# Patient Record
Sex: Female | Born: 1950 | Race: Black or African American | Hispanic: No | Smoking: Former smoker
Health system: Southern US, Community
[De-identification: ages and names within clinical notes are randomized; demographics above are authoritative.]

## PROBLEM LIST (undated history)

## (undated) DIAGNOSIS — K449 Diaphragmatic hernia without obstruction or gangrene: Secondary | ICD-10-CM

## (undated) DIAGNOSIS — R600 Localized edema: Secondary | ICD-10-CM

## (undated) DIAGNOSIS — I10 Essential (primary) hypertension: Secondary | ICD-10-CM

## (undated) DIAGNOSIS — K219 Gastro-esophageal reflux disease without esophagitis: Secondary | ICD-10-CM

## (undated) DIAGNOSIS — Z8679 Personal history of other diseases of the circulatory system: Secondary | ICD-10-CM

## (undated) DIAGNOSIS — G459 Transient cerebral ischemic attack, unspecified: Secondary | ICD-10-CM

## (undated) DIAGNOSIS — G4733 Obstructive sleep apnea (adult) (pediatric): Secondary | ICD-10-CM

## (undated) DIAGNOSIS — H269 Unspecified cataract: Secondary | ICD-10-CM

## (undated) DIAGNOSIS — T7840XA Allergy, unspecified, initial encounter: Secondary | ICD-10-CM

## (undated) DIAGNOSIS — N3289 Other specified disorders of bladder: Secondary | ICD-10-CM

## (undated) DIAGNOSIS — M1990 Unspecified osteoarthritis, unspecified site: Secondary | ICD-10-CM

## (undated) DIAGNOSIS — G4730 Sleep apnea, unspecified: Secondary | ICD-10-CM

## (undated) DIAGNOSIS — H409 Unspecified glaucoma: Secondary | ICD-10-CM

## (undated) DIAGNOSIS — H209 Unspecified iridocyclitis: Secondary | ICD-10-CM

## (undated) DIAGNOSIS — D649 Anemia, unspecified: Secondary | ICD-10-CM

## (undated) HISTORY — DX: Gastro-esophageal reflux disease without esophagitis: K21.9

## (undated) HISTORY — DX: Transient cerebral ischemic attack, unspecified: G45.9

## (undated) HISTORY — DX: Unspecified glaucoma: H40.9

## (undated) HISTORY — DX: Personal history of other diseases of the circulatory system: Z86.79

## (undated) HISTORY — DX: Essential (primary) hypertension: I10

## (undated) HISTORY — DX: Obstructive sleep apnea (adult) (pediatric): G47.33

## (undated) HISTORY — DX: Other specified disorders of bladder: N32.89

## (undated) HISTORY — PX: TUBAL LIGATION: SHX77

## (undated) HISTORY — DX: Sleep apnea, unspecified: G47.30

## (undated) HISTORY — DX: Unspecified osteoarthritis, unspecified site: M19.90

## (undated) HISTORY — DX: Allergy, unspecified, initial encounter: T78.40XA

## (undated) HISTORY — DX: Localized edema: R60.0

## (undated) HISTORY — DX: Diaphragmatic hernia without obstruction or gangrene: K44.9

## (undated) HISTORY — DX: Unspecified cataract: H26.9

## (undated) HISTORY — DX: Anemia, unspecified: D64.9

## (undated) HISTORY — PX: OTHER SURGICAL HISTORY: SHX169

---

## 2008-12-10 ENCOUNTER — Ambulatory Visit: Payer: Self-pay | Admitting: Family Medicine

## 2008-12-10 DIAGNOSIS — R0609 Other forms of dyspnea: Secondary | ICD-10-CM | POA: Insufficient documentation

## 2008-12-10 DIAGNOSIS — M1990 Unspecified osteoarthritis, unspecified site: Secondary | ICD-10-CM | POA: Insufficient documentation

## 2008-12-10 DIAGNOSIS — IMO0001 Reserved for inherently not codable concepts without codable children: Secondary | ICD-10-CM | POA: Insufficient documentation

## 2008-12-10 DIAGNOSIS — Z8679 Personal history of other diseases of the circulatory system: Secondary | ICD-10-CM

## 2008-12-10 DIAGNOSIS — R32 Unspecified urinary incontinence: Secondary | ICD-10-CM | POA: Insufficient documentation

## 2008-12-10 DIAGNOSIS — I10 Essential (primary) hypertension: Secondary | ICD-10-CM | POA: Insufficient documentation

## 2008-12-10 DIAGNOSIS — E1165 Type 2 diabetes mellitus with hyperglycemia: Secondary | ICD-10-CM

## 2008-12-10 DIAGNOSIS — R0989 Other specified symptoms and signs involving the circulatory and respiratory systems: Secondary | ICD-10-CM

## 2008-12-10 DIAGNOSIS — I7300 Raynaud's syndrome without gangrene: Secondary | ICD-10-CM | POA: Insufficient documentation

## 2008-12-10 HISTORY — DX: Personal history of other diseases of the circulatory system: Z86.79

## 2008-12-10 LAB — CONVERTED CEMR LAB: Creatinine,U: 300 mg/dL

## 2008-12-13 ENCOUNTER — Encounter: Payer: Self-pay | Admitting: Family Medicine

## 2008-12-14 LAB — CONVERTED CEMR LAB
AST: 15 units/L (ref 0–37)
Albumin: 4 g/dL (ref 3.5–5.2)
BUN: 23 mg/dL (ref 6–23)
CO2: 27 meq/L (ref 19–32)
Calcium: 9.8 mg/dL (ref 8.4–10.5)
Chloride: 99 meq/L (ref 96–112)
Cholesterol: 137 mg/dL (ref 0–200)
HDL: 46 mg/dL (ref >39)
Potassium: 4.6 meq/L (ref 3.5–5.3)
TSH: 2.232 microintl units/mL (ref 0.350–4.500)

## 2009-01-21 ENCOUNTER — Ambulatory Visit: Payer: Self-pay | Admitting: Family Medicine

## 2009-01-21 LAB — CONVERTED CEMR LAB: Hgb A1c MFr Bld: 9 %

## 2009-01-27 ENCOUNTER — Telehealth: Payer: Self-pay | Admitting: Family Medicine

## 2009-02-10 ENCOUNTER — Ambulatory Visit: Payer: Self-pay | Admitting: Family Medicine

## 2009-02-13 ENCOUNTER — Encounter: Payer: Self-pay | Admitting: Family Medicine

## 2009-02-15 ENCOUNTER — Telehealth: Payer: Self-pay | Admitting: Family Medicine

## 2009-02-21 ENCOUNTER — Telehealth: Payer: Self-pay | Admitting: Family Medicine

## 2009-03-14 ENCOUNTER — Ambulatory Visit: Payer: Self-pay | Admitting: Family Medicine

## 2009-03-14 DIAGNOSIS — K219 Gastro-esophageal reflux disease without esophagitis: Secondary | ICD-10-CM | POA: Insufficient documentation

## 2009-03-28 ENCOUNTER — Telehealth: Payer: Self-pay | Admitting: Family Medicine

## 2009-03-28 ENCOUNTER — Encounter: Payer: Self-pay | Admitting: Family Medicine

## 2009-04-13 ENCOUNTER — Ambulatory Visit: Payer: Self-pay | Admitting: Family Medicine

## 2009-04-13 DIAGNOSIS — G4730 Sleep apnea, unspecified: Secondary | ICD-10-CM | POA: Insufficient documentation

## 2009-04-25 ENCOUNTER — Ambulatory Visit: Payer: Self-pay | Admitting: Family Medicine

## 2009-05-22 ENCOUNTER — Encounter: Payer: Self-pay | Admitting: Family Medicine

## 2009-06-08 ENCOUNTER — Encounter: Payer: Self-pay | Admitting: Family Medicine

## 2009-07-24 ENCOUNTER — Encounter: Payer: Self-pay | Admitting: Family Medicine

## 2009-09-12 ENCOUNTER — Ambulatory Visit: Payer: Self-pay | Admitting: Family Medicine

## 2009-09-12 DIAGNOSIS — M79609 Pain in unspecified limb: Secondary | ICD-10-CM | POA: Insufficient documentation

## 2009-10-05 ENCOUNTER — Ambulatory Visit: Payer: Self-pay | Admitting: Family Medicine

## 2009-10-06 LAB — CONVERTED CEMR LAB
AST: 17 units/L (ref 0–37)
Albumin: 4.2 g/dL (ref 3.5–5.2)
Alkaline Phosphatase: 95 units/L (ref 39–117)
Chloride: 97 meq/L (ref 96–112)
Glucose, Bld: 257 mg/dL — ABNORMAL HIGH (ref 70–99)
Potassium: 3.8 meq/L (ref 3.5–5.3)
Sodium: 139 meq/L (ref 135–145)
Total Protein: 6.9 g/dL (ref 6.0–8.3)

## 2009-10-07 ENCOUNTER — Telehealth (INDEPENDENT_AMBULATORY_CARE_PROVIDER_SITE_OTHER): Payer: Self-pay | Admitting: *Deleted

## 2009-10-10 ENCOUNTER — Telehealth: Payer: Self-pay | Admitting: Family Medicine

## 2009-10-13 ENCOUNTER — Ambulatory Visit: Payer: Self-pay | Admitting: Family Medicine

## 2009-10-19 ENCOUNTER — Encounter: Admission: RE | Admit: 2009-10-19 | Discharge: 2009-12-08 | Payer: Self-pay | Admitting: Family Medicine

## 2009-10-27 ENCOUNTER — Encounter: Payer: Self-pay | Admitting: Family Medicine

## 2009-11-11 ENCOUNTER — Ambulatory Visit: Payer: Self-pay | Admitting: Family Medicine

## 2009-11-24 ENCOUNTER — Encounter: Payer: Self-pay | Admitting: Family Medicine

## 2009-11-29 ENCOUNTER — Encounter: Payer: Self-pay | Admitting: Family Medicine

## 2009-11-30 ENCOUNTER — Encounter: Payer: Self-pay | Admitting: Family Medicine

## 2009-12-16 ENCOUNTER — Encounter: Payer: Self-pay | Admitting: Family Medicine

## 2009-12-16 ENCOUNTER — Telehealth (INDEPENDENT_AMBULATORY_CARE_PROVIDER_SITE_OTHER): Payer: Self-pay | Admitting: *Deleted

## 2010-01-20 ENCOUNTER — Telehealth: Payer: Self-pay | Admitting: Family Medicine

## 2010-08-01 NOTE — Miscellaneous (Signed)
Summary: PT Initial Summary/[facility]  PT Initial Summary/[facility]   Imported By: Lanelle Bal 11/07/2009 08:10:16  _____________________________________________________________________  External Attachment:    Type:   Image     Comment:   External Document

## 2010-08-01 NOTE — Progress Notes (Signed)
Summary: Leg pain and sugar med  Phone Note Call from Patient Call back at Home Phone [phone]   Caller: Patient Call For: [name] MD Summary of Call: Pt calls and was told to stop the Victoza. Done internet search and said found that a side effect of the Onglyza is muscle weakness and wondered if could stop this and start back the Victoza because she says she is bed ridden due to the pain in her legs Initial call taken by: Kathlene November,  October 07, 2009 8:57 AM  Follow-up for Phone Call        Yes can stop the onglyza but needs to use an insuln pen.  Increase the lantus to 30 units.  Let me know if not starting to feel better by Monday.  Follow-up by: [name] MD,  October 07, 2009 9:01 AM  Additional Follow-up for Phone Call Additional follow up Details #1::        Pt aware.  Additional Follow-up by: Payton Spark CMA,  October 07, 2009 10:01 AM

## 2010-08-01 NOTE — Assessment & Plan Note (Signed)
Summary: f/u DM/ obesity   Vital Signs:  Patient profile:   60 year old female Height:      61.1 inches Weight:      363 pounds BMI:     68.61 O2 Sat:      99 % on Room air Pulse rate:   90 / minute BP sitting:   107 / 64  (left arm) Cuff size:   large  Vitals Entered By: Payton Spark CMA (Nov 11, 2009 1:08 PM)  O2 Flow:  Room air CC: F/U DM.    Primary Care Provider:  [name] MD  CC:  F/U DM. Marland Kitchen  History of Present Illness: 60 yo AAF presents for f/u DM and Physical therapy.  She is now able to stand at the sink longer and now has a shower chair.  She does have more muscle soreness.  She is still in PT for 3 more wks.  She admits to a poor diet.  She is on glyburide two times a day and metformin 2 x day.  She is using Lantus 33 units in the AM.  Her AM fastings are from 90-200 depending on what she has eaten.  She is not interested in seeing a nutritionist.  Her A1C and U micro are UTD.  she is due to schedule her mammogram and her eye exam.  Declined me setting these up.  She needs another set of FMLAs filled out.       Current Medications (verified): 1)  Glyburide Micronized 6 Mg Tabs (Glyburide Micronized) .... Take 1 Tablet By Mouth Two Times A Day 2)  Plavix 75 Mg Tabs (Clopidogrel Bisulfate) .... Take One Tablet By Mouth Once A Day 3)  Lisinopril 10 Mg Tabs (Lisinopril) .... Take One Tablet By Mouth Daily 4)  Metformin Hcl 1000 Mg Tabs (Metformin Hcl) .... Take One Tablet By Mouth Twice A Day 5)  Lipitor 10 Mg Tabs (Atorvastatin Calcium) .... Take One Tablet By Mouth At Bedtime 6)  Aspirin 325 Mg Tabs (Aspirin) .... Take One Tablet By Mouth Oncea Day 7)  Iron 325 (65 Fe) Mg Tabs (Ferrous Sulfate) .... Take One Tablet By Mouth Once Ad Ay 8)  Fish Oil 1000 Mg Caps (Omega-3 Fatty Acids) .... Take One Tablet By Mouth Once A Day 9)  Zyrtec Allergy 10 Mg Tabs (Cetirizine Hcl) .... Take One Tablet By Mouth Once Ad Ay 10)  Indapamide 2.5 Mg Tabs (Indapamide) ....  Take One Tablet By Mouth Once Ad Ay 11)  Needles For [employer]. .... #10 12)  Dexilant 60 Mg Cpdr (Dexlansoprazole) .... Take 1 Tablet By Mouth Once A Day 13)  Cyclobenzaprine Hcl 10 Mg Tabs (Cyclobenzaprine Hcl) .... 1/2 - 1 Tab By Mouth Up To 3 X A Day For Muscle Spasm. 14)  Lantus Solostar 100 Unit/ml Soln (Insulin Glargine) .... 35 Units Daily [place]  Allergies (verified): 1)  Actos (Pioglitazone Hcl)  Past History:  Past Medical History: Reviewed history from 04/13/2009 and no changes required. 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  - has a haital hernia and delayed gastric empyting.   Reynauds. Pedal edema.   Hx of TIA x 2.  - Sees Dr. 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   hx C5 disc disease - occ numbness tingling in both arms.  OSA - CPAP .   Social History: Reviewed history from 04/13/2009 and no changes required. 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 Married  to Melba with 2 adult kids.  Foster parent.  Former Smoker Alcohol use-yes Drug use-yes Regular exercise-no  Review of Systems      See HPI  Physical Exam  General:  morbidly obese AAF, pleasant.  ambulating with cane Head:  normocephalic and atraumatic.   Eyes:  pupils equal, pupils round, and pupils reactive to light.   Mouth:  no gingival abnormalities and pharynx pink and moist.   Neck:  no masses.   Lungs:  Normal respiratory effort, chest expands symmetrically. Lungs are clear to auscultation, no crackles or wheezes. Heart:  Normal rate and regular rhythm. S1 and S2 normal without gallop, murmur, click, rub or other extra sounds. Extremities:  1+ non pitting ankle edema bilat Skin:  color normal.   Psych:  good eye contact, not anxious appearing, and flat affect.     Impression & Recommendations:  Problem # 1:  OBESITY, MORBID (ICD-278.01) BMI 68 c/w super morbid obesity. She is regaining a little more mobility with PT, so will continue  for another 3 wks.  Will complete her FMLA to keep her out for another 4 wks. She has co-morbidities of HTN, OSA, TIA, T2DM, high cholesterol, and joint pain all weight related. Pt appears to be in the pre-contemplative phase of behavioral change when I offered nutrition referral and dietary changes.  Problem # 2:  DIABETES MELLITUS, UNCONTROLLED (ICD-250.02) She is not due for an A1C or Umicro today.  Labs are UTD.  Changed Lantus in EMR from 35 to 33 units/ day and continue current oral meds.  Fluctuations in AM glucose are related to dietary non adherance.  F/U in 2 mos and will update her fasting labs/ A1C. Her updated medication list for this problem includes:    Glyburide Micronized 6 Mg Tabs (Glyburide micronized) .Marland Kitchen... Take 1 tablet by mouth two times a day    Lisinopril 10 Mg Tabs (Lisinopril) .Marland Kitchen... Take one tablet by mouth daily    Metformin Hcl 1000 Mg Tabs (Metformin hcl) .Marland Kitchen... Take one tablet by mouth twice a day    Aspirin 325 Mg Tabs (Aspirin) .Marland Kitchen... Take one tablet by mouth oncea day    Lantus Solostar 100 Unit/ml Soln (Insulin glargine) .Marland Kitchen... 33  units [place] injection once a day  Labs Reviewed: Creat: 0.73 (10/05/2009)   Microalbumin: 10 (12/10/2008) Reviewed HgBA1c results: 7.5 (09/12/2009)  7.5 (04/25/2009)  Complete Medication List: 1)  Glyburide Micronized 6 Mg Tabs (Glyburide micronized) .... Take 1 tablet by mouth two times a day 2)  Plavix 75 Mg Tabs (Clopidogrel bisulfate) .... Take one tablet by mouth once a day 3)  Lisinopril 10 Mg Tabs (Lisinopril) .... Take one tablet by mouth daily 4)  Metformin Hcl 1000 Mg Tabs (Metformin hcl) .... Take one tablet by mouth twice a day 5)  Lipitor 10 Mg Tabs (Atorvastatin calcium) .... Take one tablet by mouth at bedtime 6)  Aspirin 325 Mg Tabs (Aspirin) .... Take one tablet by mouth oncea day 7)  Iron 325 (65 Fe) Mg Tabs (Ferrous sulfate) .... Take one tablet by mouth once ad ay 8)  Fish Oil 1000 Mg Caps (Omega-3 fatty acids) ....  Take one tablet by mouth once a day 9)  Zyrtec Allergy 10 Mg Tabs (Cetirizine hcl) .... Take one tablet by mouth once ad ay 10)  Indapamide 2.5 Mg Tabs (Indapamide) .... Take one tablet by mouth once ad ay 11)  Needles For [employer].  .... #10 12)  Dexilant 60 Mg Cpdr (Dexlansoprazole) .Marland KitchenMarland KitchenMarland Kitchen  Take 1 tablet by mouth once a day 13)  Cyclobenzaprine Hcl 10 Mg Tabs (Cyclobenzaprine hcl) .... 1/2 - 1 tab by mouth up to 3 x a day for muscle spasm. 14)  Lantus Solostar 100 Unit/ml Soln (Insulin glargine) .... 33  units [place] injection once a day  Patient Instructions: 1)  I will send in your FMLA paper next wk for 4 more wks. 2)  Keep up with physical therapy and work on diabetic diet. 3)  AM fasting sugar goal is 80-120.  2 hr after dinner goal is <150. 4)  Call for your mammogram and diabetic eye exam. 5)  F/U Diabetes/ leg pain and fasting labs in 2 mos.

## 2010-08-01 NOTE — Progress Notes (Signed)
Summary: Update  Phone Note Call from Patient Call back at Home Phone [phone]   Caller: Patient Call For: [name] MD Summary of Call: Pt wants to know how long it will be before she gets her strenghth back in legs- is getting better but only 50% better.Still taking the muscle relaxers. Has appt Thursday morning withyou. The Lantus she is on her FBS 170 this morning Initial call taken by: Kathlene November,  October 10, 2009 2:40 PM  Follow-up for Phone Call        May take another week to get better. Increase Lantus to 35 units  Follow-up by: [name] MD,  October 10, 2009 3:06 PM  Additional Follow-up for Phone Call Additional follow up Details #1::        Pt notiifeed Additional Follow-up by: Kathlene November,  October 10, 2009 3:48 PM

## 2010-08-01 NOTE — Miscellaneous (Signed)
Summary: PT Discharge/[facility]  PT Discharge/[facility]   Imported By: Lanelle Bal 02/24/2010 11:20:36  _____________________________________________________________________  External Attachment:    Type:   Image     Comment:   External Document

## 2010-08-01 NOTE — Progress Notes (Signed)
Summary: lantus pen needles  Phone Note Call from Patient Call back at Home Phone [phone]   Caller: Patient Call For: Natalie Wright Summary of Call: Pt calls and states she needs the needles for the Lantus pen- small quantity because she is waiting on her mail order to come in but used her last one this morning. Needs this sent to [facility] in [place] Initial call taken by: Kathlene November,  January 20, 2010 9:32 AM    New/Updated Medications: BD PEN NEEDLE ULTRAFINE 29G X 12.7MM MISC (INSULIN PEN NEEDLE) use daily as directed Prescriptions: BD PEN NEEDLE ULTRAFINE 29G X 12.7MM MISC (INSULIN PEN NEEDLE) use daily as directed  #14 x 0   Entered and Authorized by:   Seymour Bars DO   Signed by:   Seymour Bars DO on 01/20/2010   Method used:   Electronically to        [facility]* (retail)       [address] [place], [place]  [address]       Ph: [phone]       Fax: [phone]   RxID:   [id number]

## 2010-08-01 NOTE — Letter (Signed)
Summary: [facility]  [facility]   Imported By: Lanelle Bal 12/06/2009 12:03:45  _____________________________________________________________________  External Attachment:    Type:   Image     Comment:   External Document  Appended Document: [facility]   Diabetes Management History:      The patient is a 60 years old female who comes in for evaluation of DM Type 2.  She is (or has been) enrolled in the "Diabetic Education Program".  She is checking home blood sugars.  She says that she is not exercising regularly.    Diabetes Management Exam:    Eye Exam:       Eye Exam done elsewhere          Date: 11/29/2009          Results: normal          Done by: [facility]

## 2010-08-01 NOTE — Miscellaneous (Signed)
Summary: mammogram normal  Clinical Lists Changes  Observations: Added new observation of MAMMO DUE: 12/2010 (11/24/2009 11:57) Added new observation of MAMMRECACT: Screening mammogram in 1 year.    (11/23/2009 11:57) Added new observation of MAMMOGRAM: Location: [facility].   No specific mammographic evidence of malignancy.  No significant changes compared to previous study.  Assessment: BIRADS 1.  (11/23/2009 11:57)      Mammogram  Procedure date:  11/23/2009  Findings:      Location: [facility].   No specific mammographic evidence of malignancy.  No significant changes compared to previous study.  Assessment: BIRADS 1.   Comments:      Screening mammogram in 1 year.     Procedures Next Due Date:    Mammogram: 12/2010   Mammogram  Procedure date:  11/23/2009  Findings:      Location: [facility].   No specific mammographic evidence of malignancy.  No significant changes compared to previous study.  Assessment: BIRADS 1.   Comments:      Screening mammogram in 1 year.     Procedures Next Due Date:    Mammogram: 12/2010

## 2010-08-01 NOTE — Progress Notes (Signed)
Summary: Work Statistician Note Call from Patient   Caller: Patient Summary of Call: Dr. Cathey Endow filled out FMLA papers for Pt to return to work [employer] 12/12/09. Since returning to work, Pt's HR dept is requiring an additional letter stating Pt is released back to work effective 12/12/09. Please advise. Initial call taken by: Payton Spark CMA,  December 16, 2009 12:59 PM  Follow-up for Phone Call        will write note explaining that per FMLA papers pt is cleared to return to work.  since there is no copy of paperwork for me to refer to in order to elaborate that will have to be sufficient (unless they want to wait for Dr Cathey Endow) Follow-up by: Neena Rhymes MD,  December 16, 2009 1:07 PM  Additional Follow-up for Phone Call Additional follow up Details #1::        Letter faxed Additional Follow-up by: Payton Spark CMA,  December 16, 2009 1:15 PM

## 2010-08-01 NOTE — Miscellaneous (Signed)
Summary: PT Progress Note/[facility]  PT Progress Note/[facility]   Imported By: Lanelle Bal 11/21/2009 12:55:22  _____________________________________________________________________  External Attachment:    Type:   Image     Comment:   External Document

## 2010-08-01 NOTE — Assessment & Plan Note (Signed)
Summary: Leg pain, DM   Vital Signs:  Patient profile:   60 year old female Height:      61.1 inches Weight:      360 pounds Pulse rate:   87 / minute BP sitting:   127 / 64  (left arm) Cuff size:   large  Vitals Entered By: Kathlene November (October 13, 2009 1:38 PM) CC: continues to have leg pain is better per pt   Primary Care Provider:  [name] MD  CC:  continues to have leg pain is better per pt.  History of Present Illness: Overall her legs feel 60% better. Says it got so bad she was unable to get out of bed but now can get out of bed but very painful and taxing.  No inital injury or trauma.  Did stop her onglyza and victoza which both have rare but potential SE of muscle aches.   Pain is mostly in the hips.  In the right leg feels like the muscles are contracted.    Current Medications (verified): 1)  Glyburide Micronized 6 Mg Tabs (Glyburide Micronized) .... Take 1 Tablet By Mouth Two Times A Day 2)  Plavix 75 Mg Tabs (Clopidogrel Bisulfate) .... Take One Tablet By Mouth Once A Day 3)  Lisinopril 10 Mg Tabs (Lisinopril) .... Take One Tablet By Mouth Daily 4)  Metformin Hcl 1000 Mg Tabs (Metformin Hcl) .... Take One Tablet By Mouth Twice A Day 5)  Lipitor 10 Mg Tabs (Atorvastatin Calcium) .... Take One Tablet By Mouth At Bedtime 6)  Aspirin 325 Mg Tabs (Aspirin) .... Take One Tablet By Mouth Oncea Day 7)  Iron 325 (65 Fe) Mg Tabs (Ferrous Sulfate) .... Take One Tablet By Mouth Once Ad Ay 8)  Fish Oil 1000 Mg Caps (Omega-3 Fatty Acids) .... Take One Tablet By Mouth Once A Day 9)  Zyrtec Allergy 10 Mg Tabs (Cetirizine Hcl) .... Take One Tablet By Mouth Once Ad Ay 10)  Indapamide 2.5 Mg Tabs (Indapamide) .... Take One Tablet By Mouth Once Ad Ay 11)  Needles For [employer]. .... #10 12)  Dexilant 60 Mg Cpdr (Dexlansoprazole) .... Take 1 Tablet By Mouth Once A Day 13)  Cyclobenzaprine Hcl 10 Mg Tabs (Cyclobenzaprine Hcl) .... 1/2 - 1 Tab By Mouth Up To 3 X A Day For Muscle  Spasm.  Allergies (verified): 1)  Actos (Pioglitazone Hcl)  Comments:  Nurse/Medical Assistant: The patient's medications and allergies were reviewed with the patient and were updated in the Medication and Allergy Lists. Kathlene November (October 13, 2009 1:39 PM)  Physical Exam  General:  Well-developed,well-nourished,in no acute distress; alert,appropriate and cooperative throughout examination Ears:  Left TM and canal are clear.  Skin:  no rashes.   Psych:  Cognition and judgment appear intact. Alert and cooperative with normal attention span and concentration. No apparent delusions, illusions, hallucinations   Impression & Recommendations:  Problem # 1:  LEG PAIN (ICD-729.5) Will refer to PT to help stretch out the legs. Will also have her hld her lipitor even though has been on this for 2 years and has tolerated itwell. If better in 1-2 weeks then will have her restart her lipitor and see if sxs recure. In the meantime she is 60% improved and is making progress.  F/U if not better in 1-2 weeks.  Orders: Physical Therapy Referral (PT)  Problem # 2:  DIABETES MELLITUS, UNCONTROLLED (ICD-250.02) Doing well on teh Lantus daily. Her fasting sugar was 85 this AM. Decrease  dose to 32-33 units daily and montiro carefully. F/U in one month.  90 days supply sent o [facility].  The following medications were removed from the medication list:    Victoza 18 Mg/66ml Soln (Liraglutide) .Marland Kitchen... 1.2mg  subcutaneously once daily    Onglyza 5 Mg Tabs (Saxagliptin hcl) .Marland Kitchen... Take 1 tablet by mouth once a day Her updated medication list for this problem includes:    Glyburide Micronized 6 Mg Tabs (Glyburide micronized) .Marland Kitchen... Take 1 tablet by mouth two times a day    Lisinopril 10 Mg Tabs (Lisinopril) .Marland Kitchen... Take one tablet by mouth daily    Metformin Hcl 1000 Mg Tabs (Metformin hcl) .Marland Kitchen... Take one tablet by mouth twice a day    Aspirin 325 Mg Tabs (Aspirin) .Marland Kitchen... Take one tablet by mouth oncea day    Lantus  Solostar 100 Unit/ml Soln (Insulin glargine) .Marland KitchenMarland KitchenMarland KitchenMarland Kitchen 35 units daily [place]  Complete Medication List: 1)  Glyburide Micronized 6 Mg Tabs (Glyburide micronized) .... Take 1 tablet by mouth two times a day 2)  Plavix 75 Mg Tabs (Clopidogrel bisulfate) .... Take one tablet by mouth once a day 3)  Lisinopril 10 Mg Tabs (Lisinopril) .... Take one tablet by mouth daily 4)  Metformin Hcl 1000 Mg Tabs (Metformin hcl) .... Take one tablet by mouth twice a day 5)  Lipitor 10 Mg Tabs (Atorvastatin calcium) .... Take one tablet by mouth at bedtime 6)  Aspirin 325 Mg Tabs (Aspirin) .... Take one tablet by mouth oncea day 7)  Iron 325 (65 Fe) Mg Tabs (Ferrous sulfate) .... Take one tablet by mouth once ad ay 8)  Fish Oil 1000 Mg Caps (Omega-3 fatty acids) .... Take one tablet by mouth once a day 9)  Zyrtec Allergy 10 Mg Tabs (Cetirizine hcl) .... Take one tablet by mouth once ad ay 10)  Indapamide 2.5 Mg Tabs (Indapamide) .... Take one tablet by mouth once ad ay 11)  Needles For [employer].  .... #10 12)  Dexilant 60 Mg Cpdr (Dexlansoprazole) .... Take 1 tablet by mouth once a day 13)  Cyclobenzaprine Hcl 10 Mg Tabs (Cyclobenzaprine hcl) .... 1/2 - 1 tab by mouth up to 3 x a day for muscle spasm. 14)  Lantus Solostar 100 Unit/ml Soln (Insulin glargine) .... 35 units daily [place]  Patient Instructions: 1)  Fasting sugars should be between 80-130 fasting. If 2 hours after meal then should be under 160 2)  Drop the lantus back down to 32 or 33 units.   3)  Hold the lipitor for now. If better in1 -2 weeks then can restart the Lipitor and see how you do.  4)  Please schedule a follow-up appointment in 1 month for diabetes Prescriptions: LANTUS SOLOSTAR 100 UNIT/ML SOLN (INSULIN GLARGINE) 35 units daily [place]  #90 day suppl x 0   Entered and Authorized by:   [name] MD   Signed by:   [name] MD on 10/13/2009   Method used:   Electronically to        [facility]* (mail-order)             ,           Ph: [phone]       Fax: [phone]   RxID:   [id number]

## 2010-08-01 NOTE — Assessment & Plan Note (Signed)
Summary: DIABETIC FU,  right calf pain.    Vital Signs:  Patient profile:   60 year old female Height:      61.1 inches Weight:      369 pounds BMI:     69.75 O2 Sat:      100 % on Room air Pulse rate:   83 / minute BP sitting:   145 / 73  (left arm) Cuff size:   large  Vitals Entered By: Payton Spark CMA (September 12, 2009 8:45 AM)  O2 Flow:  Room air  Serial Vital Signs/Assessments:  Time      Position  BP       Pulse  Resp  Temp     By 9:44 AM             136/73                         [name] MD  CC: F/U DM   Primary Care Provider:  [name] MD  CC:  F/U DM.  History of Present Illness: Has lost 4 pounds. Doing well. exercising more regularly. Has not had her eye exam this year.   pain in the right lower leg for about 3 weeks. First started one niht while asleep. Was sudden and sharp and actually screamed out. Lasted about 10 seconds.  Comes and goes every 2-3 days. Sometimes when standing but often happens when asleep. NO swelling. Feels deep in the calf and it tender.    Diabetes Management History:      The patient is a 60 years old female who comes in for evaluation of DM Type 2.  She is (or has been) enrolled in the "Diabetic Education Program".  She states understanding of dietary principles and is following her diet appropriately.  She is checking home blood sugars.  She says that she is not exercising regularly.        Hypoglycemic symptoms are not occurring.  Other comments include: Has  losst another 4 lbs. Has been exercising 5 days a week for 30 min each.  .        There are no symptoms to suggest diabetic complications.  No changes have been made to her treatment plan since last visit.    Current Medications (verified): 1)  Glyburide Micronized 6 Mg Tabs (Glyburide Micronized) .... Take 1 Tablet By Mouth Two Times A Day 2)  Plavix 75 Mg Tabs (Clopidogrel Bisulfate) .... Take One Tablet By Mouth Once A Day 3)  Lisinopril 10 Mg Tabs  (Lisinopril) .... Take One Tablet By Mouth Daily 4)  Metformin Hcl 1000 Mg Tabs (Metformin Hcl) .... Take One Tablet By Mouth Twice A Day 5)  Lipitor 10 Mg Tabs (Atorvastatin Calcium) .... Take One Tablet By Mouth At Bedtime 6)  Aspirin 325 Mg Tabs (Aspirin) .... Take One Tablet By Mouth Oncea Day 7)  Iron 325 (65 Fe) Mg Tabs (Ferrous Sulfate) .... Take One Tablet By Mouth Once Ad Ay 8)  Fish Oil 1000 Mg Caps (Omega-3 Fatty Acids) .... Take One Tablet By Mouth Once A Day 9)  Zyrtec Allergy 10 Mg Tabs (Cetirizine Hcl) .... Take One Tablet By Mouth Once Ad Ay 10)  Lotrisone 1-0.05 % Crea (Clotrimazole-Betamethasone) .... Apply Two Times A Day 11)  Indapamide 2.5 Mg Tabs (Indapamide) .... Take One Tablet By Mouth Once Ad Ay 12)  Victoza 18 Mg/19ml Soln (Liraglutide) .... 1.2mg  Subcutaneously Once Daily 13)  Needles  For [employer]. .... #10 14)  Avandia 4 Mg Tabs (Rosiglitazone Maleate) .... 1/2 Tab By Mouth Two Times A Day 15)  Dexilant 60 Mg Cpdr (Dexlansoprazole) .... Take 1 Tablet By Mouth Once A Day  Allergies (verified): 1)  Actos (Pioglitazone Hcl)  Family History: Reviewed history from 04/25/2009 and no changes required. Father with alcoholism Mother with DM, HTN, glaucoma Father with emphysema, CHF Mother with bone ca Sister with ?utereine cancer  Social History: Reviewed history from 04/13/2009 and no changes required. 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  Foster parent.  Former Smoker Alcohol use-yes Drug use-yes Regular exercise-no  Physical Exam  General:  Well-developed,well-nourished,in no acute distress; alert,appropriate and cooperative throughout examination Lungs:  Normal respiratory effort, chest expands symmetrically. Lungs are clear to auscultation, no crackles or wheezes. Heart:  Normal rate and regular rhythm. S1 and S2 normal without gallop, murmur, click, rub or other extra sounds. Msk:  Right calf is mildly  tender near the base. Right ankle with NROM.  No edema or rash.  Pulses:  Right DP and post tib is 2+.  Skin:  no rashes.   Psych:  Cognition and judgment appear intact. Alert and cooperative with normal attention span and concentration. No apparent delusions, illusions, hallucinations   Impression & Recommendations:  Problem # 1:  DIABETES MELLITUS, UNCONTROLLED (ICD-250.02) Assessment Deteriorated Reminded her to get her eye exam. Will stop the avandia (warned of potential SE and change to onlyza). pt still wants to hold off on insujlin.  Monitor for lows since will continue her sulfonylurea. Call if any concerns. Her A1C was unchanged to day.  The following medications were removed from the medication list:    Avandia 4 Mg Tabs (Rosiglitazone maleate) .Marland Kitchen... 1/2 tab by mouth two times a day Her updated medication list for this problem includes:    Glyburide Micronized 6 Mg Tabs (Glyburide micronized) .Marland Kitchen... Take 1 tablet by mouth two times a day    Lisinopril 10 Mg Tabs (Lisinopril) .Marland Kitchen... Take one tablet by mouth daily    Metformin Hcl 1000 Mg Tabs (Metformin hcl) .Marland Kitchen... Take one tablet by mouth twice a day    Aspirin 325 Mg Tabs (Aspirin) .Marland Kitchen... Take one tablet by mouth oncea day    Victoza 18 Mg/69ml Soln (Liraglutide) .Marland Kitchen... 1.2mg  subcutaneously once daily    Onglyza 5 Mg Tabs (Saxagliptin hcl) .Marland Kitchen... Take 1 tablet by mouth once a day  Orders: Fingerstick (36416) Hemoglobin A1C (83036)  Problem # 2:  ESSENTIAL HYPERTENSION, BENIGN (ICD-401.1) Repeat BP looks great.  Her updated medication list for this problem includes:    Lisinopril 10 Mg Tabs (Lisinopril) .Marland Kitchen... Take one tablet by mouth daily    Indapamide 2.5 Mg Tabs (Indapamide) .Marland Kitchen... Take one tablet by mouth once ad ay  Problem # 3:  LEG PAIN, RIGHT (ICD-729.5) Assessment: New Unlikely DVT based on exam today but will get dopplers to rule it out since her exam is a little difficult.  IN meantime can use ACE wrap for comfort and a  baby ASA once a day for now.  [phone] cell.   Orders: T-*Unlisted Diagnostic X-ray test/procedure (45409)  Complete Medication List: 1)  Glyburide Micronized 6 Mg Tabs (Glyburide micronized) .... Take 1 tablet by mouth two times a day 2)  Plavix 75 Mg Tabs (Clopidogrel bisulfate) .... Take one tablet by mouth once a day 3)  Lisinopril 10 Mg Tabs (Lisinopril) .... Take one tablet by mouth  daily 4)  Metformin Hcl 1000 Mg Tabs (Metformin hcl) .... Take one tablet by mouth twice a day 5)  Lipitor 10 Mg Tabs (Atorvastatin calcium) .... Take one tablet by mouth at bedtime 6)  Aspirin 325 Mg Tabs (Aspirin) .... Take one tablet by mouth oncea day 7)  Iron 325 (65 Fe) Mg Tabs (Ferrous sulfate) .... Take one tablet by mouth once ad ay 8)  Fish Oil 1000 Mg Caps (Omega-3 fatty acids) .... Take one tablet by mouth once a day 9)  Zyrtec Allergy 10 Mg Tabs (Cetirizine hcl) .... Take one tablet by mouth once ad ay 10)  Lotrisone 1-0.05 % Crea (Clotrimazole-betamethasone) .... Apply two times a day 11)  Indapamide 2.5 Mg Tabs (Indapamide) .... Take one tablet by mouth once ad ay 12)  Victoza 18 Mg/58ml Soln (Liraglutide) .... 1.2mg  subcutaneously once daily 13)  Needles For [employer].  .... #10 14)  Dexilant 60 Mg Cpdr (Dexlansoprazole) .... Take 1 tablet by mouth once a day 15)  Onglyza 5 Mg Tabs (Saxagliptin hcl) .... Take 1 tablet by mouth once a day  Patient Instructions: 1)  Please schedule a follow-up appointment in 1 month.  2)  Stop the avandia and start the onglyza once a day.  3)  Call if having lows.  Continue the metfromin, glyburide and victoza.  4)  Remeber to get your yearly eye exam.  Prescriptions: ONGLYZA 5 MG TABS (SAXAGLIPTIN HCL) Take 1 tablet by mouth once a day  #30 x 1   Entered and Authorized by:   [name] MD   Signed by:   [name] MD on 09/12/2009   Method used:   Electronically to        [facility]* (retail)       [address] [place], [place]  [address]       Ph: [phone]       Fax: [phone]   RxID:   [id number]     Appended Document: DIABETIC FU,  right calf pain.      Allergies: 1)  Actos (Pioglitazone Hcl)   Complete Medication List: 1)  Glyburide Micronized 6 Mg Tabs (Glyburide micronized) .... Take 1 tablet by mouth two times a day 2)  Plavix 75 Mg Tabs (Clopidogrel bisulfate) .... Take one tablet by mouth once a day 3)  Lisinopril 10 Mg Tabs (Lisinopril) .... Take one tablet by mouth daily 4)  Metformin Hcl 1000 Mg Tabs (Metformin hcl) .... Take one tablet by mouth twice a day 5)  Lipitor 10 Mg Tabs (Atorvastatin calcium) .... Take one tablet by mouth at bedtime 6)  Aspirin 325 Mg Tabs (Aspirin) .... Take one tablet by mouth oncea day 7)  Iron 325 (65 Fe) Mg Tabs (Ferrous sulfate) .... Take one tablet by mouth once ad ay 8)  Fish Oil 1000 Mg Caps (Omega-3 fatty acids) .... Take one tablet by mouth once a day 9)  Zyrtec Allergy 10 Mg Tabs (Cetirizine hcl) .... Take one tablet by mouth once ad ay 10)  Lotrisone 1-0.05 % Crea (Clotrimazole-betamethasone) .... Apply two times a day 11)  Indapamide 2.5 Mg Tabs (Indapamide) .... Take one tablet by mouth once ad ay 12)  Victoza 18 Mg/46ml Soln (Liraglutide) .... 1.2mg  subcutaneously once daily 13)  Needles For [employer].  .... #10 14)  Dexilant 60 Mg Cpdr (Dexlansoprazole) .... Take 1 tablet by mouth once a day 15)  Onglyza 5 Mg Tabs (Saxagliptin  hcl) .... Take 1 tablet by mouth once a day   Laboratory Results   Blood Tests     HGBA1C: 7.5%   (Normal Range: Non-Diabetic - 3-6%   Control Diabetic - 6-8%)     Appended Document: DIABETIC FU,  right calf pain.  Called pt: LM report neg for DVT. REcommend compression and NSAIDs. If not better in next couple of weeks then consider xray of the lower leg.

## 2010-08-01 NOTE — Assessment & Plan Note (Signed)
Summary: Leg pain, DM   Vital Signs:  Patient profile:   60 year old female Height:      61.1 inches Weight:      363 pounds Temp:     97.6 degrees F oral Pulse rate:   76 / minute BP sitting:   135 / 63  (left arm) Cuff size:   large  Vitals Entered By: Kathlene November (October 05, 2009 10:07 AM) CC: bilateral leg pain- starts at hips and goes down to calves. Feels like muscles contracting and pulling for over 1 week now   Primary Care Provider:  [name] MD  CC:  bilateral leg pain- starts at hips and goes down to calves. Feels like muscles contracting and pulling for over 1 week now.  History of Present Illness: bilateral leg pain- starts at hips and goes down to calves. Feels like muscles contracting and pulling for over 1 week now.  Pain is going down the back of teh legs. It is a "soreness" adn like a congtracted muscles. When stretches out it is worse. Worse in the morning when gets out of bed.  Takes about 1 to 1.5 hours until feels better.  Started a week ago.  Trying to take Tyelnol ARthritis.  Never happened before. BOth legs. No nubmness or tingling. No alleviating sxs.    DM Tolerating the onglyza well and has lost a couple more pounds but her sugars are still running in the 200s.   Current Medications (verified): 1)  Glyburide Micronized 6 Mg Tabs (Glyburide Micronized) .... Take 1 Tablet By Mouth Two Times A Day 2)  Plavix 75 Mg Tabs (Clopidogrel Bisulfate) .... Take One Tablet By Mouth Once A Day 3)  Lisinopril 10 Mg Tabs (Lisinopril) .... Take One Tablet By Mouth Daily 4)  Metformin Hcl 1000 Mg Tabs (Metformin Hcl) .... Take One Tablet By Mouth Twice A Day 5)  Lipitor 10 Mg Tabs (Atorvastatin Calcium) .... Take One Tablet By Mouth At Bedtime 6)  Aspirin 325 Mg Tabs (Aspirin) .... Take One Tablet By Mouth Oncea Day 7)  Iron 325 (65 Fe) Mg Tabs (Ferrous Sulfate) .... Take One Tablet By Mouth Once Ad Ay 8)  Fish Oil 1000 Mg Caps (Omega-3 Fatty Acids) .... Take One  Tablet By Mouth Once A Day 9)  Zyrtec Allergy 10 Mg Tabs (Cetirizine Hcl) .... Take One Tablet By Mouth Once Ad Ay 10)  Indapamide 2.5 Mg Tabs (Indapamide) .... Take One Tablet By Mouth Once Ad Ay 11)  Victoza 18 Mg/10ml Soln (Liraglutide) .... 1.2mg  Subcutaneously Once Daily 12)  Needles For [employer]. .... #10 13)  Dexilant 60 Mg Cpdr (Dexlansoprazole) .... Take 1 Tablet By Mouth Once A Day 14)  Onglyza 5 Mg Tabs (Saxagliptin Hcl) .... Take 1 Tablet By Mouth Once A Day  Allergies (verified): 1)  Actos (Pioglitazone Hcl)  Comments:  Nurse/Medical Assistant: The patient's medications and allergies were reviewed with the patient and were updated in the Medication and Allergy Lists. Kathlene November (October 05, 2009 10:09 AM)  Social History: Reviewed history from 04/13/2009 and no changes required. 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  Former Smoker Alcohol use-yes Drug use-yes Regular exercise-no  Physical Exam  General:  Well-developed,well-nourished,in no acute distress; alert,appropriate and cooperative throughout examination Head:  Normocephalic and atraumatic without obvious abnormalities. No apparent alopecia or balding. Msk:  Legs with sterngth 5/5in the hips, knees, and ankles bilat.  NROM limited by body habitus at the hips, knees and ankles. Not tender with muscle squeeze.  Neurologic:  Patellar and achilles reflex 1+ bilat.  Skin:  no rashes.   Psych:  Cognition and judgment appear intact. Alert and cooperative with normal attention span and concentration. No apparent delusions, illusions, hallucinations   Impression & Recommendations:  Problem # 1:  LEG PAIN (ICD-729.5) Discussed potential DX. She is very sore with movement so I want to make sure not an imblance with her potassium and also check sed rate and CK to make sure no muscle breakdown. Also consider it may be the statin causing her pain.      Orders: T-Comprehensive Metabolic Panel [id number]) T-CK Total [id number]) T-Sed Rate (Automated) ([id number])  Problem # 2:  DIABETES MELLITUS, UNCONTROLLED (ICD-250.02) Sugars still running in the 200s. Start Lantus 15 units daily and keep f/u next week for diabetes f/u and bring in sugar log with her.   Her updated medication list for this problem includes:    Glyburide Micronized 6 Mg Tabs (Glyburide micronized) .Marland Kitchen... Take 1 tablet by mouth two times a day    Lisinopril 10 Mg Tabs (Lisinopril) .Marland Kitchen... Take one tablet by mouth daily    Metformin Hcl 1000 Mg Tabs (Metformin hcl) .Marland Kitchen... Take one tablet by mouth twice a day    Aspirin 325 Mg Tabs (Aspirin) .Marland Kitchen... Take one tablet by mouth oncea day    Victoza 18 Mg/25ml Soln (Liraglutide) .Marland Kitchen... 1.2mg  subcutaneously once daily    Onglyza 5 Mg Tabs (Saxagliptin hcl) .Marland Kitchen... Take 1 tablet by mouth once a day  Complete Medication List: 1)  Glyburide Micronized 6 Mg Tabs (Glyburide micronized) .... Take 1 tablet by mouth two times a day 2)  Plavix 75 Mg Tabs (Clopidogrel bisulfate) .... Take one tablet by mouth once a day 3)  Lisinopril 10 Mg Tabs (Lisinopril) .... Take one tablet by mouth daily 4)  Metformin Hcl 1000 Mg Tabs (Metformin hcl) .... Take one tablet by mouth twice a day 5)  Lipitor 10 Mg Tabs (Atorvastatin calcium) .... Take one tablet by mouth at bedtime 6)  Aspirin 325 Mg Tabs (Aspirin) .... Take one tablet by mouth oncea day 7)  Iron 325 (65 Fe) Mg Tabs (Ferrous sulfate) .... Take one tablet by mouth once ad ay 8)  Fish Oil 1000 Mg Caps (Omega-3 fatty acids) .... Take one tablet by mouth once a day 9)  Zyrtec Allergy 10 Mg Tabs (Cetirizine hcl) .... Take one tablet by mouth once ad ay 10)  Indapamide 2.5 Mg Tabs (Indapamide) .... Take one tablet by mouth once ad ay 11)  Victoza 18 Mg/72ml Soln (Liraglutide) .... 1.2mg  subcutaneously once daily 12)  Needles For [employer].  .... #10 13)  Dexilant 60 Mg Cpdr (Dexlansoprazole)  .... Take 1 tablet by mouth once a day 14)  Onglyza 5 Mg Tabs (Saxagliptin hcl) .... Take 1 tablet by mouth once a day 15)  Cyclobenzaprine Hcl 10 Mg Tabs (Cyclobenzaprine hcl) .... 1/2 - 1 tab by mouth up to 3 x a day for muscle spasm.  Patient Instructions: 1)  lantus pen (long acting insulin) 15 units Subcutaneously in the morning.   2)  We will call you with your lab results.   3)  Can try the muscle relaxer in the meantime.  Prescriptions: CYCLOBENZAPRINE HCL 10 MG TABS (CYCLOBENZAPRINE HCL) 1/2 - 1 tab by mouth up to 3 x a day for muscle spasm.  #20 x 0   Entered and  Authorized by:   [name] MD   Signed by:   [name] MD on 10/05/2009   Method used:   Electronically to        [facility]* (retail)       [address] [place], [place]  [address]       Ph: [phone]       Fax: [phone]   RxID:   [id number]

## 2010-08-01 NOTE — Letter (Signed)
Summary: Release back to work  [facility]  [address], [address]   [place], [place] [address]   Phone: [phone]  Fax: [phone]    12/16/2009  Nekesha Aller [address] [place], [place]  [address]  To Whom It May Concern,  Please be advised that according to Ms Reveca Desmarais FMLA paperwork she is to be released back to work on 12/12/09.  If you have any questions or concerns, please contact our office.  Sincerely,    Neena Rhymes MD

## 2010-09-17 ENCOUNTER — Encounter: Payer: Self-pay | Admitting: Family Medicine

## 2010-09-22 ENCOUNTER — Encounter: Payer: Self-pay | Admitting: Family Medicine

## 2010-09-22 ENCOUNTER — Ambulatory Visit (INDEPENDENT_AMBULATORY_CARE_PROVIDER_SITE_OTHER): Payer: 59 | Admitting: Family Medicine

## 2010-09-22 DIAGNOSIS — IMO0001 Reserved for inherently not codable concepts without codable children: Secondary | ICD-10-CM

## 2010-09-22 DIAGNOSIS — I10 Essential (primary) hypertension: Secondary | ICD-10-CM

## 2010-09-22 DIAGNOSIS — R918 Other nonspecific abnormal finding of lung field: Secondary | ICD-10-CM | POA: Insufficient documentation

## 2010-09-22 DIAGNOSIS — E119 Type 2 diabetes mellitus without complications: Secondary | ICD-10-CM

## 2010-09-22 LAB — POCT UA - MICROALBUMIN
Albumin/Creatinine Ratio, Urine, POC: 30
Creatinine, POC: 200 mg/dL
Microalbumin Ur, POC: 10 mg/dL

## 2010-09-22 MED ORDER — CLOPIDOGREL BISULFATE 75 MG PO TABS: 75.0000 mg | ORAL_TABLET | Freq: Every day | ORAL | Status: DC

## 2010-09-22 MED ORDER — GLYBURIDE MICRONIZED 6 MG PO TABS: 6.0000 mg | ORAL_TABLET | Freq: Two times a day (BID) | ORAL | Status: DC

## 2010-09-22 MED ORDER — INDAPAMIDE 2.5 MG PO TABS: 2.5000 mg | ORAL_TABLET | Freq: Every day | ORAL | Status: DC

## 2010-09-22 MED ORDER — CLOTRIMAZOLE-BETAMETHASONE 1-0.05 % EX CREA: TOPICAL_CREAM | Freq: Two times a day (BID) | CUTANEOUS | Status: DC

## 2010-09-22 MED ORDER — METFORMIN HCL 1000 MG PO TABS: 1000.0000 mg | ORAL_TABLET | Freq: Two times a day (BID) | ORAL | Status: DC

## 2010-09-22 MED ORDER — RANITIDINE HCL 150 MG PO TABS: 150.0000 mg | ORAL_TABLET | Freq: Two times a day (BID) | ORAL | Status: DC

## 2010-09-22 MED ORDER — NEEDLES & SYRINGES MISC: Status: DC

## 2010-09-22 MED ORDER — INSULIN GLARGINE 100 UNIT/ML ~~LOC~~ SOLN: 40.0000 [IU] | Freq: Every day | SUBCUTANEOUS | Status: DC

## 2010-09-22 MED ORDER — LISINOPRIL 10 MG PO TABS: 10.0000 mg | ORAL_TABLET | Freq: Every day | ORAL | Status: DC

## 2010-09-22 NOTE — Assessment & Plan Note (Signed)
Overall she is fairly poorly controlled. Unsure why took her so long to come in to visit. She has been running consistently in the 200s the last 2 months. This point we'll increase her Lantus to 40 units. Fasting crease by 2 units daily above that for every day that her fasting blood sugar is over 130. Her microalbumin is negative today. Followup in one month to make sure that her sugars are decreasing. We did send her for refills to [facility] today.

## 2010-09-22 NOTE — Assessment & Plan Note (Addendum)
Her blood pressure is elevated today. Normal she is extremely well controlled on 10 mg of lisinopril. I will have her followup in one week to recheck her blood pressure. It is still a little elevated we can increase her  lisinoprill to 20 mg.

## 2010-09-22 NOTE — Progress Notes (Signed)
  Subjective:    Patient ID: Natalie Wright, female    DOB: [birth year], 60 y.o.   MRN: [id number]  Diabetes She presents for her follow-up diabetic visit. She has type 2 diabetes mellitus. Her disease course has been worsening. There are no hypoglycemic complications. Symptoms are worsening. She is compliant with treatment all of the time. Her breakfast blood glucose range is generally >200 mg/dl.  She recently started weight watcher and is trying to lose weight.   Was seen in the ED in November for atypical chest pain. WAs dx with chostochondritis. Did a CTA and saw several nodules that needs to be repeated in November of this year.  Has been exposed to stripper on a regular basis, called [employer] at work.  Says the area is not well ventilated when they use the stripper.  Wants a work note to try to accommodate her to not be around the fumes.     she does have a mole on the bottom of her right foot on her third toe. She thinks it is new and would like me to look at today. It is otherwise not bothersome or itchy or raised.  Review of Systems     Objective:   Physical Exam  Constitutional: She appears well-developed and well-nourished.       Obese  HENT:  Head: Normocephalic.  Neck: Normal range of motion.  Cardiovascular: Normal rate, regular rhythm and normal heart sounds.   Pulmonary/Chest: Effort normal and breath sounds normal.  Skin:        She has a very dark black approximately 0.2 mm mole on her right foot bottom of the third toe. It is macular.          Assessment & Plan:  1.  Atypical nevus - I do recommend she see dermatology for biopsy the lesion. Patient says she was to go home and discuss with her husband who helps to care for her feet it has been there for a while or if it is needed.

## 2010-09-22 NOTE — Assessment & Plan Note (Signed)
Per guidelines they recommend followup CT at 6-12 months and then repeat again at 18-24 months is unchanged. We will schedule followup CT in May. She is fairly low risk for malignancy.

## 2010-09-22 NOTE — Patient Instructions (Addendum)
Call me and let me know if mole is fairly new and will refer to Dermatology for further evaluation.  Increase lantus to 40 units daily. Can increase by 2 units a daily until fasting AM sugars are consistantly under 130

## 2010-10-06 ENCOUNTER — Other Ambulatory Visit: Payer: Self-pay | Admitting: Family Medicine

## 2010-10-22 ENCOUNTER — Encounter: Payer: Self-pay | Admitting: Family Medicine

## 2010-10-23 ENCOUNTER — Encounter: Payer: Self-pay | Admitting: Family Medicine

## 2010-10-23 ENCOUNTER — Ambulatory Visit (INDEPENDENT_AMBULATORY_CARE_PROVIDER_SITE_OTHER): Payer: 59 | Admitting: Family Medicine

## 2010-10-23 ENCOUNTER — Ambulatory Visit: Payer: 59 | Admitting: Family Medicine

## 2010-10-23 DIAGNOSIS — Z0000 Encounter for general adult medical examination without abnormal findings: Secondary | ICD-10-CM

## 2010-10-23 DIAGNOSIS — D239 Other benign neoplasm of skin, unspecified: Secondary | ICD-10-CM

## 2010-10-23 DIAGNOSIS — D229 Melanocytic nevi, unspecified: Secondary | ICD-10-CM

## 2010-10-23 DIAGNOSIS — R079 Chest pain, unspecified: Secondary | ICD-10-CM

## 2010-10-23 NOTE — Progress Notes (Signed)
  Subjective:     Natalie Wright is a 60 y.o. female and is here for a comprehensive physical exam. The patient reports no problems. She did run out of plavix because her [facility] didn't send it. About 2 weeks ago started with a "crunching" sensation, but not painful on the right side of her heart.  It was happening up to 10 x in a hour at its worst.  She has not restarted her plavix a week ago and says has only had 2 episodes since then.  No SOB or diaphorsis. Would last for seconds.   History   Social History  . Marital Status: Single    Spouse Name: N/A    Number of Children: N/A  . Years of Education: N/A   Occupational History  . Not on file.   Social History Main Topics  . Smoking status: Former Games developer  . Smokeless tobacco: Not on file  . Alcohol Use: Yes  . Drug Use: Yes  . Sexually Active:    Other Topics Concern  . Not on file   Social History Narrative  . No narrative on file   Health Maintenance  Topic Date Due  . Pap Smear  02/23/1969  . Colonoscopy  02/23/2001  . Influenza Vaccine  04/02/2011  . Mammogram  11/24/2011  . Tetanus/tdap  04/26/2019    The following portions of the patient's history were reviewed and updated as appropriate: past family history, past medical history, past social history, past surgical history and problem list.  Review of Systems Pertinent items are noted in HPI.   Objective:    BP 135/60  Pulse 82  Wt 372 lb (168.738 kg) General appearance: alert, cooperative and appears stated age Head: Normocephalic, without obvious abnormality, atraumatic Eyes: EOM intact ,PEERLA Ears: normal TM's and external ear canals both ears Nose: Nares normal. Septum midline. Mucosa normal. No drainage or sinus tenderness. Throat: lips, mucosa, and tongue normal; teeth and gums normal Neck: no adenopathy, no carotid bruit, no JVD, supple, symmetrical, trachea midline and thyroid not enlarged, symmetric, no tenderness/mass/nodules Back: symmetric, no  curvature. ROM normal. No CVA tenderness. Lungs: clear to auscultation bilaterally Breasts: normal appearance, no masses or tenderness Heart: regular rate and rhythm, S1, S2 normal, no murmur, click, rub or gallop Abdomen: soft, non-tender; bowel sounds normal; no masses,  no organomegaly Extremities: extremities normal, atraumatic, no cyanosis or edema Pulses: 2+ and symmetric Skin: Skin color, texture, turgor normal. No rashes or lesions. Right foot base of 3rd toe has a very darkly pigmented macular nevus that is symmetric.  Lymph nodes: Cervical, supraclavicular, and axillary nodes normal. Neurologic: Grossly normal        Healthy female exam. Also atypical Chest Pain.    Plan:     See After Visit Summary for Counseling Recommendations   Obesity - Due for screening labs. Encouraged weigt loss, regular exercise and healthy diet.  Colonoscopy up to date. Vaccines up to date. Due for pap next summer.   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 Refer to Derm.   Atypical CP - Discussed not likely cardiac. EKG shows NSR, no acute changes. She has felt better back on plavix. Will also check a d-dimer as well.

## 2010-10-23 NOTE — Patient Instructions (Signed)
We will call you with your lab results 

## 2010-10-24 ENCOUNTER — Ambulatory Visit
Admission: RE | Admit: 2010-10-24 | Discharge: 2010-10-24 | Disposition: A | Discharge status: Home / Self Care | Payer: 59 | Source: Ambulatory Visit | Attending: Family Medicine | Admitting: Family Medicine

## 2010-10-24 ENCOUNTER — Telehealth: Payer: Self-pay | Admitting: Family Medicine

## 2010-10-24 LAB — COMPLETE METABOLIC PANEL WITH GFR
Albumin: 4.2 g/dL (ref 3.5–5.2)
BUN: 12 mg/dL (ref 6–23)
CO2: 28 mEq/L (ref 19–32)
Calcium: 9.5 mg/dL (ref 8.4–10.5)
Chloride: 99 mEq/L (ref 96–112)
GFR, Est African American: >60 mL/min (ref >60)
GFR, Est Non African American: >60 mL/min (ref >60)
Glucose, Bld: 169 mg/dL — ABNORMAL HIGH (ref 70–99)
Potassium: 4.4 mEq/L (ref 3.5–5.3)
Sodium: 140 mEq/L (ref 135–145)
Total Protein: 7 g/dL (ref 6.0–8.3)

## 2010-10-24 LAB — D-DIMER, QUANTITATIVE: D-Dimer, Quant: 0.74 ug/mL-FEU — ABNORMAL HIGH (ref 0.00–0.48)

## 2010-10-24 LAB — CBC
MCH: 27.7 pg (ref 26.0–34.0)
MCHC: 32 g/dL (ref 30.0–36.0)
MCV: 86.5 fL (ref 78.0–100.0)
Platelets: 365 10*3/uL (ref 150–400)
RBC: 4.81 MIL/uL (ref 3.87–5.11)

## 2010-10-24 MED ORDER — IOHEXOL 300 MG/ML  SOLN
125.0000 mL | Freq: Once | INTRAMUSCULAR | Status: AC | PRN
  Administered 2010-10-24: 125 mL via INTRAVENOUS

## 2010-10-24 NOTE — Telephone Encounter (Signed)
Call pt: No PE in the lungs. Did have some pulmonary nodules but these should not be causing sxs though they did recommend repeat scan in 6 months to make sure no changes. 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  That means we really need to control your cholesterol as much as possible. I know she didn't tolerate Lipitor. Would she be willing to try a different chol pill?

## 2010-10-24 NOTE — Telephone Encounter (Signed)
Call pt: Labs ok except D-dimer is elevated.  This means at risk for a blood clot. Will schedule for a chest CT for further evaluation. Will try to schedule for today.

## 2010-10-25 MED ORDER — PRAVASTATIN SODIUM 40 MG PO TABS: 40.0000 mg | ORAL_TABLET | Freq: Every day | ORAL | Status: DC

## 2010-10-25 NOTE — Telephone Encounter (Signed)
Addended by: [name] on: 10/25/2010 11:12 AM   Modules accepted: Orders

## 2010-10-25 NOTE — Telephone Encounter (Signed)
Can go to lab this week for fasting cholesterol and then start the chol pill at bedtime. It is for the hardening of her aorta.

## 2010-10-25 NOTE — Telephone Encounter (Signed)
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  Pt understands to not eat or drink for 8-12 hours prior to lab draw.  She agreed to come in on 10-26-10 @ 8:30am to do, then understands to start chol medication.  Triage/SHT

## 2010-10-25 NOTE — Telephone Encounter (Signed)
Pt called and left message VM for triage nurse @ 09:10am.  Requesting Ct/chest results from yesterday.  Pt left number to contact her. Pt call returned by triage nurse and lab results along with CT/chest results were discussed with patient.  Pt understands that Dr Linford Arnold recommends starting a cholesterol medication since D-dimer elevated.  Last cholesterol check however was 12-13-2008 which looked normal.  Pt uses [facility] for her pharmacy.  Pts allergies are:  Keflex(rash,hives), Actos(deteriorate lining of mouth).     Please advise of cholesterol medication you wish to start patient on, and further recommendation for patient follow-up.  Triage/SHT

## 2010-10-26 ENCOUNTER — Other Ambulatory Visit: Payer: 59

## 2010-10-26 LAB — LIPID PANEL
Cholesterol: 195 mg/dL (ref 0–200)
HDL: 48 mg/dL (ref >39)
Total CHOL/HDL Ratio: 4.1 Ratio
VLDL: 18 mg/dL (ref 0–40)

## 2010-10-27 ENCOUNTER — Telehealth: Payer: Self-pay | Admitting: Family Medicine

## 2010-10-27 MED ORDER — PRAVASTATIN SODIUM 40 MG PO TABS: 80.0000 mg | ORAL_TABLET | Freq: Every day | ORAL | Status: DC

## 2010-10-27 NOTE — Telephone Encounter (Signed)
Call pt: LDL is 129. Goal is < 100. Work on exercise and diet. Recheck in 4 moths. We need to increase the pravachol to 80mg .

## 2010-10-30 ENCOUNTER — Telehealth: Payer: Self-pay | Admitting: Family Medicine

## 2010-10-30 NOTE — Telephone Encounter (Signed)
Pt called and saw Dr Katrinka Blazing at [facility] today and wants to know if liver function test were done last week when her labs were drawn per our order. PLAN:  Called pt and told her that a Cholesterol panel was drawn but not liver function test.  Pt will proceed to take order that Dr. Katrinka Blazing gave her for liver function and have drawn.  Pt will have him send a test result to our office as well. Jarvis Newcomer, LPN [facility]

## 2010-10-31 NOTE — Telephone Encounter (Signed)
Left message on pt.'s vm.

## 2010-11-08 ENCOUNTER — Telehealth: Payer: Self-pay | Admitting: Family Medicine

## 2010-11-08 DIAGNOSIS — R918 Other nonspecific abnormal finding of lung field: Secondary | ICD-10-CM

## 2010-11-08 NOTE — Telephone Encounter (Signed)
Message copied by [name] on Wed Nov 08, 2010 12:33 PM ------      Message from: [name]      Created: Fri Sep 22, 2010 12:43 PM        Due for repeat CT to followup on bilateral lung nodules seen at [facility] in November on CTA.

## 2010-11-08 NOTE — Telephone Encounter (Signed)
Ct was ordered adn done in April.

## 2010-11-22 ENCOUNTER — Encounter: Payer: Self-pay | Admitting: Family Medicine

## 2010-11-28 ENCOUNTER — Encounter: Payer: Self-pay | Admitting: Family Medicine

## 2010-11-28 ENCOUNTER — Ambulatory Visit (INDEPENDENT_AMBULATORY_CARE_PROVIDER_SITE_OTHER): Payer: 59 | Admitting: Family Medicine

## 2010-11-28 DIAGNOSIS — I10 Essential (primary) hypertension: Secondary | ICD-10-CM

## 2010-11-28 DIAGNOSIS — IMO0001 Reserved for inherently not codable concepts without codable children: Secondary | ICD-10-CM

## 2010-11-28 NOTE — Assessment & Plan Note (Signed)
At goal.  

## 2010-11-28 NOTE — Assessment & Plan Note (Signed)
I do think she's made significant progress. I explained her that once we are able to get her sugars consistently down and she actually wants to go back when she gets to 100. I explained our goal is still to get a fasting sugar of under 1:30. I would like her to restart her regular exercise and to really watch her diet. She can follow up in 6 weeks and she'll be due for an A1c.

## 2010-11-28 NOTE — Progress Notes (Signed)
  Subjective:    Patient ID: Natalie Wright, female    DOB: [birth year], 60 y.o.   MRN: [id number]  HPI Using Lantus 35 in the morning. Then giving 15-20 units  At bedtime.  Getting 140-170 in the AM.  If sugar is 170-200 then will give 20 units. If 130-150 then will give 15 units. She feels sick nauseated or jittery when gets to 100. She has been trying to not skip meals.   She often skips breakfast.    She has been back on her plavix regularly the right sided CP went away.  She had a normal stress test. She is taking her statin. No SOB, no myalgias thus far like she had on the lipitor.   She plans on going beef and pork free. She had stopped exercising when got the CP and wants to know if Ok to start again.   Review of Systems     Objective:   Physical Exam  Constitutional: She is oriented to person, place, and time. She appears well-developed and well-nourished.       Morbidly obese.   HENT:  Head: Normocephalic and atraumatic.  Cardiovascular: Normal rate, regular rhythm and normal heart sounds.   Pulmonary/Chest: Effort normal and breath sounds normal.  Neurological: She is alert and oriented to person, place, and time.  Skin: Skin is warm and dry.  Psychiatric: She has a normal mood and affect.          Assessment & Plan:  Atypical CP has resolved since restarting her Plavix.

## 2010-12-19 ENCOUNTER — Telehealth: Payer: Self-pay | Admitting: Family Medicine

## 2010-12-19 NOTE — Telephone Encounter (Signed)
Pt notified to cut the dose in half of the chol lowering medication, or take a whole pill every other day to see if that will help with the symptoms she is currently having.  Keep appt in July. Jarvis Newcomer, LPN [facility]

## 2010-12-19 NOTE — Telephone Encounter (Signed)
Call patient: Have her cut the dose in half. Sometimes this will alleviate the symptoms. Or she can try even taking it every other day and see if that also alleviates her symptoms until I see her back in July.

## 2010-12-19 NOTE — Telephone Encounter (Signed)
Pt called and seems to think she is having the same muscle issues that she had when she took the Lipitor in the past.  C/O recent soreness, weakness, in extensions and waist down.  Thinks the same developing symptoms as when she took the Lipitor in the past.  Has return appt in July.  Do we need to stop the chol lowering med (pravastatin 40 mg)?? Plan:  Routed to Dr. Marlyne Beards, LPN [facility]

## 2010-12-31 ENCOUNTER — Other Ambulatory Visit: Payer: Self-pay | Admitting: Family Medicine

## 2011-01-17 ENCOUNTER — Other Ambulatory Visit: Payer: Self-pay | Admitting: Family Medicine

## 2011-02-13 ENCOUNTER — Ambulatory Visit (INDEPENDENT_AMBULATORY_CARE_PROVIDER_SITE_OTHER): Payer: 59 | Admitting: Family Medicine

## 2011-02-13 ENCOUNTER — Encounter: Payer: Self-pay | Admitting: Family Medicine

## 2011-02-13 DIAGNOSIS — E785 Hyperlipidemia, unspecified: Secondary | ICD-10-CM

## 2011-02-13 DIAGNOSIS — IMO0001 Reserved for inherently not codable concepts without codable children: Secondary | ICD-10-CM

## 2011-02-13 DIAGNOSIS — E119 Type 2 diabetes mellitus without complications: Secondary | ICD-10-CM

## 2011-02-13 DIAGNOSIS — R0789 Other chest pain: Secondary | ICD-10-CM

## 2011-02-13 DIAGNOSIS — I10 Essential (primary) hypertension: Secondary | ICD-10-CM

## 2011-02-13 LAB — POCT GLYCOSYLATED HEMOGLOBIN (HGB A1C): Hemoglobin A1C: 9.3

## 2011-02-13 MED ORDER — INSULIN GLARGINE 100 UNIT/ML ~~LOC~~ SOLN: 50.0000 [IU] | Freq: Two times a day (BID) | SUBCUTANEOUS | Status: DC

## 2011-02-13 NOTE — Assessment & Plan Note (Signed)
Not well controlled. Will increase lisinopril to 20mg  and f/u in 1 mo.

## 2011-02-13 NOTE — Progress Notes (Signed)
  Subjective:    Patient ID: Natalie Wright, female    DOB: 22-Jul-1950, 60 y.o.   MRN: [id number]  Diabetes She presents for her follow-up diabetic visit. She has type 2 diabetes mellitus. Her disease course has been stable. Pertinent negatives for diabetes include no blurred vision. Symptoms are stable. Current diabetic treatment includes insulin pump and diet. Her breakfast blood glucose range is generally >200 mg/dl.  Hypertension This is a chronic problem. The current episode started more than 1 year ago. The problem is uncontrolled. Pertinent negatives include no blurred vision, peripheral edema or shortness of breath. There are no associated agents to hypertension. Past treatments include ACE inhibitors. The current treatment provides mild improvement. There are no compliance problems.   Lantus 40 units in the AM and she changes he rPm dose based on her sugar. She  Is not taking a consistant PM dose and almost never uses 40 units at night.   Noticed spots above her right ankle.    Still having "sensation on the right side of the chest".  Feels like a crucnhing sensation but not painful.  Says it is still happening about 8-9 times a day. It is bothersome to her. No diaphoresis. Will refe to crads.   High chol- She was getting sthe same weakness on her pravastain tht she had on the lipitor.  She has dec her dose to once a day (40mg ) and still feeling really weak.    Review of Systems  Eyes: Negative for blurred vision.  Respiratory: Negative for shortness of breath.        Objective:   Physical Exam  Constitutional: She is oriented to person, place, and time. She appears well-developed and well-nourished.       She is morbidly obese.   HENT:  Head: Normocephalic and atraumatic.  Cardiovascular: Normal rate, regular rhythm and normal heart sounds.   Pulmonary/Chest: Effort normal and breath sounds normal.  Musculoskeletal: She exhibits no edema.  Neurological: She is alert and  oriented to person, place, and time.  Skin: Skin is warm and dry.  Psychiatric: She has a normal mood and affect. Her behavior is normal.          Assessment & Plan:  Atypical chest Pain - Will refer to cards for further eval.  Thought I am not convinced this is cardiac but I have no other explaination for this.

## 2011-02-13 NOTE — Patient Instructions (Addendum)
Increase Lantus to 40 units twice a day. Then increase but one unit a day until sugars are running under 130 until I see you back Work on healthy diabetic diet.   Increase lisinopril to 20mg  ( 2 tabs daily) Remember to get your eye exam.  Follow up with me in one month.  We will cal lyou with the cardiology referral.

## 2011-02-13 NOTE — Assessment & Plan Note (Addendum)
Discussed trying her chol pill every other day and see if helps with her weakness sxs. If after one month no improvement then consider trial of livalo.

## 2011-02-13 NOTE — Assessment & Plan Note (Signed)
Lab Results  Component Value Date   HGBA1C 9.3 02/13/2011  Not well controlled. INcrease lantus by one unit a day. Urine micro is up to date. F/U in one month. Needs eye exam updated.  Work on diet changes and weight loss.

## 2011-02-15 ENCOUNTER — Other Ambulatory Visit: Payer: Self-pay | Admitting: Family Medicine

## 2011-02-15 MED ORDER — INSULIN GLARGINE 100 UNIT/ML ~~LOC~~ SOLN: 50.0000 [IU] | Freq: Two times a day (BID) | SUBCUTANEOUS | Status: DC

## 2011-02-16 ENCOUNTER — Encounter: Payer: Self-pay | Admitting: Cardiovascular Disease

## 2011-02-16 ENCOUNTER — Telehealth: Payer: Self-pay | Admitting: Family Medicine

## 2011-02-16 NOTE — Telephone Encounter (Signed)
Pt called and said she was seen by our provider earlier in the week, and is having some heart issues, and she had Dr, Jens Som office call the pt and she has been set up with card for next Monday at 2 pm.  She has a dental appt also scheduled for next Tuesday and is uncertain if she should go or not.   Plan:  Told the pt to go to the card appt on Monday and run it by the cardiologist about her dental appt, but to go ahead and give the dental office a heads up in case she has to cancel her appt. Jarvis Newcomer, LPN [facility]

## 2011-02-19 ENCOUNTER — Ambulatory Visit (INDEPENDENT_AMBULATORY_CARE_PROVIDER_SITE_OTHER): Payer: 59 | Admitting: Cardiovascular Disease

## 2011-02-19 ENCOUNTER — Encounter: Payer: Self-pay | Admitting: Cardiovascular Disease

## 2011-02-19 DIAGNOSIS — E785 Hyperlipidemia, unspecified: Secondary | ICD-10-CM

## 2011-02-19 DIAGNOSIS — R079 Chest pain, unspecified: Secondary | ICD-10-CM | POA: Insufficient documentation

## 2011-02-19 DIAGNOSIS — IMO0001 Reserved for inherently not codable concepts without codable children: Secondary | ICD-10-CM

## 2011-02-19 NOTE — Assessment & Plan Note (Addendum)
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  Would be very cautious about using in this patient with q 3 month CPK"s

## 2011-02-19 NOTE — Assessment & Plan Note (Signed)
Atypical.  Imaging difficult due to size.  Dobutamine echo best option.  May use definity if needed for endocardial definition

## 2011-02-19 NOTE — Patient Instructions (Addendum)
Your physician recommends that you schedule a follow-up appointment in: as needed Your physician has requested that you have a dobutamine echocardiogram. For further information please visit [email or web address] Please follow instruction sheet as given.

## 2011-02-19 NOTE — Progress Notes (Signed)
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  CRF;s HTN and DM.  ECG reviewed from 4/12 is normal.  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  Currently nonexertional right sided pain.  Activity limited by obesity with mild chronic exertional dyspnea.  Describes normal chemical myovue 6 years ago at [facility].  She has poorly controlled DM.  Insulin recently adjusted.  Last A1c was 9.3.  She only checks her BS once/day and can get readings as high as 300's.  Long discussion about obesity and DM.  She has started the bariatric evaluation for surgery at [facility] before with Dr Lily Peer.  Encouraged her to go back and finish and to have surgery.  Pain is persistant but not worsening.  Not pleuritic and not positional.  Lasts minutes.  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  All other systems reviewed and negative   General: Affect appropriate Healthy:  appears stated age HEENT: normal Neck supple with no adenopathy JVP normal no bruits no thyromegaly Lungs clear with no wheezing and good diaphragmatic motion Heart:  S1/S2 no murmur,rub, gallop or click PMI normal Abdomen: benighn, BS positve, no tenderness, no AAA no bruit.  No HSM or HJR Distal pulses intact with no bruits No edema Neuro non-focal Skin warm and dry No muscular weakness  Medications Current Outpatient Prescriptions  Medication Sig Dispense Refill  . acetaminophen (TYLENOL) 650 MG CR tablet Take 650 mg by mouth every 8 (eight) hours as needed.        . cetirizine (ZYRTEC) 10 MG tablet Take 10 mg by mouth daily.        . clopidogrel (PLAVIX) 75 MG tablet TAKE 1 TABLET DAILY  90 tablet  1  . ferrous sulfate 325 (65 FE) MG tablet Take 325 mg by mouth daily with breakfast.         . Ferrous Sulfate Dried (FEOSOL) 200 (65 FE) MG TABS Take 1 tablet by mouth daily.        Marland Kitchen glyBURIDE micronized (GLYNASE) 6 MG tablet TAKE 1 TABLET TWICE A DAY  180 tablet  1  . indapamide (LOZOL) 2.5 MG tablet Take 1 tablet (2.5 mg total) by mouth daily.  90 tablet  2  . insulin glargine (LANTUS) 100 UNIT/ML injection Inject into the skin 2 (two) times daily.        . Insulin Pen Needle (BD ULTRA-FINE PEN NEEDLES) 29G X 12.7MM MISC by Does not apply route as directed.       Marland Kitchen lisinopril (PRINIVIL,ZESTRIL) 10 MG tablet TAKE 1 TABLET DAILY  90 tablet  0  . metFORMIN (GLUCOPHAGE) 1000 MG tablet Take 1 tablet (1,000 mg total) by mouth 2 (two) times daily with a meal.  180 tablet  0  . Needles & Syringes MISC Novofine 30 G   100 each  3  . pravastatin (PRAVACHOL) 40 MG tablet Take 2 tablets (80 mg total) by mouth daily.  30 tablet  11  . ranitidine (ZANTAC) 150 MG tablet Take 1 tablet (150 mg total) by mouth 2 (two) times daily.  180 tablet  3    Allergies Keflex; Lipitor; and Pioglitazone  Family History: Family History  Problem Relation Age of Onset  . Diabetes Mother   . Hypertension Mother   . Glaucoma Mother   . Cancer Mother  bone cancer  . Alcohol abuse Father   . Emphysema Father   . Other Father     CHF  . Cancer Sister     ? utereine ca    Social History: History   Social History  . Marital Status: Single    Spouse Name: N/A    Number of Children: 2  . Years of Education: Masters   Occupational History  . Company secretary   Social History Main Topics  . Smoking status: Former Games developer  . Smokeless tobacco: Never Used  . Alcohol Use: Yes  . Drug Use: Yes  . Sexually Active: Not on file   Other Topics Concern  . Not on file   Social History Narrative   Married to [place].  Foster parent    Electrocardiogram: 4/12  NSR normal ECG  Assessment and Plan

## 2011-02-19 NOTE — Assessment & Plan Note (Signed)
Target A1c 6.5  F/U Dr Eppie Gibson  Not likely to have good control at this weight

## 2011-02-19 NOTE — Assessment & Plan Note (Signed)
F/U with Dr Saunders Glance at [facility].  She will not do well in future without bariatric surgery

## 2011-03-01 ENCOUNTER — Ambulatory Visit (HOSPITAL_BASED_OUTPATIENT_CLINIC_OR_DEPARTMENT_OTHER): Payer: 59 | Admitting: Radiology

## 2011-03-01 ENCOUNTER — Ambulatory Visit (HOSPITAL_COMMUNITY): Payer: 59 | Attending: Cardiovascular Disease | Admitting: Radiology

## 2011-03-01 DIAGNOSIS — E119 Type 2 diabetes mellitus without complications: Secondary | ICD-10-CM | POA: Insufficient documentation

## 2011-03-01 DIAGNOSIS — R51 Headache: Secondary | ICD-10-CM | POA: Insufficient documentation

## 2011-03-01 DIAGNOSIS — R072 Precordial pain: Secondary | ICD-10-CM

## 2011-03-01 DIAGNOSIS — R0609 Other forms of dyspnea: Secondary | ICD-10-CM | POA: Insufficient documentation

## 2011-03-01 DIAGNOSIS — I10 Essential (primary) hypertension: Secondary | ICD-10-CM | POA: Insufficient documentation

## 2011-03-01 DIAGNOSIS — R0989 Other specified symptoms and signs involving the circulatory and respiratory systems: Secondary | ICD-10-CM | POA: Insufficient documentation

## 2011-03-08 ENCOUNTER — Telehealth: Payer: Self-pay | Admitting: Cardiovascular Disease

## 2011-03-08 NOTE — Telephone Encounter (Signed)
Pt returning call from Christine from yesterday.  Please call back.  

## 2011-03-08 NOTE — Telephone Encounter (Signed)
Spoke with pt, aware of stress test results Natalie Wright  

## 2011-03-15 ENCOUNTER — Other Ambulatory Visit: Payer: Self-pay | Admitting: Family Medicine

## 2011-03-15 NOTE — Telephone Encounter (Signed)
Pt needs office visit follow up with the provider regarding her diabetes.  Last Hgb A1C was 9.3 and was instructed to follow up.

## 2011-03-23 ENCOUNTER — Encounter: Payer: Self-pay | Admitting: Family Medicine

## 2011-03-23 ENCOUNTER — Ambulatory Visit (INDEPENDENT_AMBULATORY_CARE_PROVIDER_SITE_OTHER): Payer: 59 | Admitting: Family Medicine

## 2011-03-23 VITALS — BP 138/70 | HR 98 | Wt 380.0 lb

## 2011-03-23 DIAGNOSIS — Z111 Encounter for screening for respiratory tuberculosis: Secondary | ICD-10-CM

## 2011-03-23 DIAGNOSIS — E119 Type 2 diabetes mellitus without complications: Secondary | ICD-10-CM

## 2011-03-23 DIAGNOSIS — I10 Essential (primary) hypertension: Secondary | ICD-10-CM

## 2011-03-23 NOTE — Patient Instructions (Signed)
Please get your eyes checked.

## 2011-03-23 NOTE — Progress Notes (Signed)
Subjective:    Patient ID: Natalie Wright, female    DOB: 01-15-1951, 60 y.o.   MRN: [id number]  HPI DM- has been better about  tracking of her sugars.  She has not been checking them twice a day. Says was using 35 units at night and then was dropping low in the middle of hte night so she started adjusting her insulin..  She brought in her log today. She will give herself anywhere between 20-35 units. She varies it in the morning and evening day by day. There is no consistency whatsoever. Overall her morning sugars little bit better and tend to run in the low 100s and her evening sugars tend to run in the upper 200-300 range.She says when her sugar is lower she gives herself less insulin.    Chest pain-Has a normal stess test.  Has had some palpitatoins since the test sbut says she thinks it is from teh dy they gave her as it happened about 20 min after her test.   Hypercholesterolemia-She did not do well on Lipitor and she says she feels depressed and has been giving her some of her muscle chest pain that she's been having since last fall. She stopped it and says that the muscle type chest pain has resolved. She is convinced that it is from the statin.  Asked her she restarted the medication just to see if the pain recurred. She had not. She really wants to work on more natural things to help get her cholesterol down and sit up based on. She was asking about working with a homeopath.   Review of Systems     Objective:   Physical Exam  Constitutional: She is oriented to person, place, and time. She appears well-developed and well-nourished.       She is morbidly obese.  HENT:  Head: Normocephalic and atraumatic.  Cardiovascular: Normal rate, regular rhythm and normal heart sounds.   Pulmonary/Chest: Effort normal and breath sounds normal.  Neurological: She is alert and oriented to person, place, and time.  Skin: Skin is warm and dry.  Psychiatric: She has a normal mood and affect.           Assessment & Plan:  DM- I explained that she needs to be consistant with her insulin. Can't go up adn down so much.  I explained how long acting insulin works. Give 35 unnits in the AM if 80 or higher. If over 200 give then give 40 units in the AM. Seh is just out of control right now. She will fax me her sugars in one week.  At night give 35 units and if over 300 then can give 40.  She will fax me he number in one week so we can make adjustments.  We may need to add insulin at her evening meal.    Hypercholesterolemia-I did add pravastatin to her intolerance list but I am not 100% convinced that this is the cause of her pain. I would like for her to have restarted the medication to see if it caused her pain to occur again to better confirm the cause. I certainly support her working with a homeopath to help get her cholesterol down but I did recommend only doing this for a short period of time such as 4- 6 months, and if she is unable to reach her LDL goal of 100 or less then she needs to try a third statin. We also discussed the importance of weight loss and controlling her  diet and controlling her blood sugars as all 3 of these will help her cholesterol as well.  30 min spent face to face in counseling.   She also needs a form completed to be a foster parent listing her current major diagnoses and if she is able to handle having foster children. She also needs a TB skin test for this form.

## 2011-03-25 ENCOUNTER — Other Ambulatory Visit: Payer: Self-pay | Admitting: Family Medicine

## 2011-04-02 ENCOUNTER — Telehealth: Payer: Self-pay | Admitting: Family Medicine

## 2011-04-02 NOTE — Telephone Encounter (Signed)
Call pt: Blood sugars are looking better in the AM.  I want her to add short acting insulin before her evening meal. Can pick up samples of Apidra adn start with 10 units before evening meal. Then still give herseft 35--40 at bedtime.

## 2011-04-03 NOTE — Telephone Encounter (Signed)
LMOM for pt to return call. KJ LPN

## 2011-04-04 ENCOUNTER — Telehealth: Payer: Self-pay | Admitting: Family Medicine

## 2011-04-04 NOTE — Telephone Encounter (Signed)
Pt called regarding her blood sugars.  Seen Dr. Linford Arnold a week and half ago.  Faxed these requested numbers in to the provider day before yesterday.  Pt stated Kathlene November had called her and told her Dr. Linford Arnold wanted her to start Apidra insulin before evening meal (10 units).  Continue the lantus 3-40 units at bedtime.   PLan:  Pt will pup Apidra insulin tomorrow am. Jarvis Newcomer, LPN [facility]

## 2011-04-05 ENCOUNTER — Telehealth: Payer: Self-pay | Admitting: Family Medicine

## 2011-04-05 NOTE — Telephone Encounter (Signed)
Closed

## 2011-04-06 NOTE — Telephone Encounter (Signed)
LMOM for Pt to CB 

## 2011-04-10 ENCOUNTER — Other Ambulatory Visit: Payer: Self-pay | Admitting: *Deleted

## 2011-04-10 MED ORDER — METFORMIN HCL 1000 MG PO TABS: 1000.0000 mg | ORAL_TABLET | Freq: Two times a day (BID) | ORAL | Status: DC

## 2011-04-10 NOTE — Telephone Encounter (Signed)
LMOM with dr advise

## 2011-04-25 ENCOUNTER — Telehealth: Payer: Self-pay | Admitting: Family Medicine

## 2011-04-25 DIAGNOSIS — R918 Other nonspecific abnormal finding of lung field: Secondary | ICD-10-CM

## 2011-04-25 NOTE — Telephone Encounter (Signed)
Due for repeat CT to eval lung nodules.

## 2011-04-25 NOTE — Telephone Encounter (Signed)
LMOM

## 2011-05-12 ENCOUNTER — Other Ambulatory Visit: Payer: Self-pay | Admitting: Family Medicine

## 2011-05-14 ENCOUNTER — Ambulatory Visit: Payer: 59 | Admitting: Family Medicine

## 2011-05-14 DIAGNOSIS — Z0289 Encounter for other administrative examinations: Secondary | ICD-10-CM

## 2011-06-06 ENCOUNTER — Other Ambulatory Visit: Payer: Self-pay | Admitting: Family Medicine

## 2011-06-07 ENCOUNTER — Other Ambulatory Visit: Payer: Self-pay | Admitting: Family Medicine

## 2011-06-11 ENCOUNTER — Emergency Department
Admission: EM | Admit: 2011-06-11 | Discharge: 2011-06-11 | Disposition: A | Discharge status: Home / Self Care | Payer: 59 | Source: Home / Self Care | Attending: Emergency Medicine | Admitting: Emergency Medicine

## 2011-06-11 ENCOUNTER — Encounter: Payer: Self-pay | Admitting: *Deleted

## 2011-06-11 ENCOUNTER — Other Ambulatory Visit: Payer: Self-pay | Admitting: Emergency Medicine

## 2011-06-11 DIAGNOSIS — R55 Syncope and collapse: Secondary | ICD-10-CM

## 2011-06-11 LAB — POCT CBC W AUTO DIFF (K'VILLE URGENT CARE)

## 2011-06-11 LAB — POCT FASTING CBG KUC MANUAL ENTRY: POCT Glucose (KUC): 218 mg/dL — AB (ref 70–99)

## 2011-06-11 NOTE — ED Provider Notes (Signed)
History     CSN: [id number] Arrival date & time: 06/11/2011 10:14 AM   First MD Initiated Contact with Patient 06/11/11 1006      Chief Complaint  Patient presents with  . Dizziness     HPI Comments: While shopping at [facility] yesterday, began with acute episodes of feeling lightheaded, doubling in hearing in the ears, and general fatigue, lasted 2 seconds and then resolved. Happened several times yesterday and 6 times this morning. She checked her blood sugar was 270 yesterday, 138 this morning. Denies any focal neurologic symptoms. Denies vertigo focal weakness or numbness. No fever or flu symptoms or myalgias or than her usual myalgias. Has minimal sinus congestion. No change in vision. Denies headache. Had mild nausea yesterday that resolved.  No changes in BP or diabetes medications in the past month, but she states that her PCP has been changing her diabetes meds over the past few months an attempt for better control. Her PCP is Dr. Eppie Gibson, but she has an appointment with Dr. Thurmond Butts in the same office on Thursday, 3 days from now.  I reviewed her extensive past medical history. She states she had a negative cardiac workup with negative thallium type stress test at her cardiologist's office here to ago.  Reviewed her medication list which includes generic Plavix, and she takes aspirin 325 mg daily.  Patient is a 60 y.o. female presenting with syncope. The history is provided by the patient.  Loss of Consciousness This is a new (Episodes of presyncope lasting 2 seconds each time, without loss of consciousness for 24 hours.) problem. The current episode started yesterday. The problem occurs hourly. The problem has not changed since onset.Pertinent negatives include no chest pain, no abdominal pain, no headaches and no shortness of breath. Associated symptoms comments: Occasional vague feelings of "bubbling" in my ears".. The symptoms are aggravated by nothing. The symptoms are relieved by  nothing. She has tried nothing for the symptoms.    Past Medical History  Diagnosis Date  . Hypertension   . Allergy     to cats and dogs  . Hiatal hernia     and delayed gastric emptying/ sees [facility] for recurrent diarrhea  . Pedal edema   . TIA (transient ischemic attack)     hx of- sees Dr Rayburn Ma (Neurology)  . Spastic bladder     urology in [place]  . OSA (obstructive sleep apnea)     CPAP  . Diabetes mellitus     History reviewed. No pertinent past surgical history.  Family History  Problem Relation Age of Onset  . Diabetes Mother   . Hypertension Mother   . Glaucoma Mother   . Cancer Mother     bone cancer  . Alcohol abuse Father   . Emphysema Father   . Other Father     CHF  . Cancer Sister     ? utereine ca    History  Substance Use Topics  . Smoking status: Former Games developer  . Smokeless tobacco: Never Used  . Alcohol Use: Yes    OB History    Grav Para Term Preterm Abortions TAB SAB Ect Mult Living                  Review of Systems  Constitutional: Positive for fatigue. Negative for fever, chills and diaphoresis.  HENT: Positive for hearing loss (Chronic), ear pain (A vague fullness in the ears at times, not currently) and congestion. Negative for nosebleeds, facial  swelling, rhinorrhea, sneezing, neck pain, tinnitus and ear discharge.   Eyes: Negative.  Negative for photophobia, discharge and visual disturbance.  Respiratory: Negative.  Negative for shortness of breath and wheezing.   Cardiovascular: Positive for leg swelling (Chronic bilateral pedal edema) and syncope. Negative for chest pain.  Gastrointestinal: Negative.  Negative for abdominal pain.  Genitourinary: Negative.   Musculoskeletal: Negative.   Neurological: Positive for weakness (Generalized weakness, no focal weakness. This is chronic, she states) and light-headedness. Negative for tremors, seizures, syncope, speech difficulty, numbness and headaches.  Hematological: Negative.     Psychiatric/Behavioral: Positive for dysphoric mood (Admits to some stress over the past year, as she was told that she'll be losing her job next year. Denies suicidal or homicidal ideation.). Negative for hallucinations and confusion.    Allergies  Keflex; Lipitor; Pioglitazone; and Pravastatin  Home Medications   Current Outpatient Rx  Name Route Sig Dispense Refill  . ACETAMINOPHEN ER 650 MG PO TBCR Oral Take 650 mg by mouth every 8 (eight) hours as needed.      Marland Kitchen CETIRIZINE HCL 10 MG PO TABS Oral Take 10 mg by mouth daily.      Marland Kitchen CLOPIDOGREL BISULFATE 75 MG PO TABS  TAKE 1 TABLET DAILY 90 tablet 0  . FERROUS SULFATE 325 (65 FE) MG PO TABS Oral Take 325 mg by mouth daily with breakfast.     . FERROUS SULFATE DRIED 200 (65 FE) MG PO TABS Oral Take 1 tablet by mouth daily.      . GLYBURIDE MICRONIZED 6 MG PO TABS  TAKE 1 TABLET TWICE A DAY 180 tablet 1  . INDAPAMIDE 2.5 MG PO TABS  TAKE 1 TABLET DAILY 90 tablet 1  . INSULIN GLARGINE 100 UNIT/ML [place] SOLN Subcutaneous Inject into the skin 2 (two) times daily.      . INSULIN PEN NEEDLE 29G X 12.7MM MISC Does not apply by Does not apply route as directed.     Marland Kitchen LISINOPRIL 10 MG PO TABS  TAKE 1 TABLET BY MOUTH EVERY DAY AS DIRECTED 30 tablet 1  . METFORMIN HCL 1000 MG PO TABS Oral Take 1 tablet (1,000 mg total) by mouth 2 times daily at 12 noon and 4 pm. 180 tablet 0  . NEEDLES & SYRINGES MISC  Novofine 30 G  100 each 3  . PRAVASTATIN SODIUM 40 MG PO TABS Oral Take 2 tablets (80 mg total) by mouth daily. 30 tablet 11  . RANITIDINE HCL 150 MG PO TABS Oral Take 1 tablet (150 mg total) by mouth 2 (two) times daily. 180 tablet 3    BP 170/82  Pulse 80  Temp(Src) 98.3 F (36.8 C) (Oral)  Resp 18  Ht 5\' 3"  (1.6 m)  Wt 380 lb (172.367 kg)  BMI 67.31 kg/m2  SpO2 96%  Physical Exam  Nursing note and vitals reviewed. Constitutional: She is oriented to person, place, and time. She appears well-developed and well-nourished.  Non-toxic  appearance. No distress.       Morbidly obese  HENT:  Head: Normocephalic and atraumatic.  Right Ear: Tympanic membrane and external ear normal.  Left Ear: Tympanic membrane and external ear normal.  Nose: Nose normal.  Mouth/Throat: Oropharynx is clear and moist.       Mild wax right external canal, but canal is patent without impaction. External ear is nontender to palpation  Eyes: Conjunctivae and EOM are normal. Pupils are equal, round, and reactive to light. Right eye exhibits no discharge. Left eye  exhibits no discharge. No scleral icterus.  Neck: Normal range of motion. Neck supple. No JVD present. No thyromegaly present.  Cardiovascular: Normal rate and regular rhythm.  Exam reveals no gallop and no friction rub.   No murmur heard. Pulmonary/Chest: Effort normal and breath sounds normal. No respiratory distress. She has no wheezes. She has no rales. She exhibits no tenderness.  Abdominal: Soft. She exhibits no distension. There is no tenderness.  Musculoskeletal: She exhibits edema (Trace bilateral pedal edema. No cords or tenderness or heat). She exhibits no tenderness.  Lymphadenopathy:    She has no cervical adenopathy.  Neurological: She is alert and oriented to person, place, and time. She has normal strength and normal reflexes. She displays normal reflexes. No cranial nerve deficit or sensory deficit. She exhibits normal muscle tone. She displays a negative Romberg sign. Gait (Her gait is broad-based and slow, but able to walk without assistance) abnormal. Coordination normal.  Skin: Skin is warm and dry. No rash noted. She is not diaphoretic. No pallor.  Psychiatric: She has a normal mood and affect. Thought content normal.    ED Course  Procedures (including critical care time)  Labs Reviewed  POCT FASTING CBG [facility] MANUAL ENTRY - Abnormal; Notable for the following:    POCT Glucose ([facility]) 218 (*)    All other components within normal limits     1. Syncope, near        MDM  Other labs done today: CBC within normal limits, WBC 9.4, hemoglobin 12.6. EKG today within normal limits. Normal sinus rhythm, in my opinion no sign of ischemic change or other abnormality when compared to prior EKG. Compared to prior EKG of April 2012, shows no significant change.  Explained to patient that I do not have a definite explanation for her very brief episodes of lightheadedness. Neurologic exam normal, and her symptoms are not consistent with a TIA or neurologic event. No evidence of cardiorespiratory event. It's unlikely that her symptoms are from hypoglycemia, as her glucose was 218 today.  I explained to her that it is possible that symptoms could be from stress, and her PCP may wish to reevaluate this to see if she is a candidate for an antidepressant. Advised her to monitor blood glucose frequently and to immediately check home blood glucose if she were to have any further symptoms of lightheadedness. Other labs drawn today that are pending are CMP and TSH. When she sees Dr. Thurmond Butts, her PCP in 3 days, he can reevaluate and review these labs. For now, continue with current medications, but if she continues to have a high blood sugars, her PCP may consider referral to an endocrinologist. She understands to go to ER stat if she has any severe worsening symptoms.Options discussed. Risks, benefits, alternatives discussed. Patient voiced understanding and agreement.        Lonell Face, MD 06/11/11 [phone]

## 2011-06-11 NOTE — ED Notes (Signed)
Patient reports feeling dizziness and changes in her sensation several times yesterday and on into today. No changes in BP or diabetes medications in the past month. Her CBG is 218. She also reports changes in her hearing and hearing a bubbling sound.

## 2011-06-12 LAB — COMPREHENSIVE METABOLIC PANEL
ALT: 22 U/L (ref 0–35)
AST: 21 U/L (ref 0–37)
Albumin: 4 g/dL (ref 3.5–5.2)
Alkaline Phosphatase: 87 U/L (ref 39–117)
BUN: 14 mg/dL (ref 6–23)
CO2: 26 mEq/L (ref 19–32)
Calcium: 9.7 mg/dL (ref 8.4–10.5)
Chloride: 101 mEq/L (ref 96–112)
Creat: 0.78 mg/dL (ref 0.50–1.10)
Glucose, Bld: 207 mg/dL — ABNORMAL HIGH (ref 70–99)
Potassium: 5 mEq/L (ref 3.5–5.3)
Sodium: 139 mEq/L (ref 135–145)
Total Bilirubin: 0.4 mg/dL (ref 0.3–1.2)
Total Protein: 6.7 g/dL (ref 6.0–8.3)

## 2011-06-12 LAB — TSH: TSH: 1.905 u[IU]/mL (ref 0.350–4.500)

## 2011-06-14 ENCOUNTER — Encounter: Payer: Self-pay | Admitting: Family Medicine

## 2011-06-14 ENCOUNTER — Ambulatory Visit (INDEPENDENT_AMBULATORY_CARE_PROVIDER_SITE_OTHER): Payer: 59 | Admitting: Family Medicine

## 2011-06-14 VITALS — BP 157/79 | HR 89 | Temp 98.1°F | Ht 63.0 in | Wt 385.0 lb

## 2011-06-14 DIAGNOSIS — E119 Type 2 diabetes mellitus without complications: Secondary | ICD-10-CM

## 2011-06-14 DIAGNOSIS — H9390 Unspecified disorder of ear, unspecified ear: Secondary | ICD-10-CM

## 2011-06-14 DIAGNOSIS — R42 Dizziness and giddiness: Secondary | ICD-10-CM

## 2011-06-14 DIAGNOSIS — Z23 Encounter for immunization: Secondary | ICD-10-CM

## 2011-06-14 DIAGNOSIS — H8390 Unspecified disease of inner ear, unspecified ear: Secondary | ICD-10-CM

## 2011-06-14 MED ORDER — MECLIZINE HCL 25 MG PO TABS: 25.0000 mg | ORAL_TABLET | Freq: Three times a day (TID) | ORAL | Status: DC | PRN

## 2011-06-14 MED ORDER — FLUTICASONE PROPIONATE 50 MCG/ACT NA SUSP: 2.0000 | Freq: Every day | NASAL | Status: DC

## 2011-06-14 MED ORDER — ONDANSETRON 8 MG PO TBDP: 8.0000 mg | ORAL_TABLET | Freq: Three times a day (TID) | ORAL | Status: AC | PRN

## 2011-06-14 MED ORDER — CETIRIZINE-PSEUDOEPHEDRINE ER 5-120 MG PO TB12: 1.0000 | ORAL_TABLET | Freq: Two times a day (BID) | ORAL | Status: DC

## 2011-06-14 NOTE — Progress Notes (Signed)
  Subjective:    Patient ID: Natalie Wright, female    DOB: Nov 13, 1950, 60 y.o.   MRN: [id number]  HPI Patient is here because of dizziness. She reports being dizzy last week and she was evaluated at the urgent care and EKG was done as well as blood sugar to make sure the blood sugar was not completely out-of-control to make sure that the near-syncopal episode was not cardiac in origin. She's she was referred back to this office at the dizziness continued which it has. She reports feeling lightheaded dizzy and time and having some nausea as well she supposed to work at [employer] Saturday and she's very concerned about climbing steps with his dizziness #2 immunization update patient needs  flu shot #3 diabetes poorly controlled when she was at the urgent care blood sugars were greater than 300 today is 218 Review of Systems Unremarkable other than mentioned above    Objective:   Physical Exam BP 157/79  Pulse 89  Temp(Src) 98.1 F (36.7 C) (Oral)  Ht 5\' 3"  (1.6 m)  Wt 385 lb (174.635 kg)  BMI 68.20 kg/m2  SpO2 100% Obese obese black female no acute distress both TMs were clear wax present behind the right ear nasal turbinates unremarkable no tenderness to maxillary sinuses neck supple trachea midline thyroid unremarkable lungs clear cardiovascular S1-S2     Results for orders placed during the hospital encounter of 06/11/11  POCT FASTING CBG [facility] MANUAL ENTRY      Component Value Range   POCT Glucose ([facility]) 218 (*) 70 - 99 (mg/dL)   Assessment & Plan:  Near syncopal episode vertigo Antivert 25 mg one tablet Q6 to 8 hours when necessary Zofran 8 mg one tablet sublingual as needed for nausea Allegra-D or Zyrtec D. one flu vaccination given tablet twice a day Flonase nasal spray 2 puffs each nostril daily followup next week if not better work note also written #2 flu shot given #3 diabetes poorly controlled type of the need for her to follow diet more closely start exercising etc. she feels  that she knows what needs to be done she says not doing it We'll have her followup with Dr. Glade Lloyd for A1c in the near future

## 2011-06-14 NOTE — Patient Instructions (Signed)
Follow up w/Dr Linford Arnold in 3-4 weeks . If not improving by Monday please call and we can schedule a CT scan.Vertigo Vertigo means you feel like you or your surroundings are moving when they are not. Vertigo can be dangerous if it occurs when you are at work, driving, or performing difficult activities.  CAUSES  Vertigo occurs when there is a conflict of signals sent to your brain from the visual and sensory systems in your body. There are many different causes of vertigo, including:  Infections, especially in the inner ear.   A bad reaction to a drug or misuse of alcohol and medicines.   Withdrawal from drugs or alcohol.   Rapidly changing positions, such as lying down or rolling over in bed.   A migraine headache.   Decreased blood flow to the brain.   Increased pressure in the brain from a head injury, infection, tumor, or bleeding.  SYMPTOMS  You may feel as though the world is spinning around or you are falling to the ground. Because your balance is upset, vertigo can cause nausea and vomiting. You may have involuntary eye movements (nystagmus). DIAGNOSIS  Vertigo is usually diagnosed by physical exam. If the cause of your vertigo is unknown, your caregiver may perform imaging tests, such as an MRI scan (magnetic resonance imaging). TREATMENT  Most cases of vertigo resolve on their own, without treatment. Depending on the cause, your caregiver may prescribe certain medicines. If your vertigo is related to body position issues, your caregiver may recommend movements or procedures to correct the problem. In rare cases, if your vertigo is caused by certain inner ear problems, you may need surgery. HOME CARE INSTRUCTIONS   Follow your caregiver's instructions.   Avoid driving.   Avoid operating heavy machinery.   Avoid performing any tasks that would be dangerous to you or others during a vertigo episode.   Tell your caregiver if you notice that certain medicines seem to be causing  your vertigo. Some of the medicines used to treat vertigo episodes can actually make them worse in some people.  SEEK IMMEDIATE MEDICAL CARE IF:   Your medicines do not relieve your vertigo or are making it worse.   You develop problems with talking, walking, weakness, or using your arms, hands, or legs.   You develop severe headaches.   Your nausea or vomiting continues or gets worse.   You develop visual changes.   A family member notices behavioral changes.   Your condition gets worse.  MAKE SURE YOU:  Understand these instructions.   Will watch your condition.   Will get help right away if you are not doing well or get worse.  Document Released: 03/28/2005 Document Revised: 02/28/2011 Document Reviewed: 01/04/2011 [facility] Patient Information 2012 [place], [place].

## 2011-06-26 ENCOUNTER — Other Ambulatory Visit: Payer: Self-pay | Admitting: Family Medicine

## 2011-07-17 ENCOUNTER — Ambulatory Visit (INDEPENDENT_AMBULATORY_CARE_PROVIDER_SITE_OTHER): Payer: 59 | Admitting: Family Medicine

## 2011-07-17 ENCOUNTER — Encounter: Payer: Self-pay | Admitting: Family Medicine

## 2011-07-17 VITALS — BP 114/58 | HR 96 | Ht 61.0 in | Wt 386.0 lb

## 2011-07-17 DIAGNOSIS — E119 Type 2 diabetes mellitus without complications: Secondary | ICD-10-CM

## 2011-07-17 DIAGNOSIS — R42 Dizziness and giddiness: Secondary | ICD-10-CM

## 2011-07-17 DIAGNOSIS — H8390 Unspecified disease of inner ear, unspecified ear: Secondary | ICD-10-CM

## 2011-07-17 DIAGNOSIS — H9390 Unspecified disorder of ear, unspecified ear: Secondary | ICD-10-CM

## 2011-07-17 DIAGNOSIS — IMO0001 Reserved for inherently not codable concepts without codable children: Secondary | ICD-10-CM

## 2011-07-17 LAB — POCT GLYCOSYLATED HEMOGLOBIN (HGB A1C): Hemoglobin A1C: 8.4

## 2011-07-17 MED ORDER — INSULIN GLULISINE 100 UNIT/ML ~~LOC~~ SOLN: 10.0000 [IU] | Freq: Three times a day (TID) | SUBCUTANEOUS | Status: DC

## 2011-07-17 MED ORDER — AMOXICILLIN-POT CLAVULANATE 875-125 MG PO TABS: 1.0000 | ORAL_TABLET | Freq: Two times a day (BID) | ORAL | Status: DC

## 2011-07-17 MED ORDER — CETIRIZINE-PSEUDOEPHEDRINE ER 5-120 MG PO TB12: 1.0000 | ORAL_TABLET | Freq: Two times a day (BID) | ORAL | Status: DC

## 2011-07-17 MED ORDER — FLUTICASONE PROPIONATE 50 MCG/ACT NA SUSP: 2.0000 | Freq: Every day | NASAL | Status: DC

## 2011-07-17 MED ORDER — MECLIZINE HCL 25 MG PO TABS: 25.0000 mg | ORAL_TABLET | Freq: Three times a day (TID) | ORAL | Status: DC | PRN

## 2011-07-17 MED ORDER — AMOXICILLIN-POT CLAVULANATE 875-125 MG PO TABS: 1.0000 | ORAL_TABLET | Freq: Two times a day (BID) | ORAL | Status: AC

## 2011-07-17 NOTE — Progress Notes (Signed)
Subjective:     Patient ID: Natalie Wright, female   DOB: [birth year], 61 y.o.   MRN: [id number]  HPI Patient is still reporting trouble w/her vertigo. She reports having a bubbling sensation in both. She states that when she swallows she has a pressure sensation in her ears.Over the weekend she reports 3 episode. But they were not nearly as bad.  Review of Systemsr  The nausea has resolved BP 114/58  Pulse 96  Ht 5\' 1"  (1.549 m)  Wt 386 lb (175.088 kg)  BMI 72.93 kg/m2  SpO2 98%    Objective:   Physical Exam OBF no acute distress. Neck supple. No significant adenopathy. L TM was fine R obstructed w/wax.     Assessment:     Eustachian tube dysfunction vertigo    Plan:     Renew antivert. Continue decongestant and nasal spray Will consider antibiotics.

## 2011-07-17 NOTE — Patient Instructions (Signed)
Vertigo Vertigo means you feel like you or your surroundings are moving when they are not. Vertigo can be dangerous if it occurs when you are at work, driving, or performing difficult activities.   CAUSES   Vertigo occurs when there is a conflict of signals sent to your brain from the visual and sensory systems in your body. There are many different causes of vertigo, including:  Infections, especially in the inner ear.     A bad reaction to a drug or misuse of alcohol and medicines.     Withdrawal from drugs or alcohol.     Rapidly changing positions, such as lying down or rolling over in bed.     A migraine headache.     Decreased blood flow to the brain.     Increased pressure in the brain from a head injury, infection, tumor, or bleeding.  SYMPTOMS   You may feel as though the world is spinning around or you are falling to the ground. Because your balance is upset, vertigo can cause nausea and vomiting. You may have involuntary eye movements (nystagmus). DIAGNOSIS   Vertigo is usually diagnosed by physical exam. If the cause of your vertigo is unknown, your caregiver may perform imaging tests, such as an MRI scan (magnetic resonance imaging). TREATMENT   Most cases of vertigo resolve on their own, without treatment. Depending on the cause, your caregiver may prescribe certain medicines. If your vertigo is related to body position issues, your caregiver may recommend movements or procedures to correct the problem. In rare cases, if your vertigo is caused by certain inner ear problems, you may need surgery. HOME CARE INSTRUCTIONS    Follow your caregiver's instructions.     Avoid driving.     Avoid operating heavy machinery.     Avoid performing any tasks that would be dangerous to you or others during a vertigo episode.     Tell your caregiver if you notice that certain medicines seem to be causing your vertigo. Some of the medicines used to treat vertigo episodes can actually  make them worse in some people.  SEEK IMMEDIATE MEDICAL CARE IF:    Your medicines do not relieve your vertigo or are making it worse.     You develop problems with talking, walking, weakness, or using your arms, hands, or legs.     You develop severe headaches.     Your nausea or vomiting continues or gets worse.     You develop visual changes.     A family member notices behavioral changes.     Your condition gets worse.  MAKE SURE YOU:  Understand these instructions.     Will watch your condition.     Will get help right away if you are not doing well or get worse.  Document Released: 03/28/2005 Document Revised: 02/28/2011 Document Reviewed: 01/04/2011 [employer] Patient Information 2012 [employer]. 

## 2011-07-17 NOTE — Assessment & Plan Note (Signed)
Assessment: Uncontrolled diabetes Plan: Patient A1c has improved. From about 9.72 to  8.4. Results for orders placed in visit on 07/17/11  POCT GLYCOSYLATED HEMOGLOBIN (HGB A1C)      Component Value Range   Hemoglobin A1C 8.4     she reports that the the insulin that she takes before meals now has helped. She's to take her Lantus 35 units twice a day. She did not increase it to 40 at night and she is taking the shorter acting insulin Adipex. In reviewing the chart it turns out the she's actually on  Apidra. Will renew this medication for her to have followup with Dr. Eppie Gibson in 3-4 months.

## 2011-08-20 ENCOUNTER — Other Ambulatory Visit: Payer: Self-pay | Admitting: Family Medicine

## 2011-08-20 NOTE — Telephone Encounter (Signed)
Please call patient. Normal mammogram.  Repeat in 1 year.  

## 2011-08-31 ENCOUNTER — Telehealth: Payer: Self-pay | Admitting: *Deleted

## 2011-08-31 NOTE — Telephone Encounter (Signed)
LM on VM.

## 2011-08-31 NOTE — Telephone Encounter (Signed)
Pt states that she received a jury summons and would like to know if you would be willing to give her a letter stating that she is unable to do jury duty due to her being unable to walk long distance.  States that her jury duty is 09-12-11. Please advise.

## 2011-08-31 NOTE — Telephone Encounter (Signed)
No she must do jury duty.

## 2011-09-05 ENCOUNTER — Other Ambulatory Visit: Payer: Self-pay | Admitting: Family Medicine

## 2011-10-15 ENCOUNTER — Ambulatory Visit: Payer: 59 | Admitting: Family Medicine

## 2011-10-18 ENCOUNTER — Other Ambulatory Visit: Payer: Self-pay | Admitting: Family Medicine

## 2011-10-29 ENCOUNTER — Ambulatory Visit: Payer: 59 | Admitting: Family Medicine

## 2011-11-10 ENCOUNTER — Other Ambulatory Visit: Payer: Self-pay | Admitting: Family Medicine

## 2011-12-02 ENCOUNTER — Other Ambulatory Visit: Payer: Self-pay | Admitting: Family Medicine

## 2012-01-07 ENCOUNTER — Encounter: Payer: Self-pay | Admitting: Family Medicine

## 2012-01-07 ENCOUNTER — Ambulatory Visit (INDEPENDENT_AMBULATORY_CARE_PROVIDER_SITE_OTHER): Payer: Self-pay | Admitting: Family Medicine

## 2012-01-07 VITALS — BP 135/67 | HR 70 | Wt 394.0 lb

## 2012-01-07 DIAGNOSIS — H8390 Unspecified disease of inner ear, unspecified ear: Secondary | ICD-10-CM

## 2012-01-07 DIAGNOSIS — I10 Essential (primary) hypertension: Secondary | ICD-10-CM

## 2012-01-07 DIAGNOSIS — E119 Type 2 diabetes mellitus without complications: Secondary | ICD-10-CM

## 2012-01-07 DIAGNOSIS — M25473 Effusion, unspecified ankle: Secondary | ICD-10-CM

## 2012-01-07 DIAGNOSIS — R609 Edema, unspecified: Secondary | ICD-10-CM

## 2012-01-07 DIAGNOSIS — IMO0001 Reserved for inherently not codable concepts without codable children: Secondary | ICD-10-CM

## 2012-01-07 LAB — POCT UA - MICROALBUMIN

## 2012-01-07 MED ORDER — METFORMIN HCL 1000 MG PO TABS: 1000.0000 mg | ORAL_TABLET | Freq: Two times a day (BID) | ORAL | Status: DC

## 2012-01-07 MED ORDER — LISINOPRIL-HYDROCHLOROTHIAZIDE 20-12.5 MG PO TABS: 1.0000 | ORAL_TABLET | Freq: Every day | ORAL | Status: DC

## 2012-01-07 MED ORDER — INSULIN GLULISINE 100 UNIT/ML ~~LOC~~ SOLN
10.0000 [IU] | Freq: Three times a day (TID) | SUBCUTANEOUS | Status: AC
Start: 2012-01-07 — End: 2013-01-06

## 2012-01-07 MED ORDER — CLOPIDOGREL BISULFATE 75 MG PO TABS: 75.0000 mg | ORAL_TABLET | Freq: Every day | ORAL | Status: DC

## 2012-01-07 MED ORDER — FLUTICASONE PROPIONATE 50 MCG/ACT NA SUSP: 2.0000 | Freq: Every day | NASAL | Status: DC

## 2012-01-07 MED ORDER — INSULIN GLARGINE 100 UNIT/ML ~~LOC~~ SOLN: 50.0000 [IU] | Freq: Every day | SUBCUTANEOUS | Status: DC

## 2012-01-07 NOTE — Patient Instructions (Addendum)
Increase Lantus to 50 units twice a day. Followup in 3 months. Call me any problems with any fluid pill.

## 2012-01-07 NOTE — Progress Notes (Signed)
  Subjective:    Patient ID: Natalie Wright, female    DOB: Nov 08, 1950, 61 y.o.   MRN: [id number]  HPI DM- Sugars were running very high during her vertigo. She says over the last 2.5 months. Sugars have been running 100-189.  Says she know higher depending on what she eats for her evening meal.  Says her heart problems are much better after she increased her water intake. She had neg cardiac w/u back in the fall ( almost  A year ago).  Having eyes  Recheck this month.   Had vertigo for about 3 months over the winter and really struggled with this.  She is much better her symptoms have resolved.  HTN - Taking 2 lisinporil (total of 20mg ).  She says her ankles have been much more swollen.  No CP or SOB.     Review of Systems     Objective:   Physical Exam  Constitutional: She is oriented to person, place, and time. She appears well-developed and well-nourished.       Morbidly obese   HENT:  Head: Normocephalic and atraumatic.  Cardiovascular: Normal rate, regular rhythm and normal heart sounds.   Pulmonary/Chest: Effort normal and breath sounds normal.  Musculoskeletal: She exhibits edema.       Trace ankle edema bilaterall  Neurological: She is alert and oriented to person, place, and time.  Skin: Skin is warm and dry.  Psychiatric: She has a normal mood and affect. Her behavior is normal.          Assessment & Plan:  DM - Uncontrolled. A1C is 8.7.  Due for fasting lipids.  F/U in 3 months. Work on diet and exercise. Increase lantus to 50 units BID. Continue 10 units of apidra with each meal. She didn't brin gin her glucometer or numbers today. New rx sent to mail order pharmacy. There is protein in the urine. On ACEi.  Lab Results  Component Value Date   HGBA1C 8.7 01/07/2012    HTN - doing well. Will add hctz to her BP ipill for better control of ankle edema.  Unfortunately her insurance runs out at the end of the month so she will not be recommended for couple months until  she finds her placement insurance. She may be of ago onto her husband will likely be a 60-90 delay on this. Normally I would like to see her back in 6 weeks to make sure she is doing well on the HCTZ, but I will follow her up in 3 months.  Check BMP in one week on the diuretic.   Obesity - We discussed the importance of working on exercise and healthy diet.

## 2012-01-09 ENCOUNTER — Other Ambulatory Visit: Payer: Self-pay | Admitting: Family Medicine

## 2012-01-10 ENCOUNTER — Other Ambulatory Visit: Payer: Self-pay | Admitting: *Deleted

## 2012-01-10 MED ORDER — INSULIN PEN NEEDLE 30G X 8 MM MISC: 1.0000 | Status: DC | PRN

## 2012-02-12 ENCOUNTER — Other Ambulatory Visit: Payer: Self-pay | Admitting: *Deleted

## 2012-02-12 MED ORDER — MECLIZINE HCL 25 MG PO TABS: 25.0000 mg | ORAL_TABLET | Freq: Three times a day (TID) | ORAL | Status: DC | PRN

## 2012-02-12 NOTE — Telephone Encounter (Signed)
Pt has called request a refill for Meclizine. It was filled last 07/2011. Please advise if we can refill.

## 2012-02-12 NOTE — Telephone Encounter (Signed)
Ok to fill 

## 2012-03-07 ENCOUNTER — Other Ambulatory Visit: Payer: Self-pay | Admitting: Family Medicine

## 2012-03-12 ENCOUNTER — Other Ambulatory Visit: Payer: Self-pay | Admitting: Family Medicine

## 2012-03-14 ENCOUNTER — Other Ambulatory Visit: Payer: Self-pay | Admitting: *Deleted

## 2012-03-14 MED ORDER — NEEDLES & SYRINGES MISC: Status: DC

## 2012-03-21 ENCOUNTER — Ambulatory Visit (INDEPENDENT_AMBULATORY_CARE_PROVIDER_SITE_OTHER): Payer: 59 | Admitting: Family Medicine

## 2012-03-21 ENCOUNTER — Encounter: Payer: Self-pay | Admitting: Family Medicine

## 2012-03-21 VITALS — BP 170/77 | HR 82 | Wt 383.0 lb

## 2012-03-21 DIAGNOSIS — E669 Obesity, unspecified: Secondary | ICD-10-CM

## 2012-03-21 DIAGNOSIS — H9319 Tinnitus, unspecified ear: Secondary | ICD-10-CM

## 2012-03-21 DIAGNOSIS — R635 Abnormal weight gain: Secondary | ICD-10-CM

## 2012-03-21 DIAGNOSIS — I10 Essential (primary) hypertension: Secondary | ICD-10-CM

## 2012-03-21 DIAGNOSIS — R911 Solitary pulmonary nodule: Secondary | ICD-10-CM

## 2012-03-21 NOTE — Progress Notes (Signed)
Subjective:    Patient ID: Natalie Wright, female    DOB: 1950-07-17, 61 y.o.   MRN: [id number]  HPI Buzzing in both ears. Does have have decreased hearing as well.  Says usually seem to be seasonal.  She has been wearing her CPAP. She has been taking oral antihistamine regularly. She is off of her Flonase. She says her right ear is worse compared her left.  Obesity -she is excited because she is entering a program to potentially get bariatric surgery. She seen Dr. Lily Peer at [facility]. She had tried to get weight loss surgery several years ago her insurance would not cover it. She actually lost her job a couple months ago and found out that she actually did have [employer] and a [employer] does potentially cover weight loss surgery. She is Re: had an appointment with Dr. Lily Peer. They have discussed Roux-en-Y surgery for weight loss. He recommended that she follow with Dr. Jamelle Rushing to see if she could come off of her Plavix as I thought she may have had a small TIA years ago. She also had a CT MG a couple years ago showing some lung nodules. We ordered a CT of the chest to follow this up is recommended as one of the lesions was 7 mm in size but she never went because of insurance reasons. She says she needs to schedule that so that we can make sure that this is normal. She also has an appointment coming up with their nutritionist as well as an exercise therapist. She has already seen the psychiatrist there. She would like to see me monthly for anywhere between 3-6 months to document supervised weight loss for bariatric surgery. having difficuly with walking bc of pain in her knees from her weight.  Says her goal weight is around 200 lbs.  Not counting her calories. Has appt coming up w/ the nutritionist.  Says has a long car ride/comunte to work for 1.5 hr each way.  Hard to prepare foods.  Has been stopping to go through the drive thru.   Review of Systems     Objective:   Physical Exam    Constitutional: She is oriented to person, place, and time. She appears well-developed and well-nourished.  HENT:  Head: Normocephalic and atraumatic.       Right canal is impacted with cerumen. Unable to visualize the tympanic membrane. Left canal and TM are normal.  Cardiovascular: Normal rate, regular rhythm and normal heart sounds.   Pulmonary/Chest: Effort normal and breath sounds normal.  Neurological: She is alert and oriented to person, place, and time.  Skin: Skin is warm and dry.  Psychiatric: She has a normal mood and affect. Her behavior is normal.          Assessment & Plan:  Obesity/Abnormal Weight Gain - Todays weight is 383. I be happy to work with her for the next 3-6 months documenting supervised weight loss. I think she'll be a great candidate for bariatric surgery. She does have diabetes, morbid obesity, hypertension, Reynaud, chronic knee pain. Please see the plan below. Please see below set her goals. We will address again in one month. 1.   Diet - Plans to see the nutritionist in October.  Work on taking healthy meal for lunch daily. Discussed workin on meal prep on the weekend and then divy out into portioned plastic container. She has cut back to Pepsi NEXT (only 10 calories).   2.  Calorie counting. Discussed [employer] PAL.  3.  rx meds -Not a candidate bc of BP right now, but maybe in the future 4.   Exercise - She wants to meet with exercise specialist. Work on chair exercises that she can start exercises.   5.  Behavior modication - Says plans to stop eating in front of the TV. Drinking more water with meals.   F/u in 1 month.   Hypertension-not well controlled. She s just received a new bottle of lisinopril HCT. Increase to 2 tabs daily. This should also help with her ankle swelling.  Nodule seen on chest CT. We will schedule for repeat exam to make sure they have not changed.  Tinnitus/Cerumen impaction-  Right ear was irrigated.. If she continues to have  buzzing and hearing issues and recommend add nasal steroid spray to her allergy medication. If this is still not helpful then consider further referral to ENT.

## 2012-03-25 ENCOUNTER — Other Ambulatory Visit: Payer: Self-pay | Admitting: Family Medicine

## 2012-04-07 ENCOUNTER — Ambulatory Visit (HOSPITAL_BASED_OUTPATIENT_CLINIC_OR_DEPARTMENT_OTHER)
Admission: RE | Admit: 2012-04-07 | Discharge: 2012-04-07 | Disposition: A | Discharge status: Home / Self Care | Source: Ambulatory Visit | Attending: Family Medicine | Admitting: Family Medicine

## 2012-04-07 ENCOUNTER — Ambulatory Visit: Payer: Self-pay | Admitting: Family Medicine

## 2012-04-07 DIAGNOSIS — R0602 Shortness of breath: Secondary | ICD-10-CM | POA: Insufficient documentation

## 2012-04-07 DIAGNOSIS — I10 Essential (primary) hypertension: Secondary | ICD-10-CM | POA: Insufficient documentation

## 2012-04-07 DIAGNOSIS — R911 Solitary pulmonary nodule: Secondary | ICD-10-CM | POA: Insufficient documentation

## 2012-04-07 DIAGNOSIS — E119 Type 2 diabetes mellitus without complications: Secondary | ICD-10-CM | POA: Insufficient documentation

## 2012-04-08 ENCOUNTER — Ambulatory Visit: Payer: Self-pay | Admitting: Family Medicine

## 2012-04-18 ENCOUNTER — Encounter: Payer: Self-pay | Admitting: Family Medicine

## 2012-04-18 ENCOUNTER — Ambulatory Visit (INDEPENDENT_AMBULATORY_CARE_PROVIDER_SITE_OTHER): Admitting: Family Medicine

## 2012-04-18 VITALS — BP 119/64 | HR 64 | Wt 375.0 lb

## 2012-04-18 DIAGNOSIS — IMO0001 Reserved for inherently not codable concepts without codable children: Secondary | ICD-10-CM

## 2012-04-18 DIAGNOSIS — R635 Abnormal weight gain: Secondary | ICD-10-CM

## 2012-04-18 DIAGNOSIS — H269 Unspecified cataract: Secondary | ICD-10-CM | POA: Insufficient documentation

## 2012-04-18 NOTE — Progress Notes (Signed)
Subjective:    Patient ID: Natalie Wright, female    DOB: 14-Dec-1950, 62 y.o.   MRN: [id number]  HPI DM- Says has had 8 nights of hypoglycemic events.  HAs been seeing the dietician. It is usually at 4-5 in the AM.  She dropped her lantus to 25-25 units from 50 units.  AM sugars runnin 115-150s.    Obesity - she continues to work on weight loss. She has quit drinking Pepsi completely. She still try and work on getting some water in her diet. 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 She has tried to work on eating her dinner in the kitchen and stated in front of the TV and has made some strides in this there she's not consistent. Working with a nutritionist she was logging her calories but now she's using my fitness PAL. She is not currently exercising.     Review of Systems     Objective:   Physical Exam  Constitutional: She is oriented to person, place, and time. She appears well-developed and well-nourished.  HENT:  Head: Normocephalic and atraumatic.  Cardiovascular: Normal rate, regular rhythm and normal heart sounds.   Pulmonary/Chest: Effort normal and breath sounds normal.  Neurological: She is alert and oriented to person, place, and time.  Skin: Skin is warm and dry.  Psychiatric: She has a normal mood and affect. Her behavior is normal.          Assessment & Plan:  DM- Uncontrolled. Congratulated her on her weight loss. She is doing great.  I think 25 units of lantus is not enough.  Start with 35 adn can go up or down by a couple of units if needed. Onlu using her apidra once a day and may have to add to a second meal. If her fasting are good but post meals are high then will need to use the apidra consistantly.    Obesity/Abnormal Weight Gain - Todays weight is 375. She has lost 8 pounds in the last 4 weeks. I am  happy to continue to work with her for the next 3-6 months documenting supervised weight loss. I  think she'll be a great candidate for bariatric surgery. She does have diabetes, morbid obesity, hypertension, Reynaud, chronic knee pain. Please see the plan below.  Please see below set her goals.  We will address again in one month.   1. Diet - Has started seeing  nutritionist in October. Work on taking healthy meal for lunch daily. Feels she is doing this 80% of the time.  Discussed working on meal prep on the weekend and then divy out into portioned plastic container. She is off the  Pepsi and all soda products.  2. Calorie counting. Started [employer] PAL this week.  3. rx meds -her blood pressures actually very well controlled today which is wonderful. I think she could be a candidate for phentermine. If her blood pressure still is well controlled at follow up in one month and we can discuss this further. 4. Exercise - She wants to meet with exercise specialist. Says was supposed to have a test.  Work on chair exercises that she can start exercises.  5. Behavior modication - Continue to worik on dinner in her kitchen and said of in front of the TV.  Still strugging with drinking more water with meals.  F/u in 1 month.   25 min spent. >50% in counseling for obesity/Diabetes

## 2012-04-25 ENCOUNTER — Ambulatory Visit: Payer: Self-pay | Admitting: Family Medicine

## 2012-05-05 ENCOUNTER — Other Ambulatory Visit: Payer: Self-pay

## 2012-05-05 MED ORDER — INDAPAMIDE 2.5 MG PO TABS: 2.5000 mg | ORAL_TABLET | Freq: Every day | ORAL | Status: DC

## 2012-05-06 LAB — LIPID PANEL
HDL: 40 mg/dL (ref >39)
LDL Cholesterol: 113 mg/dL — ABNORMAL HIGH (ref 0–99)
Total CHOL/HDL Ratio: 4.4 Ratio
Triglycerides: 106 mg/dL (ref <150)
VLDL: 21 mg/dL (ref 0–40)

## 2012-05-06 LAB — COMPLETE METABOLIC PANEL WITH GFR
ALT: 16 U/L (ref 0–35)
Alkaline Phosphatase: 73 U/L (ref 39–117)
Creat: 0.73 mg/dL (ref 0.50–1.10)
GFR, Est African American: >89 mL/min
Sodium: 139 mEq/L (ref 135–145)
Total Bilirubin: 0.4 mg/dL (ref 0.3–1.2)
Total Protein: 6.8 g/dL (ref 6.0–8.3)

## 2012-05-07 ENCOUNTER — Other Ambulatory Visit: Payer: Self-pay | Admitting: *Deleted

## 2012-05-09 ENCOUNTER — Telehealth: Payer: Self-pay | Admitting: *Deleted

## 2012-05-09 NOTE — Telephone Encounter (Signed)
They do make an extra long needle. Let's call this into the pharmacy for her and have her try this.

## 2012-05-09 NOTE — Telephone Encounter (Signed)
Have her bring it in (make appt) and lets see.  She should be abl to get this in her skin so maybe we just need to work together to figure it out.

## 2012-05-09 NOTE — Telephone Encounter (Signed)
Pt calls and states that can not get needle for Lantus in skin has tried abdomen, arms and legs and needle will not penetrate the skin. Please advise. Has the Novofine 0.3x 8mm

## 2012-05-09 NOTE — Telephone Encounter (Signed)
Called [facility] and spoke with pharmacist and the longest they have is the 8mm and she is using the largest Gauge that they make anymore.

## 2012-05-13 NOTE — Telephone Encounter (Signed)
Pt.notified

## 2012-05-19 ENCOUNTER — Other Ambulatory Visit: Payer: Self-pay | Admitting: *Deleted

## 2012-05-19 MED ORDER — LISINOPRIL-HYDROCHLOROTHIAZIDE 20-12.5 MG PO TABS: 2.0000 | ORAL_TABLET | Freq: Every day | ORAL | Status: DC

## 2012-05-19 MED ORDER — INDAPAMIDE 2.5 MG PO TABS: 2.5000 mg | ORAL_TABLET | Freq: Every day | ORAL | Status: DC

## 2012-05-21 ENCOUNTER — Ambulatory Visit: Admitting: Family Medicine

## 2012-06-02 ENCOUNTER — Ambulatory Visit: Admitting: Family Medicine

## 2012-06-23 ENCOUNTER — Ambulatory Visit (INDEPENDENT_AMBULATORY_CARE_PROVIDER_SITE_OTHER): Admitting: Physician Assistant

## 2012-06-23 ENCOUNTER — Encounter: Payer: Self-pay | Admitting: Physician Assistant

## 2012-06-23 VITALS — BP 119/60 | HR 78 | Temp 97.9°F | Ht 61.0 in | Wt 370.0 lb

## 2012-06-23 DIAGNOSIS — M2550 Pain in unspecified joint: Secondary | ICD-10-CM

## 2012-06-23 DIAGNOSIS — M6530 Trigger finger, unspecified finger: Secondary | ICD-10-CM

## 2012-06-23 DIAGNOSIS — E119 Type 2 diabetes mellitus without complications: Secondary | ICD-10-CM

## 2012-06-23 MED ORDER — INSULIN GLARGINE 100 UNIT/ML ~~LOC~~ SOLN: 35.0000 [IU] | Freq: Every day | SUBCUTANEOUS | Status: DC

## 2012-06-23 MED ORDER — LISINOPRIL-HYDROCHLOROTHIAZIDE 20-12.5 MG PO TABS: 2.0000 | ORAL_TABLET | Freq: Every day | ORAL | Status: DC

## 2012-06-23 MED ORDER — CLOPIDOGREL BISULFATE 75 MG PO TABS: 75.0000 mg | ORAL_TABLET | Freq: Every day | ORAL | Status: DC

## 2012-06-23 MED ORDER — RANITIDINE HCL 150 MG PO TABS: 150.0000 mg | ORAL_TABLET | Freq: Two times a day (BID) | ORAL | Status: DC

## 2012-06-23 MED ORDER — METFORMIN HCL 1000 MG PO TABS: 1000.0000 mg | ORAL_TABLET | Freq: Two times a day (BID) | ORAL | Status: DC

## 2012-06-23 MED ORDER — GLYBURIDE MICRONIZED 6 MG PO TABS: 6.0000 mg | ORAL_TABLET | Freq: Two times a day (BID) | ORAL | Status: DC

## 2012-06-23 MED ORDER — INDAPAMIDE 2.5 MG PO TABS: 2.5000 mg | ORAL_TABLET | Freq: Every day | ORAL | Status: DC

## 2012-06-23 NOTE — Patient Instructions (Addendum)
Trigger Finger Trigger finger (digital tendinitis and stenosing tenosynovitis) is a common disorder that causes an often painful catching of the fingers or thumb. It occurs as a clicking, snapping or locking of a finger in the palm of the hand. The reason for this is that there is a problem with the tendons which flex the fingers sliding smoothly through their sheaths. The cause of this may be inflammation of the tendon and sheath, or from a thickening or nodule in the tendon. The condition may occur in any finger or a couple fingers at the same time. The cause may be overuse while doing the same activity over and over again with your hands.  Tendons are the tough cords that connect the muscles to bones. Muscles and tendons are part of the system which allows your body to move. When muscles contract in the forearm on the palm side, they pull the tendons toward the elbow and cause the fingers and thumb to bend (flex) toward the palm. These are the flexor tendons. The tendons slide through a slippery smooth membrane (synovium) which is called the tendon sheath. The sheaths have areas of tough fibrous tissues surrounding them which hold the tendons close to the bone. These are called pulleys because they work like a pulley. The first pulley is in the palm of the hand near the crease which runs across your palm. If the area of the tendon thickening is near the pulley, the tendon cannot slide smoothly through the pulley and this causes the trigger finger. The finger may lock with the finger curled or suddenly straighten out with a snap. This is more common in patients with rheumatoid arthritis and diabetes. Left untreated, the condition may get worse to the point where the finger becomes locked in flexion, like making a fist, or less commonly locked with the finger straightened out. DIAGNOSIS  Your caregiver will easily make this diagnosis on examination. TREATMENT   Splinting for 6 to 8 weeks of time may be  helpful. Use the splints as your caregiver suggests.  Heat used for twenty minutes at least four times a day followed by ice packs for twenty minutes unless directed otherwise by your caregiver may be helpful. If you find either heat or cold seems to be making the problem worse, quit using them and ask your caregiver for directions.  Cortisone injections along with splinting may speed up recovery. Several injections may be required. Cortisone may give relief after one injection.  Only take over-the-counter or prescription medicines for pain, discomfort, or fever as directed by your caregiver.  Surgery is another treatment that may be used if conservative treatments using injection and splinting does not work. Surgery can be minor without incisions (a cut does not have to be made) and can be done with a needle through the skin. No stitches are needed and most patients may return to work the same day.  Other surgical choices involve an open procedure where the surgeon opens the hand through a small incision (cut) and cuts the pulley so the tendon can again slide smoothly. Your hand will still work fine. This small operation requires stitches and the recovery will be a little longer and the incisions will need to be protected until completely healed. You may have to limit your activities for up to 6 months.  Occupational or hand therapy may be required if there is stiffness remaining in the finger. RISKS AND COMPLICATIONS Complications are uncommon but some problems that may occur are:    Recurrence of the trigger finger. This does not mean that the surgery was not well done. It simply means that you may have formed scar tissue following surgery that causes the problem to reoccur.  Infection which could ruin the results of the surgery and can result in a finger which is frozen and can not move normally.  Nerve injury is possible which could result in permanent numbness of one or more fingers. CARE  AFTER SURGERY  Elevate your hand above your heart and use ice as instructed.  Follow instructions regarding finger motion/exercise.  Keep the surgical wound dry for at least 48 hrs or longer if instructed.  Keep your follow-up appointments.  Return to work and normal activities as instructed. SEEK IMMEDIATE MEDICAL CARE IF:  Your problems are getting worse or you do not obtain relief from the treatment. Document Released: 04/07/2004 Document Revised: 09/10/2011 Document Reviewed: 11/30/2008 Exit[employer] Patient Information 2013 [employer].  

## 2012-06-23 NOTE — Progress Notes (Signed)
  Subjective:    Patient ID: Natalie Wright, female    DOB: 1951/06/14, 61 y.o.   MRN: [id number]  HPI Patient presents to the clinic today to discuss joint catching of left hand and needs some medication refilled and lantus changed to vial.  She is not due for a DM follow up until next month. She needs refill until then. She also cannot use the pens anymore because they do not penetrate her skin. She wants to start using vial and insulin needles. Fasting sugars are 105. She admits to not checking any other time. She is taking medications as directed. She is still on [facility] for baratric surgery sometime next year.  Pt complains of left hand ring and middle fingers 'getting stuck" in the mornings mostly but sometimes throughout the day. She has to manually get the left joints unstuck. She has a lot of stiffness that gets better throughout the day of hand joints. She has not noticed any swelling of joints. NO hx of RA in family. She denies any numbness or tingling of hands. She has not had any wrist pain. She has not really tried anything to make better she just noticing that it is getting worse. There is no pain associated unless fingers our stuck.      Review of Systems     Objective:   Physical Exam  Constitutional: She is oriented to person, place, and time. She appears well-developed and well-nourished.       Morbidly obese.  HENT:  Head: Normocephalic and atraumatic.  Cardiovascular: Normal rate, regular rhythm and normal heart sounds.   Pulmonary/Chest: Effort normal and breath sounds normal.  Musculoskeletal:       Normal ROM of bilateral hands. No joint swelling. No tenderness to palpation of joints of hands. Strength 5/5 bilaterally. Neg phalens and tinels. No clicking or popping of left hand ring and middle finger today.  Neurological: She is alert and oriented to person, place, and time.  Skin: Skin is warm and dry.  Psychiatric: She has a normal mood and affect. Her behavior is  normal.          Assessment & Plan:  Trigger finger- Reassured pt that is suspect that is what problem is. She questioned about RA. I do not see any joint swelling or deformity. I discussed if not improving or new symptom of joint swelling would check RA work up. Did give splint for patient to wear as much as she could for next 4-6 weeks. Encouraged rest of joint. Ice when able. Gave handout and dicussed referral if needed. Pt does not take a lot of NSAIDS because of hx of stomach problems. She can take aleve as needed. Encouraged her to take more regularly and if stomach problems start then stop.   DM- Meds refilled. Rx for lantus changed to vial. Follow up with Dr. Linford Arnold in 1 month. For A1C recheck. Fasting appear great. Need to work on post prandial checking of sugar and control.

## 2012-07-04 ENCOUNTER — Ambulatory Visit (INDEPENDENT_AMBULATORY_CARE_PROVIDER_SITE_OTHER): Admitting: Family Medicine

## 2012-07-04 ENCOUNTER — Encounter: Payer: Self-pay | Admitting: Family Medicine

## 2012-07-04 VITALS — BP 128/62 | HR 91 | Resp 18 | Wt 370.0 lb

## 2012-07-04 DIAGNOSIS — R635 Abnormal weight gain: Secondary | ICD-10-CM

## 2012-07-04 DIAGNOSIS — Z23 Encounter for immunization: Secondary | ICD-10-CM

## 2012-07-04 MED ORDER — OMEPRAZOLE 40 MG PO CPDR: 40.0000 mg | DELAYED_RELEASE_CAPSULE | Freq: Every day | ORAL | Status: DC

## 2012-07-04 NOTE — Patient Instructions (Signed)
1. Diet - Did struggle over the holidays.  Work on taking healthy meal for lunch daily. Discussed working on meal prep on the weekend and then divy out into portioned plastic container. She is off the Pepsi completely. Has been waiting 30 seconda between bites.  Working on adequate protein intake.   2. Calorie counting. Goal is 1500.  She is using [employer] PAL.  3. rx meds -BP well controlled today, discussed options for rx meds , but maybe in the future. Had had some exeperience with phentermine in hte past and had heart racing with it.  4. Exercise - She has meet with exercise specialist. She hs been working on her recumbant bike for 15 min ( 5 days per week).  5. Behavior modication - Sitting at the table to eat meals.  Chewing foods for 30 seconds.. Drinking more water with meals.  F/u in 1 month.

## 2012-07-04 NOTE — Progress Notes (Signed)
Subjective:    Patient ID: Natalie Wright, female    DOB: [birth year], 62 y.o.   MRN: [id number]  HPI Obesity -she is excited because she is entering a program to potentially get bariatric surgery. She seen Dr. Lily Peer at [facility]. She had tried to get weight loss surgery several years ago her insurance would not cover it. She actually lost her job a couple months ago and found out that she actually did have [employer] and a [employer] does potentially cover weight loss surgery. She is Re: had an appointment with Dr. Lily Peer. They have discussed Roux-en-Y surgery for weight loss. He recommended that she follow with Dr. Jamelle Rushing to see if she could come off of her Plavix as I thought she may have had a small TIA years ago. She also had a CT MG a couple years ago showing some lung nodules. We ordered a CT of the chest to follow this up is recommended as one of the lesions was 7 mm in size but she never went because of insurance reasons. She says she needs to schedule that so that we can make sure that this is normal. She also has an appointment coming up with their nutritionist as well as an exercise therapist. She has already seen the psychiatrist there. She would like to see me monthly for anywhere between 3-6 months to document supervised weight loss for bariatric surgery. having difficuly with walking bc of pain in her knees from her weight. Says her goal weight is around 200 lbs. Not counting her calories. Has appt coming up w/ the nutritionist. Says has a long car ride/comunte to work for 1.5 hr each way. Hard to prepare foods.   She has been working on the recumbant bike. Started with 10- min nad up to 15 min now 5 days per week.      Review of Systems     Objective:   Physical Exam  Constitutional: She is oriented to person, place, and time. She appears well-developed and well-nourished.  HENT:  Head: Normocephalic and atraumatic.  Cardiovascular: Normal rate, regular rhythm and normal  heart sounds.   Pulmonary/Chest: Effort normal and breath sounds normal.  Neurological: She is alert and oriented to person, place, and time.  Skin: Skin is warm and dry.  Psychiatric: She has a normal mood and affect. Her behavior is normal.          Assessment & Plan:   Assessment & Plan:   Obesity/Abnormal Weight Gain - Todays weight is 370.  She is down 5 lbs form October considering the [place]. I be happy to work with her for the next 3-6 months documenting supervised weight loss. I think she'll be a great candidate for bariatric surgery. She does have diabetes, morbid obesity, hypertension, Reynaud, chronic knee pain. Please see the plan below. Please see below set her goals. We will address again in one month.  1. Diet - Did struggle over the holidays.  Work on taking healthy meal for lunch daily. Discussed working on meal prep on the weekend and then divy out into portioned plastic container. She is off the Pepsi completely. Has been waiting 30 seconda between bites.  Working on adequate protein intake.   2. Calorie counting. Goal is 1500.  She is using [employer] PAL.  3. rx meds -BP well controlled today, discussed options for rx meds , but maybe in the future. Had had some exeperience with phentermine in hte past and had heart racing with it.  4.  Exercise - She has meet with exercise specialist. She hs been working on her recumbant bike for 15 min ( 5 days per week).  5. Behavior modication - Sitting at the table to eat meals.  Chewing foods for 30 seconds.. Drinking more water with meals.  F/u in 1 month.

## 2012-07-11 ENCOUNTER — Other Ambulatory Visit: Payer: Self-pay

## 2012-07-11 ENCOUNTER — Other Ambulatory Visit: Payer: Self-pay | Admitting: Physician Assistant

## 2012-07-11 MED ORDER — INDAPAMIDE 2.5 MG PO TABS: 2.5000 mg | ORAL_TABLET | Freq: Every day | ORAL | Status: DC

## 2012-07-11 MED ORDER — GLYBURIDE MICRONIZED 6 MG PO TABS: 6.0000 mg | ORAL_TABLET | Freq: Two times a day (BID) | ORAL | Status: DC

## 2012-07-11 MED ORDER — LISINOPRIL-HYDROCHLOROTHIAZIDE 20-12.5 MG PO TABS: 2.0000 | ORAL_TABLET | Freq: Every day | ORAL | Status: DC

## 2012-07-11 MED ORDER — CLOPIDOGREL BISULFATE 75 MG PO TABS: 75.0000 mg | ORAL_TABLET | Freq: Every day | ORAL | Status: DC

## 2012-07-11 MED ORDER — RANITIDINE HCL 150 MG PO TABS: 150.0000 mg | ORAL_TABLET | Freq: Two times a day (BID) | ORAL | Status: DC

## 2012-07-11 MED ORDER — METFORMIN HCL 1000 MG PO TABS: 1000.0000 mg | ORAL_TABLET | Freq: Two times a day (BID) | ORAL | Status: DC

## 2012-07-11 MED ORDER — INSULIN GLARGINE 100 UNIT/ML ~~LOC~~ SOLN: 35.0000 [IU] | Freq: Every day | SUBCUTANEOUS | Status: DC

## 2012-08-01 ENCOUNTER — Ambulatory Visit: Admitting: Family Medicine

## 2012-08-28 ENCOUNTER — Other Ambulatory Visit: Payer: Self-pay | Admitting: Family Medicine

## 2012-10-24 ENCOUNTER — Other Ambulatory Visit: Payer: Self-pay | Admitting: Family Medicine

## 2013-01-08 ENCOUNTER — Other Ambulatory Visit: Payer: Self-pay

## 2013-01-09 ENCOUNTER — Ambulatory Visit (INDEPENDENT_AMBULATORY_CARE_PROVIDER_SITE_OTHER): Admitting: Family Medicine

## 2013-01-09 ENCOUNTER — Encounter: Payer: Self-pay | Admitting: Family Medicine

## 2013-01-09 VITALS — BP 150/68 | HR 95 | Ht 61.0 in | Wt 386.0 lb

## 2013-01-09 DIAGNOSIS — Z111 Encounter for screening for respiratory tuberculosis: Secondary | ICD-10-CM

## 2013-01-09 DIAGNOSIS — M25569 Pain in unspecified knee: Secondary | ICD-10-CM

## 2013-01-09 DIAGNOSIS — IMO0001 Reserved for inherently not codable concepts without codable children: Secondary | ICD-10-CM

## 2013-01-09 DIAGNOSIS — I10 Essential (primary) hypertension: Secondary | ICD-10-CM

## 2013-01-09 DIAGNOSIS — M25562 Pain in left knee: Secondary | ICD-10-CM

## 2013-01-09 DIAGNOSIS — M25561 Pain in right knee: Secondary | ICD-10-CM

## 2013-01-09 DIAGNOSIS — G4733 Obstructive sleep apnea (adult) (pediatric): Secondary | ICD-10-CM

## 2013-01-09 DIAGNOSIS — M6530 Trigger finger, unspecified finger: Secondary | ICD-10-CM

## 2013-01-09 DIAGNOSIS — K219 Gastro-esophageal reflux disease without esophagitis: Secondary | ICD-10-CM

## 2013-01-09 LAB — POCT GLYCOSYLATED HEMOGLOBIN (HGB A1C): Hemoglobin A1C: 8

## 2013-01-09 MED ORDER — INSULIN GLARGINE 100 UNIT/ML ~~LOC~~ SOLN: SUBCUTANEOUS | Status: DC

## 2013-01-09 MED ORDER — MECLIZINE HCL 25 MG PO TABS: 25.0000 mg | ORAL_TABLET | Freq: Three times a day (TID) | ORAL | Status: DC | PRN

## 2013-01-09 MED ORDER — CANAGLIFLOZIN 100 MG PO TABS: 100.0000 mg | ORAL_TABLET | Freq: Every day | ORAL | Status: DC

## 2013-01-09 MED ORDER — INSULIN GLARGINE 100 UNIT/ML ~~LOC~~ SOLN: 35.0000 [IU] | Freq: Every day | SUBCUTANEOUS | Status: DC

## 2013-01-09 MED ORDER — AMBULATORY NON FORMULARY MEDICATION: Status: DC

## 2013-01-09 MED ORDER — LORCASERIN HCL 10 MG PO TABS: 10.0000 mg | ORAL_TABLET | Freq: Every morning | ORAL | Status: DC

## 2013-01-09 NOTE — Progress Notes (Signed)
Subjective:    Patient ID: Natalie Wright, female    DOB: 1950-08-23, 62 y.o.   MRN: [id number]  HPI DM- followup diabetes. She has not been seen since October of last year. She is using lantus regularly.  She has been having occasional hypoglycemic events around 3-4 the morning. She typically gets her evening Lantus from 60 7 PM and then her second dose around 7 AM. She typically gets between 35-40 units. She'll at times with eating more sugary items at night so that she doesn't have the hypoglycemic events.  HTN-  Pt denies chest pain, SOB, dizziness, or heart palpitations.  Taking meds as directed w/o problems.  Denies medication side effects.    Obesity - Says thought about it more. She would like to start Belviq.  She reviewed the information online.   GERD - has finished 6 mo of prilosec for gastric ulcer. Sxs well controlled.  Wants to see if still needs medication. Rarely breakthrough sxs.   Bilateral knee pain-she says she know she needs to have a new for placement but wants to know if there are other options to buy her more time. She knows that her current weight that it would not be advised for her to have knee replacement she would likely have poor recovery. Pain is worse with ambulation.    Sleep apnea-[facility] will no longer cover her supplies a, sleep medicine. They have referred her to a company called [facility]. She will need a new orders sent to their supply company. Her current mask has cracked so needs new supplies ASAP.   Needs to have TB skin test placed.  Review of Systems  BP 150/68  Pulse 95  Ht 5\' 1"  (1.549 m)  Wt 386 lb (175.088 kg)  BMI 72.97 kg/m2    Allergies  Allergen Reactions  . Cephalexin   . Lipitor (Atorvastatin Calcium) Other (See Comments)    myalgias  . Pioglitazone     REACTION: ulcers in the mouth  . Pravastatin Other (See Comments)    Chest pain.     Past Medical History  Diagnosis Date  . Hypertension   . Allergy     to  cats and dogs  . Hiatal hernia     and delayed gastric emptying/ sees [facility] for recurrent diarrhea  . Pedal edema   . TIA (transient ischemic attack)     hx of- sees Dr Rayburn Ma (Neurology)  . Spastic bladder     urology in [place]  . OSA (obstructive sleep apnea)     CPAP  . Diabetes mellitus     No past surgical history on file.  History   Social History  . Marital Status: Married    Spouse Name: N/A    Number of Children: 2  . Years of Education: Masters   Occupational History  . Company secretary   Social History Main Topics  . Smoking status: Former Games developer  . Smokeless tobacco: Never Used  . Alcohol Use: Yes  . Drug Use: Yes  . Sexually Active: Not on file   Other Topics Concern  . Not on file   Social History Narrative   Married to [place].  Foster parent    Family History  Problem Relation Age of Onset  . Diabetes Mother   . Hypertension Mother   . Glaucoma Mother   . Cancer Mother     bone cancer  . Alcohol abuse Father   . Emphysema Father   .  Other Father     CHF  . Cancer Sister     ? utereine ca    Outpatient Encounter Prescriptions as of 01/09/2013  Medication Sig Dispense Refill  . aspirin 325 MG tablet Take 325 mg by mouth daily.      . insulin glulisine (APIDRA) 100 UNIT/ML injection Inject into the skin 3 (three) times daily before meals.      . Omega-3 Fatty Acids (FISH OIL) 1200 MG CAPS Take 1 capsule by mouth daily.      Marland Kitchen acetaminophen (TYLENOL) 650 MG CR tablet Take 650 mg by mouth every 8 (eight) hours as needed.        . AMBULATORY NON FORMULARY MEDICATION Medication Name: CPAP with facemask with supplies and humidifier.  Dx: OSA (Test done at [facility]) [facility] [phone], Fax [phone]  1 Units  PRN  . Canagliflozin (INVOKANA) 100 MG TABS Take 1 tablet (100 mg total) by mouth daily.  15 tablet  0  . cetirizine (ZYRTEC) 10 MG tablet Take 10 mg by mouth daily.        . clopidogrel (PLAVIX) 75 MG  tablet Take 1 tablet (75 mg total) by mouth daily.  90 tablet  1  . Ferrous Sulfate Dried (FEOSOL) 200 (65 FE) MG TABS Take 1 tablet by mouth daily.        Marland Kitchen glyBURIDE micronized (GLYNASE) 6 MG tablet Take 1 tablet (6 mg total) by mouth 2 (two) times daily with a meal.  180 tablet  1  . indapamide (LOZOL) 2.5 MG tablet Take 1 tablet (2.5 mg total) by mouth daily.  90 tablet  1  . insulin glargine (LANTUS) 100 UNIT/ML injection INJECT 35 UNITS INTO THE SKIN TWICE A DAY  10 mL  11  . Insulin Pen Needle (BD ULTRA-FINE PEN NEEDLES) 29G X 12.7MM MISC by Does not apply route as directed.       . Insulin Pen Needle (NOVOFINE) 30G X 8 MM MISC Inject 10 each into the skin as needed.  100 each  2  . lisinopril-hydrochlorothiazide (PRINZIDE,ZESTORETIC) 20-12.5 MG per tablet Take 2 tablets by mouth daily.  180 tablet  1  . Lorcaserin HCl (BELVIQ) 10 MG TABS Take 10 mg by mouth every morning.  15 tablet  0  . meclizine (ANTIVERT) 25 MG tablet Take 1 tablet (25 mg total) by mouth 3 (three) times daily as needed for dizziness or nausea.  90 tablet  1  . metFORMIN (GLUCOPHAGE) 1000 MG tablet Take 1 tablet (1,000 mg total) by mouth 2 (two) times daily with a meal.  180 tablet  1  . omeprazole (PRILOSEC) 40 MG capsule TAKE 1 CAPSULE (40 MG TOTAL) BY MOUTH DAILY. DIAGNOSIS EROSIVE GASTRITIS.  30 capsule  1  . ranitidine (ZANTAC) 150 MG tablet Take 1 tablet (150 mg total) by mouth 2 (two) times daily.  180 tablet  1  . [DISCONTINUED] insulin glargine (LANTUS) 100 UNIT/ML injection Inject 35 Units into the skin at bedtime.  40 mL  1  . [DISCONTINUED] insulin glargine (LANTUS) 100 UNIT/ML injection Inject 0.35 mLs (35 Units total) into the skin at bedtime.  40 mL  1  . [DISCONTINUED] insulin glargine (LANTUS) 100 UNIT/ML injection INJECT 35 UNITS INTO THE SKIN TWICE A DAY  10 mL  0  . [DISCONTINUED] LANTUS 100 UNIT/ML injection INJECT 35 UNITS INTO THE SKIN AT BEDTIME.  10 mL  0  . [DISCONTINUED] Lorcaserin HCl (BELVIQ)  10 MG TABS Take 10 mg  by mouth every morning.  14 tablet  0  . [DISCONTINUED] meclizine (ANTIVERT) 25 MG tablet Take 1 tablet (25 mg total) by mouth 3 (three) times daily as needed for dizziness or nausea.  30 tablet  0   No facility-administered encounter medications on file as of 01/09/2013.           Objective:   Physical Exam  Constitutional: She is oriented to person, place, and time. She appears well-developed and well-nourished.  HENT:  Head: Normocephalic and atraumatic.  Cardiovascular: Normal rate, regular rhythm and normal heart sounds.   Pulmonary/Chest: Effort normal and breath sounds normal.  Neurological: She is alert and oriented to person, place, and time.  Skin: Skin is warm and dry.  Psychiatric: She has a normal mood and affect. Her behavior is normal.          Assessment & Plan:  DM- Uncontrolled. Had eye exam at [facility].  DUe for urine micro and foot exam.  Needs refills on Lantus. It sounds as she's having a lot of problems regulating her blood glucose. She does a great job eating well she actually tends to go low. But her A1c today is 8.0 which is still not at goal. We actually discussed decreasing her Lantus to 34 units at bedtime instead of at 6 or 7 PM. She's actually take it at 9 or 10 PM. And then to take 30 units at 7 AM before she goes to work. We will see if this helps avoid the 3 to 4 AM hypoglycemic event she's been experiencing. Also will add Invokana. Discussed the potential risks and benefits of the medication. We'll start with 100 mg daily in the morning. If she tolerates the 15 days of samples well she can call the office and we will send her for prescription. Also gave her a co-pay coupon card today.  HTN- Uncontrolled. Will recheck at followup in one month.  GERD - Wean PPI to every other day for 2 months then every 3 days for a month and then stop. Warned aout s.E of inc fractures with long term use.   Trigger finger - Will refer  to Dr. Karie Schwalbe for injection  Bilat knee pain- Will refer to Dr. Karie Schwalbe. she may be a candidate for Synvisc injections. I agree that significant weight loss as needed before she considers knee replacement surgery.  Severe obesity-we discussed different options in the past. She did do some further research on the Belviq and would like to start it. I think staying away from stimulant with hx of palpitatios is a good choice.  Given coupon for free 15 day supply. If she tolerates it well without any side effects or problems with consider a prescription to her pharmacy. She did check pricing out and she will have to pay cash Price but didn't find it somewhat reasonable for about $100 at [employer].  OSA - will need to get new orders sent to [facility] for her CPAP supplies. She given a phone number to fax number. Her last sleep test was done, sleep medicine. She's not sure what pressure she uses so we will need to get a copy of this to make sure that was the most with the order.  TB skin test placed for work today. She will followup on Monday to have it read.  Time spent 40 min,> 50% spent counseling about DM, HTN, GERD Trigger finger, bilat knee pain, obesity, weight loss and sleep apnea.

## 2013-01-12 ENCOUNTER — Encounter: Payer: Self-pay | Admitting: *Deleted

## 2013-01-12 LAB — BASIC METABOLIC PANEL WITH GFR
BUN: 16 mg/dL (ref 6–23)
Calcium: 9.6 mg/dL (ref 8.4–10.5)
GFR, Est African American: >89 mL/min
GFR, Est Non African American: 82 mL/min
Potassium: 4.7 mEq/L (ref 3.5–5.3)

## 2013-01-12 LAB — TB SKIN TEST: TB Skin Test: NEGATIVE

## 2013-01-13 NOTE — Progress Notes (Signed)
Quick Note:  All labs are normal. ______ 

## 2013-01-26 ENCOUNTER — Other Ambulatory Visit: Payer: Self-pay

## 2013-01-26 MED ORDER — METFORMIN HCL 1000 MG PO TABS: 1000.0000 mg | ORAL_TABLET | Freq: Two times a day (BID) | ORAL | Status: DC

## 2013-01-26 MED ORDER — LISINOPRIL-HYDROCHLOROTHIAZIDE 20-12.5 MG PO TABS: 2.0000 | ORAL_TABLET | Freq: Every day | ORAL | Status: DC

## 2013-01-26 MED ORDER — CLOPIDOGREL BISULFATE 75 MG PO TABS: 75.0000 mg | ORAL_TABLET | Freq: Every day | ORAL | Status: DC

## 2013-01-26 MED ORDER — OMEPRAZOLE 40 MG PO CPDR: 40.0000 mg | DELAYED_RELEASE_CAPSULE | Freq: Every day | ORAL | Status: DC

## 2013-01-26 MED ORDER — RANITIDINE HCL 150 MG PO TABS: 150.0000 mg | ORAL_TABLET | Freq: Two times a day (BID) | ORAL | Status: DC

## 2013-01-26 MED ORDER — GLYBURIDE MICRONIZED 6 MG PO TABS: 6.0000 mg | ORAL_TABLET | Freq: Two times a day (BID) | ORAL | Status: DC

## 2013-01-26 MED ORDER — INDAPAMIDE 2.5 MG PO TABS: 2.5000 mg | ORAL_TABLET | Freq: Every day | ORAL | Status: DC

## 2013-01-28 ENCOUNTER — Other Ambulatory Visit: Payer: Self-pay | Admitting: Family Medicine

## 2013-01-29 ENCOUNTER — Other Ambulatory Visit: Payer: Self-pay | Admitting: *Deleted

## 2013-01-29 MED ORDER — LISINOPRIL-HYDROCHLOROTHIAZIDE 20-12.5 MG PO TABS: 2.0000 | ORAL_TABLET | Freq: Every day | ORAL | Status: DC

## 2013-01-29 NOTE — Progress Notes (Signed)
[facility] stating [facility] on lisinopril hctz and would like something called to a local pharm. [facility].Natalie Wright, Natalie Wright

## 2013-02-06 ENCOUNTER — Encounter: Payer: Self-pay | Admitting: Family Medicine

## 2013-02-06 ENCOUNTER — Ambulatory Visit (INDEPENDENT_AMBULATORY_CARE_PROVIDER_SITE_OTHER): Admitting: Family Medicine

## 2013-02-06 VITALS — BP 150/90 | HR 64 | Wt 389.0 lb

## 2013-02-06 DIAGNOSIS — I10 Essential (primary) hypertension: Secondary | ICD-10-CM

## 2013-02-06 DIAGNOSIS — Z6841 Body Mass Index (BMI) 40.0 and over, adult: Secondary | ICD-10-CM

## 2013-02-06 DIAGNOSIS — IMO0001 Reserved for inherently not codable concepts without codable children: Secondary | ICD-10-CM

## 2013-02-06 LAB — POCT GLYCOSYLATED HEMOGLOBIN (HGB A1C): Hemoglobin A1C: 8

## 2013-02-06 MED ORDER — LORCASERIN HCL 10 MG PO TABS: 10.0000 mg | ORAL_TABLET | Freq: Every morning | ORAL | Status: DC

## 2013-02-06 NOTE — Progress Notes (Signed)
  Subjective:    Patient ID: Natalie Wright, female    DOB: 06/26/1951, 62 y.o.   MRN: [id number]  HPI DM- moved lantus to bedtime and that has elimated the lows in the middle of the night.  No wounds tahta aren't healing Noticed some pigmentation between teh 4th adn 5th toes on thr right foot.  Her eye exam is UTD. She has had floaters. She has not started the Invokana yet. She was fearful to take it while starting the Belviq samples. Based on her last hemoglobin A1c I really want her to start the Invokana soon.  HTN-  Pt denies chest pain, SOB, dizziness, or heart palpitations.  Taking meds as directed w/o problems.  Denies medication side effects.      Review of Systems     Objective:   Physical Exam  Constitutional: She is oriented to person, place, and time. She appears well-developed and well-nourished.  HENT:  Head: Normocephalic and atraumatic.  Cardiovascular: Normal rate, regular rhythm and normal heart sounds.   Pulmonary/Chest: Effort normal and breath sounds normal.  Neurological: She is alert and oriented to person, place, and time.  Skin: Skin is warm and dry.  Psychiatric: She has a normal mood and affect. Her behavior is normal.          Assessment & Plan:  DM-uncontrolled. It sounds like she's no longer having any hypoglycemic events. She has not tried the Imdur, yet. She said she did one mix with the Belviq in case she had side effects and she knew which one was causing the symptoms. She says she will start it  about a week or 2 after starting the Belviq.  HTN - uncontrolled.  Obesity - unfortunately her insurance will not cover the Belviq. But she can pay $111 at [employer]. She would like to get an it.   She has not started it yet. She did take the 2 weeks worth of samples just to make sure that she could tolerate it and she did okay. She got a little rough the first few days but improved the longer she took it. Followup in one month to make sure that she's  tolerating it well and losing weight.

## 2013-02-07 ENCOUNTER — Other Ambulatory Visit: Payer: Self-pay | Admitting: Family Medicine

## 2013-03-04 ENCOUNTER — Other Ambulatory Visit: Payer: Self-pay | Admitting: Family Medicine

## 2013-03-06 ENCOUNTER — Ambulatory Visit: Admitting: Family Medicine

## 2013-03-13 ENCOUNTER — Ambulatory Visit: Admitting: Sports Medicine

## 2013-08-12 ENCOUNTER — Ambulatory Visit (INDEPENDENT_AMBULATORY_CARE_PROVIDER_SITE_OTHER): Admitting: Physician Assistant

## 2013-08-12 ENCOUNTER — Encounter: Payer: Self-pay | Admitting: Physician Assistant

## 2013-08-12 VITALS — BP 143/59 | HR 76 | Wt 376.0 lb

## 2013-08-12 DIAGNOSIS — L259 Unspecified contact dermatitis, unspecified cause: Secondary | ICD-10-CM

## 2013-08-12 DIAGNOSIS — L280 Lichen simplex chronicus: Secondary | ICD-10-CM | POA: Insufficient documentation

## 2013-08-12 DIAGNOSIS — IMO0001 Reserved for inherently not codable concepts without codable children: Secondary | ICD-10-CM

## 2013-08-12 DIAGNOSIS — Z6841 Body Mass Index (BMI) 40.0 and over, adult: Secondary | ICD-10-CM

## 2013-08-12 DIAGNOSIS — E119 Type 2 diabetes mellitus without complications: Secondary | ICD-10-CM

## 2013-08-12 DIAGNOSIS — IMO0002 Reserved for concepts with insufficient information to code with codable children: Secondary | ICD-10-CM

## 2013-08-12 DIAGNOSIS — E1165 Type 2 diabetes mellitus with hyperglycemia: Secondary | ICD-10-CM

## 2013-08-12 MED ORDER — INDAPAMIDE 2.5 MG PO TABS: 2.5000 mg | ORAL_TABLET | Freq: Every day | ORAL | Status: DC

## 2013-08-12 MED ORDER — CLOPIDOGREL BISULFATE 75 MG PO TABS: 75.0000 mg | ORAL_TABLET | Freq: Every day | ORAL | Status: DC

## 2013-08-12 MED ORDER — GLYBURIDE MICRONIZED 6 MG PO TABS: 6.0000 mg | ORAL_TABLET | Freq: Two times a day (BID) | ORAL | Status: DC

## 2013-08-12 MED ORDER — METFORMIN HCL 1000 MG PO TABS: 1000.0000 mg | ORAL_TABLET | Freq: Two times a day (BID) | ORAL | Status: DC

## 2013-08-12 NOTE — Patient Instructions (Signed)
Start invokana 100mg  daily.

## 2013-08-13 LAB — COMPLETE METABOLIC PANEL WITH GFR
ALT: 14 U/L (ref 0–35)
AST: 17 U/L (ref 0–37)
Albumin: 4.2 g/dL (ref 3.5–5.2)
Alkaline Phosphatase: 89 U/L (ref 39–117)
BILIRUBIN TOTAL: 0.3 mg/dL (ref 0.2–1.2)
BUN: 12 mg/dL (ref 6–23)
CO2: 31 meq/L (ref 19–32)
Calcium: 9.2 mg/dL (ref 8.4–10.5)
Chloride: 98 mEq/L (ref 96–112)
Creat: 0.69 mg/dL (ref 0.50–1.10)
GFR, Est Non African American: >89 mL/min
Glucose, Bld: 292 mg/dL — ABNORMAL HIGH (ref 70–99)
POTASSIUM: 4.2 meq/L (ref 3.5–5.3)
SODIUM: 139 meq/L (ref 135–145)
TOTAL PROTEIN: 6.6 g/dL (ref 6.0–8.3)

## 2013-08-14 DIAGNOSIS — E1165 Type 2 diabetes mellitus with hyperglycemia: Secondary | ICD-10-CM | POA: Insufficient documentation

## 2013-08-14 DIAGNOSIS — IMO0002 Reserved for concepts with insufficient information to code with codable children: Secondary | ICD-10-CM | POA: Insufficient documentation

## 2013-08-14 DIAGNOSIS — Z6841 Body Mass Index (BMI) 40.0 and over, adult: Secondary | ICD-10-CM

## 2013-08-14 LAB — POCT GLYCOSYLATED HEMOGLOBIN (HGB A1C): HEMOGLOBIN A1C: 9.9

## 2013-08-14 NOTE — Progress Notes (Signed)
   Subjective:    Patient ID: Natalie Wright, female    DOB: 1950/09/12, 63 y.o.   MRN: [id number]  HPI Pt is a 63 yo female who presents to the clinic to follow up on Diabetes and obesity. Checking fasting sugars and averaging 300. Denies any hypoglyemic events. No open or non healing sores. Eye exam UTD. Denies any neuropathy. Taking lantus at bedtime without any more lows. Taking glyburide and metformin. Did not start invokanna. No dieting or exercising. She states she has no self control. She is eating lots of sweets and chocolate. Did not start belviq because not even trying to diet.   HTN- no cP, palpitations, vision changes, Headaches. Taking medication regularly.   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 Per derm no treatment for and no risk. Pt deneis any itching or pain.   Review of Systems     Objective:   Physical Exam  Constitutional: She is oriented to person, place, and time. She appears well-developed and well-nourished.  Morbidly obese.   HENT:  Head: Normocephalic and atraumatic.  Cardiovascular: Normal rate, regular rhythm and normal heart sounds.   Pulmonary/Chest: Effort normal and breath sounds normal.  Neurological: She is alert and oriented to person, place, and time.  Psychiatric: Her behavior is normal.          Assessment & Plan:  DM,uncontrolled- A1C is 9.9 today. Discussed with patient that this is very concerning. Pt is in the range now of having diabetic symptoms. Discussed with pt starting rapid insulin at meals times. She declined. Discussed vicotza or Trulicity once weekly. She declined. I told her she must try invokanna. She must also watch her sugar and carbohydrate load. Pt is aware and voices understanding. I would like her to follow up in one month and assess change. Continue checking fasting glucose, keep log and will go over with metheney.   Obesity- pt states she has no self control. Not taking belviq. Not exercising.  Not watching diet. We can consider contrave but not sure pt will be complaint. Talk about in one month.   HTN- continue on medications. BP better than last visit. Follow up in one month for recheck.   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

## 2013-08-17 ENCOUNTER — Other Ambulatory Visit: Payer: Self-pay | Admitting: Family Medicine

## 2013-09-11 ENCOUNTER — Encounter: Payer: Self-pay | Admitting: Family Medicine

## 2013-09-11 ENCOUNTER — Ambulatory Visit (INDEPENDENT_AMBULATORY_CARE_PROVIDER_SITE_OTHER): Admitting: Family Medicine

## 2013-09-11 VITALS — BP 108/50 | HR 87 | Ht 61.0 in | Wt 373.0 lb

## 2013-09-11 DIAGNOSIS — B3731 Acute candidiasis of vulva and vagina: Secondary | ICD-10-CM

## 2013-09-11 DIAGNOSIS — B373 Candidiasis of vulva and vagina: Secondary | ICD-10-CM

## 2013-09-11 DIAGNOSIS — I10 Essential (primary) hypertension: Secondary | ICD-10-CM

## 2013-09-11 DIAGNOSIS — E1165 Type 2 diabetes mellitus with hyperglycemia: Principal | ICD-10-CM

## 2013-09-11 DIAGNOSIS — IMO0001 Reserved for inherently not codable concepts without codable children: Secondary | ICD-10-CM

## 2013-09-11 MED ORDER — FLUCONAZOLE 150 MG PO TABS: 150.0000 mg | ORAL_TABLET | Freq: Once | ORAL | Status: DC

## 2013-09-11 MED ORDER — CANAGLIFLOZIN 100 MG PO TABS: 100.0000 mg | ORAL_TABLET | Freq: Every day | ORAL | Status: DC

## 2013-09-11 NOTE — Progress Notes (Signed)
Subjective:    Patient ID: Natalie Wright, female    DOB: [birth year], 63 y.o.   MRN: [id number]  HPI Here for Diabetic f/u.   Seen in Feb A1c was 9.9. Started on Invokana. Likes it but feels she is getting yeast infection. Says she plans to schedule her eye exam. Doesn't want any vaccines today.  Has lost 3 more lbs. She has been having some urinary frequency.   Hypertension- Pt denies chest pain, SOB, dizziness, or heart palpitations.  Taking meds as directed w/o problems.  Denies medication side effects.    She feels like she has another yeast infection. She had one earlier this month. She had an old prescription with refills from her OB/GYN and to. It did clear up the infection but she has started noticing some irritation and discharge in the last couple of days again.   Review of Systems     BP 108/50  Pulse 87  Ht 5\' 1"  (1.549 m)  Wt 373 lb (169.192 kg)  BMI 70.51 kg/m2  SpO2 95%    Allergies  Allergen Reactions  . Cephalexin   . Lipitor [Atorvastatin Calcium] Other (See Comments)    myalgias  . Pioglitazone     REACTION: ulcers in the mouth  . Pravastatin Other (See Comments)    Chest pain.     Past Medical History  Diagnosis Date  . Hypertension   . Allergy     to cats and dogs  . Hiatal hernia     and delayed gastric emptying/ sees [facility] for recurrent diarrhea  . Pedal edema   . TIA (transient ischemic attack)     hx of- sees Dr Elwin Mocha (Neurology)  . Spastic bladder     urology in [place]  . OSA (obstructive sleep apnea)     CPAP 11mmhg  . Diabetes mellitus     No past surgical history on file.  History   Social History  . Marital Status: Married    Spouse Name: N/A    Number of Children: 2  . Years of Education: Masters   Occupational History  . Advertising account planner   Social History Main Topics  . Smoking status: Former Research scientist (life sciences)  . Smokeless tobacco: Never Used  . Alcohol Use: Yes  . Drug Use: Yes  . Sexual Activity: Not on file    Other Topics Concern  . Not on file   Social History Narrative   Married to [place].  Foster parent    Family History  Problem Relation Age of Onset  . Diabetes Mother   . Hypertension Mother   . Glaucoma Mother   . Cancer Mother     bone cancer  . Alcohol abuse Father   . Emphysema Father   . Other Father     CHF  . Cancer Sister     ? utereine ca    Outpatient Encounter Prescriptions as of 09/11/2013  Medication Sig  . acetaminophen (TYLENOL) 650 MG CR tablet Take 650 mg by mouth every 8 (eight) hours as needed.    . AMBULATORY NON FORMULARY MEDICATION Medication Name: CPAP with facemask with supplies and humidifier.  Dx: OSA (Test done at [facility]) [facility] [phone], Fax [phone]  . aspirin 325 MG tablet Take 325 mg by mouth daily.  . Canagliflozin 100 MG TABS Take 1 tablet (100 mg total) by mouth daily.  . cetirizine (ZYRTEC) 10 MG tablet Take 10 mg by mouth daily.    . clopidogrel (PLAVIX)  75 MG tablet TAKE 1 TABLET DAILY  . Ferrous Sulfate Dried (FEOSOL) 200 (65 FE) MG TABS Take 1 tablet by mouth daily.    Marland Kitchen glyBURIDE micronized (GLYNASE) 6 MG tablet TAKE 1 TABLET TWICE A DAY WITH MEALS  . indapamide (LOZOL) 2.5 MG tablet TAKE 1 TABLET DAILY  . insulin glargine (LANTUS) 100 UNIT/ML injection INJECT 35 UNITS INTO THE SKIN TWICE A DAY  . Insulin Pen Needle (BD ULTRA-FINE PEN NEEDLES) 29G X 12.7MM MISC by Does not apply route as directed.   . Insulin Pen Needle (NOVOFINE) 30G X 8 MM MISC Inject 10 each into the skin as needed.  Marland Kitchen lisinopril-hydrochlorothiazide (PRINZIDE,ZESTORETIC) 20-12.5 MG per tablet TAKE 2 TABLETS BY MOUTH DAILY.  . metFORMIN (GLUCOPHAGE) 1000 MG tablet TAKE 1 TABLET TWICE A DAY WITH MEALS  . Omega-3 Fatty Acids (FISH OIL) 1200 MG CAPS Take 1 capsule by mouth daily.  . ranitidine (ZANTAC) 150 MG tablet TAKE 1 TABLET TWICE A DAY  . [DISCONTINUED] Canagliflozin 100 MG TABS Take 100 mg by mouth daily.  . fluconazole (DIFLUCAN)  150 MG tablet Take 1 tablet (150 mg total) by mouth once.  . [DISCONTINUED] Lorcaserin HCl (BELVIQ) 10 MG TABS Take 10 mg by mouth every morning.  . [DISCONTINUED] meclizine (ANTIVERT) 25 MG tablet Take 25 mg by mouth 3 (three) times daily as needed.  . [DISCONTINUED] meclizine (ANTIVERT) 25 MG tablet TAKE 1 TABLET THREE TIMES A DAY AS NEEDED FOR DIZZINESS OR NAUSEA  . [DISCONTINUED] omeprazole (PRILOSEC) 40 MG capsule Take 1 capsule (40 mg total) by mouth daily.  . [DISCONTINUED] omeprazole (PRILOSEC) 40 MG capsule TAKE 1 CAPSULE (40 MG TOTAL) BY MOUTH DAILY. DIAGNOSIS EROSIVE GASTRITIS.       Objective:   Physical Exam  Constitutional: She is oriented to person, place, and time. She appears well-developed and well-nourished.  HENT:  Head: Normocephalic and atraumatic.  Cardiovascular: Normal rate, regular rhythm and normal heart sounds.   Pulmonary/Chest: Effort normal and breath sounds normal.  Neurological: She is alert and oriented to person, place, and time.  Skin: Skin is warm and dry.  Psychiatric: She has a normal mood and affect. Her behavior is normal.          Assessment & Plan:  DM- uncontrolled. Her to followup on the medication change. F/U in 6 weeks. Tolerating invokana well. Discussed consider increasing 300mg .  she would like to continue with the 100 mg until she sees the final affect on her next A1c. This definitely reasonable. Exam performed today. Due for fasting lipid panel. Lab slip provided today. Continue Lantus.  HTN - WEll controlled. Continue current regimen. Also congratulated her on 3 pound weight loss which is fantastic.  Yeast vaginitis-she gets recurrent infections even before she started the Invokana. We need to keep an eye on this. Did go ahead and send over some Diflucan to her local pharmacy.   Declines shingles vaccine.

## 2013-09-17 ENCOUNTER — Telehealth: Payer: Self-pay | Admitting: *Deleted

## 2013-09-17 MED ORDER — SITAGLIPTIN PHOSPHATE 100 MG PO TABS: 100.0000 mg | ORAL_TABLET | Freq: Every day | ORAL | Status: DC

## 2013-09-17 NOTE — Telephone Encounter (Signed)
Per patient insurance before they will cover the Invokana patient has to have tried Januvia, Janumet or Janumet XR first.  Per Dr. Madilyn Fireman can try the Januvia 100mg  take once a day to try and stop the [place].  Pt verbalized uinderstanding and put #28 samples up front for pt to pick up and try. Clemetine Marker, LPN

## 2013-09-24 LAB — BASIC METABOLIC PANEL WITH GFR
BUN: 18 mg/dL (ref 6–23)
CHLORIDE: 99 meq/L (ref 96–112)
CO2: 31 meq/L (ref 19–32)
Calcium: 9.4 mg/dL (ref 8.4–10.5)
Creat: 0.74 mg/dL (ref 0.50–1.10)
GFR, Est African American: >89 mL/min
GFR, Est Non African American: 87 mL/min
Glucose, Bld: 190 mg/dL — ABNORMAL HIGH (ref 70–99)
Potassium: 4.5 mEq/L (ref 3.5–5.3)
Sodium: 140 mEq/L (ref 135–145)

## 2013-09-28 ENCOUNTER — Telehealth: Payer: Self-pay | Admitting: *Deleted

## 2013-09-28 ENCOUNTER — Other Ambulatory Visit: Payer: Self-pay | Admitting: *Deleted

## 2013-09-28 MED ORDER — CANAGLIFLOZIN 100 MG PO TABS: 100.0000 mg | ORAL_TABLET | Freq: Every day | ORAL | Status: DC

## 2013-09-28 NOTE — Telephone Encounter (Signed)
Invokana approved from 08/29/13 with no end date. Case # [id number].  Oscar La, LPN

## 2013-10-03 ENCOUNTER — Other Ambulatory Visit: Payer: Self-pay | Admitting: Physician Assistant

## 2013-10-06 ENCOUNTER — Telehealth: Payer: Self-pay | Admitting: *Deleted

## 2013-10-06 NOTE — Telephone Encounter (Signed)
Called pt and lvm informing her that Anastasio Auerbach has been approved .Natalie Wright

## 2013-10-10 ENCOUNTER — Other Ambulatory Visit: Payer: Self-pay | Admitting: Physician Assistant

## 2013-11-09 ENCOUNTER — Ambulatory Visit: Admitting: Family Medicine

## 2013-11-20 ENCOUNTER — Ambulatory Visit: Admitting: Family Medicine

## 2013-11-27 ENCOUNTER — Encounter: Payer: Self-pay | Admitting: Family Medicine

## 2013-11-30 ENCOUNTER — Encounter: Payer: Self-pay | Admitting: Family Medicine

## 2013-11-30 ENCOUNTER — Ambulatory Visit (INDEPENDENT_AMBULATORY_CARE_PROVIDER_SITE_OTHER): Admitting: Family Medicine

## 2013-11-30 VITALS — BP 127/60 | HR 75 | Wt 379.0 lb

## 2013-11-30 DIAGNOSIS — E1165 Type 2 diabetes mellitus with hyperglycemia: Principal | ICD-10-CM

## 2013-11-30 DIAGNOSIS — IMO0001 Reserved for inherently not codable concepts without codable children: Secondary | ICD-10-CM

## 2013-11-30 DIAGNOSIS — I10 Essential (primary) hypertension: Secondary | ICD-10-CM

## 2013-11-30 LAB — POCT GLYCOSYLATED HEMOGLOBIN (HGB A1C): Hemoglobin A1C: 8.4

## 2013-11-30 MED ORDER — INSULIN GLARGINE 100 UNIT/ML ~~LOC~~ SOLN: SUBCUTANEOUS | Status: DC

## 2013-11-30 MED ORDER — CLOPIDOGREL BISULFATE 75 MG PO TABS: ORAL_TABLET | ORAL | Status: DC

## 2013-11-30 MED ORDER — INDAPAMIDE 2.5 MG PO TABS: ORAL_TABLET | ORAL | Status: DC

## 2013-11-30 MED ORDER — LISINOPRIL-HYDROCHLOROTHIAZIDE 20-12.5 MG PO TABS: ORAL_TABLET | ORAL | Status: DC

## 2013-11-30 MED ORDER — METFORMIN HCL 1000 MG PO TABS: ORAL_TABLET | ORAL | Status: DC

## 2013-11-30 MED ORDER — CANAGLIFLOZIN 300 MG PO TABS: 300.0000 mg | ORAL_TABLET | Freq: Every day | ORAL | Status: DC

## 2013-11-30 MED ORDER — GLYBURIDE MICRONIZED 6 MG PO TABS: ORAL_TABLET | ORAL | Status: DC

## 2013-11-30 NOTE — Progress Notes (Signed)
   Subjective:    Patient ID: Natalie Wright, female    DOB: 1951/06/17, 63 y.o.   MRN: [id number]  HPI Diabetes - no hypoglycemic events. No wounds or sores that are not healing well. No increased thirst or urination. Checking glucose at home. Taking medications as prescribed without any side effects.  Hypertension- Pt denies chest pain, SOB, dizziness, or heart palpitations.  Taking meds as directed w/o problems.  Denies medication side effects.   Review of Systems     Objective:   Physical Exam  Constitutional: She is oriented to person, place, and time. She appears well-developed and well-nourished.  HENT:  Head: Normocephalic and atraumatic.  Cardiovascular: Normal rate, regular rhythm and normal heart sounds.   Pulmonary/Chest: Effort normal and breath sounds normal.  Neurological: She is alert and oriented to person, place, and time.  Skin: Skin is warm and dry.  Psychiatric: She has a normal mood and affect. Her behavior is normal.          Assessment & Plan:  DM- Uncontrolled but I am impressed w/ her improvement. Down from 9.9. Work on diet and exercise over the summer. Recommend trial of increasing the Invokana to 300 mg. Samples given.  If tolerates well then call.  Adjusted Lantus to 40 units. Has eye exam schedule on Firday. Was unable to given  A urine sample for urine microalbumin.  Lab Results  Component Value Date   HGBA1C 8.4 11/30/2013   HTN - Well controlled.

## 2013-12-02 ENCOUNTER — Other Ambulatory Visit: Payer: Self-pay | Admitting: *Deleted

## 2013-12-02 MED ORDER — INSULIN GLARGINE 100 UNIT/ML ~~LOC~~ SOLN: SUBCUTANEOUS | Status: DC

## 2013-12-14 ENCOUNTER — Other Ambulatory Visit: Payer: Self-pay | Admitting: Family Medicine

## 2013-12-14 NOTE — Telephone Encounter (Signed)
F/u specifically for GERD needed before future refills

## 2013-12-15 ENCOUNTER — Other Ambulatory Visit: Payer: Self-pay | Admitting: *Deleted

## 2013-12-15 MED ORDER — MECLIZINE HCL 25 MG PO TABS: ORAL_TABLET | ORAL | Status: DC

## 2014-02-01 ENCOUNTER — Other Ambulatory Visit: Payer: Self-pay | Admitting: Family Medicine

## 2014-03-02 ENCOUNTER — Other Ambulatory Visit: Payer: Self-pay | Admitting: Family Medicine

## 2014-03-05 ENCOUNTER — Ambulatory Visit: Admitting: Family Medicine

## 2014-04-02 ENCOUNTER — Ambulatory Visit: Admitting: Family Medicine

## 2014-04-30 ENCOUNTER — Ambulatory Visit: Admitting: Family Medicine

## 2014-05-03 ENCOUNTER — Encounter: Payer: Self-pay | Admitting: Family Medicine

## 2014-05-03 ENCOUNTER — Ambulatory Visit (INDEPENDENT_AMBULATORY_CARE_PROVIDER_SITE_OTHER): Admitting: Family Medicine

## 2014-05-03 ENCOUNTER — Ambulatory Visit (INDEPENDENT_AMBULATORY_CARE_PROVIDER_SITE_OTHER)

## 2014-05-03 VITALS — BP 98/56 | HR 80 | Wt 375.0 lb

## 2014-05-03 DIAGNOSIS — Z111 Encounter for screening for respiratory tuberculosis: Secondary | ICD-10-CM

## 2014-05-03 DIAGNOSIS — R197 Diarrhea, unspecified: Secondary | ICD-10-CM

## 2014-05-03 DIAGNOSIS — Z23 Encounter for immunization: Secondary | ICD-10-CM

## 2014-05-03 DIAGNOSIS — E1165 Type 2 diabetes mellitus with hyperglycemia: Secondary | ICD-10-CM

## 2014-05-03 DIAGNOSIS — I10 Essential (primary) hypertension: Secondary | ICD-10-CM

## 2014-05-03 DIAGNOSIS — IMO0001 Reserved for inherently not codable concepts without codable children: Secondary | ICD-10-CM

## 2014-05-03 MED ORDER — CANAGLIFLOZIN 300 MG PO TABS: 300.0000 mg | ORAL_TABLET | Freq: Every day | ORAL | Status: DC

## 2014-05-03 MED ORDER — FLUCONAZOLE 150 MG PO TABS: 150.0000 mg | ORAL_TABLET | Freq: Once | ORAL | Status: DC

## 2014-05-03 NOTE — Assessment & Plan Note (Signed)
Unfortunately her A1c machine was not working today. She is going to follow-up in 2 days to have her PPD checked. We will check her A1c at that time. It sounds like at least over the last 2 weeks her blood sugars have looked fantastic because of the increased dose of the Invokana. I wish she had taken it over the last 3 months. Attempts not clear to me why she waited to started about 2 weeks ago. He did go ahead and send of her prescription to her pharmacy. If her A1c is trending down then we will continue with her current regimen and see her back in 3 months. She has had a couple of low sugars in the morning. She will need to keep an eye on this and call me for need to adjust her medications. She is overdue from urine microalbumin. We were unable to collect at the last office visit and today we were unable to collect it as well. Encouraged her to please try to save her urine for her visit on Wednesday.

## 2014-05-03 NOTE — Assessment & Plan Note (Signed)
Continue to work on diet and exercise as much as possible. I'm hoping that we can get her A1c down and maybe even get her off the glyburide ride which may help with her attempts at weight loss.

## 2014-05-03 NOTE — Assessment & Plan Note (Signed)
Well controlled 

## 2014-05-03 NOTE — Progress Notes (Signed)
   Subjective:    Patient ID: Natalie Wright, female    DOB: 04/23/1951, 63 y.o.   MRN: [id number]  HPI Diabetes - no hypoglycemic events. No wounds or sores that are not healing well. No increased thirst or urination. Checking glucose at home. Taking medications as prescribed without any side effects. Just started her invokana 300mg  about 2 weeks ago and says she sugars have really looked great. Says sugars have been running around 100. Wants to try to get off her ranitidine.    She thinks she may have IBS.  At first thought she may be lactose intolerant.  Says dairy tends to cause her diarrhea.  Bowels have made a lot of noise. She has also had pain that tells her she needs to go to the bathroom. Says once she has BM she feels better. Bowels are very lose.  No recent exposure to antibiotics. Hypertension- Pt denies chest pain, SOB, dizziness, or heart palpitations.  Taking meds as directed w/o problems.  Denies medication side effects.      Review of Systems     Objective:   Physical Exam  Constitutional: She is oriented to person, place, and time. She appears well-developed and well-nourished.  HENT:  Head: Normocephalic and atraumatic.  Cardiovascular: Normal rate, regular rhythm and normal heart sounds.   Pulmonary/Chest: Effort normal and breath sounds normal.  Neurological: She is alert and oriented to person, place, and time.  Skin: Skin is warm and dry.  Psychiatric: She has a normal mood and affect. Her behavior is normal.          Assessment & Plan:  Flu shot given.   [place] consistent with irritable bowel syndrome based on her description today. I discussed with her that I would like to check some blood work including her thyroid and a CBC as well as stool culture and C. Difficile just to rule out other potential causes. She says backing off on dairy products has helped some. Also consider gluten intolerance if symptoms persist. Continue to avoid dairy products.  May need to start reading labels on products, as they can often contain things like milk powder.

## 2014-05-05 ENCOUNTER — Ambulatory Visit

## 2014-05-06 ENCOUNTER — Ambulatory Visit (INDEPENDENT_AMBULATORY_CARE_PROVIDER_SITE_OTHER): Admitting: Family Medicine

## 2014-05-06 VITALS — BP 108/64 | Wt 377.0 lb

## 2014-05-06 DIAGNOSIS — E1165 Type 2 diabetes mellitus with hyperglycemia: Secondary | ICD-10-CM

## 2014-05-06 DIAGNOSIS — Z111 Encounter for screening for respiratory tuberculosis: Secondary | ICD-10-CM

## 2014-05-06 DIAGNOSIS — IMO0002 Reserved for concepts with insufficient information to code with codable children: Secondary | ICD-10-CM

## 2014-05-06 LAB — POCT GLYCOSYLATED HEMOGLOBIN (HGB A1C): Hemoglobin A1C: 8.3

## 2014-05-06 LAB — TB SKIN TEST
Induration: 0 mm
TB Skin Test: NEGATIVE

## 2014-05-06 LAB — POCT UA - MICROALBUMIN
Creatinine, POC: 50 mg/dL
Microalbumin Ur, POC: 10 mg/L

## 2014-05-06 NOTE — Progress Notes (Signed)
   Subjective:    Patient ID: Natalie Wright, female    DOB: 1950-12-11, 63 y.o.   MRN: [id number]  HPI  Natalie Wright is here to follow up on diabetic lab test such as HgBA1C and Microalbumin. Also for a PPD read.   Review of Systems     Objective:   Physical Exam        Assessment & Plan:  DM -  Test ordered - HgBA1C 8.3 % Mirco-albumin was normal - Patient advised to continue taking the increased dose of Invokana.   PPD - negative

## 2014-05-07 LAB — CBC WITH DIFFERENTIAL/PLATELET
BASOS PCT: 0 % (ref 0–1)
Basophils Absolute: 0 10*3/uL (ref 0.0–0.1)
EOS ABS: 0.2 10*3/uL (ref 0.0–0.7)
Eosinophils Relative: 2 % (ref 0–5)
HCT: 42.9 % (ref 36.0–46.0)
Hemoglobin: 13.8 g/dL (ref 12.0–15.0)
LYMPHS ABS: 1.9 10*3/uL (ref 0.7–4.0)
Lymphocytes Relative: 23 % (ref 12–46)
MCH: 27.9 pg (ref 26.0–34.0)
MCHC: 32.2 g/dL (ref 30.0–36.0)
MCV: 86.7 fL (ref 78.0–100.0)
Monocytes Absolute: 0.5 10*3/uL (ref 0.1–1.0)
Monocytes Relative: 6 % (ref 3–12)
NEUTROS PCT: 69 % (ref 43–77)
Neutro Abs: 5.6 10*3/uL (ref 1.7–7.7)
Platelets: 370 10*3/uL (ref 150–400)
RBC: 4.95 MIL/uL (ref 3.87–5.11)
RDW: 13.7 % (ref 11.5–15.5)
WBC: 8.1 10*3/uL (ref 4.0–10.5)

## 2014-05-07 LAB — TSH: TSH: 1.729 u[IU]/mL (ref 0.350–4.500)

## 2014-05-07 LAB — COMPLETE METABOLIC PANEL WITH GFR
ALK PHOS: 91 U/L (ref 39–117)
ALT: 13 U/L (ref 0–35)
AST: 16 U/L (ref 0–37)
Albumin: 4.1 g/dL (ref 3.5–5.2)
BILIRUBIN TOTAL: 0.4 mg/dL (ref 0.2–1.2)
BUN: 17 mg/dL (ref 6–23)
CO2: 29 meq/L (ref 19–32)
Calcium: 9.7 mg/dL (ref 8.4–10.5)
Chloride: 103 mEq/L (ref 96–112)
Creat: 0.9 mg/dL (ref 0.50–1.10)
GFR, Est African American: 79 mL/min
GFR, Est Non African American: 68 mL/min
Glucose, Bld: 238 mg/dL — ABNORMAL HIGH (ref 70–99)
Potassium: 4.8 mEq/L (ref 3.5–5.3)
Sodium: 138 mEq/L (ref 135–145)
TOTAL PROTEIN: 7 g/dL (ref 6.0–8.3)

## 2014-05-07 NOTE — Progress Notes (Signed)
Quick Note:  All labs are normal. ______ 

## 2014-05-11 ENCOUNTER — Other Ambulatory Visit: Payer: Self-pay | Admitting: Family Medicine

## 2014-05-12 ENCOUNTER — Other Ambulatory Visit: Payer: Self-pay | Admitting: Family Medicine

## 2014-05-20 ENCOUNTER — Other Ambulatory Visit: Payer: Self-pay | Admitting: *Deleted

## 2014-05-20 MED ORDER — CANAGLIFLOZIN 300 MG PO TABS: 300.0000 mg | ORAL_TABLET | Freq: Every day | ORAL | Status: DC

## 2014-06-02 ENCOUNTER — Other Ambulatory Visit: Payer: Self-pay | Admitting: Family Medicine

## 2014-06-03 LAB — C. DIFFICILE GDH AND TOXIN A/B
C. difficile GDH: NOT DETECTED
C. difficile Toxin A/B: NOT DETECTED

## 2014-06-06 LAB — STOOL CULTURE

## 2014-06-07 NOTE — Progress Notes (Signed)
Quick Note:  Stool culture is negative . See how she is doing  ______

## 2014-06-14 ENCOUNTER — Encounter: Payer: Self-pay | Admitting: Cardiovascular Disease

## 2014-07-23 ENCOUNTER — Other Ambulatory Visit: Payer: Self-pay | Admitting: Family Medicine

## 2014-08-09 ENCOUNTER — Ambulatory Visit: Admitting: Family Medicine

## 2014-08-10 ENCOUNTER — Other Ambulatory Visit: Payer: Self-pay | Admitting: Family Medicine

## 2014-08-11 NOTE — Telephone Encounter (Signed)
Due for f/u

## 2014-08-20 ENCOUNTER — Ambulatory Visit (INDEPENDENT_AMBULATORY_CARE_PROVIDER_SITE_OTHER): Admitting: Family Medicine

## 2014-08-20 ENCOUNTER — Encounter: Payer: Self-pay | Admitting: Family Medicine

## 2014-08-20 VITALS — BP 131/73 | HR 64 | Wt 380.0 lb

## 2014-08-20 DIAGNOSIS — E1165 Type 2 diabetes mellitus with hyperglycemia: Secondary | ICD-10-CM

## 2014-08-20 DIAGNOSIS — Z6841 Body Mass Index (BMI) 40.0 and over, adult: Secondary | ICD-10-CM

## 2014-08-20 DIAGNOSIS — I10 Essential (primary) hypertension: Secondary | ICD-10-CM

## 2014-08-20 DIAGNOSIS — IMO0001 Reserved for inherently not codable concepts without codable children: Secondary | ICD-10-CM

## 2014-08-20 LAB — POCT GLYCOSYLATED HEMOGLOBIN (HGB A1C): HEMOGLOBIN A1C: 9.3

## 2014-08-20 NOTE — Progress Notes (Signed)
   Subjective:    Patient ID: Natalie Wright, female    DOB: [birth year], 65 y.o.   MRN: [id number]  HPI Starts Optifast program next month. Has been to initial meeting.  She plans on losing weight.  She has never done meal replacement.  Using Glucerna for breakfast instead of gong through drivethru. Started this about 3 weeks.  Started some exercise.  Diabetes - no hypoglycemic events. No wounds or sores that are not healing well. No increased thirst or urination. Checking glucose at home. Taking medications as prescribed without any side effects.she goes up and down on her lantus.  She has appt next month with Dr. Lester Kinsman for her vision.   Hypertension- Pt denies chest pain, SOB, dizziness, or heart palpitations.  Taking meds as directed w/o problems.  Denies medication side effects.      Review of Systems     Objective:   Physical Exam  Constitutional: She is oriented to person, place, and time. She appears well-developed and well-nourished.  HENT:  Head: Normocephalic and atraumatic.  Cardiovascular: Normal rate, regular rhythm and normal heart sounds.   Pulmonary/Chest: Effort normal and breath sounds normal.  Neurological: She is alert and oriented to person, place, and time.  Skin: Skin is warm and dry.  Psychiatric: She has a normal mood and affect. Her behavior is normal.          Assessment & Plan:  DM- had discussion about making sure using 40 units of Lantus consistantly. I think her changing the dosing back and forth may be making it more difficult to get a true read as first consistency with her blood glucose levels. Her A1c is elevated from 8.310 9.3 today. Uncontrolled.  She is going to work on weight loss aggressive.  I'm not going to increase the medication above 40 units and she is going to aggressively work on weight loss. Optifast is typically a pretty aggressive weight loss regimen.  BMI 71 /morbid obesity-I'm very excited that she's starting the Optifast  program. I have she will be extremely successful. This will make a such a difference in her diabetes control as well as her blood pressure and her joint pain.   HTN - well controlled. Continue current regimen per follow up in 6 months.

## 2014-08-30 LAB — HM DIABETES EYE EXAM

## 2014-09-07 LAB — INSULIN, RANDOM: INSULIN: 14.7

## 2014-09-07 LAB — C REACTIVE PROTEIN, FLUID: C Reactive Protein, Fluid: 18.4

## 2014-09-07 LAB — ALBUMIN: Albumin: 4.1

## 2014-09-17 LAB — BASIC METABOLIC PANEL
Creatinine: 0.7 mg/dL (ref 0.5–1.1)
GLUCOSE: 195 mg/dL
Potassium: 4.2 mmol/L (ref 3.4–5.3)
SODIUM: 139 mmol/L (ref 137–147)

## 2014-09-17 LAB — HEPATIC FUNCTION PANEL
ALT: 14 U/L (ref 7–35)
AST: 14 U/L (ref 13–35)
Alkaline Phosphatase: 87 U/L (ref 25–125)
BILIRUBIN, TOTAL: 0.4 mg/dL

## 2014-09-17 LAB — LIPID PANEL
CHOLESTEROL: 178 mg/dL (ref 0–200)
HDL: 46 mg/dL (ref 35–70)
LDL CALC: 118 mg/dL
TRIGLYCERIDES: 105 mg/dL (ref 40–160)

## 2014-09-17 LAB — PROTEIN, TOTAL: TOTAL PROTEIN: 6.9 g/dL

## 2014-09-17 LAB — CBC AND DIFFERENTIAL
HEMOGLOBIN: 13.7 g/dL (ref 12.0–16.0)
Platelets: 299 10*3/uL (ref 150–399)
WBC: 7.8 10^3/mL

## 2014-09-17 LAB — TSH: TSH: 2.18 u[IU]/mL (ref 0.41–5.90)

## 2014-09-17 LAB — HEMOGLOBIN A1C: HEMOGLOBIN A1C: 9.4 % — AB (ref 4.0–6.0)

## 2014-09-26 ENCOUNTER — Other Ambulatory Visit: Payer: Self-pay | Admitting: Family Medicine

## 2014-09-28 NOTE — Telephone Encounter (Signed)
F/u in august

## 2014-10-21 ENCOUNTER — Other Ambulatory Visit: Payer: Self-pay | Admitting: Family Medicine

## 2014-11-08 ENCOUNTER — Other Ambulatory Visit: Payer: Self-pay | Admitting: Family Medicine

## 2014-11-19 ENCOUNTER — Encounter: Payer: Self-pay | Admitting: Family Medicine

## 2014-11-19 ENCOUNTER — Ambulatory Visit (INDEPENDENT_AMBULATORY_CARE_PROVIDER_SITE_OTHER): Admitting: Family Medicine

## 2014-11-19 VITALS — BP 118/61 | HR 55 | Ht 61.0 in | Wt 342.0 lb

## 2014-11-19 DIAGNOSIS — E1165 Type 2 diabetes mellitus with hyperglycemia: Secondary | ICD-10-CM

## 2014-11-19 DIAGNOSIS — I10 Essential (primary) hypertension: Secondary | ICD-10-CM | POA: Diagnosis not present

## 2014-11-19 DIAGNOSIS — Z6841 Body Mass Index (BMI) 40.0 and over, adult: Secondary | ICD-10-CM | POA: Diagnosis not present

## 2014-11-19 DIAGNOSIS — Z23 Encounter for immunization: Secondary | ICD-10-CM | POA: Diagnosis not present

## 2014-11-19 DIAGNOSIS — IMO0001 Reserved for inherently not codable concepts without codable children: Secondary | ICD-10-CM

## 2014-11-19 DIAGNOSIS — G4730 Sleep apnea, unspecified: Secondary | ICD-10-CM

## 2014-11-19 LAB — POCT GLYCOSYLATED HEMOGLOBIN (HGB A1C): HEMOGLOBIN A1C: 7.3

## 2014-11-19 MED ORDER — LISINOPRIL-HYDROCHLOROTHIAZIDE 20-25 MG PO TABS: 1.0000 | ORAL_TABLET | Freq: Every day | ORAL | Status: DC

## 2014-11-19 NOTE — Progress Notes (Signed)
   Subjective:    Patient ID: Natalie Wright, female    DOB: February 09, 1951, 64 y.o.   MRN: [id number]  HPI  Diabetes - no hypoglycemic events. No wounds or sores that are not healing well. No increased thirst or urination. Checking glucose at home. Taking medications as prescribed without any side effects. Because of her recent weight loss they have adjusted her medications at the Optifast program. They have decreased her Lantus to 30 units and decreased her metformin to once a day. They have stopped her glipizide. Still on invokana. She did have some blood work including cholesterol done through the program. We will get a copy of that through care [facility].  BMI of 64-she's actually lost 38 pounds on Optifast.  She is feeling better and sleeping better.   Hypertension- Pt denies chest pain, SOB, dizziness, or heart palpitations.  Taking meds as directed w/o problems.  Denies medication side effects.    Sleep apnea-she really has not been using her CPAP. She has tried 4 different masks and is really struggled with this. She knows she needs to be using it.   Review of Systems     Objective:   Physical Exam  Constitutional: She is oriented to person, place, and time. She appears well-developed and well-nourished.  HENT:  Head: Normocephalic and atraumatic.  Cardiovascular: Normal rate, regular rhythm and normal heart sounds.   Pulmonary/Chest: Effort normal and breath sounds normal.  Neurological: She is alert and oriented to person, place, and time.  Skin: Skin is warm and dry.  Psychiatric: She has a normal mood and affect. Her behavior is normal.          Assessment & Plan:  Diabetes-her A1c today is 7.3 which is down from 9.3. This is fantastic progress over the last 3 months.  HTN - well controlled.  F/U in 3 months. Decrease lisinopril to 20 mg daily. She would like to keep the diuretic component so we will change her to lisinopril HCT 20/25.   BMI 61 today- has  lost 68 lbs.  continue Optifast program. She's doing fantastic. Cholesterol looks great overall.  OSA - she can call us back with the name of the company that she is getting her supplies 3. We can certainly call them and see if they would be willing to try another mask or maybe even nasal pillows. I'm not sure if she would be a candidate or not. In addition we can set her to auto titrate for week and then do a download to see if we might need to adjust her pressure since she has lost a significant amount weight.

## 2014-11-23 ENCOUNTER — Encounter: Payer: Self-pay | Admitting: Family Medicine

## 2014-12-27 ENCOUNTER — Other Ambulatory Visit: Payer: Self-pay | Admitting: Family Medicine

## 2015-02-15 LAB — BASIC METABOLIC PANEL
BUN: 31 mg/dL — AB (ref 4–21)
Creatinine: 0.8 mg/dL (ref 0.5–1.1)
Glucose: 125 mg/dL
Potassium: 4.1 mmol/L (ref 3.4–5.3)
SODIUM: 138 mmol/L (ref 137–147)

## 2015-02-18 ENCOUNTER — Ambulatory Visit (INDEPENDENT_AMBULATORY_CARE_PROVIDER_SITE_OTHER): Admitting: Family Medicine

## 2015-02-18 ENCOUNTER — Encounter: Payer: Self-pay | Admitting: Family Medicine

## 2015-02-18 VITALS — BP 126/64 | HR 65 | Ht 61.0 in | Wt 338.0 lb

## 2015-02-18 DIAGNOSIS — I10 Essential (primary) hypertension: Secondary | ICD-10-CM

## 2015-02-18 DIAGNOSIS — E1165 Type 2 diabetes mellitus with hyperglycemia: Secondary | ICD-10-CM

## 2015-02-18 DIAGNOSIS — G4730 Sleep apnea, unspecified: Secondary | ICD-10-CM

## 2015-02-18 DIAGNOSIS — IMO0001 Reserved for inherently not codable concepts without codable children: Secondary | ICD-10-CM

## 2015-02-18 LAB — POCT GLYCOSYLATED HEMOGLOBIN (HGB A1C): Hemoglobin A1C: 7.5

## 2015-02-18 MED ORDER — LIRAGLUTIDE 18 MG/3ML ~~LOC~~ SOPN: 0.6000 mg | PEN_INJECTOR | Freq: Every day | SUBCUTANEOUS | Status: DC

## 2015-02-18 MED ORDER — METFORMIN HCL 500 MG PO TABS: 500.0000 mg | ORAL_TABLET | Freq: Two times a day (BID) | ORAL | Status: DC

## 2015-02-18 NOTE — Progress Notes (Signed)
   Subjective:    Patient ID: Natalie Wright, female    DOB: [birth year], 64 y.o.   MRN: [id number]  HPI Diabetes - no hypoglycemic events. No wounds or sores that are not healing well. No increased thirst or urination. Checking glucose at home. Taking medications as prescribed without any side effects.  Morbid obesity-Patient is currently being followed at [facility] and is enrolled in the Optifast program for weight loss. She says they recently discontinued her lisinopril HCTZ and just put her on lisinopril by itself. She also looked little dehydrated on her recent BMP. They also decreased her metformin 1 from 1000 mg to 5000 mg twice a day. They also discontinued her glyburide and titrated her Lantus down to 20 units in the morning and 15 at night which was down from 35 units twice a day.  OSA - says she had stopped using the CPAP and the machine needs to be serviced through [employer].  She say sit is making a weird noise and doen't seem to be working well.   Hypertension- Pt denies chest pain, SOB, dizziness, or heart palpitations.  Taking meds as directed w/o problems.  Denies medication side effects.      Review of Systems     Objective:   Physical Exam  Constitutional: She is oriented to person, place, and time. She appears well-developed and well-nourished.  HENT:  Head: Normocephalic and atraumatic.  Cardiovascular: Normal rate, regular rhythm and normal heart sounds.   Pulmonary/Chest: Effort normal and breath sounds normal.  Neurological: She is alert and oriented to person, place, and time.  Skin: Skin is warm and dry.  Psychiatric: She has a normal mood and affect. Her behavior is normal.          Assessment & Plan:  DM - uncontrolled. Will start Victoza. Lab work is up-to-date.  Obesity/BMI 63 - discussed options. I think getting back on Victoza would really help curb her appetite. We'll see her back in 3 months. She removed taking them before. Caught having any side  effects.  HTN- currently well controlled on the lisinopril 20 mg. Now off of the Hutch chlorothiazide. If blood pressure continues to go low can decrease to half a tab if needed.  Obstructive sleep apnea-will call [employer] to see if they can service her machine. Since his more than 64 years old and may even be able to send her a new one. After that I want to get her set on auto Pap for one week and then do a download to see if her pressure needs to be readjusted since she has lost a lot of weight.

## 2015-02-22 ENCOUNTER — Telehealth: Payer: Self-pay | Admitting: *Deleted

## 2015-02-22 MED ORDER — AMBULATORY NON FORMULARY MEDICATION: Status: DC

## 2015-02-22 NOTE — Telephone Encounter (Signed)
Spoke w/Alice about getting pt's cpap machine serviced. She stated that since it had been so long she would need an order to state that machine will need to be serviced, pt to placed on auto pap and downloaded. This can be faxed to [phone] order placed.Audelia Hives [place]

## 2015-02-28 ENCOUNTER — Other Ambulatory Visit: Payer: Self-pay

## 2015-02-28 MED ORDER — LIRAGLUTIDE 18 MG/3ML ~~LOC~~ SOPN: 0.6000 mg | PEN_INJECTOR | Freq: Every day | SUBCUTANEOUS | Status: DC

## 2015-02-28 MED ORDER — LISINOPRIL 20 MG PO TABS: 20.0000 mg | ORAL_TABLET | Freq: Every day | ORAL | Status: DC

## 2015-03-09 ENCOUNTER — Telehealth: Payer: Self-pay

## 2015-03-09 NOTE — Telephone Encounter (Signed)
Left message for a return call

## 2015-03-09 NOTE — Telephone Encounter (Signed)
PA for Victoza was denied. Patient has to have tried and failed either Bydureon or Tanzeum. Please advise.   Appeal's fax # [phone] Case number [id number]

## 2015-03-09 NOTE — Telephone Encounter (Signed)
Call patient and let her know. I think either of the other 2 choices would be fine if she's willing to try it. Let me know what she would like to do.

## 2015-03-14 NOTE — Telephone Encounter (Signed)
Left a message for a return call.

## 2015-03-15 NOTE — Telephone Encounter (Signed)
Patient states Dr Berenice Primas has sent in Saxenda. She will call us back if this medication doesn't get approved.

## 2015-04-12 ENCOUNTER — Other Ambulatory Visit: Payer: Self-pay | Admitting: Family Medicine

## 2015-04-12 NOTE — Telephone Encounter (Signed)
Is it possible that this patient could be on saxenda, invokana,lantus and metformin?

## 2015-04-14 ENCOUNTER — Other Ambulatory Visit: Payer: Self-pay | Admitting: Family Medicine

## 2015-04-17 ENCOUNTER — Other Ambulatory Visit: Payer: Self-pay | Admitting: Family Medicine

## 2015-04-20 ENCOUNTER — Telehealth: Payer: Self-pay | Admitting: *Deleted

## 2015-04-20 MED ORDER — CANAGLIFLOZIN 300 MG PO TABS: 300.0000 mg | ORAL_TABLET | Freq: Every day | ORAL | Status: DC

## 2015-04-20 NOTE — Telephone Encounter (Signed)
Called pt and informed her that we have not gotten anything in regards to a PA for Invokana. I told her that this Rx was sent on 04/13/15. I told her that I would give her a card to use for the med and she can use this at the local pharmacy.Natalie Wright [place]

## 2015-04-21 ENCOUNTER — Telehealth: Payer: Self-pay | Admitting: Family Medicine

## 2015-04-21 MED ORDER — EMPAGLIFLOZIN 25 MG PO TABS: 25.0000 mg | ORAL_TABLET | Freq: Every day | ORAL | Status: DC

## 2015-04-21 NOTE — Telephone Encounter (Signed)
Called pt and lvm informing her of change.Natalie Wright [place]

## 2015-04-21 NOTE — Telephone Encounter (Signed)
Call pt: the invokana is not covered.  Will change to Jardiance. New rx sent to [facility].

## 2015-05-09 LAB — BASIC METABOLIC PANEL
BUN: 21 mg/dL (ref 4–21)
CREATININE: 0.8 mg/dL (ref 0.5–1.1)
Glucose: 73 mg/dL
POTASSIUM: 4 mmol/L (ref 3.4–5.3)
SODIUM: 139 mmol/L (ref 137–147)

## 2015-05-20 ENCOUNTER — Encounter: Payer: Self-pay | Admitting: Family Medicine

## 2015-05-20 ENCOUNTER — Ambulatory Visit (INDEPENDENT_AMBULATORY_CARE_PROVIDER_SITE_OTHER): Admitting: Family Medicine

## 2015-05-20 VITALS — BP 138/62 | HR 65 | Temp 98.0°F | Resp 18 | Wt 348.8 lb

## 2015-05-20 DIAGNOSIS — R0981 Nasal congestion: Secondary | ICD-10-CM

## 2015-05-20 DIAGNOSIS — E1165 Type 2 diabetes mellitus with hyperglycemia: Secondary | ICD-10-CM | POA: Diagnosis not present

## 2015-05-20 DIAGNOSIS — Z23 Encounter for immunization: Secondary | ICD-10-CM

## 2015-05-20 DIAGNOSIS — Z6841 Body Mass Index (BMI) 40.0 and over, adult: Secondary | ICD-10-CM

## 2015-05-20 DIAGNOSIS — G4730 Sleep apnea, unspecified: Secondary | ICD-10-CM

## 2015-05-20 DIAGNOSIS — K219 Gastro-esophageal reflux disease without esophagitis: Secondary | ICD-10-CM | POA: Diagnosis not present

## 2015-05-20 DIAGNOSIS — IMO0001 Reserved for inherently not codable concepts without codable children: Secondary | ICD-10-CM

## 2015-05-20 MED ORDER — DILTIAZEM HCL ER COATED BEADS 120 MG PO CP24: 240.0000 mg | ORAL_CAPSULE | Freq: Every day | ORAL | Status: DC

## 2015-05-20 NOTE — Progress Notes (Signed)
Subjective:    Patient ID: Natalie Wright, female    DOB: 11/03/1950, 64 y.o.   MRN: [id number]  HPI Diabetes - no hypoglycemic events. No wounds or sores that are not healing well. No increased thirst or urination. Checking glucose at home. Taking medications as prescribed without any side effects. She does report that they changed some of her medications at [facility]. They discontinued her Invokana. They also stopped her indapamide and put her on her Cartia XL 120 mg, 2 in the morning.  Last A!c was 7.4. She is doing well on the jardiance in place of the invoka.  She is seeing Dr. Berenice Primas for [facility].  She is hoping to get her weight loss surgery scheduled in December. She just has to meet with a psychologist.  Hypertension-they discontinued her indapamide and put her on Cartia. She's doing well medication without any side effects. They went up on her dose to 2 tabs of the 120 mg dose 2 days ago. She had she says she feels better on this. No chest pain or shortness of breath.  She had endoscopy on 05/16/15 and was dx with hiatal hernia.  Says woke up the next day with congestion with some blody nasal discharge. No ever, chills or sweats. She's also still having some more heartburn symptoms. She's noticing a little bit more belching at night after she eats. She's had a couple of episodes of brash. She is not currently taking any medication for reflux. She does still intake caffeine and the occasional soda.   Her can slid and she fell in the bathroom about a month ago and had to get on her knees to get up and she has had more knee pain since then.     Review of Systems     Objective:   Physical Exam  Constitutional: She is oriented to person, place, and time. She appears well-developed and well-nourished.  HENT:  Head: Normocephalic and atraumatic.  Right Ear: External ear normal.  Left Ear: External ear normal.  Nose: Nose normal.  Mouth/Throat: Oropharynx is clear  and moist.  TMs and canals are clear.   Eyes: Conjunctivae and EOM are normal. Pupils are equal, round, and reactive to light.  Neck: Neck supple. No thyromegaly present.  Cardiovascular: Normal rate, regular rhythm and normal heart sounds.   Pulmonary/Chest: Effort normal and breath sounds normal. She has no wheezes.  Lymphadenopathy:    She has no cervical adenopathy.  Neurological: She is alert and oriented to person, place, and time.  Skin: Skin is warm and dry.  Psychiatric: She has a normal mood and affect. Her behavior is normal.           Assessment & Plan:  DM - A1C down to 7.0. Well controlled.  Continue current regimen. Back she decreased her metformin at the weight management clinic. I still would like to get her A1c down under 7. Continue with current regimen.  Sinus congestion - likely AR vs viral.  She is feeling some better.  Call back sooner she feels like she suddenly getting worse.  Obstructive sleep apnea- she finally is getting a new CPAP machine.  Even though she is having weight loss surgery in Jan she wants to know if she should even work to get a new maching or not. I strongly encouraged her to go ahead and get machine. I can help her be elevated more healthy before surgery and encouraged her to [facility]  Center can be used postop. And then we can slowly wean the pressure as she is losing weight.  GERD - Recently dx with hiatal hernia.  Reviewed dietary measures.  Could consider taking a PPI for short period of time. She says she will start by working on diet.  Morbid obesity/BMI of 65-she's actually done fantastic and her BMI is down from 70. She is actually hoping to get her weight loss surgery scheduled sometime in December at the latest January. Hopefully should be able to come off of a lot of these medications at that time.  I did fill out handicap placard for her today.  Flu vaccine given today.

## 2015-05-23 ENCOUNTER — Encounter: Payer: Self-pay | Admitting: Emergency Medicine

## 2015-05-23 ENCOUNTER — Ambulatory Visit: Admitting: Family Medicine

## 2015-05-23 LAB — CHLORIDE
ANION GAP: 9
Anion gap: 7
CALCIUM: 10.1 mg/dL
CHLORIDE: 98 mmol/L
CO2: 31 mmol/L
Calcium: 9.7 mg/dL
Carbon Dioxide, Total: 30
Chloride: 102 mmol/L

## 2015-05-23 LAB — TRANSFERRIN
Ferritin: 53
IRON: 52
TIBC: 334
TRANSFERRIN: 262
UIBC: 282

## 2015-05-31 ENCOUNTER — Other Ambulatory Visit: Payer: Self-pay

## 2015-05-31 MED ORDER — LISINOPRIL 20 MG PO TABS: 20.0000 mg | ORAL_TABLET | Freq: Every day | ORAL | Status: DC

## 2015-06-09 ENCOUNTER — Telehealth: Payer: Self-pay | Admitting: *Deleted

## 2015-06-09 NOTE — Telephone Encounter (Signed)
Pt left vm stating that she had a deep cleaning at her dentist office Monday and since then her gums won't stop bleeding.  Dr. Vertell Limber has suggested a few things for her to try but none have worked.  This morning he suggested that she have some clotting labs since she is on plavix.

## 2015-06-10 NOTE — Telephone Encounter (Signed)
LMOM -  Okay to hold Plavix for 2-3 days until the bleeding stops. I don't know that she really needs any extra blood work it's probably the Plavix is causing the ongoing bleeding. She's bee onit lng ough that we can hold it for couple days without any risk for complication.

## 2015-06-26 ENCOUNTER — Other Ambulatory Visit: Payer: Self-pay | Admitting: Family Medicine

## 2015-07-11 MED ORDER — DILTIAZEM HCL ER COATED BEADS 120 MG PO CP24: ORAL_CAPSULE | ORAL | Status: DC

## 2015-07-11 NOTE — Addendum Note (Signed)
Addended by: Huel Cote on: 07/11/2015 10:11 AM   Modules accepted: Orders

## 2015-07-14 ENCOUNTER — Other Ambulatory Visit: Payer: Self-pay

## 2015-07-14 MED ORDER — DILTIAZEM HCL ER COATED BEADS 120 MG PO CP24: ORAL_CAPSULE | ORAL | Status: DC

## 2015-07-16 ENCOUNTER — Other Ambulatory Visit: Payer: Self-pay | Admitting: Family Medicine

## 2015-08-22 ENCOUNTER — Ambulatory Visit (INDEPENDENT_AMBULATORY_CARE_PROVIDER_SITE_OTHER): Admitting: Family Medicine

## 2015-08-22 ENCOUNTER — Encounter: Payer: Self-pay | Admitting: Family Medicine

## 2015-08-22 VITALS — BP 139/74 | HR 76 | Wt 354.0 lb

## 2015-08-22 DIAGNOSIS — Z114 Encounter for screening for human immunodeficiency virus [HIV]: Secondary | ICD-10-CM

## 2015-08-22 DIAGNOSIS — E1165 Type 2 diabetes mellitus with hyperglycemia: Secondary | ICD-10-CM

## 2015-08-22 DIAGNOSIS — I10 Essential (primary) hypertension: Secondary | ICD-10-CM | POA: Diagnosis not present

## 2015-08-22 DIAGNOSIS — IMO0001 Reserved for inherently not codable concepts without codable children: Secondary | ICD-10-CM

## 2015-08-22 LAB — POCT GLYCOSYLATED HEMOGLOBIN (HGB A1C): Hemoglobin A1C: 6.7

## 2015-08-22 NOTE — Progress Notes (Signed)
   Subjective:    Patient ID: Natalie Wright, female    DOB: [birth year], 65 y.o.   MRN: [id number]  HPI Hypertension- Pt denies chest pain, SOB, dizziness, or heart palpitations.  Taking meds as directed w/o problems.  Denies medication side effects.    Diabetes - Here for 3 month f/u.  No hypoglycemic events. No wounds or sores that are not healing well. No increased thirst or urination. Checking glucose at home. Taking medications as prescribed without any side effects.  Her eye exam is scheduled for today.  She will occ cut her long acting insulin to 20 units if her sugar is low.   Hyperlipidemia - Not currently on statin.  She had failed lipitor in the past adn has decided not to take nother statin at this time.    Review of Systems     Objective:   Physical Exam  Constitutional: She is oriented to person, place, and time. She appears well-developed and well-nourished.  HENT:  Head: Normocephalic and atraumatic.  Cardiovascular: Normal rate, regular rhythm and normal heart sounds.   Pulmonary/Chest: Effort normal and breath sounds normal.  Neurological: She is alert and oriented to person, place, and time.  Skin: Skin is warm and dry.  Psychiatric: She has a normal mood and affect. Her behavior is normal.          Assessment & Plan:  HTN - well controlled.  Continue current regimen. Follow up wiithin a 6 months.   DM- well controlled.  A1C is down to 6.7 today.  Great job!  Will need to f/u a few weeks after her bariatric surgery.    Moribd obesity - her scheduled surgery for march 21st.   Hyperlipidemia -

## 2015-10-07 ENCOUNTER — Other Ambulatory Visit: Payer: Self-pay | Admitting: Family Medicine

## 2015-10-10 ENCOUNTER — Ambulatory Visit: Admitting: Family Medicine

## 2015-10-14 ENCOUNTER — Other Ambulatory Visit: Payer: Self-pay | Admitting: Family Medicine

## 2015-10-17 ENCOUNTER — Other Ambulatory Visit: Payer: Self-pay

## 2015-10-17 MED ORDER — DILTIAZEM HCL ER COATED BEADS 120 MG PO CP24: ORAL_CAPSULE | ORAL | Status: DC

## 2015-10-17 MED ORDER — LISINOPRIL 20 MG PO TABS: 20.0000 mg | ORAL_TABLET | Freq: Every day | ORAL | Status: DC

## 2015-10-17 MED ORDER — CLOPIDOGREL BISULFATE 75 MG PO TABS: 75.0000 mg | ORAL_TABLET | Freq: Every day | ORAL | Status: DC

## 2015-11-01 ENCOUNTER — Other Ambulatory Visit: Payer: Self-pay | Admitting: Family Medicine

## 2016-02-10 ENCOUNTER — Encounter: Payer: Self-pay | Admitting: Family Medicine

## 2016-02-10 ENCOUNTER — Ambulatory Visit (INDEPENDENT_AMBULATORY_CARE_PROVIDER_SITE_OTHER): Payer: Medicare Other | Admitting: Family Medicine

## 2016-02-10 VITALS — BP 132/53 | HR 62 | Resp 16 | Ht 61.0 in | Wt 363.0 lb

## 2016-02-10 DIAGNOSIS — E1165 Type 2 diabetes mellitus with hyperglycemia: Secondary | ICD-10-CM

## 2016-02-10 DIAGNOSIS — Z111 Encounter for screening for respiratory tuberculosis: Secondary | ICD-10-CM | POA: Diagnosis not present

## 2016-02-10 DIAGNOSIS — IMO0001 Reserved for inherently not codable concepts without codable children: Secondary | ICD-10-CM

## 2016-02-10 DIAGNOSIS — I10 Essential (primary) hypertension: Secondary | ICD-10-CM | POA: Diagnosis not present

## 2016-02-10 LAB — POCT GLYCOSYLATED HEMOGLOBIN (HGB A1C): Hemoglobin A1C: 7.2

## 2016-02-10 MED ORDER — FLUCONAZOLE 150 MG PO TABS: 150.0000 mg | ORAL_TABLET | Freq: Once | ORAL | 4 refills | Status: DC

## 2016-02-10 MED ORDER — MECLIZINE HCL 25 MG PO TABS: ORAL_TABLET | ORAL | 1 refills | Status: DC

## 2016-02-10 MED ORDER — INSULIN GLARGINE 100 UNIT/ML ~~LOC~~ SOLN: 30.0000 [IU] | Freq: Every day | SUBCUTANEOUS | 11 refills | Status: DC

## 2016-02-10 MED ORDER — EMPAGLIFLOZIN 25 MG PO TABS: 25.0000 mg | ORAL_TABLET | Freq: Every day | ORAL | 1 refills | Status: DC

## 2016-02-10 MED ORDER — INSULIN PEN NEEDLE 30G X 8 MM MISC: 4 refills | Status: AC

## 2016-02-10 MED ORDER — METFORMIN HCL 1000 MG PO TABS: 1000.0000 mg | ORAL_TABLET | Freq: Two times a day (BID) | ORAL | 4 refills | Status: DC

## 2016-02-10 NOTE — Progress Notes (Signed)
Subjective:    CC: DM  HPI: Diabetes - She has had a few hypoglycemic events. He says that typically when she eats a much lighter evening meal. No wounds or sores that are not healing well. No increased thirst or urination. Checking glucose at home. Taking medications as prescribed without any side effects. Eye appointment is scheduled in about 2 weeks.  Morbid obesity - she is currently being seen by the bariatric department at [facility].  She was supposed to have her surgery in March. They're scheduling her to get in with a behavior list next week and she is still continue to work with nutritionist. The surgery got delayed and then she actually started taking a class while working full time and says it was really quite overwhelming and really lost sight of working on her weight loss.   She did need a PPD placement.  Hypertension- Pt denies chest pain, SOB, dizziness, or heart palpitations.  Taking meds as directed w/o problems.  Denies medication side effects.     Past medical history, Surgical history, Family history not pertinant except as noted below, Social history, Allergies, and medications have been entered into the medical record, reviewed, and corrections made.   Review of Systems: No fevers, chills, night sweats, weight loss, chest pain, or shortness of breath.   Objective:    General: Well Developed, well nourished, and in no acute distress.  Neuro: Alert and oriented x3, extra-ocular muscles intact, sensation grossly intact.  HEENT: Normocephalic, atraumatic  Skin: Warm and dry, no rashes. Cardiac: Regular rate and rhythm, no murmurs rubs or gallops, no lower extremity edema.  Respiratory: Clear to auscultation bilaterally. Not using accessory muscles, speaking in full sentences.   Impression and Recommendations:   Diabetes-uncontrolled. Unfortunately, her hemoglobin A1c is back up to 7.2. It may be that she was skipping her insulin doses when her sugar was too low. We  discussed that still important to give at least some insulin. She consider like upper down by about 3 units if her sugars low or if she eats more than usual. Work on getting back into some type of regular exercise as well. And follow-up in 3 months.  HTN - Well controlled. Continue current regimen. Follow up in  6 mo.   Morbid obesity-continue to work with nutritionist. She has an appointment coming up with behavior list as well. I'm hoping this will help. Encouraged her to really focus on herself and get back on track.  PPD placed.

## 2016-02-10 NOTE — Addendum Note (Signed)
Addended by: Teddy Spike on: 02/10/2016 04:40 PM   Modules accepted: Orders

## 2016-02-10 NOTE — Patient Instructions (Signed)
Can bump up lantus by 3 units if eats more heavy evening meal.  Can go down 3 units if blood sugar is low but don't skip dose.

## 2016-02-13 ENCOUNTER — Ambulatory Visit (INDEPENDENT_AMBULATORY_CARE_PROVIDER_SITE_OTHER): Payer: Medicare Other | Admitting: Family Medicine

## 2016-02-13 VITALS — BP 135/65 | HR 80 | Wt 369.0 lb

## 2016-02-13 DIAGNOSIS — Z111 Encounter for screening for respiratory tuberculosis: Secondary | ICD-10-CM

## 2016-02-13 DIAGNOSIS — Z7689 Persons encountering health services in other specified circumstances: Secondary | ICD-10-CM | POA: Diagnosis not present

## 2016-02-13 DIAGNOSIS — F341 Dysthymic disorder: Secondary | ICD-10-CM | POA: Diagnosis not present

## 2016-02-13 DIAGNOSIS — F4329 Adjustment disorder with other symptoms: Secondary | ICD-10-CM | POA: Diagnosis not present

## 2016-02-13 LAB — TB SKIN TEST
INDURATION: 0 mm
TB Skin Test: NEGATIVE

## 2016-02-13 NOTE — Progress Notes (Signed)
Pt came in to PPD read today. Pt reports she tested positive "about 15 years ago" but her chest xray was negative. Pt PPD read today was negative. Printed copy given to Pt for her employer. No further questions/concerns at this time.

## 2016-02-13 NOTE — Progress Notes (Signed)
Agree with above.  Catherine Metheney, MD  

## 2016-02-20 ENCOUNTER — Other Ambulatory Visit: Payer: Self-pay | Admitting: Family Medicine

## 2016-02-20 DIAGNOSIS — H25813 Combined forms of age-related cataract, bilateral: Secondary | ICD-10-CM | POA: Diagnosis not present

## 2016-02-20 DIAGNOSIS — H40003 Preglaucoma, unspecified, bilateral: Secondary | ICD-10-CM | POA: Diagnosis not present

## 2016-02-20 DIAGNOSIS — H43813 Vitreous degeneration, bilateral: Secondary | ICD-10-CM | POA: Diagnosis not present

## 2016-02-20 DIAGNOSIS — E119 Type 2 diabetes mellitus without complications: Secondary | ICD-10-CM | POA: Diagnosis not present

## 2016-02-20 LAB — HM DIABETES EYE EXAM

## 2016-02-21 ENCOUNTER — Other Ambulatory Visit: Payer: Self-pay | Admitting: *Deleted

## 2016-04-03 ENCOUNTER — Encounter: Payer: Self-pay | Admitting: Family Medicine

## 2016-04-16 ENCOUNTER — Other Ambulatory Visit: Payer: Self-pay | Admitting: Family Medicine

## 2016-04-17 ENCOUNTER — Encounter: Payer: Self-pay | Admitting: Family Medicine

## 2016-04-17 MED ORDER — FLUCONAZOLE 150 MG PO TABS: 150.0000 mg | ORAL_TABLET | Freq: Once | ORAL | 3 refills | Status: DC

## 2016-04-17 MED ORDER — FLUCONAZOLE 150 MG PO TABS: 150.0000 mg | ORAL_TABLET | Freq: Once | ORAL | 4 refills | Status: DC

## 2016-04-20 ENCOUNTER — Other Ambulatory Visit: Payer: Self-pay | Admitting: Family Medicine

## 2016-04-20 DIAGNOSIS — Z1231 Encounter for screening mammogram for malignant neoplasm of breast: Secondary | ICD-10-CM

## 2016-05-10 ENCOUNTER — Encounter: Payer: Self-pay | Admitting: Family Medicine

## 2016-05-10 ENCOUNTER — Ambulatory Visit (INDEPENDENT_AMBULATORY_CARE_PROVIDER_SITE_OTHER): Payer: Medicare Other | Admitting: Family Medicine

## 2016-05-10 VITALS — BP 134/58 | HR 71 | Ht 61.0 in | Wt 367.0 lb

## 2016-05-10 DIAGNOSIS — I10 Essential (primary) hypertension: Secondary | ICD-10-CM

## 2016-05-10 DIAGNOSIS — Z1159 Encounter for screening for other viral diseases: Secondary | ICD-10-CM

## 2016-05-10 DIAGNOSIS — H6121 Impacted cerumen, right ear: Secondary | ICD-10-CM

## 2016-05-10 DIAGNOSIS — IMO0001 Reserved for inherently not codable concepts without codable children: Secondary | ICD-10-CM

## 2016-05-10 DIAGNOSIS — Z23 Encounter for immunization: Secondary | ICD-10-CM

## 2016-05-10 DIAGNOSIS — Z114 Encounter for screening for human immunodeficiency virus [HIV]: Secondary | ICD-10-CM

## 2016-05-10 DIAGNOSIS — E1165 Type 2 diabetes mellitus with hyperglycemia: Secondary | ICD-10-CM | POA: Diagnosis not present

## 2016-05-10 DIAGNOSIS — Z780 Asymptomatic menopausal state: Secondary | ICD-10-CM

## 2016-05-10 LAB — POCT GLYCOSYLATED HEMOGLOBIN (HGB A1C): Hemoglobin A1C: 7.9

## 2016-05-10 MED ORDER — CLOTRIMAZOLE-BETAMETHASONE 1-0.05 % EX CREA: TOPICAL_CREAM | Freq: Two times a day (BID) | CUTANEOUS | 1 refills | Status: DC

## 2016-05-10 NOTE — Progress Notes (Signed)
Subjective:    CC:   HPI: Diabetes - no hypoglycemic events. No wounds or sores that are not healing well. No increased thirst or urination. Checking glucose at home. Taking medications as prescribed without any side effects.She's currently on Jardiance and metformin and Lantus. When I last saw her she had been skipping her insulin when her glucose was low.  Weight gain/morbid obesity-she has lost 2 pounds since she was last here. BMI 69. She is working with a Social worker over at [facility] in the bariatric department.   She also feels like her ears are blocked up. She mostly notices it in the mornings. She says she'll wake up and feel like they're completely clogged. Within a couple minutes after getting up she feels like they open up and she can hear normally. She's not really had any significant pain. No recent upper respiratory infection. No fevers or sweats. She does sometimes get some allergies around this time year but she reports she's been taking her Zyrtec daily.  Hypertension- Pt denies chest pain, SOB, dizziness, or heart palpitations.  Taking meds as directed w/o problems.  Denies medication side effects.      Past medical history, Surgical history, Family history not pertinant except as noted below, Social history, Allergies, and medications have been entered into the medical record, reviewed, and corrections made.   Review of Systems: No fevers, chills, night sweats, weight loss, chest pain, or shortness of breath.   Objective:    General: Well Developed, well nourished, and in no acute distress.  Neuro: Alert and oriented x3, extra-ocular muscles intact, sensation grossly intact.  HEENT: Normocephalic, atraumatic, Oropharynx is clear, left TM and canal is clear. Right canal is completely blocked by cerumen. I'm unable to visualize the tympanic membrane. No cervical lymphadenopathy.  Skin: Warm and dry, no rashes. Cardiac: Regular rate and rhythm, no murmurs rubs or gallops, no  lower extremity edema.  Respiratory: Clear to auscultation bilaterally. Not using accessory muscles, speaking in full sentences.   Impression and Recommendations:   Diabetes-uncontrolled. Hemoglobin A1c at the 7.9. Last one was 7.2. She says she has been under a lot of stress recently. I hesitate to increase her insulin because she's actually had a couple of hypoglycemic events. She says that when she eats a light meal she will tend to have a hypoglycemic episode overnight. She is taking her Jardiance and her metformin. Encouraged her to get back on track with diet and exercise. She has lost 2 pounds which is a great start.  Hypertension-Well controlled. Continue current regimen. Follow up in  3-4 months.    Right ear cerumen impaction-irrigation performed today. Patient tolerated well.  Shingles vaccine and flu vaccine given today.  She reports her mammogram is scheduled for later this month.

## 2016-05-11 ENCOUNTER — Ambulatory Visit: Admitting: Family Medicine

## 2016-05-18 ENCOUNTER — Ambulatory Visit (INDEPENDENT_AMBULATORY_CARE_PROVIDER_SITE_OTHER): Payer: Medicare Other

## 2016-05-18 DIAGNOSIS — Z1231 Encounter for screening mammogram for malignant neoplasm of breast: Secondary | ICD-10-CM

## 2016-05-29 ENCOUNTER — Other Ambulatory Visit: Payer: Self-pay | Admitting: Family Medicine

## 2016-06-04 DIAGNOSIS — I10 Essential (primary) hypertension: Secondary | ICD-10-CM | POA: Diagnosis not present

## 2016-06-04 DIAGNOSIS — Z8673 Personal history of transient ischemic attack (TIA), and cerebral infarction without residual deficits: Secondary | ICD-10-CM | POA: Diagnosis not present

## 2016-06-04 DIAGNOSIS — Z7902 Long term (current) use of antithrombotics/antiplatelets: Secondary | ICD-10-CM | POA: Diagnosis not present

## 2016-06-04 DIAGNOSIS — R002 Palpitations: Secondary | ICD-10-CM | POA: Diagnosis not present

## 2016-06-04 DIAGNOSIS — Z79899 Other long term (current) drug therapy: Secondary | ICD-10-CM | POA: Diagnosis not present

## 2016-06-04 DIAGNOSIS — E739 Lactose intolerance, unspecified: Secondary | ICD-10-CM | POA: Diagnosis not present

## 2016-06-04 DIAGNOSIS — Z794 Long term (current) use of insulin: Secondary | ICD-10-CM | POA: Diagnosis not present

## 2016-06-04 DIAGNOSIS — E119 Type 2 diabetes mellitus without complications: Secondary | ICD-10-CM | POA: Diagnosis not present

## 2016-06-04 DIAGNOSIS — R9431 Abnormal electrocardiogram [ECG] [EKG]: Secondary | ICD-10-CM | POA: Diagnosis not present

## 2016-06-04 DIAGNOSIS — Z888 Allergy status to other drugs, medicaments and biological substances status: Secondary | ICD-10-CM | POA: Diagnosis not present

## 2016-06-04 DIAGNOSIS — Z882 Allergy status to sulfonamides status: Secondary | ICD-10-CM | POA: Diagnosis not present

## 2016-06-04 DIAGNOSIS — Z7901 Long term (current) use of anticoagulants: Secondary | ICD-10-CM | POA: Diagnosis not present

## 2016-06-04 DIAGNOSIS — Z87891 Personal history of nicotine dependence: Secondary | ICD-10-CM | POA: Diagnosis not present

## 2016-06-04 LAB — TSH: TSH: 1.58 u[IU]/mL (ref 0.41–5.90)

## 2016-06-04 LAB — HEPATIC FUNCTION PANEL
ALK PHOS: 80 U/L (ref 25–125)
ALT: 25 U/L (ref 7–35)
AST: 30 U/L (ref 13–35)
BILIRUBIN, TOTAL: 0.4 mg/dL

## 2016-06-04 LAB — BASIC METABOLIC PANEL
BUN: 18 mg/dL (ref 4–21)
Creatinine: 0.6 mg/dL (ref 0.5–1.1)
Glucose: 162 mg/dL
Potassium: 4.7 mmol/L (ref 3.4–5.3)
SODIUM: 146 mmol/L (ref 137–147)

## 2016-06-04 LAB — CBC AND DIFFERENTIAL
HEMATOCRIT: 45 % (ref 36–46)
HEMOGLOBIN: 13.6 g/dL (ref 12.0–16.0)
Platelets: 273 10*3/uL (ref 150–399)
WBC: 7.6 10^3/mL

## 2016-06-08 ENCOUNTER — Encounter: Payer: Self-pay | Admitting: Family Medicine

## 2016-06-08 ENCOUNTER — Ambulatory Visit (INDEPENDENT_AMBULATORY_CARE_PROVIDER_SITE_OTHER): Payer: Medicare Other | Admitting: Family Medicine

## 2016-06-08 VITALS — BP 148/50 | HR 61 | Ht 61.0 in | Wt 364.0 lb

## 2016-06-08 DIAGNOSIS — R002 Palpitations: Secondary | ICD-10-CM

## 2016-06-08 DIAGNOSIS — G4733 Obstructive sleep apnea (adult) (pediatric): Secondary | ICD-10-CM | POA: Diagnosis not present

## 2016-06-08 DIAGNOSIS — Z1159 Encounter for screening for other viral diseases: Secondary | ICD-10-CM | POA: Diagnosis not present

## 2016-06-08 DIAGNOSIS — R0602 Shortness of breath: Secondary | ICD-10-CM | POA: Diagnosis not present

## 2016-06-08 DIAGNOSIS — Z114 Encounter for screening for human immunodeficiency virus [HIV]: Secondary | ICD-10-CM | POA: Diagnosis not present

## 2016-06-08 DIAGNOSIS — I10 Essential (primary) hypertension: Secondary | ICD-10-CM | POA: Diagnosis not present

## 2016-06-08 DIAGNOSIS — E1165 Type 2 diabetes mellitus with hyperglycemia: Secondary | ICD-10-CM | POA: Diagnosis not present

## 2016-06-08 MED ORDER — AMBULATORY NON FORMULARY MEDICATION: 0 refills | Status: DC

## 2016-06-08 NOTE — Progress Notes (Addendum)
Subjective:    CC:   HPI: Woke up Sunday night with feeling like her heart was racing.  Says it kept waking her up to finally went to the ED.  Had a negative CXR and EKG at that time.  Seen at [facility] on 12/4 for the palpitations. she was seen this morning at the cardiologist and is on a 48 hour heart monitor, she will have a 2D echo done 1/12 @ [facility].  She still had some palpitations since then but not as intense. She says they really only last for about 3-5 seconds when they occur. She did go see cardiology this morning and they have placed her on a 48-hour Holter monitor. She denies any recent changes that she admits that she has been drinking more caffeine than usual. She drinks half caffeinated coffee but she actually drinks almost 40 ounces a day. Typically 20 ounces in the morning and 20 ounces later in the day. She does have a diagnosis of sleep apnea but says she has been using her CPAP regularly and has not had any problems such as frequent leaks of the mask etc. Just etc. She describes the heart sensation as a fluttering. It's not painful.  Echocardiogram is scheduled for January 12 at [facility].  Past medical history, Surgical history, Family history not pertinant except as noted below, Social history, Allergies, and medications have been entered into the medical record, reviewed, and corrections made.   Review of Systems: No fevers, chills, night sweats, weight loss, chest pain, or shortness of breath.   Objective:    General: Well Developed, well nourished, and in no acute distress.  Neuro: Alert and oriented x3, extra-ocular muscles intact, sensation grossly intact.  HEENT: Normocephalic, atraumatic  Skin: Warm and dry, no rashes. Cardiac: Regular rate and rhythm, no murmurs rubs or gallops, no lower extremity edema. No carotid bruits.  Respiratory: Clear to auscultation bilaterally. Not using accessory muscles, speaking in full sentences.  Assessment and plan:     Palpitations-has monitor on today. Is following with cardiology and they will work it up further. We did discuss that it may be a benign condition such as PVCs or PACs which can be easily treated with medication to help control them. her to back off of her caffeine intake.  Sleep apnea-will call and get a download for the last 30 days for her CPAP just to make sure that she's not having any issues. She might even want to consider downloading Her Smart Phone.

## 2016-06-09 LAB — COMPLETE METABOLIC PANEL WITH GFR
ALT: 17 U/L (ref 6–29)
AST: 18 U/L (ref 10–35)
Albumin: 4.3 g/dL (ref 3.6–5.1)
Alkaline Phosphatase: 71 U/L (ref 33–130)
BILIRUBIN TOTAL: 0.6 mg/dL (ref 0.2–1.2)
BUN: 17 mg/dL (ref 7–25)
CALCIUM: 9.4 mg/dL (ref 8.6–10.4)
CHLORIDE: 104 mmol/L (ref 98–110)
CO2: 26 mmol/L (ref 20–31)
CREATININE: 0.7 mg/dL (ref 0.50–0.99)
GFR, Est Non African American: >89 mL/min (ref >=60)
Glucose, Bld: 141 mg/dL — ABNORMAL HIGH (ref 65–99)
Potassium: 4.7 mmol/L (ref 3.5–5.3)
Sodium: 142 mmol/L (ref 135–146)
TOTAL PROTEIN: 7.2 g/dL (ref 6.1–8.1)

## 2016-06-09 LAB — LIPID PANEL
CHOLESTEROL: 168 mg/dL (ref <200)
HDL: 54 mg/dL (ref >50)
LDL CALC: 94 mg/dL (ref <100)
TRIGLYCERIDES: 101 mg/dL (ref <150)
Total CHOL/HDL Ratio: 3.1 Ratio (ref <5.0)
VLDL: 20 mg/dL (ref <30)

## 2016-06-09 LAB — HIV ANTIBODY (ROUTINE TESTING W REFLEX): HIV: NONREACTIVE

## 2016-06-09 LAB — HEPATITIS C ANTIBODY: HCV Ab: NEGATIVE

## 2016-06-11 DIAGNOSIS — R002 Palpitations: Secondary | ICD-10-CM | POA: Diagnosis not present

## 2016-06-11 NOTE — Progress Notes (Signed)
All labs are normal. 

## 2016-06-15 DIAGNOSIS — R0602 Shortness of breath: Secondary | ICD-10-CM | POA: Diagnosis not present

## 2016-06-15 DIAGNOSIS — R002 Palpitations: Secondary | ICD-10-CM | POA: Diagnosis not present

## 2016-06-26 ENCOUNTER — Ambulatory Visit (INDEPENDENT_AMBULATORY_CARE_PROVIDER_SITE_OTHER): Payer: Medicare Other

## 2016-06-26 ENCOUNTER — Encounter: Payer: Self-pay | Admitting: Family Medicine

## 2016-06-26 DIAGNOSIS — Z780 Asymptomatic menopausal state: Secondary | ICD-10-CM | POA: Diagnosis not present

## 2016-07-05 ENCOUNTER — Encounter: Payer: Self-pay | Admitting: Family Medicine

## 2016-07-13 ENCOUNTER — Encounter: Payer: Self-pay | Admitting: Family Medicine

## 2016-07-13 DIAGNOSIS — I253 Aneurysm of heart: Secondary | ICD-10-CM | POA: Diagnosis not present

## 2016-07-13 DIAGNOSIS — I517 Cardiomegaly: Secondary | ICD-10-CM | POA: Diagnosis not present

## 2016-07-16 ENCOUNTER — Encounter: Payer: Self-pay | Admitting: Family Medicine

## 2016-07-16 ENCOUNTER — Telehealth: Payer: Self-pay | Admitting: Family Medicine

## 2016-07-16 DIAGNOSIS — I517 Cardiomegaly: Secondary | ICD-10-CM | POA: Insufficient documentation

## 2016-07-16 DIAGNOSIS — R002 Palpitations: Secondary | ICD-10-CM | POA: Insufficient documentation

## 2016-07-16 DIAGNOSIS — I253 Aneurysm of heart: Secondary | ICD-10-CM | POA: Insufficient documentation

## 2016-07-16 DIAGNOSIS — G4733 Obstructive sleep apnea (adult) (pediatric): Secondary | ICD-10-CM

## 2016-07-16 NOTE — Telephone Encounter (Signed)
Call her home health to get download on hr CPAP. WE fax a script but we haven't received the report yet.   Natalie Lecher, MD

## 2016-07-19 ENCOUNTER — Encounter: Payer: Self-pay | Admitting: Family Medicine

## 2016-07-20 NOTE — Telephone Encounter (Signed)
Pt uses [employer]. Downloaded requested.

## 2016-07-26 NOTE — Telephone Encounter (Signed)
Pt advised of results and recommendations, verbalized understanding. No further questions at this time.

## 2016-07-26 NOTE — Telephone Encounter (Signed)
Please call patient: I did receive her complaints report for her CPAP. It looks like it's working really well. Continue with regular use. Try to encourage her to use it closer to 8 hours in night if possible. Beatrice Lecher, MD

## 2016-08-10 ENCOUNTER — Encounter: Payer: Self-pay | Admitting: Family Medicine

## 2016-08-10 ENCOUNTER — Other Ambulatory Visit: Payer: Self-pay | Admitting: Family Medicine

## 2016-08-10 ENCOUNTER — Ambulatory Visit (INDEPENDENT_AMBULATORY_CARE_PROVIDER_SITE_OTHER): Payer: Medicare Other | Admitting: Family Medicine

## 2016-08-10 VITALS — BP 137/53 | HR 57 | Ht 61.0 in | Wt 374.0 lb

## 2016-08-10 DIAGNOSIS — R600 Localized edema: Secondary | ICD-10-CM | POA: Diagnosis not present

## 2016-08-10 DIAGNOSIS — G4733 Obstructive sleep apnea (adult) (pediatric): Secondary | ICD-10-CM | POA: Diagnosis not present

## 2016-08-10 DIAGNOSIS — E1165 Type 2 diabetes mellitus with hyperglycemia: Secondary | ICD-10-CM

## 2016-08-10 DIAGNOSIS — R918 Other nonspecific abnormal finding of lung field: Secondary | ICD-10-CM | POA: Diagnosis not present

## 2016-08-10 DIAGNOSIS — I878 Other specified disorders of veins: Secondary | ICD-10-CM

## 2016-08-10 DIAGNOSIS — R002 Palpitations: Secondary | ICD-10-CM

## 2016-08-10 DIAGNOSIS — IMO0001 Reserved for inherently not codable concepts without codable children: Secondary | ICD-10-CM

## 2016-08-10 DIAGNOSIS — I10 Essential (primary) hypertension: Secondary | ICD-10-CM

## 2016-08-10 LAB — POCT GLYCOSYLATED HEMOGLOBIN (HGB A1C): Hemoglobin A1C: 7.4

## 2016-08-10 MED ORDER — FUROSEMIDE 20 MG PO TABS: 20.0000 mg | ORAL_TABLET | Freq: Every day | ORAL | 0 refills | Status: DC | PRN

## 2016-08-10 MED ORDER — AMBULATORY NON FORMULARY MEDICATION: 0 refills | Status: AC

## 2016-08-10 MED ORDER — DILTIAZEM HCL ER COATED BEADS 120 MG PO CP24: ORAL_CAPSULE | ORAL | 0 refills | Status: DC

## 2016-08-10 MED ORDER — AMBULATORY NON FORMULARY MEDICATION: 0 refills | Status: DC

## 2016-08-10 NOTE — Patient Instructions (Signed)
Lantus 35-38 units nightly.

## 2016-08-10 NOTE — Progress Notes (Addendum)
Subjective:    CC: DM  HPI:  Diabetes - no hypoglycemic events. No wounds or sores that are not healing well. No increased thirst or urination. Checking glucose at home. Taking medications as prescribed without any side effects.She is currently using 30-35 units of Lantus. She says that she is actually doing a lot better with her diet and is now mostly eating salads for dinner and feels like that has made a difference.  Hypertension- Pt denies chest pain, SOB, dizziness, or heart palpitations.  Taking meds as directed w/o problems.  Denies medication side effects.  She is now on diltiazem 3 times a day instead of twice a day. She does report that she's had some ankle and foot swelling more recently. Try to get her to quantify a timeframe for when she started to notice increase in symptoms as she always has some chronic swelling. She was unable to define it but did note that it is getting uncomfortable and wanted to see the had any recommendations to get this under better control.  Heart flutter/palpitations. She started getting these more frequently recently. She says that she saw the cardiologist and they actually increase her Cardizem to 3 times a day. She says it has Artie made a difference and feels like it's now under better control though she still occasionally gets a sensation. Next  She said she was also told that she had pulmonary nodules at one point from some type of imaging scan. She wants to know when that needs to be repeated.  Obstructive sleep apnea-she is having some problem with her CPAP machine. She feels that the humidifier is not actually working and coming through the tube to actually humidify her mouth. She says she tends to sleep with her mouth open and just wakes up with it feeling very dry. She also feels like there is more suction on her jaws and cheeks when she tries to take a breath in there should be.  Past medical history, Surgical history, Family history not pertinant  except as noted below, Social history, Allergies, and medications have been entered into the medical record, reviewed, and corrections made.   Review of Systems: No fevers, chills, night sweats, weight loss, chest pain, or shortness of breath.   Objective:    General: Well Developed, well nourished, and in no acute distress.  Neuro: Alert and oriented x3, extra-ocular muscles intact, sensation grossly intact.  HEENT: Normocephalic, atraumatic  Skin: Warm and dry, no rashes. Cardiac: Regular rate and rhythm, no murmurs rubs or gallops, no lower extremity edema.  Respiratory: Clear to auscultation bilaterally. Not using accessory muscles, speaking in full sentences. Ext: Bilateral lower leg and foot edema. Pitting over the foot and ankle.   Impression and Recommendations:   DM- uncontrolled. A1C of 7.4, Though improved from previous of 7.9. I do think we are making some strides. I want her to increase her Lantus up to 35-38 units. Continue to work on healthy diet and trying to increase activity levels. Follow-up in 3 months.  HTN - per continue current regimen.   Palpitations-much better controlled on her increased dose of Cardizem. Her symptoms have improved.  Lower extremity edema of feet and ankles-most consistent with venous stasis. we'll give her prescription for furosemide to use once a day as needed. Encouraged her not to use it every day but to keep track of how often she is using it. Also recommended compression stockings. I think these will be very helpful for her but because of  the shape of her leg she will need some custom fit stockings.  Pulmonary nodules-I did find a CT of the chest performed in October 2013. At that point in time they have been stable for almost 18 months and they had recommended one more follow-up to confirm a full 2 years of stability. We had evidently ordered another CT later that year but it looks like it was never performed.  Obstructive sleep apnea-we'll  send over in order to her home health to see if they could service her machine as it sounds like it is not working properly. Her humidifier may be broken as well.  We did get a download of her complaints report. Current pressure is set to 19 cm water pressure. An AHI was 1.5. She used the machine for 29 out of 30 days and she used it for greater than 4 hours on 27 of those days which is approximately 90%. Her average usage is around 6 hours and 22 minutes.  Time spent 40 minutes, greater than 50% of the time spent counseling about diabetes, hypertension, palpitations, lower extremity swelling, pulmonary nodules

## 2016-08-13 ENCOUNTER — Telehealth: Payer: Self-pay | Admitting: Family Medicine

## 2016-08-13 NOTE — Telephone Encounter (Signed)
Faxed order for custom compression hose to [facility] medical supply. Phone: [phone]. Fax: [phone]. They have an office in [place] and [place]. Information left on Pt's VM.

## 2016-08-14 ENCOUNTER — Encounter: Payer: Self-pay | Admitting: Family Medicine

## 2016-08-15 NOTE — Telephone Encounter (Signed)
Called [employer], spoke with [place]. Cpap pressure order changed from 35mm down to 69mm. Will fax over form for PCP to sign.

## 2016-08-17 ENCOUNTER — Encounter: Payer: Self-pay | Admitting: Family Medicine

## 2016-08-27 ENCOUNTER — Encounter: Payer: Self-pay | Admitting: Family Medicine

## 2016-08-30 ENCOUNTER — Ambulatory Visit (INDEPENDENT_AMBULATORY_CARE_PROVIDER_SITE_OTHER): Payer: Medicare Other

## 2016-08-30 DIAGNOSIS — R918 Other nonspecific abnormal finding of lung field: Secondary | ICD-10-CM | POA: Diagnosis not present

## 2016-08-30 DIAGNOSIS — K449 Diaphragmatic hernia without obstruction or gangrene: Secondary | ICD-10-CM | POA: Diagnosis not present

## 2016-08-30 DIAGNOSIS — I700 Atherosclerosis of aorta: Secondary | ICD-10-CM

## 2016-08-31 ENCOUNTER — Encounter: Payer: Self-pay | Admitting: Family Medicine

## 2016-08-31 DIAGNOSIS — I700 Atherosclerosis of aorta: Secondary | ICD-10-CM | POA: Insufficient documentation

## 2016-09-07 DIAGNOSIS — I471 Supraventricular tachycardia: Secondary | ICD-10-CM | POA: Diagnosis not present

## 2016-09-07 DIAGNOSIS — R002 Palpitations: Secondary | ICD-10-CM | POA: Diagnosis not present

## 2016-09-07 DIAGNOSIS — I10 Essential (primary) hypertension: Secondary | ICD-10-CM | POA: Diagnosis not present

## 2016-09-14 ENCOUNTER — Ambulatory Visit (INDEPENDENT_AMBULATORY_CARE_PROVIDER_SITE_OTHER): Payer: Medicare Other | Admitting: Family Medicine

## 2016-09-14 ENCOUNTER — Encounter: Payer: Self-pay | Admitting: Family Medicine

## 2016-09-14 ENCOUNTER — Telehealth: Payer: Self-pay | Admitting: Family Medicine

## 2016-09-14 VITALS — BP 137/62 | HR 70 | Ht 61.0 in | Wt 369.0 lb

## 2016-09-14 DIAGNOSIS — R600 Localized edema: Secondary | ICD-10-CM

## 2016-09-14 DIAGNOSIS — G4733 Obstructive sleep apnea (adult) (pediatric): Secondary | ICD-10-CM | POA: Diagnosis not present

## 2016-09-14 DIAGNOSIS — I10 Essential (primary) hypertension: Secondary | ICD-10-CM | POA: Diagnosis not present

## 2016-09-14 NOTE — Patient Instructions (Signed)
Continue to take the Lasix once a day. Don't drink more than 64 ounces of fluid a day Then recheck blood in the middle of next week

## 2016-09-14 NOTE — Telephone Encounter (Signed)
Please call the representative for [employer]. I have a patient he is having difficulty with her CPAP. She truly feels like the humidity is not working on it even when she turns it completely up. She has had CPAP before this machine so she feels like there is a true difference even though when she has called the company that they say that the readings are normal. I want to see if someone could actually physically look at the machine and just make sure that everything is attached and working appropriately. May be a sensor etc. needs to be changed. She also read in the book that using a heated tube could help and maybe even a chinstrap so certainly these are options that we could try but I would like to have the representative check into this and see what we can come up with.

## 2016-09-14 NOTE — Progress Notes (Signed)
Subjective:    Patient ID: Natalie Wright, female    DOB: December 19, 1950, 66 y.o.   MRN: [id number]  HPI Hypertension-she did follow-up with her cardiologist on March 9, proximally 7 days ago. Her blood pressure was still elevated so they increased her diltiazem to 360 mg and also increase her lisinopril to 40 mg. They went over her Holter monitor and echocardiogram results and recommended that she continue using her CPAP regularly for her sleep apnea. They recommended a repeat BMP in about 2 weeks after starting her new medication..  She is here today to follow-up on her lower extremity swelling. She feels like the diuretic is not really working. She said she took it for short period time. She became ill with upper a story infection so stopped it because she felt like she was getting a little too dry since she was eating and drinking very much. She restarted this week and has been on it for 3 days consecutively. She says she noticed that the tightness and discomfort in her feet is better but says they still look very swollen to her. Her swelling is significantly better in the mornings and worse in the evenings. She did check into getting compression stockings as they will need to be custom fit. They are only available to do measurements on Mondays and Fridays so she plans on going on Easter Monday to be measured and fit.   OSA - She truly feels like the humidity is not working on it even when she turns it completely up. She has had CPAP before this machine so she feels like there is a true difference even though when she has called the company that they say that the readings are normal. I want to see if someone could actually physically look at the machine and just make sure that everything is attached and working appropriately. May be a sensor problem,  etc. needs to be changed. She also read in the book that using a heated tube could help and maybe even a chinstrap so certainly these are options that we  could try but I would like to have the representative check into this and see what we can come up with.  Review of Systems     Objective:   Physical Exam  Constitutional: She is oriented to person, place, and time. She appears well-developed and well-nourished.  HENT:  Head: Normocephalic and atraumatic.  Eyes: Conjunctivae and EOM are normal.  Cardiovascular: Normal rate.   Pulmonary/Chest: Effort normal.  Musculoskeletal:  2+ pitting edema on the left foot and 1+ pitting edema on the right foot.  Neurological: She is alert and oriented to person, place, and time.  Skin: Skin is dry. No pallor.  Psychiatric: She has a normal mood and affect. Her behavior is normal.  Vitals reviewed.      Assessment & Plan:  HTN - Blood pressure at goal on increased dose of lisinopril.  LE swelling - 10 you with furosemide daily for 1 week and then go for BMP. If everything looks okay we may be able to bump up her dose. Explained that the compression stockings are very important to helping to control the swelling so encouraged her to try to make that appointment next month. She may also have some component of lymphedema secondary to her obesity.  Obstructive sleep apnea-we'll try to contact representative from [employer] to see if they may have some suggestions on improving the humidity on her device. We could look into a heated  tube or possible chin strap

## 2016-09-17 NOTE — Telephone Encounter (Signed)
Called [employer] and left VM for therapist Vicente Males. Advised of Pt's complaint. Callback information provided.

## 2016-09-19 NOTE — Telephone Encounter (Signed)
Spoke with [facility], they do not have a CPAP order for Pt.  Called [employer], spoke with Thayer Headings. They do provide and monitor Pt's CPAP machine. They will contact Pt and set up time when CPAP machine can be evaluated.

## 2016-09-19 NOTE — Telephone Encounter (Signed)
Left second VM today for [employer]. Requested callback, information provided.

## 2016-10-01 DIAGNOSIS — I10 Essential (primary) hypertension: Secondary | ICD-10-CM | POA: Diagnosis not present

## 2016-10-01 DIAGNOSIS — R600 Localized edema: Secondary | ICD-10-CM | POA: Diagnosis not present

## 2016-10-02 LAB — BASIC METABOLIC PANEL WITH GFR
BUN: 23 mg/dL (ref 7–25)
CHLORIDE: 102 mmol/L (ref 98–110)
CO2: 30 mmol/L (ref 20–31)
CREATININE: 0.81 mg/dL (ref 0.50–0.99)
Calcium: 9.3 mg/dL (ref 8.6–10.4)
GFR, Est African American: 88 mL/min (ref >=60)
GFR, Est Non African American: 76 mL/min (ref >=60)
Glucose, Bld: 149 mg/dL — ABNORMAL HIGH (ref 65–99)
Potassium: 4.6 mmol/L (ref 3.5–5.3)
SODIUM: 141 mmol/L (ref 135–146)

## 2016-10-03 NOTE — Progress Notes (Signed)
All labs are normal. 

## 2016-10-12 ENCOUNTER — Ambulatory Visit: Payer: Medicare Other | Admitting: Family Medicine

## 2016-10-15 ENCOUNTER — Encounter: Payer: Self-pay | Admitting: Family Medicine

## 2016-10-16 ENCOUNTER — Telehealth: Payer: Self-pay

## 2016-10-16 NOTE — Telephone Encounter (Signed)
See My Chart e-mail below.  I'm not sure if you have received a fax from [facility].        An order to [facility] was made several months ago for compression hose. I did go to be measured . They are needing to change what was ordered. They stated they have left messages for someone to call them about changing up one level. The company does not offer the compressions hose for my size calf in Dr. Madilyn Fireman' s current order. They do offer my size in the next compression level.Marland KitchenMarland KitchenPlease give them a call.

## 2016-10-16 NOTE — Telephone Encounter (Signed)
I haven't seen this.  They may need to refax this.   OK to change compression stocking.

## 2016-10-16 NOTE — Telephone Encounter (Signed)
Left a message for a return call.

## 2016-10-17 ENCOUNTER — Ambulatory Visit (INDEPENDENT_AMBULATORY_CARE_PROVIDER_SITE_OTHER): Payer: Medicare Other | Admitting: Family Medicine

## 2016-10-17 ENCOUNTER — Encounter: Payer: Self-pay | Admitting: Family Medicine

## 2016-10-17 VITALS — BP 125/35 | HR 61 | Ht 61.0 in | Wt 377.0 lb

## 2016-10-17 DIAGNOSIS — I10 Essential (primary) hypertension: Secondary | ICD-10-CM | POA: Diagnosis not present

## 2016-10-17 DIAGNOSIS — R600 Localized edema: Secondary | ICD-10-CM | POA: Diagnosis not present

## 2016-10-17 DIAGNOSIS — G4733 Obstructive sleep apnea (adult) (pediatric): Secondary | ICD-10-CM | POA: Diagnosis not present

## 2016-10-17 MED ORDER — EMPAGLIFLOZIN 25 MG PO TABS: 25.0000 mg | ORAL_TABLET | Freq: Every day | ORAL | 1 refills | Status: DC

## 2016-10-17 NOTE — Patient Instructions (Signed)
Hold lasix for next 2 days.  Nurse visit Friday to recheck.

## 2016-10-17 NOTE — Progress Notes (Signed)
Subjective:    CC: HTN   HPI:  Hypertension- Pt denies chest pain, SOB, dizziness, or heart palpitations.  Taking meds as directed w/o problems.  Denies medication side effects.  Her lisinopril was increased to 40mg  at Cardiology.  She has already had Holter Monitor and echocardiogram with them. She is also on diltiazem 60 mg.  LE swelling - She is also been taking Lasix 20 mg daily for lower extremity swelling. She says she notices they don't hurt as much but hasn't noticed much change in his visual impairments of her ankles.   Felt like she was having problems with her CPAP. Emailed [employer] about her download. She has an appt with Dr. Michela Pitcher ( sleep and pulm) medicine.  Still waking up tired and feels like her heart fluttering.  She is concerned because for the last 3 days she's actually been falling asleep at her desk. She says that was happening when she was having problems with her CPAP and wonders if that could be going on. That she did cough with a download in her download looks fantastic. Her AHI is 0.3 which looks fantastic.  She also had 3 days last week in a few days in December where she said her dog actually noticed her woke her up in the middle the night. Purposely. She says normally her dog never does this and actually sleeps through the night. And she noticed at that time that she will cut feeling poorly. She was having palpitations and having difficulty breathing. This happened again last week for about 3 nights in a row. She has worn a Holter monitor previously but unfortunately it was not during one of these episodes. She's not sure what would have happened or change differently that she would have had these episodes. She is very concerned about them.  Past medical history, Surgical history, Family history not pertinant except as noted below, Social history, Allergies, and medications have been entered into the medical record, reviewed, and corrections made.   Review of Systems:  No fevers, chills, night sweats, weight loss, chest pain, or shortness of breath.   Objective:     Vitals:   10/17/16 0958  Weight: (!) 377 lb (171 kg)  Height: 5\' 1"  (1.549 m)    Blood pressure (!) 125/35, pulse 61, height 5\' 1"  (1.549 m), weight (!) 377 lb (171 kg), SpO2 95 %.  General: Well Developed, well nourished, and in no acute distress.  Neuro: Alert and oriented x3, extra-ocular muscles intact, sensation grossly intact.  HEENT: Normocephalic, atraumatic  Skin: Warm and dry, no rashes. Cardiac: Regular rate and rhythm, no murmurs rubs or gallops, no lower extremity edema.  Respiratory: Clear to auscultation bilaterally. Not using accessory muscles, speaking in full sentences.   Impression and Recommendations:    HTN - Blood pressure actually looks fantastic today. Is initially high when she first got here but after we rechecked it we actually realized that her diastolic pressure was extremely low. Upon several rechecks it was still low. I'm to have her actually hold her Lasix for the next 2 days and come back in for nurse blood pressure check. I'm wondering if this was diastolic pressure is part of what has made her feel extremely fatigued over the last 3 days. I'm not convinced that it's actually her CPAP. So encouraged her to check her blood glucose when she starts to not feel well  LE swelling - some real mild improvement with the Lasix. Originally I was going to up  on her Lasix to 40 mg for a week and then recheck her BMP. After realizing her diastolic was really low today we decided to hold off and affect hold the Lasix completely for 2 days and then recheck her pressure at that time as well as see if she is feeling better. She had a bump in her lisinopril and Lasix was added all within the last 4-6 weeks.  Fatigue-I suspect this is probably not related to her CPAP. Interestingly her diastolic blood pressure upon recheck today was extremely low and she says when she would  close her eyes and try to relax she would almost feel like she was going to pass out. We did do some orthostatics on her today which were essentially normal but still with a persistently low diastolic pressure. Actually have her hold her Lasix for 2 days and come back for nurse blood pressure check on Friday and see if she is feeling better and if her diastolic pressure has improved as well.  Obstructive sleep apnea-she feels like she still continued to have some palms with her CPAP. She did go ahead and order the heated tube to see if this helps with the humidity. Her AHI looks fantastic. She has a consultation with Dr. Michela Pitcher local pulmonary and sleep Dr. I believe next week.

## 2016-10-19 ENCOUNTER — Ambulatory Visit (INDEPENDENT_AMBULATORY_CARE_PROVIDER_SITE_OTHER): Payer: Medicare Other | Admitting: Family Medicine

## 2016-10-19 VITALS — BP 167/47 | HR 72 | Wt 376.0 lb

## 2016-10-19 DIAGNOSIS — I10 Essential (primary) hypertension: Secondary | ICD-10-CM | POA: Diagnosis not present

## 2016-10-19 NOTE — Progress Notes (Signed)
Please see if she's also feeling any better. She was feeling extreme and sedated and tired. If she is feeling better than we can restart the Lasix but taking 1 every other day and continue to monitor blood pressures at home.

## 2016-10-19 NOTE — Progress Notes (Signed)
Patient here for blood pressure check. Patient has been off of her Lasix for two days due to diastolic BP being too low. Patient brought in her BP log today. Bp log in providers box to review. Her home this am  was pretty consistant with what we got in the office today.

## 2016-10-22 NOTE — Progress Notes (Signed)
Left message on patinent's vm with Dr. Gardiner Ramus recommendations.

## 2016-10-22 NOTE — Telephone Encounter (Signed)
Pt was advised during ov.Natalie Wright

## 2016-10-23 ENCOUNTER — Other Ambulatory Visit: Payer: Self-pay | Admitting: Family Medicine

## 2016-10-23 ENCOUNTER — Encounter: Payer: Self-pay | Admitting: Family Medicine

## 2016-10-23 MED ORDER — FUROSEMIDE 20 MG PO TABS: ORAL_TABLET | ORAL | 0 refills | Status: DC

## 2016-10-25 DIAGNOSIS — I10 Essential (primary) hypertension: Secondary | ICD-10-CM | POA: Diagnosis not present

## 2016-10-25 DIAGNOSIS — Z9989 Dependence on other enabling machines and devices: Secondary | ICD-10-CM | POA: Diagnosis not present

## 2016-10-25 DIAGNOSIS — R911 Solitary pulmonary nodule: Secondary | ICD-10-CM | POA: Diagnosis not present

## 2016-10-25 DIAGNOSIS — G4719 Other hypersomnia: Secondary | ICD-10-CM | POA: Diagnosis not present

## 2016-10-25 DIAGNOSIS — G4733 Obstructive sleep apnea (adult) (pediatric): Secondary | ICD-10-CM | POA: Diagnosis not present

## 2016-10-25 DIAGNOSIS — Z6841 Body Mass Index (BMI) 40.0 and over, adult: Secondary | ICD-10-CM | POA: Diagnosis not present

## 2016-11-05 DIAGNOSIS — H01003 Unspecified blepharitis right eye, unspecified eyelid: Secondary | ICD-10-CM | POA: Diagnosis not present

## 2016-11-05 DIAGNOSIS — H01006 Unspecified blepharitis left eye, unspecified eyelid: Secondary | ICD-10-CM | POA: Diagnosis not present

## 2016-11-05 DIAGNOSIS — E119 Type 2 diabetes mellitus without complications: Secondary | ICD-10-CM | POA: Diagnosis not present

## 2016-11-05 DIAGNOSIS — H40003 Preglaucoma, unspecified, bilateral: Secondary | ICD-10-CM | POA: Diagnosis not present

## 2016-11-05 LAB — HM DIABETES EYE EXAM

## 2016-11-06 DIAGNOSIS — G4733 Obstructive sleep apnea (adult) (pediatric): Secondary | ICD-10-CM | POA: Diagnosis not present

## 2016-11-09 DIAGNOSIS — Z888 Allergy status to other drugs, medicaments and biological substances status: Secondary | ICD-10-CM | POA: Diagnosis not present

## 2016-11-09 DIAGNOSIS — R911 Solitary pulmonary nodule: Secondary | ICD-10-CM | POA: Diagnosis not present

## 2016-11-13 ENCOUNTER — Encounter: Payer: Self-pay | Admitting: Family Medicine

## 2016-11-21 ENCOUNTER — Ambulatory Visit (INDEPENDENT_AMBULATORY_CARE_PROVIDER_SITE_OTHER): Payer: Medicare Other | Admitting: Family Medicine

## 2016-11-21 ENCOUNTER — Telehealth: Payer: Self-pay

## 2016-11-21 VITALS — BP 117/37 | HR 53 | Ht 61.0 in | Wt 381.0 lb

## 2016-11-21 DIAGNOSIS — R0989 Other specified symptoms and signs involving the circulatory and respiratory systems: Secondary | ICD-10-CM | POA: Diagnosis not present

## 2016-11-21 NOTE — Progress Notes (Signed)
Subjective:    CC: low diastolic BP  HPI:  66 year female comes in today for low diastolic blood pressure. She was seen by her dentist. Blood pressure there was 171/55. He did not want to perform a filling or sitting because of it. He was concerned about using epinephrine on her.We have been monitoring her low diastolic pressure. When you go back and look at her pressures her diastolics typically run in the 60s until about 6 months ago in November when it started to drop into the 50s. In fact recently about a month ago actually had her hold her Lasix for couple days and then come in and recheck her pressure to see if it made a difference. That day her diastolic was in the 16X really didn't make a big impact. I okayed her restart her Lasix every other day. Check she just seen cardiology in December and in March for some palpitations that she was having. She is also wearing her CPAP for her objective sleep apnea but having some difficulty with that. She is working with her pulmonologist on this. She actually feels fine today. She denies feeling lightheaded dizzy or weak. She did not take her Lasix today.  Past medical history, Surgical history, Family history not pertinant except as noted below, Social history, Allergies, and medications have been entered into the medical record, reviewed, and corrections made.   Review of Systems: No fevers, chills, night sweats, weight loss, chest pain, or shortness of breath.   Objective:    General: Well Developed, well nourished, and in no acute distress.  Neuro: Alert and oriented x3, extra-ocular muscles intact, sensation grossly intact.  HEENT: Normocephalic, atraumatic  Skin: Warm and dry, no rashes. Cardiac: Regular rate and rhythm, no murmurs rubs or gallops, no lower extremity edema.  Respiratory: Clear to auscultation bilaterally. Not using accessory muscles, speaking in full sentences.   Impression and Recommendations:    Wide pulse pressure-we  had a discussion today about the long-term consequences of this and increased risk of cardiac and renal disease. I'm also concerned that she has some significant stiffening of her blood vessels. I'm also concerned that she's not able to get her dental work because of this. I'm going to get her back in with her cardiologist to take a look at things and make some recommendations. May be some medication regimen changes could be helpful. Could consider switching lisinopril to losartan. She is asymptomatic today.

## 2016-11-21 NOTE — Telephone Encounter (Signed)
Natalie Wright is coming in today due to low diastolic pressure. She was seen by the dentist, Riley Lam, DDS, her blood pressure was 171/55. He did not want to preform filing and a fitting. She states he was concerned about the epinephrine.

## 2016-11-23 ENCOUNTER — Encounter: Payer: Self-pay | Admitting: Family Medicine

## 2016-11-28 DIAGNOSIS — R911 Solitary pulmonary nodule: Secondary | ICD-10-CM | POA: Diagnosis not present

## 2016-11-28 DIAGNOSIS — I10 Essential (primary) hypertension: Secondary | ICD-10-CM | POA: Diagnosis not present

## 2016-11-28 DIAGNOSIS — Z6841 Body Mass Index (BMI) 40.0 and over, adult: Secondary | ICD-10-CM | POA: Diagnosis not present

## 2016-11-28 DIAGNOSIS — G4733 Obstructive sleep apnea (adult) (pediatric): Secondary | ICD-10-CM | POA: Diagnosis not present

## 2016-11-28 DIAGNOSIS — Z9989 Dependence on other enabling machines and devices: Secondary | ICD-10-CM | POA: Diagnosis not present

## 2016-11-28 DIAGNOSIS — G4719 Other hypersomnia: Secondary | ICD-10-CM | POA: Diagnosis not present

## 2016-12-05 DIAGNOSIS — I491 Atrial premature depolarization: Secondary | ICD-10-CM | POA: Diagnosis not present

## 2016-12-05 DIAGNOSIS — I493 Ventricular premature depolarization: Secondary | ICD-10-CM | POA: Diagnosis not present

## 2016-12-05 DIAGNOSIS — I471 Supraventricular tachycardia: Secondary | ICD-10-CM | POA: Diagnosis not present

## 2016-12-05 DIAGNOSIS — I10 Essential (primary) hypertension: Secondary | ICD-10-CM | POA: Diagnosis not present

## 2016-12-24 ENCOUNTER — Ambulatory Visit: Payer: Medicare Other | Admitting: Family Medicine

## 2017-01-04 ENCOUNTER — Encounter: Payer: Self-pay | Admitting: Family Medicine

## 2017-01-04 ENCOUNTER — Ambulatory Visit (INDEPENDENT_AMBULATORY_CARE_PROVIDER_SITE_OTHER): Payer: Medicare Other | Admitting: Family Medicine

## 2017-01-04 VITALS — BP 150/65 | HR 98 | Resp 20 | Wt 372.6 lb

## 2017-01-04 DIAGNOSIS — Z794 Long term (current) use of insulin: Secondary | ICD-10-CM

## 2017-01-04 DIAGNOSIS — Z124 Encounter for screening for malignant neoplasm of cervix: Secondary | ICD-10-CM | POA: Diagnosis not present

## 2017-01-04 DIAGNOSIS — I700 Atherosclerosis of aorta: Secondary | ICD-10-CM | POA: Diagnosis not present

## 2017-01-04 DIAGNOSIS — G4733 Obstructive sleep apnea (adult) (pediatric): Secondary | ICD-10-CM | POA: Diagnosis not present

## 2017-01-04 DIAGNOSIS — E1165 Type 2 diabetes mellitus with hyperglycemia: Secondary | ICD-10-CM

## 2017-01-04 DIAGNOSIS — Z23 Encounter for immunization: Secondary | ICD-10-CM

## 2017-01-04 DIAGNOSIS — IMO0001 Reserved for inherently not codable concepts without codable children: Secondary | ICD-10-CM

## 2017-01-04 DIAGNOSIS — I10 Essential (primary) hypertension: Secondary | ICD-10-CM | POA: Diagnosis not present

## 2017-01-04 DIAGNOSIS — Z6841 Body Mass Index (BMI) 40.0 and over, adult: Secondary | ICD-10-CM | POA: Diagnosis not present

## 2017-01-04 DIAGNOSIS — G4719 Other hypersomnia: Secondary | ICD-10-CM | POA: Diagnosis not present

## 2017-01-04 DIAGNOSIS — Z9989 Dependence on other enabling machines and devices: Secondary | ICD-10-CM | POA: Diagnosis not present

## 2017-01-04 LAB — POCT GLYCOSYLATED HEMOGLOBIN (HGB A1C): Hemoglobin A1C: 7.2

## 2017-01-04 NOTE — Progress Notes (Signed)
Subjective:    CC: DM, HTN  HPI: Diabetes - no hypoglycemic events. No wounds or sores that are not healing well. No increased thirst or urination. Checking glucose at home. Taking medications as prescribed without any side effects.  Hypertension- Pt denies chest pain, SOB, dizziness, or heart palpitations.  Taking meds as directed w/o problems.  Denies medication side effects.  She did bring a log of her home blood pressures. Some of the systolics were fairly consistently in the 474Q with diastolics look great. Previously we were having problems with getting very low diastolic readings here in our office and who referred her back to cardiology. Initially they try decreasing her lisinopril but her pressures went up and so they increased it back. She still taking her 20 mg Lasix every other day. She's been on the increased Cardizem dose since December.  Aortic atherosclerosis- no CP. On ASA. INtolerant to statins.    Past medical history, Surgical history, Family history not pertinant except as noted below, Social history, Allergies, and medications have been entered into the medical record, reviewed, and corrections made.   Review of Systems: No fevers, chills, night sweats, weight loss, chest pain, or shortness of breath.   Objective:    General: Well Developed, well nourished, and in no acute distress.  Neuro: Alert and oriented x3, extra-ocular muscles intact, sensation grossly intact.  HEENT: Normocephalic, atraumatic  Skin: Warm and dry, no rashes. Cardiac: Regular rate and rhythm, no murmurs rubs or gallops, no lower extremity edema.  Respiratory: Clear to auscultation bilaterally. Not using accessory muscles, speaking in full sentences.   Impression and Recommendations:    DM- improved. A1C down to 7.2.  Continue work on [employer] weight loss and exercise. Follow-up in 3 months. Lab Results  Component Value Date   HGBA1C 7.2 01/04/2017   On ACE and ASA.     Due for  booster on Pneumovax 23   HTN - Repeat blood pressure still elevated today. I think she she go back on the Lasix 20 mg daily instead of every other day and just monitor for low diastolic pressure.  Aortic atherosclerosis- intolerant to statins. On ASA.   Needs Pap smear-we'll go ahead and place referral to OB/GYN. Her last was about 15 years ago. She describes having had some STD previously that they were never able to get rid of. Not clear what exactly that was.

## 2017-01-09 ENCOUNTER — Telehealth: Payer: Self-pay | Admitting: Family Medicine

## 2017-01-09 ENCOUNTER — Encounter: Payer: Self-pay | Admitting: Family Medicine

## 2017-01-09 NOTE — Telephone Encounter (Signed)
lvm informing pt that clearance was faxed.Natalie Wright

## 2017-01-09 NOTE — Telephone Encounter (Signed)
Call patient and let her know that we faxed over a letter for clearance for her blood pressure to her dentist.

## 2017-02-11 ENCOUNTER — Other Ambulatory Visit: Payer: Self-pay | Admitting: Family Medicine

## 2017-02-17 DIAGNOSIS — R29818 Other symptoms and signs involving the nervous system: Secondary | ICD-10-CM | POA: Diagnosis not present

## 2017-02-17 DIAGNOSIS — I10 Essential (primary) hypertension: Secondary | ICD-10-CM | POA: Diagnosis not present

## 2017-02-17 DIAGNOSIS — R531 Weakness: Secondary | ICD-10-CM | POA: Diagnosis not present

## 2017-02-17 DIAGNOSIS — Z7901 Long term (current) use of anticoagulants: Secondary | ICD-10-CM | POA: Diagnosis not present

## 2017-02-17 DIAGNOSIS — Z882 Allergy status to sulfonamides status: Secondary | ICD-10-CM | POA: Diagnosis not present

## 2017-02-17 DIAGNOSIS — G510 Bell's palsy: Secondary | ICD-10-CM | POA: Diagnosis not present

## 2017-02-17 DIAGNOSIS — R29810 Facial weakness: Secondary | ICD-10-CM | POA: Diagnosis not present

## 2017-02-17 DIAGNOSIS — Z888 Allergy status to other drugs, medicaments and biological substances status: Secondary | ICD-10-CM | POA: Diagnosis not present

## 2017-02-17 DIAGNOSIS — E119 Type 2 diabetes mellitus without complications: Secondary | ICD-10-CM | POA: Diagnosis not present

## 2017-02-17 DIAGNOSIS — Z79899 Other long term (current) drug therapy: Secondary | ICD-10-CM | POA: Diagnosis not present

## 2017-02-17 DIAGNOSIS — Z794 Long term (current) use of insulin: Secondary | ICD-10-CM | POA: Diagnosis not present

## 2017-02-17 DIAGNOSIS — Z87891 Personal history of nicotine dependence: Secondary | ICD-10-CM | POA: Diagnosis not present

## 2017-02-17 DIAGNOSIS — Z8673 Personal history of transient ischemic attack (TIA), and cerebral infarction without residual deficits: Secondary | ICD-10-CM | POA: Diagnosis not present

## 2017-02-17 DIAGNOSIS — R110 Nausea: Secondary | ICD-10-CM | POA: Diagnosis not present

## 2017-02-17 DIAGNOSIS — Z881 Allergy status to other antibiotic agents status: Secondary | ICD-10-CM | POA: Diagnosis not present

## 2017-02-17 DIAGNOSIS — Z7982 Long term (current) use of aspirin: Secondary | ICD-10-CM | POA: Diagnosis not present

## 2017-03-06 ENCOUNTER — Other Ambulatory Visit: Payer: Self-pay | Admitting: Family Medicine

## 2017-03-14 ENCOUNTER — Encounter: Payer: Medicare Other | Admitting: Obstetrics & Gynecology

## 2017-03-19 ENCOUNTER — Other Ambulatory Visit: Payer: Self-pay | Admitting: Family Medicine

## 2017-03-19 ENCOUNTER — Telehealth: Payer: Self-pay | Admitting: *Deleted

## 2017-03-19 ENCOUNTER — Ambulatory Visit (INDEPENDENT_AMBULATORY_CARE_PROVIDER_SITE_OTHER): Payer: Medicare Other | Admitting: Family Medicine

## 2017-03-19 ENCOUNTER — Encounter: Payer: Self-pay | Admitting: Family Medicine

## 2017-03-19 VITALS — BP 118/38 | HR 64 | Ht 61.0 in | Wt 373.0 lb

## 2017-03-19 DIAGNOSIS — I10 Essential (primary) hypertension: Secondary | ICD-10-CM | POA: Diagnosis not present

## 2017-03-19 DIAGNOSIS — R0989 Other specified symptoms and signs involving the circulatory and respiratory systems: Secondary | ICD-10-CM | POA: Diagnosis not present

## 2017-03-19 DIAGNOSIS — R600 Localized edema: Secondary | ICD-10-CM

## 2017-03-19 MED ORDER — CHLORTHALIDONE 25 MG PO TABS: 25.0000 mg | ORAL_TABLET | Freq: Every day | ORAL | 1 refills | Status: DC

## 2017-03-19 NOTE — Patient Instructions (Signed)
Hold the lasix and try chlorthalidone for 2 weeks. Call with BP at that time or sooner if having problem retaining fluid.

## 2017-03-19 NOTE — Progress Notes (Signed)
Subjective:    Patient ID: Natalie Wright, female    DOB: 06/23/1951, 66 y.o.   MRN: [id number]  HPI 66 year old female comes in today because she is having low diastolic blood pressures again.It started again this morning. She does not feel lightheaded or dizzy or weak.  Previously we were having problems with getting very low diastolic readings here in our office and who referred her back to cardiology. Initially they try decreasing her lisinopril but her pressures went up and so they increased it back. She still taking her 20 mg Lasix every other day. She's been on the increased Cardizem dose since December. Her blood pressure seemed to regulate after the adjustments but more recently the diastolic pressures have been dropping again. He does admit she hasn't been drinking this much water of the last week to week and a half. She usually gets about 45-50 ounces in a day but lately it has been less than that. Most week she takes her Lasix anywhere between 3-4 times per week so on average every other day. She has finally ordered a custom pair of compression stockings. She says when she does wear stockings it does make a big difference in her swelling.  Brought in home log:  In July her blood pressures ranged from 409-81 systolic with the diastolic ranging in the 19-14 range. In early August BP 120-160/60-70s In late August if he started to elevate slightly.SBP 170-180s/50-60s/    Review of Systems  BP (!) 118/38   Pulse 64   Ht 5\' 1"  (1.549 m)   Wt (!) 373 lb (169.2 kg)   SpO2 98%   BMI 70.48 kg/m     Allergies  Allergen Reactions  . Sulfa Antibiotics     Other reaction(s): Agitation  . Cephalexin   . Lipitor [Atorvastatin Calcium] Other (See Comments)    myalgias  . Pioglitazone     REACTION: ulcers in the mouth  . Pravastatin Other (See Comments)    Chest pain.     Past Medical History:  Diagnosis Date  . Allergy    to cats and dogs  . Diabetes mellitus   . Hiatal hernia     and delayed gastric emptying/ sees [facility] for recurrent diarrhea  . Hypertension   . OSA (obstructive sleep apnea)    CPAP 75mmhg  . Pedal edema   . Spastic bladder    urology in [place]  . TIA (transient ischemic attack)    hx of- sees Dr Elwin Mocha (Neurology)    No past surgical history on file.  Social History   Social History  . Marital status: Married    Spouse name: N/A  . Number of children: 2  . Years of education: Masters   Occupational History  . Advertising account planner   Social History Main Topics  . Smoking status: Former Research scientist (life sciences)  . Smokeless tobacco: Never Used  . Alcohol use Yes  . Drug use: Yes  . Sexual activity: Not on file   Other Topics Concern  . Not on file   Social History Narrative   Married to [place].  Foster parent    Family History  Problem Relation Age of Onset  . Diabetes Mother   . Hypertension Mother   . Glaucoma Mother   . Cancer Mother        bone cancer  . Alcohol abuse Father   . Emphysema Father   . Other Father        CHF  .  Cancer Sister        ? utereine ca    Outpatient Encounter Prescriptions as of 03/19/2017  Medication Sig  . acetaminophen (TYLENOL) 650 MG CR tablet Take 650 mg by mouth every 8 (eight) hours as needed.    . AMBULATORY NON FORMULARY MEDICATION Medication Name: please service CPAP machine or provide new CPAP machine if needed. Set on auto Pap for 1 month and then do a download to see if her pressure needs to be readjusted. Dx code: G47.30 (sleep apnea) FAX: [phone]; PHONE: [phone] [place]  . AMBULATORY NON FORMULARY MEDICATION Medication Name: custom fit knee high compression stocking with 15-20 mmHg pressure. Open toe preferred.  . AMBULATORY NON FORMULARY MEDICATION Medication Name: Please service her CPAP machine.  Humidifier not working and too much suction on the mask.  FAX: [phone]; PHONE: [phone] [place]  . Armodafinil 250 MG tablet Take by mouth.  Marland Kitchen aspirin 325 MG  tablet Take 325 mg by mouth daily.  . cetirizine (ZYRTEC) 10 MG tablet Take 10 mg by mouth daily.    . chlorthalidone (HYGROTON) 25 MG tablet Take 1 tablet (25 mg total) by mouth daily.  . clopidogrel (PLAVIX) 75 MG tablet TAKE 1 TABLET DAILY  . clotrimazole-betamethasone (LOTRISONE) cream Apply topically 2 (two) times daily. (Patient taking differently: Apply topically as needed. )  . diltiazem (CARDIZEM CD) 360 MG 24 hr capsule Take 360 mg by mouth.  . empagliflozin (JARDIANCE) 25 MG TABS tablet Take 25 mg by mouth daily.  . Ferrous Sulfate Dried (FEOSOL) 200 (65 FE) MG TABS Take 1 tablet by mouth daily.    . furosemide (LASIX) 20 MG tablet TAKE 1 TABLET DAILY AS NEEDED  . insulin glargine (LANTUS) 100 UNIT/ML injection Inject 0.3 mLs (30 Units total) into the skin daily.  . Insulin Pen Needle (NOVOFINE) 30G X 8 MM MISC For use each time injection for insulin  . lactase (LACTAID) 3000 UNITS tablet Take 3,000 Units by mouth as needed.   . latanoprost (XALATAN) 0.005 % ophthalmic solution 1 drop.  Marland Kitchen lisinopril (PRINIVIL,ZESTRIL) 40 MG tablet Take 40 mg by mouth.  . meclizine (ANTIVERT) 25 MG tablet TAKE 1 TABLET THREE TIMES A DAY AS NEEDED FOR DIZZINESS OR NAUSEA (Patient taking differently: as needed. TAKE 1 TABLET THREE TIMES A DAY AS NEEDED FOR DIZZINESS OR NAUSEA)  . metFORMIN (GLUCOPHAGE) 1000 MG tablet TAKE 1 TABLET TWICE A DAY WITH MEALS  . Omega-3 Fatty Acids (FISH OIL) 1200 MG CAPS Take 1 capsule by mouth daily.   No facility-administered encounter medications on file as of 03/19/2017.          Objective:   Physical Exam  Constitutional: She is oriented to person, place, and time. She appears well-developed and well-nourished.  HENT:  Head: Normocephalic and atraumatic.  Cardiovascular: Normal rate, regular rhythm and normal heart sounds.   Pulmonary/Chest: Effort normal and breath sounds normal.  Musculoskeletal:  1+ pitting edema of the feet bilaterally  Neurological: She  is alert and oriented to person, place, and time.  Skin: Skin is warm and dry.  Psychiatric: She has a normal mood and affect. Her behavior is normal.        Assessment & Plan:  Low diastolic BP - Renal hold her Lasix and put her on daily chlorthalidone. I am concerned about her low diastolic but also the swinging in her blood pressures at home. If we can get her on a more consistent diuretic that maybe is just a little less  power full it may actually be more effective. She will try this for 2 weeks and then give [place] a call back. I want her to monitor her weight and swelling daily. If she feels like it's getting worse and please call me back.  Chronic works from [employer] her custom fit pair of compression stockings which do seem to help control her swelling.

## 2017-03-19 NOTE — Telephone Encounter (Signed)
Pt called stating that her dystolic bp is in the low 40-50's asked if she feels lightheaded,dizzy,or weak she stated that she does not. She stated that she has not made any changes to her medications. She takes Cardizem 360 mg, and Lisinopril 40 mg daily and the lasix 3x a wk. I advised her that she will need to be seen and made an appt for her to come in today.Audelia Hives [place]

## 2017-03-28 ENCOUNTER — Encounter: Payer: Self-pay | Admitting: Obstetrics & Gynecology

## 2017-03-28 ENCOUNTER — Ambulatory Visit (INDEPENDENT_AMBULATORY_CARE_PROVIDER_SITE_OTHER): Payer: Medicare Other | Admitting: Obstetrics & Gynecology

## 2017-03-28 ENCOUNTER — Other Ambulatory Visit (HOSPITAL_COMMUNITY)
Admission: RE | Admit: 2017-03-28 | Discharge: 2017-03-28 | Disposition: A | Discharge status: Home / Self Care | Payer: Medicare Other | Source: Ambulatory Visit | Attending: Obstetrics & Gynecology | Admitting: Obstetrics & Gynecology

## 2017-03-28 VITALS — BP 122/82 | Ht 60.0 in | Wt 360.0 lb

## 2017-03-28 DIAGNOSIS — Z01419 Encounter for gynecological examination (general) (routine) without abnormal findings: Secondary | ICD-10-CM | POA: Insufficient documentation

## 2017-03-28 DIAGNOSIS — Z809 Family history of malignant neoplasm, unspecified: Secondary | ICD-10-CM

## 2017-03-28 DIAGNOSIS — N952 Postmenopausal atrophic vaginitis: Secondary | ICD-10-CM | POA: Diagnosis not present

## 2017-03-28 DIAGNOSIS — Z124 Encounter for screening for malignant neoplasm of cervix: Secondary | ICD-10-CM | POA: Diagnosis not present

## 2017-03-28 DIAGNOSIS — Z8041 Family history of malignant neoplasm of ovary: Secondary | ICD-10-CM | POA: Insufficient documentation

## 2017-03-28 NOTE — Progress Notes (Signed)
Last pap done in March of 2016 with [facility] and results were normal. Last Mammogram done in Novemebr of 2017.

## 2017-04-02 DIAGNOSIS — Z8041 Family history of malignant neoplasm of ovary: Secondary | ICD-10-CM | POA: Insufficient documentation

## 2017-04-02 LAB — CYTOLOGY - PAP
Bacterial vaginitis: NEGATIVE
Candida vaginitis: POSITIVE — AB
Diagnosis: NEGATIVE
HPV (WINDOPATH): NOT DETECTED
Trichomonas: NEGATIVE

## 2017-04-02 NOTE — Progress Notes (Signed)
   Subjective:    Patient ID: Natalie Wright, female    DOB: March 12, 1951, 66 y.o.   MRN: [id number]  HPI  Pt presents for evaluation for pap smear as well as gyn evaluation.  Pt's sister died of ovarian cancer.  Genetic screening not done as far as patient knows.  Pt was told she neede one last pap smear.  She stopped getting pap smears due to a miscommunication with her providers.  She believes she has not had one for many years and herefor has not had proper screening in orer to stop at 24.  No PMB.  Pt has some burning in vagina.    Review of Systems  Constitutional: Negative.   Cardiovascular: Negative.   Gastrointestinal: Negative.   Genitourinary: Positive for vaginal pain.  Psychiatric/Behavioral: Negative.        Objective:   Physical Exam  Constitutional: She is oriented to person, place, and time. She appears well-developed and well-nourished. No distress.  HENT:  Head: Normocephalic and atraumatic.  Eyes: Conjunctivae are normal.  Pulmonary/Chest: Effort normal.  Abdominal: Soft. Bowel sounds are normal. She exhibits no distension and no mass. There is no tenderness. There is no rebound and no guarding.  Genitourinary:  Genitourinary Comments: atrophic vagina Exam limited by habitus No other abnormality other than above.   Musculoskeletal: She exhibits no edema.  Neurological: She is alert and oriented to person, place, and time.  Skin: Skin is warm and dry.  Psychiatric: She has a normal mood and affect.  Vitals reviewed.  Vitals:   03/28/17 1012  BP: 122/82  Weight: (!) 360 lb (163.3 kg)  Height: 5' (1.524 m)    Assessment & Plan:  66 yo female with lack of proper cervial cancer screening, ovaran cancer in first degre relative, and atrophic vaginitis.  1.  Pap with co testing--If negative then can forgo future screenings 2.  My risk panel drawn for heriditary cancers 3.  Discussed vaginal estrogen.  Pt not ready for Rx at this time. 4.  Colon screening,  mammogram, and bone surveillance by PCP.

## 2017-04-03 ENCOUNTER — Encounter: Payer: Self-pay | Admitting: Family Medicine

## 2017-04-04 ENCOUNTER — Telehealth: Payer: Self-pay | Admitting: *Deleted

## 2017-04-04 NOTE — Telephone Encounter (Signed)
Pt notified of positive yeast.  She states that she does have a supply of Diflucan on hand as she gets yeast all the time.  She also mentioned that her insurance would not cover [facility] but she is going to call them back about the discounted price and whether she wants them to go ahead  and run the test.

## 2017-04-04 NOTE — Telephone Encounter (Signed)
-----   Message from Guss Bunde, MD sent at 04/02/2017  8:23 PM EDT ----- RX with diflucan per protocol

## 2017-04-08 ENCOUNTER — Ambulatory Visit: Payer: Medicare Other | Admitting: Family Medicine

## 2017-04-12 ENCOUNTER — Ambulatory Visit (INDEPENDENT_AMBULATORY_CARE_PROVIDER_SITE_OTHER): Payer: Medicare Other | Admitting: Family Medicine

## 2017-04-12 ENCOUNTER — Encounter: Payer: Self-pay | Admitting: Family Medicine

## 2017-04-12 ENCOUNTER — Encounter: Payer: Self-pay | Admitting: Obstetrics & Gynecology

## 2017-04-12 VITALS — BP 117/43 | HR 63 | Ht 60.0 in | Wt 350.0 lb

## 2017-04-12 DIAGNOSIS — M170 Bilateral primary osteoarthritis of knee: Secondary | ICD-10-CM | POA: Diagnosis not present

## 2017-04-12 DIAGNOSIS — Z6841 Body Mass Index (BMI) 40.0 and over, adult: Secondary | ICD-10-CM

## 2017-04-12 DIAGNOSIS — E1165 Type 2 diabetes mellitus with hyperglycemia: Secondary | ICD-10-CM

## 2017-04-12 DIAGNOSIS — I10 Essential (primary) hypertension: Secondary | ICD-10-CM

## 2017-04-12 DIAGNOSIS — Z23 Encounter for immunization: Secondary | ICD-10-CM | POA: Diagnosis not present

## 2017-04-12 DIAGNOSIS — R600 Localized edema: Secondary | ICD-10-CM | POA: Diagnosis not present

## 2017-04-12 DIAGNOSIS — R0989 Other specified symptoms and signs involving the circulatory and respiratory systems: Secondary | ICD-10-CM | POA: Diagnosis not present

## 2017-04-12 DIAGNOSIS — IMO0001 Reserved for inherently not codable concepts without codable children: Secondary | ICD-10-CM

## 2017-04-12 LAB — POCT GLYCOSYLATED HEMOGLOBIN (HGB A1C): Hemoglobin A1C: 8.1

## 2017-04-12 LAB — POCT UA - MICROALBUMIN
CREATININE, POC: 100 mg/dL
MICROALBUMIN (UR) POC: 10 mg/L

## 2017-04-12 NOTE — Patient Instructions (Addendum)
Diabetes Mellitus and Food It is important for you to manage your blood sugar (glucose) level. Your blood glucose level can be greatly affected by what you eat. Eating healthier foods in the appropriate amounts throughout the day at about the same time each day will help you control your blood glucose level. It can also help slow or prevent worsening of your diabetes mellitus. Healthy eating may even help you improve the level of your blood pressure and reach or maintain a healthy weight. General recommendations for healthful eating and cooking habits include:  Eating meals and snacks regularly. Avoid going long periods of time without eating to lose weight.  Eating a diet that consists mainly of plant-based foods, such as fruits, vegetables, nuts, legumes, and whole grains.  Using low-heat cooking methods, such as baking, instead of high-heat cooking methods, such as deep frying.  Work with your dietitian to make sure you understand how to use the Nutrition Facts information on food labels. How can food affect me? Carbohydrates Carbohydrates affect your blood glucose level more than any other type of food. Your dietitian will help you determine how many carbohydrates to eat at each meal and teach you how to count carbohydrates. Counting carbohydrates is important to keep your blood glucose at a healthy level, especially if you are using insulin or taking certain medicines for diabetes mellitus. Alcohol Alcohol can cause sudden decreases in blood glucose (hypoglycemia), especially if you use insulin or take certain medicines for diabetes mellitus. Hypoglycemia can be a life-threatening condition. Symptoms of hypoglycemia (sleepiness, dizziness, and disorientation) are similar to symptoms of having too much alcohol. If your health care provider has given you approval to drink alcohol, do so in moderation and use the following guidelines:  Women should not have more than one drink per day, and men  should not have more than two drinks per day. One drink is equal to: ? 12 oz of beer. ? 5 oz of wine. ? 1 oz of hard liquor.  Do not drink on an empty stomach.  Keep yourself hydrated. Have water, diet soda, or unsweetened iced tea.  Regular soda, juice, and other mixers might contain a lot of carbohydrates and should be counted.  What foods are not recommended? As you make food choices, it is important to remember that all foods are not the same. Some foods have fewer nutrients per serving than other foods, even though they might have the same number of calories or carbohydrates. It is difficult to get your body what it needs when you eat foods with fewer nutrients. Examples of foods that you should avoid that are high in calories and carbohydrates but low in nutrients include:  Trans fats (most processed foods list trans fats on the Nutrition Facts label).  Regular soda.  Juice.  Candy.  Sweets, such as cake, pie, doughnuts, and cookies.  Fried foods.  What foods can I eat? Eat nutrient-rich foods, which will nourish your body and keep you healthy. The food you should eat also will depend on several factors, including:  The calories you need.  The medicines you take.  Your weight.  Your blood glucose level.  Your blood pressure level.  Your cholesterol level.  You should eat a variety of foods, including:  Protein. ? Lean cuts of meat. ? Proteins low in saturated fats, such as fish, egg whites, and beans. Avoid processed meats.  Fruits and vegetables. ? Fruits and vegetables that may help control blood glucose levels, such as apples,   mangoes, and yams.  Dairy products. ? Choose fat-free or low-fat dairy products, such as milk, yogurt, and cheese.  Grains, bread, pasta, and rice. ? Choose whole grain products, such as multigrain bread, whole oats, and brown rice. These foods may help control blood pressure.  Fats. ? Foods containing healthful fats, such as  nuts, avocado, olive oil, canola oil, and fish.  Does everyone with diabetes mellitus have the same meal plan? Because every person with diabetes mellitus is different, there is not one meal plan that works for everyone. It is very important that you meet with a dietitian who will help you create a meal plan that is just right for you. This information is not intended to replace advice given to you by your health care provider. Make sure you discuss any questions you have with your health care provider. Document Released: 03/15/2005 Document Revised: 11/24/2015 Document Reviewed: 05/15/2013 Elsevier Interactive Patient Education  2017 [employer]vier Inc.  

## 2017-04-12 NOTE — Progress Notes (Signed)
Subjective:    CC: CM, HTN  HPI:  Diabetes - no hypoglycemic events. No wounds or sores that are not healing well. No increased thirst or urination. Checking glucose at home. Taking medications as prescribed without any side effects.  Hypertension- Pt denies chest pain, SOB, dizziness, or heart palpitations.  Taking meds as directed w/o problems.  Denies medication side effects.  WE d/c'd her lasix and changed Her to chlorthalidone. Overall she likes it better. She doesn't have nearly the frequency but says that when she does go she does seem to go a larger amount which she is happy with. But she does feel like it makes her a little lightheaded at times. But she's also been trying to consistently keep her fluid intake at 40-60 ounces daily.  She has retired since I last saw her. She is just having hard time getting motivated at home and trying to figure out a new routine for herself. She's not currently exercising but wants to know if it will be okay to start doing stationary bike again and how much would be safe with her significant OA of both knees. In fact she is a candidate for knee replacement. She does report that her extremity swelling seems to be better controlled when she is wearing her compression stockings. He also seems to help the discomfort and pain.  BMI 68-she has actually dropped about 31 pounds since she was here in May.   Past medical history, Surgical history, Family history not pertinant except as noted below, Social history, Allergies, and medications have been entered into the medical record, reviewed, and corrections made.   Review of Systems: No fevers, chills, night sweats, weight loss, chest pain, or shortness of breath.   Objective:    General: Well Developed, well nourished, and in no acute distress.  Neuro: Alert and oriented x3, extra-ocular muscles intact, sensation grossly intact.  HEENT: Normocephalic, atraumatic  Skin: Warm and dry, no rashes. Cardiac:  Regular rate and rhythm, no murmurs rubs or gallops, no lower extremity edema.  Respiratory: Clear to auscultation bilaterally. Not using accessory muscles, speaking in full sentences. MSK: has compression stocking on her legs.  Foot exam performed today.    Impression and Recommendations:    DM- Uncontrolled. We discussed options including adding another medication versus working on diet and exercise since she had a pretty decent job in her A1c. It's now 8.1, up from previous of 7.2. Continue current regimen. Follow up in  3-4 months.  On ACE.    HTN - blood pressure looks great today and home blood pressure log looks fantastic as well. Diastolics seem to be more consistent.  BMI 68 - she is doing fantastic and actually has lost 30 pounds in the last 5 months. One of her medications which she suspects is the new chlorthalidone has been making her just slightly nauseated. It's not every day and it comes and goes in waves that last maybe 15 minutes. This may be contributing.  Bilateral knee arthritis-discussed starting with 5 minutes on her stationary bike daily. This will start to get some exercise and strengthening going of her knees but should not necessarily exacerbate her symptoms. She can do this daily for a week or 2 weeks and then gradually build up by 1 minute per week depending on how she is tolerating it.  Venous stasis/chronic lower some edema-improving wearing her compression stockings.

## 2017-04-18 ENCOUNTER — Telehealth: Payer: Self-pay | Admitting: Family Medicine

## 2017-04-18 NOTE — Telephone Encounter (Signed)
lvm w/recommendations. Advised pt to rtn call if any questions.Natalie Wright [place]

## 2017-04-18 NOTE — Telephone Encounter (Signed)
Please call patient: She did not read her my chart response. Please see note below. RE: Visit Follow-Up Question   From Hali Marry, MD To [place] 04/04/2017 7:32 AM  Hi Addie, overall the BPs look good. I definitely feel that keeping up the water intake makes a difference in keeping the bottom number on your blood pressure stable. Congratulations on the weight loss as well. I noticed your doing fantastic on that as well. I will definitely refax the letter to your dentist. I printed and faxed it back in July so I'm not sure what happened. But I can just easily reprint and will send that over ASAP. Have a great week!   Beatrice Lecher, MD

## 2017-04-19 ENCOUNTER — Encounter: Payer: Self-pay | Admitting: Obstetrics & Gynecology

## 2017-04-19 ENCOUNTER — Telehealth: Payer: Self-pay | Admitting: *Deleted

## 2017-04-19 DIAGNOSIS — N952 Postmenopausal atrophic vaginitis: Secondary | ICD-10-CM

## 2017-04-19 MED ORDER — ESTRADIOL 0.1 MG/GM VA CREA: TOPICAL_CREAM | VAGINAL | 12 refills | Status: DC

## 2017-04-19 NOTE — Telephone Encounter (Signed)
Pt called requesting vaginal estrogen that she and Dr Gala Romney discussed at her last visit.  Spoke with Dr Gala Romney who recommended Estrace vaginal cream BIW.  This was [facility] to [facility].

## 2017-05-02 ENCOUNTER — Other Ambulatory Visit: Payer: Self-pay | Admitting: Family Medicine

## 2017-05-02 DIAGNOSIS — G4733 Obstructive sleep apnea (adult) (pediatric): Secondary | ICD-10-CM

## 2017-05-06 MED ORDER — CHLORTHALIDONE 25 MG PO TABS: 25.0000 mg | ORAL_TABLET | Freq: Every day | ORAL | 1 refills | Status: DC

## 2017-05-13 DIAGNOSIS — G4733 Obstructive sleep apnea (adult) (pediatric): Secondary | ICD-10-CM | POA: Diagnosis not present

## 2017-05-14 DIAGNOSIS — G4719 Other hypersomnia: Secondary | ICD-10-CM | POA: Diagnosis not present

## 2017-05-14 DIAGNOSIS — Z9989 Dependence on other enabling machines and devices: Secondary | ICD-10-CM | POA: Diagnosis not present

## 2017-05-14 DIAGNOSIS — G4733 Obstructive sleep apnea (adult) (pediatric): Secondary | ICD-10-CM | POA: Diagnosis not present

## 2017-05-14 DIAGNOSIS — I10 Essential (primary) hypertension: Secondary | ICD-10-CM | POA: Diagnosis not present

## 2017-05-20 DIAGNOSIS — H40003 Preglaucoma, unspecified, bilateral: Secondary | ICD-10-CM | POA: Diagnosis not present

## 2017-05-20 DIAGNOSIS — H01006 Unspecified blepharitis left eye, unspecified eyelid: Secondary | ICD-10-CM | POA: Diagnosis not present

## 2017-05-20 DIAGNOSIS — E119 Type 2 diabetes mellitus without complications: Secondary | ICD-10-CM | POA: Diagnosis not present

## 2017-05-20 DIAGNOSIS — H527 Unspecified disorder of refraction: Secondary | ICD-10-CM | POA: Diagnosis not present

## 2017-05-20 DIAGNOSIS — H25813 Combined forms of age-related cataract, bilateral: Secondary | ICD-10-CM | POA: Diagnosis not present

## 2017-05-20 DIAGNOSIS — H01003 Unspecified blepharitis right eye, unspecified eyelid: Secondary | ICD-10-CM | POA: Diagnosis not present

## 2017-05-20 DIAGNOSIS — H43813 Vitreous degeneration, bilateral: Secondary | ICD-10-CM | POA: Diagnosis not present

## 2017-05-20 LAB — HM DIABETES EYE EXAM

## 2017-06-03 ENCOUNTER — Other Ambulatory Visit: Payer: Self-pay | Admitting: Family Medicine

## 2017-06-14 ENCOUNTER — Encounter: Payer: Self-pay | Admitting: Family Medicine

## 2017-07-01 ENCOUNTER — Other Ambulatory Visit: Payer: Self-pay

## 2017-07-01 ENCOUNTER — Emergency Department (INDEPENDENT_AMBULATORY_CARE_PROVIDER_SITE_OTHER)
Admission: EM | Admit: 2017-07-01 | Discharge: 2017-07-01 | Disposition: A | Discharge status: Home / Self Care | Payer: Medicare Other | Source: Home / Self Care | Attending: Family Medicine | Admitting: Family Medicine

## 2017-07-01 DIAGNOSIS — B9789 Other viral agents as the cause of diseases classified elsewhere: Secondary | ICD-10-CM | POA: Diagnosis not present

## 2017-07-01 DIAGNOSIS — J069 Acute upper respiratory infection, unspecified: Secondary | ICD-10-CM | POA: Diagnosis not present

## 2017-07-01 MED ORDER — AMOXICILLIN 875 MG PO TABS: 875.0000 mg | ORAL_TABLET | Freq: Two times a day (BID) | ORAL | 0 refills | Status: DC

## 2017-07-01 NOTE — Discharge Instructions (Signed)
Take plain guaifenesin (1200mg  extended release tabs such as Mucinex) twice daily, with plenty of water, for cough and congestion.  May add Pseudoephedrine (30mg , one or two every 4 to 6 hours) for sinus congestion (if blood pressure is not affected). Get adequate rest.   May use Afrin nasal spray (or generic oxymetazoline) each morning for about 5 days and then discontinue.  Also recommend using saline nasal spray several times daily and saline nasal irrigation (AYR is a common brand).  Try warm salt water gargles for sore throat.  Stop all antihistamines for now, and other non-prescription cough/cold preparations. May take Delsym Cough Suppressant at bedtime for nighttime cough.

## 2017-07-01 NOTE — ED Triage Notes (Signed)
Started Friday with nasal congestion, runny nose, greenish/ brown mucous.  Headache over the eyes.

## 2017-07-01 NOTE — ED Provider Notes (Signed)
[name]    CSN: [id number] Arrival date & time: 07/01/17  0815     History   Chief Complaint Chief Complaint  Patient presents with  . Facial Pain    HPI Natalie Wright is a 66 y.o. female.   Patient complains of four day history of cold-like symptoms developing over several days, including sinus congestion, myalgias, headache, fatigue, facial pressure, and cough.  She has a history of seasonal rhinitis.   The history is provided by the patient.    Past Medical History:  Diagnosis Date  . Allergy    to cats and dogs  . Diabetes mellitus   . Hiatal hernia    and delayed gastric emptying/ sees [facility] for recurrent diarrhea  . Hypertension   . OSA (obstructive sleep apnea)    CPAP 64mmhg  . Pedal edema   . Spastic bladder    urology in [place]  . TIA (transient ischemic attack)    hx of- sees Dr Elwin Mocha (Neurology)    Patient Active Problem List   Diagnosis Date Noted  . Family history of ovarian cancer 04/02/2017  . Aortic atherosclerosis ([place]) 08/31/2016  . Venous stasis 08/10/2016  . Palpitations 07/16/2016  . 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 Diabetes type 2, uncontrolled ([place]) 08/14/2013  . 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 Cataract 04/18/2012  . Other and unspecified hyperlipidemia 02/13/2011  . Pulmonary nodules 09/22/2010  . LEG PAIN 09/12/2009  . Sleep apnea 04/13/2009  . GERD 03/14/2009  . Diabetes mellitus type 2, uncontrolled, without complications ([place]) 08/65/7846  . OBESITY, MORBID 12/10/2008  . Essential hypertension, benign 12/10/2008  . RAYNAUD'S DISEASE 12/10/2008  . OSTEOARTHRITIS 12/10/2008  . URINARY INCONTINENCE 12/10/2008  . TRANSIENT ISCHEMIC ATTACKS, HX OF 12/10/2008    Past Surgical History:  Procedure Laterality Date  . vaginal skin tag removal      OB History    Gravida Para Term Preterm AB Living   4 2 2   1      SAB TAB Ectopic Multiple Live Births   1                Home Medications    Prior to Admission medications   Medication Sig Start Date End Date Taking? Authorizing Provider  acetaminophen (TYLENOL) 650 MG CR tablet Take 650 mg by mouth every 8 (eight) hours as needed.      [provider]  AMBULATORY NON FORMULARY MEDICATION Medication Name: please service CPAP machine or provide new CPAP machine if needed. Set on auto Pap for 1 month and then do a download to see if her pressure needs to be readjusted. Dx code: G47.30 (sleep apnea) FAX: [phone]; PHONE: [phone] [facility] 06/08/16   Hali Marry, MD  AMBULATORY NON FORMULARY MEDICATION Medication Name: custom fit knee high compression stocking with 15-20 mmHg pressure. Open toe preferred. 08/10/16   Hali Marry, MD  AMBULATORY NON FORMULARY MEDICATION Medication Name: Please service her CPAP machine.  Humidifier not working and too much suction on the mask.  FAX: [phone]; PHONE: [phone] [facility] 08/10/16   Hali Marry, MD  amoxicillin (AMOXIL) 875 MG tablet Take 1 tablet (875 mg total) by mouth 2 (two) times daily. 07/01/17   Kandra Nicolas, MD  Armodafinil 250 MG tablet Take by mouth. 01/04/17   [provider]  aspirin 325 MG tablet Take 325 mg by mouth daily.    [provider]  cetirizine (ZYRTEC) 10  MG tablet Take 10 mg by mouth daily.      [provider]  chlorthalidone (HYGROTON) 25 MG tablet Take 1 tablet (25 mg total) daily by mouth. 05/06/17   Hali Marry, MD  clopidogrel (PLAVIX) 75 MG tablet TAKE 1 TABLET DAILY 02/12/17   Hali Marry, MD  diltiazem (CARDIZEM CD) 360 MG 24 hr capsule Take 360 mg by mouth. 09/07/16   [provider]  empagliflozin (JARDIANCE) 25 MG TABS tablet Take 25 mg by mouth daily. 10/17/16   Hali Marry, MD  estradiol (ESTRACE VAGINAL) 0.1 MG/GM vaginal cream Apply applicator vaginally Bi weekly 04/19/17   Guss Bunde, MD  Ferrous  Sulfate Dried (FEOSOL) 200 (65 FE) MG TABS Take 1 tablet by mouth daily.      [provider]  fluconazole (DIFLUCAN) 150 MG tablet TAKE 1 TABLET BY MOUTH ONCE 06/03/17   Hali Marry, MD  Insulin Pen Needle (NOVOFINE) 30G X 8 MM MISC For use each time injection for insulin 02/10/16   Hali Marry, MD  lactase (LACTAID) 3000 UNITS tablet Take 3,000 Units by mouth as needed.     [provider]  LANTUS 100 UNIT/ML injection INJECT 30 UNITS UNDER THE SKIN DAILY 06/03/17   Hali Marry, MD  latanoprost (XALATAN) 0.005 % ophthalmic solution 1 drop.    [provider]  lisinopril (PRINIVIL,ZESTRIL) 40 MG tablet Take 40 mg by mouth. 09/07/16   [provider]  meclizine (ANTIVERT) 25 MG tablet TAKE 1 TABLET THREE TIMES A DAY AS NEEDED FOR DIZZINESS OR NAUSEA Patient taking differently: as needed. TAKE 1 TABLET THREE TIMES A DAY AS NEEDED FOR DIZZINESS OR NAUSEA 02/10/16   Hali Marry, MD  metFORMIN (GLUCOPHAGE) 1000 MG tablet TAKE 1 TABLET TWICE A DAY WITH MEALS 03/07/17   Hali Marry, MD  Omega-3 Fatty Acids (FISH OIL) 1200 MG CAPS Take 1 capsule by mouth daily.    [provider]    Family History Family History  Problem Relation Age of Onset  . Diabetes Mother   . Hypertension Mother   . Glaucoma Mother   . Cancer Mother        bone cancer  . Alcohol abuse Father   . Emphysema Father   . Other Father        CHF  . Ovarian cancer Sister     Social History Social History   Tobacco Use  . Smoking status: Former Research scientist (life sciences)  . Smokeless tobacco: Never Used  Substance Use Topics  . Alcohol use: Yes  . Drug use: No     Allergies   Atorvastatin; Beclomethasone; Cefaclor; Pioglitazone; Pravastatin; Sulfa antibiotics; Cephalexin; Cephalexin; Lipitor [atorvastatin calcium]; and Statins   Review of Systems Review of Systems No sore throat + cough No pleuritic pain No wheezing + nasal congestion +  post-nasal drainage + sinus pain/pressure No itchy/red eyes No earache No hemoptysis No SOB No fever, + chills No nausea No vomiting No abdominal pain No diarrhea No urinary symptoms No skin rash No fatigue No myalgias + headache Used OTC meds without relief   Physical Exam Triage Vital Signs ED Triage Vitals  Enc Vitals Group     BP 07/01/17 0842 113/69     Pulse Rate 07/01/17 0842 60     Resp --      Temp 07/01/17 0842 98.4 F (36.9 C)     Temp Source 07/01/17 0842 Oral     SpO2 07/01/17 0842  97 %     Weight 07/01/17 0843 (!) 364 lb (165.1 kg)     Height 07/01/17 0843 5' (1.524 m)     Head Circumference --      Peak Flow --      Pain Score 07/01/17 0843 0     Pain Loc --      Pain Edu? --      Excl. in [place]? --    No data found.  Updated Vital Signs BP 113/69 (BP Location: Left Arm)   Pulse 60   Temp 98.4 F (36.9 C) (Oral)   Ht 5' (1.524 m)   Wt (!) 364 lb (165.1 kg)   SpO2 97%   BMI 71.09 kg/m   Visual Acuity Right Eye Distance:   Left Eye Distance:   Bilateral Distance:    Right Eye Near:   Left Eye Near:    Bilateral Near:     Physical Exam Nursing notes and Vital Signs reviewed. Appearance:  Patient appears stated age, and in no acute distress Eyes:  Pupils are equal, round, and reactive to light and accomodation.  Extraocular movement is intact.  Conjunctivae are not inflamed  Ears:  Canals normal.  Tympanic membranes normal.  Nose:  Mildly congested turbinates.  No sinus tenderness.   Pharynx:  Normal Neck:  Supple.  Enlarged posterior/lateral nodes are palpated bilaterally, tender to palpation on the left.   Lungs:  Clear to auscultation.  Breath sounds are equal.  Moving air well. Heart:  Regular rate and rhythm without murmurs, rubs, or gallops.  Abdomen:  Nontender without masses or hepatosplenomegaly.  Bowel sounds are present.  No CVA or flank tenderness.  Extremities:  No edema.  Skin:  No rash present.    UC Treatments / Results   Labs (all labs ordered are listed, but only abnormal results are displayed) Labs Reviewed - No data to display  EKG  EKG Interpretation None       Radiology No results found.  Procedures Procedures (including critical care time)  Medications Ordered in UC Medications - No data to display   Initial Impression / Assessment and Plan / UC Course  I have reviewed the triage vital signs and the nursing notes.  Pertinent labs & imaging results that were available during my care of the patient were reviewed by me and considered in my medical decision making (see chart for details).    ?Sinusitis Begin amoxicillin 875mg  BID (patient reports that she has had no adverse effects in the past). Take plain guaifenesin (1200mg  extended release tabs such as Mucinex) twice daily, with plenty of water, for cough and congestion.  May add Pseudoephedrine (30mg , one or two every 4 to 6 hours) for sinus congestion (if blood pressure is not affected). Get adequate rest.   May use Afrin nasal spray (or generic oxymetazoline) each morning for about 5 days and then discontinue.  Also recommend using saline nasal spray several times daily and saline nasal irrigation (AYR is a common brand).  Try warm salt water gargles for sore throat.  Stop all antihistamines for now, and other non-prescription cough/cold preparations. May take Delsym Cough Suppressant at bedtime for nighttime cough.  Followup with Family Doctor if not improved in about 10 days.    Final Clinical Impressions(s) / UC Diagnoses   Final diagnoses:  Viral URI with cough    ED Discharge Orders        Ordered    amoxicillin (AMOXIL) 875 MG tablet  2  times daily     07/01/17 0920          Kandra Nicolas, MD 07/02/17 [phone]

## 2017-07-09 ENCOUNTER — Other Ambulatory Visit: Payer: Self-pay | Admitting: Family Medicine

## 2017-07-16 ENCOUNTER — Ambulatory Visit: Payer: Medicare Other | Admitting: Family Medicine

## 2017-08-14 DIAGNOSIS — Z9989 Dependence on other enabling machines and devices: Secondary | ICD-10-CM | POA: Diagnosis not present

## 2017-08-14 DIAGNOSIS — Z6841 Body Mass Index (BMI) 40.0 and over, adult: Secondary | ICD-10-CM | POA: Diagnosis not present

## 2017-08-14 DIAGNOSIS — I10 Essential (primary) hypertension: Secondary | ICD-10-CM | POA: Diagnosis not present

## 2017-08-14 DIAGNOSIS — G4733 Obstructive sleep apnea (adult) (pediatric): Secondary | ICD-10-CM | POA: Diagnosis not present

## 2017-08-14 DIAGNOSIS — G4719 Other hypersomnia: Secondary | ICD-10-CM | POA: Diagnosis not present

## 2017-09-24 ENCOUNTER — Ambulatory Visit: Payer: Medicare Other | Admitting: Family Medicine

## 2017-09-24 ENCOUNTER — Other Ambulatory Visit: Payer: Self-pay | Admitting: *Deleted

## 2017-09-24 ENCOUNTER — Telehealth: Payer: Self-pay | Admitting: Family Medicine

## 2017-09-24 DIAGNOSIS — Z0189 Encounter for other specified special examinations: Secondary | ICD-10-CM

## 2017-09-24 MED ORDER — CLOPIDOGREL BISULFATE 75 MG PO TABS: 75.0000 mg | ORAL_TABLET | Freq: Every day | ORAL | 3 refills | Status: DC

## 2017-09-24 NOTE — Progress Notes (Deleted)
Subjective:    CC: DM, HTN  HPI:  Diabetes - no hypoglycemic events. No wounds or sores that are not healing well. No increased thirst or urination. Checking glucose at home. Taking medications as prescribed without any side effects.  Currently on Jardiance and Lantus.   Hypertension- Pt denies chest pain, SOB, dizziness, or heart palpitations.  Taking meds as directed w/o problems.  Denies medication side effects.      Past medical history, Surgical history, Family history not pertinant except as noted below, Social history, Allergies, and medications have been entered into the medical record, reviewed, and corrections made.   Review of Systems: No fevers, chills, night sweats, weight loss, chest pain, or shortness of breath.   Objective:    General: Well Developed, well nourished, and in no acute distress.  Neuro: Alert and oriented x3, extra-ocular muscles intact, sensation grossly intact.  HEENT: Normocephalic, atraumatic  Skin: Warm and dry, no rashes. Cardiac: Regular rate and rhythm, no murmurs rubs or gallops, no lower extremity edema.  Respiratory: Clear to auscultation bilaterally. Not using accessory muscles, speaking in full sentences.   Impression and Recommendations:    DM -   HTN -

## 2017-09-24 NOTE — Telephone Encounter (Signed)
Patient had appt. Today and called to cancel and reschedule. She stated that she is almost out of her Plavix. She asked if you could call in refill to pharmacy on file.

## 2017-09-24 NOTE — Progress Notes (Signed)
Rx sent.Marland KitchenMarland KitchenElouise Munroe, [place]

## 2017-09-27 ENCOUNTER — Ambulatory Visit (INDEPENDENT_AMBULATORY_CARE_PROVIDER_SITE_OTHER): Payer: Medicare Other | Admitting: Family Medicine

## 2017-09-27 ENCOUNTER — Other Ambulatory Visit: Payer: Self-pay | Admitting: Family Medicine

## 2017-09-27 ENCOUNTER — Encounter: Payer: Self-pay | Admitting: Family Medicine

## 2017-09-27 VITALS — BP 130/42 | HR 72 | Temp 98.2°F | Wt 370.5 lb

## 2017-09-27 DIAGNOSIS — R221 Localized swelling, mass and lump, neck: Secondary | ICD-10-CM

## 2017-09-27 DIAGNOSIS — H9313 Tinnitus, bilateral: Secondary | ICD-10-CM

## 2017-09-27 DIAGNOSIS — R002 Palpitations: Secondary | ICD-10-CM

## 2017-09-27 DIAGNOSIS — I10 Essential (primary) hypertension: Secondary | ICD-10-CM

## 2017-09-27 DIAGNOSIS — IMO0001 Reserved for inherently not codable concepts without codable children: Secondary | ICD-10-CM

## 2017-09-27 DIAGNOSIS — E1165 Type 2 diabetes mellitus with hyperglycemia: Secondary | ICD-10-CM | POA: Diagnosis not present

## 2017-09-27 DIAGNOSIS — Z6841 Body Mass Index (BMI) 40.0 and over, adult: Secondary | ICD-10-CM

## 2017-09-27 LAB — POCT GLYCOSYLATED HEMOGLOBIN (HGB A1C): Hemoglobin A1C: 7.8

## 2017-09-27 MED ORDER — DILTIAZEM HCL ER COATED BEADS 360 MG PO CP24: 360.0000 mg | ORAL_CAPSULE | Freq: Every day | ORAL | 0 refills | Status: DC

## 2017-09-27 MED ORDER — LISINOPRIL 40 MG PO TABS: 40.0000 mg | ORAL_TABLET | Freq: Every day | ORAL | 0 refills | Status: DC

## 2017-09-27 MED ORDER — CHLORTHALIDONE 25 MG PO TABS: 25.0000 mg | ORAL_TABLET | Freq: Every day | ORAL | 1 refills | Status: DC

## 2017-09-27 MED ORDER — DULAGLUTIDE 0.75 MG/0.5ML ~~LOC~~ SOAJ: 0.7500 mg | SUBCUTANEOUS | 2 refills | Status: DC

## 2017-09-27 MED ORDER — CLOPIDOGREL BISULFATE 75 MG PO TABS: 75.0000 mg | ORAL_TABLET | Freq: Every day | ORAL | 0 refills | Status: DC

## 2017-09-27 MED ORDER — FLUTICASONE PROPIONATE 50 MCG/ACT NA SUSP: 2.0000 | Freq: Every day | NASAL | 6 refills | Status: DC

## 2017-09-27 NOTE — Progress Notes (Signed)
Subjective:    CC: BP, DM  HPI:  Hypertension- Pt denies chest pain, SOB, dizziness, or heart palpitations.  Taking meds as directed w/o problems.  Denies medication side effects.    Diabetes - no hypoglycemic events. No wounds or sores that are not healing well. No increased thirst or urination. Checking glucose at home. Taking medications as prescribed without any side effects.  A1c was elevated to 8.1 back in October. She says when she does really well with her diet her sugars actually will drop low.   BMI 72 -  She has gained back about 6 lbs since last here.  She continues to have bilateral knee pain.    In Jan she had a URI that last about 3-1/2 weeks.  After the respiratory infection she says her ears have been having a buzzing noise in.  She is also been having some intermittent dizziness.  No ear pain or drainage.  It started right after the cold symptoms. She has been getting a pressure sensation at the back of the head as well as some tingling on her scalp.    She also complains of palpitations.  She has not had any issues with her palpitations for almost a year until Friday night.  She said she fell asleep and then suddenly woke up feeling startled and alert and felt her heart racing.  She was able to fall back asleep but then woke up several times experiencing the same symptoms.  That following Monday she actually called her pulmonologist, Dr. Damaris Schooner had them check her CPAP to make sure there were no problems.  Evidently the download was normal.  She denies any increase in caffeine intake, or OTC meds likd decongestants. Has one cup of coffee in the AM.  No energy drinks or bars.    She also noted a "swollen knot" on the left side of her neck towards her shoulder that was the size of a quarter. Felt it 2 weeks ago.  It seem to have resolved.   BP (!) 130/42 (BP Location: Left Arm, Patient Position: Sitting, Cuff Size: Large)   Pulse 72   Temp 98.2 F (36.8 C) (Oral)   Wt (!) 370  lb 8 oz (168.1 kg)   BMI 72.36 kg/m     Allergies  Allergen Reactions  . Atorvastatin Other (See Comments)    Patient stated muscle weakness Patient stated muscle weakness  . Beclomethasone Other (See Comments)    unknown unknown  . Cefaclor Hives  . Pioglitazone Other (See Comments)    Mouth sores Mouth sores Mouth sores unknown REACTION: ulcers in the mouth  . Pravastatin Other (See Comments)    Chest pain.  Chest pain.   . Sulfa Antibiotics Hives    Other reaction(s): Agitation  . Cephalexin   . Cephalexin Other (See Comments)    unknown  . Lipitor [Atorvastatin Calcium] Other (See Comments)    myalgias  . Statins Other (See Comments)    Muscle pain    Past Medical History:  Diagnosis Date  . Allergy    to cats and dogs  . Diabetes mellitus   . Hiatal hernia    and delayed gastric emptying/ sees [facility] for recurrent diarrhea  . Hypertension   . OSA (obstructive sleep apnea)    CPAP 49mmhg  . Pedal edema   . Spastic bladder    urology in [place]  . TIA (transient ischemic attack)    hx of- sees Dr Elwin Mocha (Neurology)  Past Surgical History:  Procedure Laterality Date  . vaginal skin tag removal      Social History   Socioeconomic History  . Marital status: Married    Spouse name: Not on file  . Number of children: 2  . Years of education: Masters  . Highest education level: Not on file  Occupational History  . Occupation: Diplomatic Services operational officer: [employer]  Social Needs  . Financial resource strain: Not on file  . Food insecurity:    Worry: Not on file    Inability: Not on file  . Transportation needs:    Medical: Not on file    Non-medical: Not on file  Tobacco Use  . Smoking status: Former Research scientist (life sciences)  . Smokeless tobacco: Never Used  Substance and Sexual Activity  . Alcohol use: Yes  . Drug use: No  . Sexual activity: Never  Lifestyle  . Physical activity:    Days per week: Not on file    Minutes per session: Not on file  .  Stress: Not on file  Relationships  . Social connections:    Talks on phone: Not on file    Gets together: Not on file    Attends religious service: Not on file    Active member of club or organization: Not on file    Attends meetings of clubs or organizations: Not on file    Relationship status: Not on file  . Intimate partner violence:    Fear of current or ex partner: Not on file    Emotionally abused: Not on file    Physically abused: Not on file    Forced sexual activity: Not on file  Other Topics Concern  . Not on file  Social History Narrative   Married to [place].  Foster parent    Family History  Problem Relation Age of Onset  . Diabetes Mother   . Hypertension Mother   . Glaucoma Mother   . Cancer Mother        bone cancer  . Alcohol abuse Father   . Emphysema Father   . Other Father        CHF  . Ovarian cancer Sister     Outpatient Encounter Medications as of 09/27/2017  Medication Sig  . acetaminophen (TYLENOL) 650 MG CR tablet Take 650 mg by mouth every 8 (eight) hours as needed.    . AMBULATORY NON FORMULARY MEDICATION Medication Name: please service CPAP machine or provide new CPAP machine if needed. Set on auto Pap for 1 month and then do a download to see if her pressure needs to be readjusted. Dx code: G47.30 (sleep apnea) FAX: [phone]; PHONE: [phone] [facility]  . AMBULATORY NON FORMULARY MEDICATION Medication Name: custom fit knee high compression stocking with 15-20 mmHg pressure. Open toe preferred.  . AMBULATORY NON FORMULARY MEDICATION Medication Name: Please service her CPAP machine.  Humidifier not working and too much suction on the mask.  FAX: [phone]; PHONE: [phone] [facility]  . Armodafinil 250 MG tablet Take by mouth.  Marland Kitchen aspirin 325 MG tablet Take 325 mg by mouth daily.  . cetirizine (ZYRTEC) 10 MG tablet Take 10 mg by mouth daily.    . chlorthalidone (HYGROTON) 25 MG tablet Take 1 tablet (25 mg total) by mouth daily.   . clopidogrel (PLAVIX) 75 MG tablet Take 1 tablet (75 mg total) by mouth daily.  Marland Kitchen diltiazem (CARDIZEM CD) 360 MG 24 hr capsule Take 1 capsule (360 mg total) by mouth daily.  Marland Kitchen  estradiol (ESTRACE VAGINAL) 0.1 MG/GM vaginal cream Apply applicator vaginally Bi weekly  . Ferrous Sulfate Dried (FEOSOL) 200 (65 FE) MG TABS Take 1 tablet by mouth daily.    . fluconazole (DIFLUCAN) 150 MG tablet TAKE 1 TABLET BY MOUTH ONCE  . Insulin Pen Needle (NOVOFINE) 30G X 8 MM MISC For use each time injection for insulin  . JARDIANCE 25 MG TABS tablet TAKE 1 TABLET DAILY  . lactase (LACTAID) 3000 UNITS tablet Take 3,000 Units by mouth as needed.   Marland Kitchen LANTUS 100 UNIT/ML injection INJECT 30 UNITS UNDER THE SKIN DAILY  . latanoprost (XALATAN) 0.005 % ophthalmic solution 1 drop.  Marland Kitchen lisinopril (PRINIVIL,ZESTRIL) 40 MG tablet Take 1 tablet (40 mg total) by mouth daily.  . metFORMIN (GLUCOPHAGE) 1000 MG tablet TAKE 1 TABLET TWICE A DAY WITH MEALS  . Omega-3 Fatty Acids (FISH OIL) 1200 MG CAPS Take 1 capsule by mouth daily.  . [DISCONTINUED] chlorthalidone (HYGROTON) 25 MG tablet Take 1 tablet (25 mg total) daily by mouth.  . [DISCONTINUED] clopidogrel (PLAVIX) 75 MG tablet Take 1 tablet (75 mg total) by mouth daily.  . [DISCONTINUED] diltiazem (CARDIZEM CD) 360 MG 24 hr capsule Take 360 mg by mouth.  . [DISCONTINUED] lisinopril (PRINIVIL,ZESTRIL) 40 MG tablet Take 40 mg by mouth.  . Dulaglutide (TRULICITY) 4.25 ZD/6.3OV SOPN Inject 0.75 mg into the skin every 7 (seven) days.  . fluticasone (FLONASE) 50 MCG/ACT nasal spray Place 2 sprays into both nostrils daily.   No facility-administered encounter medications on file as of 09/27/2017.         Objective:    General: Well Developed, well nourished, and in no acute distress.  Neuro: Alert and oriented x3, extra-ocular muscles intact, sensation grossly intact.  HEENT: Normocephalic, atraumatic, no palpable abnormalities on the neck.  TMs and canals are clear  bilaterally.  Skin: Warm and dry, no rashes. Cardiac: Regular rate and rhythm, no murmurs rubs or gallops, no lower extremity edema.  Respiratory: Clear to auscultation bilaterally. Not using accessory muscles, speaking in full sentences.   Impression and Recommendations:    HTN - Well controlled. Continue current regimen. Follow up in  6 months. Though diastolic is STILL low.  Make ure hydrating well. I did refill her cardiac meds temporarily until can get in with cards but reminded her to call them. She may be due for an appointment.    DM - A1C improved but not at goal.  Uncontrolled.  Will send in new rX for trulicity to add to Jardiance and Lantus.    BMI 72 - continue to work on healthy diet.    Palpitations - CPAP was working well. Conisder may be stress related. Unclear etiology at this time but is seems to be getting better.    If continues then recommend cardiac monitor.   Knot on neck - likely inflammed sebum gland that has resolved or a swollen LN that has resolved as well. Normal exam on neck.   Ear buzzing - ear exam is normal this and head pressure started after UTI. Recommend a trial of flonase.    45 min spent, > 50% spent addressing and counseling about 3 new problems and 2 old problems.

## 2017-09-30 ENCOUNTER — Encounter: Payer: Self-pay | Admitting: Family Medicine

## 2017-10-21 DIAGNOSIS — E1165 Type 2 diabetes mellitus with hyperglycemia: Secondary | ICD-10-CM | POA: Diagnosis not present

## 2017-10-21 DIAGNOSIS — R002 Palpitations: Secondary | ICD-10-CM | POA: Diagnosis not present

## 2017-10-21 DIAGNOSIS — I10 Essential (primary) hypertension: Secondary | ICD-10-CM | POA: Diagnosis not present

## 2017-10-22 LAB — LIPID PANEL
Cholesterol: 191 mg/dL (ref <200)
HDL: 50 mg/dL — ABNORMAL LOW (ref >50)
LDL CHOLESTEROL (CALC): 116 mg/dL — AB
Non-HDL Cholesterol (Calc): 141 mg/dL (calc) — ABNORMAL HIGH (ref <130)
TRIGLYCERIDES: 139 mg/dL (ref <150)
Total CHOL/HDL Ratio: 3.8 (calc) (ref <5.0)

## 2017-10-22 LAB — CBC WITH DIFFERENTIAL/PLATELET
BASOS ABS: 34 {cells}/uL (ref 0–200)
BASOS PCT: 0.4 %
EOS ABS: 193 {cells}/uL (ref 15–500)
EOS PCT: 2.3 %
HEMATOCRIT: 41.7 % (ref 35.0–45.0)
HEMOGLOBIN: 14 g/dL (ref 11.7–15.5)
LYMPHS ABS: 1957 {cells}/uL (ref 850–3900)
MCH: 28.8 pg (ref 27.0–33.0)
MCHC: 33.6 g/dL (ref 32.0–36.0)
MCV: 85.8 fL (ref 80.0–100.0)
MPV: 9.8 fL (ref 7.5–12.5)
Monocytes Relative: 4.9 %
NEUTROS ABS: 5804 {cells}/uL (ref 1500–7800)
Neutrophils Relative %: 69.1 %
Platelets: 339 10*3/uL (ref 140–400)
RBC: 4.86 10*6/uL (ref 3.80–5.10)
RDW: 12.4 % (ref 11.0–15.0)
Total Lymphocyte: 23.3 %
WBC mixed population: 412 cells/uL (ref 200–950)
WBC: 8.4 10*3/uL (ref 3.8–10.8)

## 2017-10-22 LAB — COMPLETE METABOLIC PANEL WITH GFR
AG Ratio: 1.5 (calc) (ref 1.0–2.5)
ALBUMIN MSPROF: 4.3 g/dL (ref 3.6–5.1)
ALKALINE PHOSPHATASE (APISO): 91 U/L (ref 33–130)
ALT: 14 U/L (ref 6–29)
AST: 17 U/L (ref 10–35)
BUN: 18 mg/dL (ref 7–25)
CALCIUM: 9.7 mg/dL (ref 8.6–10.4)
CO2: 31 mmol/L (ref 20–32)
CREATININE: 0.8 mg/dL (ref 0.50–0.99)
Chloride: 101 mmol/L (ref 98–110)
GFR, Est African American: 89 mL/min/{1.73_m2} (ref >=60)
GFR, Est Non African American: 77 mL/min/{1.73_m2} (ref >=60)
GLUCOSE: 159 mg/dL — AB (ref 65–99)
Globulin: 2.8 g/dL (calc) (ref 1.9–3.7)
Potassium: 4.3 mmol/L (ref 3.5–5.3)
Sodium: 140 mmol/L (ref 135–146)
TOTAL PROTEIN: 7.1 g/dL (ref 6.1–8.1)
Total Bilirubin: 0.3 mg/dL (ref 0.2–1.2)

## 2017-10-22 LAB — TSH: TSH: 1.46 m[IU]/L (ref 0.40–4.50)

## 2017-10-25 ENCOUNTER — Encounter: Payer: Self-pay | Admitting: Family Medicine

## 2017-10-25 ENCOUNTER — Ambulatory Visit (INDEPENDENT_AMBULATORY_CARE_PROVIDER_SITE_OTHER): Payer: Medicare Other | Admitting: Family Medicine

## 2017-10-25 VITALS — BP 99/44 | HR 65 | Ht 60.0 in | Wt 371.0 lb

## 2017-10-25 DIAGNOSIS — I9589 Other hypotension: Secondary | ICD-10-CM

## 2017-10-25 DIAGNOSIS — E1165 Type 2 diabetes mellitus with hyperglycemia: Secondary | ICD-10-CM | POA: Diagnosis not present

## 2017-10-25 DIAGNOSIS — E861 Hypovolemia: Secondary | ICD-10-CM | POA: Diagnosis not present

## 2017-10-25 DIAGNOSIS — IMO0001 Reserved for inherently not codable concepts without codable children: Secondary | ICD-10-CM

## 2017-10-25 MED ORDER — DULAGLUTIDE 0.75 MG/0.5ML ~~LOC~~ SOAJ: 0.7500 mg | SUBCUTANEOUS | 4 refills | Status: DC

## 2017-10-25 NOTE — Progress Notes (Signed)
   Subjective:    Patient ID: Natalie Wright, female    DOB: 1950-12-13, 67 y.o.   MRN: [id number]  HPI Diabetes - no hypoglycemic events. No wounds or sores that are not healing well. No increased thirst or urination. Checking glucose at home. Taking medications as prescribed without any side effects.  Taking  35 units of Lantu  She has started the Trulicity 3.23.  She said she felt particularly tired and lethargic when she first started it but it actually seems to be better.  Now when she does the shot she only feels a little extra tired for about a day.  She says over the last week her blood sugars have been ranging from 90 up to 159.  She had a 90 this morning and said she woke up feeling sweaty and nauseated and had to get to the kitchen to get something.  She says typically when her blood sugar drops below 100 she will actually feel it.  She still having some difficulty with ear ringing.  She also wanted to discuss her low blood pressures.  Her blood pressure was low the other day and again today.  She says that she really is still struggling getting water in.  She tries just shoot for about 62 ounces a day but sometimes only gets in about 32 ounces.  She takes her chlorthalidone daily.    Review of Systems     Objective:   Physical Exam  Constitutional: She is oriented to person, place, and time. She appears well-developed and well-nourished.  HENT:  Head: Normocephalic and atraumatic.  Cardiovascular: Normal rate, regular rhythm and normal heart sounds.  Pulmonary/Chest: Effort normal and breath sounds normal.  Neurological: She is alert and oriented to person, place, and time.  Skin: Skin is warm and dry.  Psychiatric: She has a normal mood and affect. Her behavior is normal.        Assessment & Plan:  DM - Uncontrolled.  A1C is uncontrolled with A1C of 7.8. Doing well on tRulicity.  New rx sent to mail order.    F/U in 3 months.    Hypotension.  _Okay to skip the  chlorthalidone on days where her blood pressure is running low.  She does have a home blood pressure cuff.  Or if she knows she is can be out running a lot of errands and probably will get a lot of fluid in that day and she can also skip the chlorthalidone on those days.  Otherwise continue to try to push for at least 64 ounces of fluid daily.  He really does not feel symptomatic when her blood pressures is low but I do wonder if it could be contributing to some of her fatigue and low energy.

## 2017-11-01 DIAGNOSIS — I471 Supraventricular tachycardia: Secondary | ICD-10-CM | POA: Diagnosis not present

## 2017-11-12 DIAGNOSIS — G4733 Obstructive sleep apnea (adult) (pediatric): Secondary | ICD-10-CM | POA: Diagnosis not present

## 2017-11-13 DIAGNOSIS — G4733 Obstructive sleep apnea (adult) (pediatric): Secondary | ICD-10-CM | POA: Diagnosis not present

## 2017-11-13 DIAGNOSIS — Z6841 Body Mass Index (BMI) 40.0 and over, adult: Secondary | ICD-10-CM | POA: Diagnosis not present

## 2017-11-13 DIAGNOSIS — Z9989 Dependence on other enabling machines and devices: Secondary | ICD-10-CM | POA: Diagnosis not present

## 2017-11-13 DIAGNOSIS — G4719 Other hypersomnia: Secondary | ICD-10-CM | POA: Diagnosis not present

## 2017-11-20 DIAGNOSIS — H01003 Unspecified blepharitis right eye, unspecified eyelid: Secondary | ICD-10-CM | POA: Diagnosis not present

## 2017-11-20 DIAGNOSIS — E119 Type 2 diabetes mellitus without complications: Secondary | ICD-10-CM | POA: Diagnosis not present

## 2017-11-20 DIAGNOSIS — H25813 Combined forms of age-related cataract, bilateral: Secondary | ICD-10-CM | POA: Diagnosis not present

## 2017-11-20 DIAGNOSIS — H527 Unspecified disorder of refraction: Secondary | ICD-10-CM | POA: Diagnosis not present

## 2017-11-20 DIAGNOSIS — H01006 Unspecified blepharitis left eye, unspecified eyelid: Secondary | ICD-10-CM | POA: Diagnosis not present

## 2017-11-20 DIAGNOSIS — H43813 Vitreous degeneration, bilateral: Secondary | ICD-10-CM | POA: Diagnosis not present

## 2017-11-20 DIAGNOSIS — H40003 Preglaucoma, unspecified, bilateral: Secondary | ICD-10-CM | POA: Diagnosis not present

## 2017-11-20 LAB — HM DIABETES EYE EXAM

## 2017-12-03 ENCOUNTER — Encounter: Payer: Self-pay | Admitting: Family Medicine

## 2017-12-03 DIAGNOSIS — L811 Chloasma: Secondary | ICD-10-CM

## 2017-12-11 ENCOUNTER — Encounter: Payer: Self-pay | Admitting: Family Medicine

## 2017-12-14 ENCOUNTER — Encounter: Payer: Self-pay | Admitting: Family Medicine

## 2017-12-16 MED ORDER — MECLIZINE HCL 25 MG PO TABS: 25.0000 mg | ORAL_TABLET | Freq: Three times a day (TID) | ORAL | 0 refills | Status: DC | PRN

## 2017-12-24 DIAGNOSIS — L811 Chloasma: Secondary | ICD-10-CM | POA: Diagnosis not present

## 2017-12-27 ENCOUNTER — Ambulatory Visit: Payer: Medicare Other | Admitting: Family Medicine

## 2018-01-21 ENCOUNTER — Ambulatory Visit (INDEPENDENT_AMBULATORY_CARE_PROVIDER_SITE_OTHER): Payer: Medicare Other | Admitting: Family Medicine

## 2018-01-21 ENCOUNTER — Encounter: Payer: Self-pay | Admitting: Family Medicine

## 2018-01-21 VITALS — BP 119/47 | HR 55 | Ht 60.0 in | Wt 372.0 lb

## 2018-01-21 DIAGNOSIS — L811 Chloasma: Secondary | ICD-10-CM | POA: Diagnosis not present

## 2018-01-21 DIAGNOSIS — IMO0001 Reserved for inherently not codable concepts without codable children: Secondary | ICD-10-CM

## 2018-01-21 DIAGNOSIS — K2100 Gastro-esophageal reflux disease with esophagitis, without bleeding: Secondary | ICD-10-CM

## 2018-01-21 DIAGNOSIS — E1165 Type 2 diabetes mellitus with hyperglycemia: Secondary | ICD-10-CM

## 2018-01-21 DIAGNOSIS — G4733 Obstructive sleep apnea (adult) (pediatric): Secondary | ICD-10-CM | POA: Diagnosis not present

## 2018-01-21 DIAGNOSIS — K449 Diaphragmatic hernia without obstruction or gangrene: Secondary | ICD-10-CM | POA: Diagnosis not present

## 2018-01-21 LAB — POCT GLYCOSYLATED HEMOGLOBIN (HGB A1C): HEMOGLOBIN A1C: 7 % — AB (ref 4.0–5.6)

## 2018-01-21 MED ORDER — PANTOPRAZOLE SODIUM 40 MG PO TBEC: 40.0000 mg | DELAYED_RELEASE_TABLET | Freq: Every day | ORAL | 5 refills | Status: DC

## 2018-01-21 NOTE — Progress Notes (Signed)
Subjective:    CC: CM, BP  HPI:  Diabetes - no hypoglycemic events. No wounds or sores that are not healing well. No increased thirst or urination. Checking glucose at home. Taking medications as prescribed without any side effects.  Did have some nausea when she first started the Trulicity for about the first 3 weeks.  She noticed a big difference in her appetite while being on the medication.  But she admits she is having some difficulty with consistency.  She sometimes forgets to take it because it has to be refrigerated.  Hypotension - she has been skipping her diuretic when her BP is low.     Obstructive sleep apnea-she [facility]  They reduced her CPAP pressure to 14 cm of water pressure in May.  She would like a letter for jury duty.  She is unable to walk the distance from the parking lot into the courthouse building.  Also wonders if the more frequent palpitations and heart racing she is been experiencing could be aggravated by her reflux.  She notices that she has more belching and burping and reflux when she notices more palpitations.  She was on omeprazole about a year ago for gastric erosions but had come off since then.  She also has a diagnosis of hiatal hernia.  Past medical history, Surgical history, Family history not pertinant except as noted below, Social history, Allergies, and medications have been entered into the medical record, reviewed, and corrections made.   Review of Systems: No fevers, chills, night sweats, weight loss, chest pain, or shortness of breath.   Objective:    General: Well Developed, well nourished, and in no acute distress.  Neuro: Alert and oriented x3, extra-ocular muscles intact, sensation grossly intact.  HEENT: Normocephalic, atraumatic  Skin: Warm and dry, no rashes. Cardiac: Regular rate and rhythm, no murmurs rubs or gallops, no lower extremity edema.  Respiratory: Clear to auscultation bilaterally.  Not using accessory muscles, speaking in full sentences.   Impression and Recommendations:    Hypotension -have her cut her lisinopril in half and take a half tab daily.  Keep an eye on the blood pressure the next 3 months.  If it starts to spike up then she can always go back to a whole tab if needed.  DM -improved down to 7.0 today which is great.  Now just need to get the A1c back down under 7.  Continue to work on [employer] he is doing great with her Trulicity which she just needs to work on consistency and we discussed several strategies including noting something on her calendar, sitting a smart phone medication reminder app, or putting a posted note on her refrigerator.  Also check to see if how long the medication can be left out on room air. Lab Results  Component Value Date   HGBA1C 7.0 (A) 01/21/2018     OSA -now down to 14 cm of water pressure.  Has follow-up later this year.  Palpitations-I do think her reflux could be aggravating her symptoms so we opted to restart her PPI for now and see if this helps control her symptoms she will let me know.

## 2018-01-21 NOTE — Patient Instructions (Signed)
Ok to cut your lisinopril in half and just take 20mg  daily.

## 2018-01-26 ENCOUNTER — Other Ambulatory Visit: Payer: Self-pay | Admitting: Family Medicine

## 2018-04-04 ENCOUNTER — Telehealth: Payer: Self-pay | Admitting: Family Medicine

## 2018-04-04 NOTE — Telephone Encounter (Signed)
Left a voicemail for pt to call back and schedule her medicare wellness appt with [facility] Wellness nurse

## 2018-04-12 ENCOUNTER — Emergency Department (INDEPENDENT_AMBULATORY_CARE_PROVIDER_SITE_OTHER)
Admission: EM | Admit: 2018-04-12 | Discharge: 2018-04-12 | Disposition: A | Discharge status: Home / Self Care | Payer: Medicare Other | Source: Home / Self Care | Attending: Emergency Medicine | Admitting: Emergency Medicine

## 2018-04-12 ENCOUNTER — Other Ambulatory Visit: Payer: Self-pay

## 2018-04-12 ENCOUNTER — Encounter: Payer: Self-pay | Admitting: Emergency Medicine

## 2018-04-12 ENCOUNTER — Emergency Department (INDEPENDENT_AMBULATORY_CARE_PROVIDER_SITE_OTHER): Payer: Medicare Other

## 2018-04-12 DIAGNOSIS — Z882 Allergy status to sulfonamides status: Secondary | ICD-10-CM | POA: Diagnosis not present

## 2018-04-12 DIAGNOSIS — M25552 Pain in left hip: Secondary | ICD-10-CM

## 2018-04-12 DIAGNOSIS — S72002A Fracture of unspecified part of neck of left femur, initial encounter for closed fracture: Secondary | ICD-10-CM | POA: Diagnosis not present

## 2018-04-12 DIAGNOSIS — Z7902 Long term (current) use of antithrombotics/antiplatelets: Secondary | ICD-10-CM | POA: Diagnosis not present

## 2018-04-12 DIAGNOSIS — Z794 Long term (current) use of insulin: Secondary | ICD-10-CM | POA: Diagnosis not present

## 2018-04-12 DIAGNOSIS — Z7982 Long term (current) use of aspirin: Secondary | ICD-10-CM | POA: Diagnosis not present

## 2018-04-12 DIAGNOSIS — Z888 Allergy status to other drugs, medicaments and biological substances status: Secondary | ICD-10-CM | POA: Diagnosis not present

## 2018-04-12 DIAGNOSIS — Z87891 Personal history of nicotine dependence: Secondary | ICD-10-CM | POA: Diagnosis not present

## 2018-04-12 DIAGNOSIS — Z881 Allergy status to other antibiotic agents status: Secondary | ICD-10-CM | POA: Diagnosis not present

## 2018-04-12 DIAGNOSIS — Z7951 Long term (current) use of inhaled steroids: Secondary | ICD-10-CM | POA: Diagnosis not present

## 2018-04-12 DIAGNOSIS — I10 Essential (primary) hypertension: Secondary | ICD-10-CM | POA: Diagnosis not present

## 2018-04-12 DIAGNOSIS — E119 Type 2 diabetes mellitus without complications: Secondary | ICD-10-CM | POA: Diagnosis not present

## 2018-04-12 DIAGNOSIS — Z79899 Other long term (current) drug therapy: Secondary | ICD-10-CM | POA: Diagnosis not present

## 2018-04-12 NOTE — ED Provider Notes (Addendum)
[name]    CSN: [id number] Arrival date & time: 04/12/18  1210     History   Chief Complaint No chief complaint on file.   HPI Natalie Wright is a 67 y.o. female.   HPI Patient was in her usual state of health until Thursday when while getting into her camry  and pulling her left leg into the vehicle she sustained  an injury to her lower back and hip area. Since that time she intermittently has had difficulty with ambulation it is difficult for her to get in a proper position. She does have pain with movement of the left hip as well as difficulty with walking at times. When she sits up perfectly straight she does feel better. Past Medical History:  Diagnosis Date  . Allergy    to cats and dogs  . Diabetes mellitus   . Hiatal hernia    and delayed gastric emptying/ sees [facility] for recurrent diarrhea  . Hypertension   . OSA (obstructive sleep apnea)    CPAP 34mmhg  . Pedal edema   . Spastic bladder    urology in [place]  . TIA (transient ischemic attack)    hx of- sees Dr Elwin Mocha (Neurology)    Patient Active Problem List   Diagnosis Date Noted  . Family history of ovarian cancer 04/02/2017  . Aortic atherosclerosis ([place]) 08/31/2016  . Venous stasis 08/10/2016  . Palpitations 07/16/2016  . 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 Diabetes type 2, uncontrolled ([place]) 08/14/2013  . 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 Cataract 04/18/2012  . Other and unspecified hyperlipidemia 02/13/2011  . Pulmonary nodules 09/22/2010  . LEG PAIN 09/12/2009  . Sleep apnea 04/13/2009  . GERD 03/14/2009  . Diabetes mellitus type 2, uncontrolled, without complications ([place]) 12/87/8676  . OBESITY, MORBID 12/10/2008  . RAYNAUD'S DISEASE 12/10/2008  . OSTEOARTHRITIS 12/10/2008  . URINARY INCONTINENCE 12/10/2008  . TRANSIENT ISCHEMIC ATTACKS, HX OF 12/10/2008    Past Surgical History:  Procedure Laterality Date  . vaginal skin tag  removal      OB History    Gravida  4   Para  2   Term  2   Preterm      AB  1   Living        SAB  1   TAB      Ectopic      Multiple      Live Births               Home Medications    Prior to Admission medications   Medication Sig Start Date End Date Taking? Authorizing Provider  acetaminophen (TYLENOL) 650 MG CR tablet Take 650 mg by mouth every 8 (eight) hours as needed.      [provider]  AMBULATORY NON FORMULARY MEDICATION Medication Name: please service CPAP machine or provide new CPAP machine if needed. Set on auto Pap for 1 month and then do a download to see if her pressure needs to be readjusted. Dx code: G47.30 (sleep apnea) FAX: [phone]; PHONE: [phone] [place] 06/08/16   Hali Marry, MD  AMBULATORY NON FORMULARY MEDICATION Medication Name: custom fit knee high compression stocking with 15-20 mmHg pressure. Open toe preferred. 08/10/16   Hali Marry, MD  AMBULATORY NON FORMULARY MEDICATION Medication Name: Please service her CPAP machine.  Humidifier not working and too much suction on the mask.  FAX: [phone]; PHONE: [phone] [place] 08/10/16  Hali Marry, MD  Armodafinil 250 MG tablet Take by mouth. 01/04/17   [provider]  aspirin 325 MG tablet Take 325 mg by mouth daily.    [provider]  cetirizine (ZYRTEC) 10 MG tablet Take 10 mg by mouth daily.      [provider]  chlorthalidone (HYGROTON) 25 MG tablet Take 1 tablet (25 mg total) by mouth daily. 09/27/17   Hali Marry, MD  clopidogrel (PLAVIX) 75 MG tablet TAKE 1 TABLET(75 MG) BY MOUTH DAILY 09/30/17   Hali Marry, MD  diltiazem (CARDIZEM CD) 360 MG 24 hr capsule TAKE 1 CAPSULE(360 MG) BY MOUTH DAILY 09/30/17   Hali Marry, MD  Dulaglutide (TRULICITY) 2.70 JJ/0.0XF SOPN Inject 0.75 mg into the skin every 7 (seven) days. 10/25/17   Hali Marry, MD  estradiol (ESTRACE  VAGINAL) 0.1 MG/GM vaginal cream Apply applicator vaginally Bi weekly 04/19/17   Guss Bunde, MD  Ferrous Sulfate Dried (FEOSOL) 200 (65 FE) MG TABS Take 1 tablet by mouth daily.      [provider]  fluconazole (DIFLUCAN) 150 MG tablet TAKE 1 TABLET BY MOUTH ONCE 06/03/17   Hali Marry, MD  fluticasone (FLONASE) 50 MCG/ACT nasal spray Place 2 sprays into both nostrils daily. 09/27/17   Hali Marry, MD  Insulin Pen Needle (NOVOFINE) 30G X 8 MM MISC For use each time injection for insulin 02/10/16   Hali Marry, MD  JARDIANCE 25 MG TABS tablet TAKE 1 TABLET DAILY 01/27/18   Emeterio Reeve, DO  lactase (LACTAID) 3000 UNITS tablet Take 3,000 Units by mouth as needed.     [provider]  LANTUS 100 UNIT/ML injection INJECT 30 UNITS UNDER THE SKIN DAILY 06/03/17   Hali Marry, MD  latanoprost (XALATAN) 0.005 % ophthalmic solution 1 drop.    [provider]  lisinopril (PRINIVIL,ZESTRIL) 40 MG tablet TAKE 1 TABLET(40 MG) BY MOUTH DAILY 09/30/17   Hali Marry, MD  meclizine (ANTIVERT) 25 MG tablet Take 1 tablet (25 mg total) by mouth 3 (three) times daily as needed for dizziness. 12/16/17   Hali Marry, MD  metFORMIN (GLUCOPHAGE) 1000 MG tablet TAKE 1 TABLET TWICE A DAY WITH MEALS 03/07/17   Hali Marry, MD  Omega-3 Fatty Acids (FISH OIL) 1200 MG CAPS Take 1 capsule by mouth daily.    [provider]  pantoprazole (PROTONIX) 40 MG tablet Take 1 tablet (40 mg total) by mouth daily. 01/21/18   Hali Marry, MD    Family History Family History  Problem Relation Age of Onset  . Diabetes Mother   . Hypertension Mother   . Glaucoma Mother   . Cancer Mother        bone cancer  . Alcohol abuse Father   . Emphysema Father   . Other Father        CHF  . Ovarian cancer Sister     Social History Social History   Tobacco Use  . Smoking status: Former Research scientist (life sciences)  . Smokeless tobacco: Never  Used  Substance Use Topics  . Alcohol use: Yes  . Drug use: No     Allergies   Atorvastatin; Beclomethasone; Cefaclor; Pioglitazone; Pravastatin; Sulfa antibiotics; Cephalexin; Cephalexin; Lipitor [atorvastatin calcium]; and Statins   Review of Systems Review of Systems  Genitourinary: Negative.   Musculoskeletal: Positive for back pain and gait problem.     Physical Exam Triage Vital Signs ED Triage Vitals  Enc Vitals  Group     BP 04/12/18 1249 (!) 166/67     Pulse Rate 04/12/18 1249 (!) 58     Resp 04/12/18 1249 18     Temp 04/12/18 1249 97.8 F (36.6 C)     Temp Source 04/12/18 1249 Oral     SpO2 04/12/18 1249 97 %     Weight 04/12/18 1250 (!) 371 lb (168.3 kg)     Height 04/12/18 1250 5' (1.524 m)     Head Circumference --      Peak Flow --      Pain Score 04/12/18 1250 9     Pain Loc --      Pain Edu? --      Excl. in [place]? --    No data found.  Updated Vital Signs BP (!) 166/67 (BP Location: Right Arm)   Pulse (!) 58   Temp 97.8 F (36.6 C) (Oral)   Resp 18   Ht 5' (1.524 m)   Wt (!) 168.3 kg   SpO2 97%   BMI 72.46 kg/m   Visual Acuity Right Eye Distance:   Left Eye Distance:   Bilateral Distance:    Right Eye Near:   Left Eye Near:    Bilateral Near:     Physical Exam  Constitutional: She is oriented to person, place, and time.  This patient is morbidly obese and currently sitting in a wheelchair.  Musculoskeletal:  There is tenderness over the superior gluteal area. She is able to internal/external rotate the left hip. Straight leg raising was negative. Capillary filling of the toes was normal.  Neurological: She is alert and oriented to person, place, and time.     UC Treatments / Results  Labs (all labs ordered are listed, but only abnormal results are displayed) Labs Reviewed - No data to display  EKG None  Radiology Dg Hip Unilat W Or Wo Pelvis 2-3 Views Left  Result Date: 04/12/2018 CLINICAL DATA:  Left hip pain EXAM: DG HIP  (WITH OR WITHOUT PELVIS) 2-3V LEFT COMPARISON:  None. FINDINGS: Internal rotation. Possible nondisplaced femoral neck fracture. Mild joint space narrowing in the left hip. Negative for AVN. Degenerative changes in the SI joints bilaterally. IMPRESSION: Exam is limited by internal rotation. Possible nondisplaced fracture of the left femoral neck. If the patient is not able to bear weight, follow-up MRI of the left hip without contrast is recommended. If the patient cannot have MRI, CT could be performed. Electronically Signed   By: Franchot Gallo M.D.   On: 04/12/2018 13:34    Procedures Procedures (including critical care time)  Medications Ordered in UC Medications - No data to display  Initial Impression / Assessment and Plan / UC Course  I have reviewed the triage vital signs and the nursing notes.  Pertinent labs & imaging results that were available during my care of the patient were reviewed by me and considered in my medical decision making (see chart for details). There is concern on x-ray regarding a possible left femoral neck fracture Because of this finding patient will be sent to the emergency room in [place] for their reevaluation and further imaging studies.     Final Clinical Impressions(s) / UC Diagnoses   Final diagnoses:  Left hip pain     Discharge Instructions     Please go to the emergency room for furthers.    ED Prescriptions    None     Controlled Substance Prescriptions [place] Controlled Substance Registry consulted? Not  Applicable   Darlyne Russian, MD 04/12/18 1349    Darlyne Russian, MD 04/12/18 1351

## 2018-04-12 NOTE — Discharge Instructions (Addendum)
Please go to the emergency room for further evaluation.

## 2018-04-12 NOTE — ED Triage Notes (Signed)
Patient lifted and twisted her left leg to get into her car 2 days ago and now has excruciating pain in certain positions. Walked in using cane. No recent OTC.

## 2018-04-18 DIAGNOSIS — R52 Pain, unspecified: Secondary | ICD-10-CM | POA: Diagnosis not present

## 2018-04-18 DIAGNOSIS — M5416 Radiculopathy, lumbar region: Secondary | ICD-10-CM | POA: Diagnosis not present

## 2018-04-28 ENCOUNTER — Other Ambulatory Visit: Payer: Self-pay | Admitting: *Deleted

## 2018-04-28 MED ORDER — PANTOPRAZOLE SODIUM 40 MG PO TBEC: 40.0000 mg | DELAYED_RELEASE_TABLET | Freq: Every day | ORAL | 4 refills | Status: DC

## 2018-04-29 ENCOUNTER — Ambulatory Visit: Payer: Medicare Other | Admitting: Family Medicine

## 2018-04-29 DIAGNOSIS — L811 Chloasma: Secondary | ICD-10-CM | POA: Diagnosis not present

## 2018-04-29 DIAGNOSIS — L821 Other seborrheic keratosis: Secondary | ICD-10-CM | POA: Diagnosis not present

## 2018-05-01 DIAGNOSIS — M170 Bilateral primary osteoarthritis of knee: Secondary | ICD-10-CM | POA: Diagnosis not present

## 2018-05-01 DIAGNOSIS — R29898 Other symptoms and signs involving the musculoskeletal system: Secondary | ICD-10-CM | POA: Diagnosis not present

## 2018-05-01 DIAGNOSIS — R2689 Other abnormalities of gait and mobility: Secondary | ICD-10-CM | POA: Diagnosis not present

## 2018-05-01 DIAGNOSIS — Z6841 Body Mass Index (BMI) 40.0 and over, adult: Secondary | ICD-10-CM | POA: Diagnosis not present

## 2018-05-01 DIAGNOSIS — M545 Low back pain: Secondary | ICD-10-CM | POA: Diagnosis not present

## 2018-05-01 DIAGNOSIS — M5416 Radiculopathy, lumbar region: Secondary | ICD-10-CM | POA: Diagnosis not present

## 2018-05-05 ENCOUNTER — Other Ambulatory Visit: Payer: Self-pay | Admitting: Family Medicine

## 2018-05-15 ENCOUNTER — Ambulatory Visit (INDEPENDENT_AMBULATORY_CARE_PROVIDER_SITE_OTHER): Payer: Medicare Other | Admitting: Family Medicine

## 2018-05-15 ENCOUNTER — Encounter: Payer: Self-pay | Admitting: Family Medicine

## 2018-05-15 VITALS — BP 116/42 | HR 44 | Ht 60.0 in | Wt 364.0 lb

## 2018-05-15 DIAGNOSIS — Z8679 Personal history of other diseases of the circulatory system: Secondary | ICD-10-CM | POA: Diagnosis not present

## 2018-05-15 DIAGNOSIS — K219 Gastro-esophageal reflux disease without esophagitis: Secondary | ICD-10-CM

## 2018-05-15 DIAGNOSIS — Z8673 Personal history of transient ischemic attack (TIA), and cerebral infarction without residual deficits: Secondary | ICD-10-CM

## 2018-05-15 DIAGNOSIS — G4733 Obstructive sleep apnea (adult) (pediatric): Secondary | ICD-10-CM

## 2018-05-15 DIAGNOSIS — E1165 Type 2 diabetes mellitus with hyperglycemia: Secondary | ICD-10-CM | POA: Diagnosis not present

## 2018-05-15 DIAGNOSIS — Z23 Encounter for immunization: Secondary | ICD-10-CM | POA: Diagnosis not present

## 2018-05-15 DIAGNOSIS — IMO0001 Reserved for inherently not codable concepts without codable children: Secondary | ICD-10-CM

## 2018-05-15 LAB — POCT GLYCOSYLATED HEMOGLOBIN (HGB A1C): HEMOGLOBIN A1C: 7 % — AB (ref 4.0–5.6)

## 2018-05-15 MED ORDER — LISINOPRIL 20 MG PO TABS: 20.0000 mg | ORAL_TABLET | Freq: Every day | ORAL | 1 refills | Status: DC

## 2018-05-15 MED ORDER — MECLIZINE HCL 25 MG PO TABS: 25.0000 mg | ORAL_TABLET | Freq: Three times a day (TID) | ORAL | 1 refills | Status: DC | PRN

## 2018-05-15 MED ORDER — CLOPIDOGREL BISULFATE 75 MG PO TABS: ORAL_TABLET | ORAL | 3 refills | Status: DC

## 2018-05-15 MED ORDER — DULAGLUTIDE 1.5 MG/0.5ML ~~LOC~~ SOAJ: 1.5000 mg | SUBCUTANEOUS | 3 refills | Status: DC

## 2018-05-15 MED ORDER — CHLORTHALIDONE 25 MG PO TABS: 25.0000 mg | ORAL_TABLET | Freq: Every day | ORAL | 1 refills | Status: DC

## 2018-05-15 MED ORDER — INSULIN GLARGINE 100 UNIT/ML ~~LOC~~ SOLN: SUBCUTANEOUS | 4 refills | Status: DC

## 2018-05-15 NOTE — Progress Notes (Signed)
Subjective:    CC:   HPI:  Diabetes - no hypoglycemic events. No wounds or sores that are not healing well. No increased thirst or urination. Checking glucose at home. Taking medications as prescribed without any side effects. We had discussed trulicity. She has lost about 7 lbs.   she is using 35 units of her Lantus.  Actually been taking a half a tab of her lisinopril daily and then taking her diuretic every other day.  GERD -she says the Protonix has really helped with the stomach gurgling and upset.  It made a big difference.  He also wanted to let me know she actually went to urgent care for left hip pain.  Initially they did an x-ray and thought maybe she had fractured it but they did not doing a CT for confirmation and I feel like it was probably more neuromusculoskeletal related.  She said her hip started feeling better but then her low back started to hurt so she followed up with the orthopedist and they have recommended physical therapy which she is going to start.  He does have a follow-up scheduled tomorrow for her sleep apnea.  He said that she is doing so well that he may even discontinue it but she will find out for sure when she goes for her appointment.   Past medical history, Surgical history, Family history not pertinant except as noted below, Social history, Allergies, and medications have been entered into the medical record, reviewed, and corrections made.   Review of Systems: No fevers, chills, night sweats, weight loss, chest pain, or shortness of breath.   Objective:    General: Well Developed, well nourished, and in no acute distress.  Neuro: Alert and oriented x3, extra-ocular muscles intact, sensation grossly intact.  HEENT: Normocephalic, atraumatic  Skin: Warm and dry, no rashes. Cardiac: Regular rate and rhythm, no murmurs rubs or gallops, no lower extremity edema.  Respiratory: Clear to auscultation bilaterally. Not using accessory muscles, speaking in full  sentences.   Impression and Recommendations:    DM - Borderline. A1C of 7.0 today.  Stable from previous.  Discussed options.  Increase tulicity to 1.5mg . F/U in 3 mo.   GERD -dyspepsia and stomach symptoms much improved on Protonix we will continue with current regimen.  He still doing well when I see her back in 3 months and we will consider weaning the medication  Low back/Left hip pain - starting PT via ortho  Obstructive sleep apnea-has follow-up appointment tomorrow.

## 2018-05-16 DIAGNOSIS — Z9989 Dependence on other enabling machines and devices: Secondary | ICD-10-CM | POA: Diagnosis not present

## 2018-05-16 DIAGNOSIS — G4719 Other hypersomnia: Secondary | ICD-10-CM | POA: Diagnosis not present

## 2018-05-16 DIAGNOSIS — G4733 Obstructive sleep apnea (adult) (pediatric): Secondary | ICD-10-CM | POA: Diagnosis not present

## 2018-05-16 DIAGNOSIS — J3089 Other allergic rhinitis: Secondary | ICD-10-CM | POA: Diagnosis not present

## 2018-05-21 DIAGNOSIS — H43813 Vitreous degeneration, bilateral: Secondary | ICD-10-CM | POA: Diagnosis not present

## 2018-05-21 DIAGNOSIS — M5416 Radiculopathy, lumbar region: Secondary | ICD-10-CM | POA: Diagnosis not present

## 2018-05-21 DIAGNOSIS — R269 Unspecified abnormalities of gait and mobility: Secondary | ICD-10-CM | POA: Diagnosis not present

## 2018-05-21 DIAGNOSIS — H0100A Unspecified blepharitis right eye, upper and lower eyelids: Secondary | ICD-10-CM | POA: Diagnosis not present

## 2018-05-21 DIAGNOSIS — H527 Unspecified disorder of refraction: Secondary | ICD-10-CM | POA: Diagnosis not present

## 2018-05-21 DIAGNOSIS — E119 Type 2 diabetes mellitus without complications: Secondary | ICD-10-CM | POA: Diagnosis not present

## 2018-05-21 DIAGNOSIS — H25813 Combined forms of age-related cataract, bilateral: Secondary | ICD-10-CM | POA: Diagnosis not present

## 2018-05-21 DIAGNOSIS — H40003 Preglaucoma, unspecified, bilateral: Secondary | ICD-10-CM | POA: Diagnosis not present

## 2018-05-21 DIAGNOSIS — H0100B Unspecified blepharitis left eye, upper and lower eyelids: Secondary | ICD-10-CM | POA: Diagnosis not present

## 2018-05-21 LAB — HM DIABETES EYE EXAM

## 2018-05-26 DIAGNOSIS — R269 Unspecified abnormalities of gait and mobility: Secondary | ICD-10-CM | POA: Diagnosis not present

## 2018-05-26 DIAGNOSIS — M5416 Radiculopathy, lumbar region: Secondary | ICD-10-CM | POA: Diagnosis not present

## 2018-05-30 ENCOUNTER — Other Ambulatory Visit: Payer: Self-pay

## 2018-05-30 ENCOUNTER — Encounter: Payer: Self-pay | Admitting: *Deleted

## 2018-05-30 ENCOUNTER — Emergency Department (INDEPENDENT_AMBULATORY_CARE_PROVIDER_SITE_OTHER)
Admission: EM | Admit: 2018-05-30 | Discharge: 2018-05-30 | Disposition: A | Discharge status: Home / Self Care | Payer: Medicare Other | Source: Home / Self Care | Attending: Family Medicine | Admitting: Family Medicine

## 2018-05-30 DIAGNOSIS — B370 Candidal stomatitis: Secondary | ICD-10-CM | POA: Diagnosis not present

## 2018-05-30 MED ORDER — FLUCONAZOLE 200 MG PO TABS: 200.0000 mg | ORAL_TABLET | Freq: Every day | ORAL | 0 refills | Status: DC

## 2018-05-30 NOTE — Discharge Instructions (Signed)
°  Please take your medication as prescribed and follow up with Dr. Madilyn Fireman in 1 week if not improving.

## 2018-05-30 NOTE — ED Provider Notes (Signed)
[name]    CSN: [id number] Arrival date & time: 05/30/18  1106     History   Chief Complaint Chief Complaint  Patient presents with  . Sore Throat    HPI Natalie Wright is a 67 y.o. female.   HPI Natalie Wright is a 67 y.o. female presenting to UC with c/o soreness in her mouth for 2 days with associated white film. She uses a CPAP machine at night, which has caused a chronic dry mouth. Pt also has hx of DM. No prior hx of thrush. She has been able to eat and drink w/o difficulty. No trouble breathing.   Past Medical History:  Diagnosis Date  . Allergy    to cats and dogs  . Diabetes mellitus   . Hiatal hernia    and delayed gastric emptying/ sees [facility] for recurrent diarrhea  . History of cardiovascular disorder 12/10/2008   Qualifier: Diagnosis of  By: Madilyn Fireman MD, Barnetta Chapel    . Hypertension   . OSA (obstructive sleep apnea)    CPAP 82mmhg  . Pedal edema   . Spastic bladder    urology in [place]  . TIA (transient ischemic attack)    hx of- sees Dr Elwin Mocha (Neurology)    Patient Active Problem List   Diagnosis Date Noted  . History of transient ischemic attack (TIA) 05/15/2018  . Family history of ovarian cancer 04/02/2017  . Aortic atherosclerosis ([place]) 08/31/2016  . Venous stasis 08/10/2016  . Palpitations 07/16/2016  . 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 Diabetes type 2, uncontrolled ([place]) 08/14/2013  . 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 Cataract 04/18/2012  . Other and unspecified hyperlipidemia 02/13/2011  . Pulmonary nodules 09/22/2010  . LEG PAIN 09/12/2009  . Sleep apnea 04/13/2009  . GERD 03/14/2009  . Diabetes mellitus type 2, uncontrolled, without complications ([place]) 40/98/1191  . OBESITY, MORBID 12/10/2008  . RAYNAUD'S DISEASE 12/10/2008  . OSTEOARTHRITIS 12/10/2008  . URINARY INCONTINENCE 12/10/2008    Past Surgical History:  Procedure Laterality Date  . vaginal skin tag removal       OB History    Gravida  4   Para  2   Term  2   Preterm      AB  1   Living        SAB  1   TAB      Ectopic      Multiple      Live Births               Home Medications    Prior to Admission medications   Medication Sig Start Date End Date Taking? Authorizing Provider  acetaminophen (TYLENOL) 650 MG CR tablet Take 650 mg by mouth every 8 (eight) hours as needed.      [provider]  AMBULATORY NON FORMULARY MEDICATION Medication Name: please service CPAP machine or provide new CPAP machine if needed. Set on auto Pap for 1 month and then do a download to see if her pressure needs to be readjusted. Dx code: G47.30 (sleep apnea) FAX: [phone]; PHONE: [phone] Areoflow 06/08/16   Hali Marry, MD  AMBULATORY NON FORMULARY MEDICATION Medication Name: custom fit knee high compression stocking with 15-20 mmHg pressure. Open toe preferred. 08/10/16   Hali Marry, MD  AMBULATORY NON FORMULARY MEDICATION Medication Name: Please service her CPAP machine.  Humidifier not working and too much suction on the mask.  FAX: [phone]; PHONE: [phone] Areoflow  08/10/16   Hali Marry, MD  Armodafinil 250 MG tablet Take by mouth. 01/04/17   [provider]  aspirin 325 MG tablet Take 325 mg by mouth daily.    [provider]  cetirizine (ZYRTEC) 10 MG tablet Take 10 mg by mouth daily.      [provider]  chlorthalidone (HYGROTON) 25 MG tablet Take 1 tablet (25 mg total) by mouth daily. 05/15/18   Hali Marry, MD  clopidogrel (PLAVIX) 75 MG tablet TAKE 1 TABLET(75 MG) BY MOUTH DAILY 05/15/18   Hali Marry, MD  diltiazem (CARDIZEM CD) 360 MG 24 hr capsule TAKE 1 CAPSULE(360 MG) BY MOUTH DAILY 09/30/17   Hali Marry, MD  Dulaglutide (TRULICITY) 1.5 OB/0.9GG SOPN Inject 1.5 mg into the skin every 7 (seven) days. 05/15/18   Hali Marry, MD  Ferrous Sulfate Dried (FEOSOL)  200 (65 FE) MG TABS Take 1 tablet by mouth daily.      [provider]  fluconazole (DIFLUCAN) 200 MG tablet Take 1 tablet (200 mg total) by mouth daily. 05/30/18   Noe Gens, PA-C  insulin glargine (LANTUS) 100 UNIT/ML injection INJECT 30 UNITS UNDER THE SKIN DAILY 05/15/18   Hali Marry, MD  Insulin Pen Needle (NOVOFINE) 30G X 8 MM MISC For use each time injection for insulin 02/10/16   Hali Marry, MD  JARDIANCE 25 MG TABS tablet TAKE 1 TABLET DAILY 01/27/18   Emeterio Reeve, DO  lactase (LACTAID) 3000 UNITS tablet Take 3,000 Units by mouth as needed.     [provider]  latanoprost (XALATAN) 0.005 % ophthalmic solution 1 drop.    [provider]  lisinopril (PRINIVIL,ZESTRIL) 20 MG tablet Take 1 tablet (20 mg total) by mouth daily. 05/15/18   Hali Marry, MD  meclizine (ANTIVERT) 25 MG tablet Take 1 tablet (25 mg total) by mouth 3 (three) times daily as needed for dizziness. 05/15/18   Hali Marry, MD  metFORMIN (GLUCOPHAGE) 1000 MG tablet TAKE 1 TABLET TWICE A DAY WITH MEALS 05/05/18   Hali Marry, MD  Omega-3 Fatty Acids (FISH OIL) 1200 MG CAPS Take 1 capsule by mouth daily.    [provider]  pantoprazole (PROTONIX) 40 MG tablet Take 1 tablet (40 mg total) by mouth daily. 04/28/18   Hali Marry, MD    Family History Family History  Problem Relation Age of Onset  . Diabetes Mother   . Hypertension Mother   . Glaucoma Mother   . Cancer Mother        bone cancer  . Alcohol abuse Father   . Emphysema Father   . Other Father        CHF  . Ovarian cancer Sister     Social History Social History   Tobacco Use  . Smoking status: Former Research scientist (life sciences)  . Smokeless tobacco: Never Used  Substance Use Topics  . Alcohol use: Yes  . Drug use: No     Allergies   Atorvastatin; Beclomethasone; Cefaclor; Pioglitazone; Pravastatin; Sulfa antibiotics; Cephalexin; Cephalexin; Lipitor  [atorvastatin calcium]; and Statins   Review of Systems Review of Systems  Constitutional: Negative for chills and fever.  HENT: Positive for mouth sores and sore throat. Negative for congestion.   Skin: Negative for rash.     Physical Exam Triage Vital Signs ED Triage Vitals  Enc Vitals Group     BP 05/30/18 1139 (!) 149/82     Pulse Rate 05/30/18 1139 62  Resp 05/30/18 1139 18     Temp 05/30/18 1139 97.9 F (36.6 C)     Temp Source 05/30/18 1139 Oral     SpO2 05/30/18 1139 95 %     Weight 05/30/18 1140 (!) 360 lb (163.3 kg)     Height 05/30/18 1140 5' (1.524 m)     Head Circumference --      Peak Flow --      Pain Score 05/30/18 1140 0     Pain Loc --      Pain Edu? --      Excl. in [place]? --    No data found.  Updated Vital Signs BP (!) 149/82 (BP Location: Right Arm)   Pulse 62   Temp 97.9 F (36.6 C) (Oral)   Resp 18   Ht 5' (1.524 m)   Wt (!) 360 lb (163.3 kg)   SpO2 95%   BMI 70.31 kg/m   Visual Acuity Right Eye Distance:   Left Eye Distance:   Bilateral Distance:    Right Eye Near:   Left Eye Near:    Bilateral Near:     Physical Exam  Constitutional: She is oriented to person, place, and time. She appears well-developed and well-nourished.  HENT:  Head: Normocephalic and atraumatic.  Mouth/Throat: Uvula is midline, oropharynx is clear and moist and mucous membranes are normal. Oral lesions present.  Diffuse white patches on tongue and buccal mucosa. No bleeding.   Eyes: EOM are normal.  Neck: Normal range of motion.  Cardiovascular: Normal rate.  Pulmonary/Chest: Effort normal.  Musculoskeletal: Normal range of motion.  Neurological: She is alert and oriented to person, place, and time.  Skin: Skin is warm and dry.  Psychiatric: She has a normal mood and affect. Her behavior is normal.  Nursing note and vitals reviewed.    UC Treatments / Results  Labs (all labs ordered are listed, but only abnormal results are displayed) Labs  Reviewed - No data to display  EKG None  Radiology No results found.  Procedures Procedures (including critical care time)  Medications Ordered in UC Medications - No data to display  Initial Impression / Assessment and Plan / UC Course  I have reviewed the triage vital signs and the nursing notes.  Pertinent labs & imaging results that were available during my care of the patient were reviewed by me and considered in my medical decision making (see chart for details).     Hx and exam c/w oral thrush  Home care info provided.  Final Clinical Impressions(s) / UC Diagnoses   Final diagnoses:  Oral thrush     Discharge Instructions      Please take your medication as prescribed and follow up with Dr. Madilyn Fireman in 1 week if not improving.     ED Prescriptions    Medication Sig Dispense Auth. Provider   fluconazole (DIFLUCAN) 200 MG tablet Take 1 tablet (200 mg total) by mouth daily. 7 tablet Noe Gens, PA-C     Controlled Substance Prescriptions [place] Controlled Substance Registry consulted? Not Applicable   Tyrell Antonio 05/30/18 1338

## 2018-05-30 NOTE — ED Triage Notes (Signed)
Pt c/o soreness in her mouth x 2 days. She wears a CPAP.

## 2018-06-02 ENCOUNTER — Other Ambulatory Visit: Payer: Self-pay | Admitting: *Deleted

## 2018-06-02 DIAGNOSIS — I10 Essential (primary) hypertension: Secondary | ICD-10-CM | POA: Diagnosis not present

## 2018-06-02 DIAGNOSIS — M5416 Radiculopathy, lumbar region: Secondary | ICD-10-CM | POA: Diagnosis not present

## 2018-06-02 DIAGNOSIS — I493 Ventricular premature depolarization: Secondary | ICD-10-CM | POA: Diagnosis not present

## 2018-06-02 DIAGNOSIS — I471 Supraventricular tachycardia: Secondary | ICD-10-CM | POA: Diagnosis not present

## 2018-06-02 DIAGNOSIS — E119 Type 2 diabetes mellitus without complications: Secondary | ICD-10-CM | POA: Diagnosis not present

## 2018-06-02 MED ORDER — DULAGLUTIDE 1.5 MG/0.5ML ~~LOC~~ SOAJ: 1.5000 mg | SUBCUTANEOUS | 4 refills | Status: DC

## 2018-06-09 DIAGNOSIS — M5416 Radiculopathy, lumbar region: Secondary | ICD-10-CM | POA: Diagnosis not present

## 2018-06-09 DIAGNOSIS — I471 Supraventricular tachycardia: Secondary | ICD-10-CM | POA: Diagnosis not present

## 2018-06-09 DIAGNOSIS — I493 Ventricular premature depolarization: Secondary | ICD-10-CM | POA: Diagnosis not present

## 2018-06-09 DIAGNOSIS — I10 Essential (primary) hypertension: Secondary | ICD-10-CM | POA: Diagnosis not present

## 2018-06-09 DIAGNOSIS — E119 Type 2 diabetes mellitus without complications: Secondary | ICD-10-CM | POA: Diagnosis not present

## 2018-07-03 ENCOUNTER — Encounter: Payer: Self-pay | Admitting: Family Medicine

## 2018-08-02 ENCOUNTER — Other Ambulatory Visit: Payer: Self-pay | Admitting: Osteopathic Medicine

## 2018-08-17 ENCOUNTER — Other Ambulatory Visit: Payer: Self-pay

## 2018-08-17 ENCOUNTER — Emergency Department (INDEPENDENT_AMBULATORY_CARE_PROVIDER_SITE_OTHER)
Admission: EM | Admit: 2018-08-17 | Discharge: 2018-08-17 | Disposition: A | Discharge status: Home / Self Care | Payer: Medicare Other | Source: Home / Self Care

## 2018-08-17 ENCOUNTER — Encounter: Payer: Self-pay | Admitting: Family Medicine

## 2018-08-17 DIAGNOSIS — K589 Irritable bowel syndrome without diarrhea: Secondary | ICD-10-CM | POA: Diagnosis not present

## 2018-08-17 DIAGNOSIS — E739 Lactose intolerance, unspecified: Secondary | ICD-10-CM | POA: Diagnosis not present

## 2018-08-17 MED ORDER — HYOSCYAMINE SULFATE SL 0.125 MG SL SUBL: 1.0000 | SUBLINGUAL_TABLET | Freq: Three times a day (TID) | SUBLINGUAL | 1 refills | Status: DC | PRN

## 2018-08-17 NOTE — ED Triage Notes (Signed)
Pt c/o stomach pain since last night. Felt sore throughout the night. Has hx of digestive issues but this pain feels different. Mentioned feeling like the RT side felt a little more swollen then usual. Took Protonix last night which seemed to help some. Pain 3/10 when sitting, up to 6/10 when moves.

## 2018-08-17 NOTE — Discharge Instructions (Addendum)
Try to avoid milk-containing products.  Follow-up with Dr. Madilyn Fireman on Tuesday for your 11:00 appointment.  Avoid eating breakfast if you can because they will be able to do an ultrasound if it is indicated at that time.

## 2018-08-17 NOTE — ED Provider Notes (Signed)
[name]    CSN: [id number] Arrival date & time: 08/17/18  1245     History   Chief Complaint Chief Complaint  Patient presents with  . Abdominal Pain    HPI Natalie Wright is a 68 y.o. female.   This is a 68 year old woman who presents with abdominal discomfort.  She has a history of hiatal hernia,  as well as diabetes, hypertension, and cardiovascular disease.  Pt c/o stomach pain since last night. Felt sore throughout the night. Has hx of digestive issues but this pain feels different. Mentioned feeling like the RT side felt a little more swollen then usual. Took Protonix last night which seemed to help some. Pain 3/10 when sitting, up to 6/10 when moves.  Patient has a history of lactose intolerance, "erosions" in either the esophagus or stomach, hiatal hernia, and recurrent diarrhea.  She has had no recent fever, blood in her stool or diarrhea or constipation.  Her discomfort seems to be in the right upper quadrant.  It is worse with deep breath or movement.  Patient states that her blood glucose has been normal as of late.      Past Medical History:  Diagnosis Date  . Allergy    to cats and dogs  . Diabetes mellitus   . Hiatal hernia    and delayed gastric emptying/ sees [facility] for recurrent diarrhea  . History of cardiovascular disorder 12/10/2008   Qualifier: Diagnosis of  By: Madilyn Fireman MD, Barnetta Chapel    . Hypertension   . OSA (obstructive sleep apnea)    CPAP 78mmhg  . Pedal edema   . Spastic bladder    urology in [place]  . TIA (transient ischemic attack)    hx of- sees Dr Elwin Mocha (Neurology)    Patient Active Problem List   Diagnosis Date Noted  . History of transient ischemic attack (TIA) 05/15/2018  . Family history of ovarian cancer 04/02/2017  . Aortic atherosclerosis ([place]) 08/31/2016  . Venous stasis 08/10/2016  . Palpitations 07/16/2016  . 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 Diabetes type 2,  uncontrolled ([place]) 08/14/2013  . 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 Cataract 04/18/2012  . Other and unspecified hyperlipidemia 02/13/2011  . Pulmonary nodules 09/22/2010  . LEG PAIN 09/12/2009  . Sleep apnea 04/13/2009  . GERD 03/14/2009  . Diabetes mellitus type 2, uncontrolled, without complications ([place]) 33/29/5188  . OBESITY, MORBID 12/10/2008  . RAYNAUD'S DISEASE 12/10/2008  . OSTEOARTHRITIS 12/10/2008  . URINARY INCONTINENCE 12/10/2008    Past Surgical History:  Procedure Laterality Date  . vaginal skin tag removal      OB History    Gravida  4   Para  2   Term  2   Preterm      AB  1   Living        SAB  1   TAB      Ectopic      Multiple      Live Births               Home Medications    Prior to Admission medications   Medication Sig Start Date End Date Taking? Authorizing Provider  acetaminophen (TYLENOL) 650 MG CR tablet Take 650 mg by mouth every 8 (eight) hours as needed.      [provider]  AMBULATORY NON FORMULARY MEDICATION Medication Name: please service CPAP machine or provide new CPAP machine if needed. Set on auto Pap  for 1 month and then do a download to see if her pressure needs to be readjusted. Dx code: G47.30 (sleep apnea) FAX: [phone]; PHONE: [phone] Areoflow 06/08/16   Hali Marry, MD  AMBULATORY NON FORMULARY MEDICATION Medication Name: custom fit knee high compression stocking with 15-20 mmHg pressure. Open toe preferred. 08/10/16   Hali Marry, MD  AMBULATORY NON FORMULARY MEDICATION Medication Name: Please service her CPAP machine.  Humidifier not working and too much suction on the mask.  FAX: [phone]; PHONE: [phone] Areoflow 08/10/16   Hali Marry, MD  Armodafinil 250 MG tablet Take by mouth. 01/04/17   [provider]  aspirin 325 MG tablet Take 325 mg by mouth daily.    [provider]  cetirizine (ZYRTEC) 10 MG tablet Take 10 mg by mouth  daily.      [provider]  chlorthalidone (HYGROTON) 25 MG tablet Take 1 tablet (25 mg total) by mouth daily. 05/15/18   Hali Marry, MD  clopidogrel (PLAVIX) 75 MG tablet TAKE 1 TABLET(75 MG) BY MOUTH DAILY 05/15/18   Hali Marry, MD  diltiazem (CARDIZEM CD) 360 MG 24 hr capsule TAKE 1 CAPSULE(360 MG) BY MOUTH DAILY 09/30/17   Hali Marry, MD  Dulaglutide (TRULICITY) 1.5 UD/1.4HF SOPN Inject 1.5 mg into the skin every 7 (seven) days. 06/02/18   Hali Marry, MD  Ferrous Sulfate Dried (FEOSOL) 200 (65 FE) MG TABS Take 1 tablet by mouth daily.      [provider]  fluconazole (DIFLUCAN) 200 MG tablet Take 1 tablet (200 mg total) by mouth daily. 05/30/18   Noe Gens, PA-C  Hyoscyamine Sulfate SL (LEVSIN/SL) 0.125 MG SUBL Place 1 tablet under the tongue every 8 (eight) hours as needed. 08/17/18   Robyn Haber, MD  insulin glargine (LANTUS) 100 UNIT/ML injection INJECT 30 UNITS UNDER THE SKIN DAILY 05/15/18   Hali Marry, MD  Insulin Pen Needle (NOVOFINE) 30G X 8 MM MISC For use each time injection for insulin 02/10/16   Hali Marry, MD  JARDIANCE 25 MG TABS tablet TAKE 1 TABLET DAILY 01/27/18   Emeterio Reeve, DO  lactase (LACTAID) 3000 UNITS tablet Take 3,000 Units by mouth as needed.     [provider]  latanoprost (XALATAN) 0.005 % ophthalmic solution 1 drop.    [provider]  lisinopril (PRINIVIL,ZESTRIL) 20 MG tablet Take 1 tablet (20 mg total) by mouth daily. 05/15/18   Hali Marry, MD  meclizine (ANTIVERT) 25 MG tablet Take 1 tablet (25 mg total) by mouth 3 (three) times daily as needed for dizziness. 05/15/18   Hali Marry, MD  metFORMIN (GLUCOPHAGE) 1000 MG tablet TAKE 1 TABLET TWICE A DAY WITH MEALS 05/05/18   Hali Marry, MD  Omega-3 Fatty Acids (FISH OIL) 1200 MG CAPS Take 1 capsule by mouth daily.    [provider]  pantoprazole (PROTONIX) 40 MG  tablet Take 1 tablet (40 mg total) by mouth daily. 04/28/18   Hali Marry, MD    Family History Family History  Problem Relation Age of Onset  . Diabetes Mother   . Hypertension Mother   . Glaucoma Mother   . Cancer Mother        bone cancer  . Alcohol abuse Father   . Emphysema Father   . Other Father        CHF  . Ovarian cancer Sister     Social History Social History  Tobacco Use  . Smoking status: Former Research scientist (life sciences)  . Smokeless tobacco: Never Used  Substance Use Topics  . Alcohol use: Yes  . Drug use: No     Allergies   Atorvastatin; Beclomethasone; Cefaclor; Pioglitazone; Pravastatin; Sulfa antibiotics; Cephalexin; Cephalexin; Lipitor [atorvastatin calcium]; and Statins   Review of Systems Review of Systems   Physical Exam Triage Vital Signs ED Triage Vitals  Enc Vitals Group     BP      Pulse      Resp      Temp      Temp src      SpO2      Weight      Height      Head Circumference      Peak Flow      Pain Score      Pain Loc      Pain Edu?      Excl. in [place]?    No data found.  Updated Vital Signs BP 123/82 (BP Location: Right Arm)   Pulse 61   Temp 97.8 F (36.6 C) (Oral)   SpO2 97%    Physical Exam Vitals signs and nursing note reviewed.  Constitutional:      Appearance: She is well-developed.  HENT:     Head: Normocephalic.  Eyes:     General: No scleral icterus.    Extraocular Movements: Extraocular movements intact.     Pupils: Pupils are equal, round, and reactive to light.  Cardiovascular:     Rate and Rhythm: Normal rate and regular rhythm.     Heart sounds: Normal heart sounds.  Pulmonary:     Effort: Pulmonary effort is normal.     Breath sounds: Normal breath sounds.  Abdominal:     General: Abdomen is protuberant. Bowel sounds are normal.     Palpations: Abdomen is soft. There is no fluid wave, hepatomegaly or splenomegaly.     Tenderness: There is abdominal tenderness in the epigastric area.      Comments: Patient has a very large panniculus making abdominal exam very difficult  Skin:    General: Skin is warm.  Neurological:     General: No focal deficit present.     Mental Status: She is alert.  Psychiatric:        Mood and Affect: Mood normal.      UC Treatments / Results  Labs (all labs ordered are listed, but only abnormal results are displayed) Labs Reviewed - No data to display  EKG None  Radiology No results found.  Procedures Procedures (including critical care time)  Medications Ordered in UC Medications - No data to display  Initial Impression / Assessment and Plan / UC Course  I have reviewed the triage vital signs and the nursing notes.  Pertinent labs & imaging results that were available during my care of the patient were reviewed by me and considered in my medical decision making (see chart for details).    Final Clinical Impressions(s) / UC Diagnoses   Final diagnoses:  Lactose intolerance  Irritable bowel syndrome without diarrhea     Discharge Instructions     Try to avoid milk-containing products.  Follow-up with Dr. Madilyn Fireman on Tuesday for your 11:00 appointment.  Avoid eating breakfast if you can because they will be able to do an ultrasound if it is indicated at that time.    ED Prescriptions    Medication Sig Dispense Auth. Provider   Hyoscyamine Sulfate SL (LEVSIN/SL) 0.125 MG  SUBL Place 1 tablet under the tongue every 8 (eight) hours as needed. 12 each Robyn Haber, MD     Controlled Substance Prescriptions [place] Controlled Substance Registry consulted? Not Applicable   Robyn Haber, MD 08/17/18 1335

## 2018-08-18 ENCOUNTER — Ambulatory Visit: Payer: Medicare Other | Admitting: Family Medicine

## 2018-08-18 DIAGNOSIS — L821 Other seborrheic keratosis: Secondary | ICD-10-CM | POA: Diagnosis not present

## 2018-08-18 DIAGNOSIS — L811 Chloasma: Secondary | ICD-10-CM | POA: Diagnosis not present

## 2018-08-19 ENCOUNTER — Other Ambulatory Visit: Payer: Self-pay | Admitting: Family Medicine

## 2018-08-19 ENCOUNTER — Ambulatory Visit (INDEPENDENT_AMBULATORY_CARE_PROVIDER_SITE_OTHER): Payer: Medicare Other | Admitting: Family Medicine

## 2018-08-19 ENCOUNTER — Encounter: Payer: Self-pay | Admitting: Family Medicine

## 2018-08-19 VITALS — BP 129/47 | HR 57 | Ht 60.0 in | Wt 360.0 lb

## 2018-08-19 DIAGNOSIS — R109 Unspecified abdominal pain: Secondary | ICD-10-CM | POA: Diagnosis not present

## 2018-08-19 DIAGNOSIS — E1165 Type 2 diabetes mellitus with hyperglycemia: Secondary | ICD-10-CM | POA: Diagnosis not present

## 2018-08-19 DIAGNOSIS — L280 Lichen simplex chronicus: Secondary | ICD-10-CM | POA: Diagnosis not present

## 2018-08-19 DIAGNOSIS — E118 Type 2 diabetes mellitus with unspecified complications: Secondary | ICD-10-CM | POA: Diagnosis not present

## 2018-08-19 DIAGNOSIS — IMO0002 Reserved for concepts with insufficient information to code with codable children: Secondary | ICD-10-CM

## 2018-08-19 DIAGNOSIS — I10 Essential (primary) hypertension: Secondary | ICD-10-CM | POA: Diagnosis not present

## 2018-08-19 LAB — POCT GLYCOSYLATED HEMOGLOBIN (HGB A1C): Hemoglobin A1C: 7.8 % — AB (ref 4.0–5.6)

## 2018-08-19 MED ORDER — DULAGLUTIDE 1.5 MG/0.5ML ~~LOC~~ SOAJ: 1.5000 mg | SUBCUTANEOUS | 4 refills | Status: DC

## 2018-08-19 MED ORDER — SUCRALFATE 1 GM/10ML PO SUSP: 1.0000 g | Freq: Three times a day (TID) | ORAL | 0 refills | Status: DC

## 2018-08-19 NOTE — Progress Notes (Signed)
Subjective:    CC: DM f/u   HPI:  Diabetes - no hypoglycemic events. No wounds or sores that are not healing well. No increased thirst or urination. Checking glucose at home. Taking medications as prescribed without any side effects. We inc her trulicity at last OV.   Last A1c was 7.0.  She says she is admits she has slipped with her diet and is started drinking sodas again.  She did do well with the increase in the Trulicity though it is requesting a refill be sent to her mail order.  Aortic atherosclerosis -chest pain or shortness of breath.  Complains of pain on the right side of her abdomen.  She says it feels sore when she tries to get up from a sitting position.  It does not bother her when she lays down. Reports hx of problems with diarrhea dn loose stool but no changes with this pain.  In fact, she went to urgent care for this 2 days ago.  Commended a trial of hyoscyamine which she feels is not really helping. No labs done.  She says she has some intermittent nausea related to some other conditions but does not feel like it has been worsened since she has had the abdominal pain.  She also c/o of a rash that comes and goes over her left side.  She says it comes about twice a year.  She has had it biopsied by dermatology and was diagnosed with lichenoid infiltrates with pigmented incontinence.  She wants to know what she can put on it when it comes up.  Past medical history, Surgical history, Family history not pertinant except as noted below, Social history, Allergies, and medications have been entered into the medical record, reviewed, and corrections made.   Review of Systems: No fevers, chills, night sweats, weight loss, chest pain, or shortness of breath.   Objective:    General: Well Developed, well nourished, and in no acute distress.  Neuro: Alert and oriented x3, extra-ocular muscles intact, sensation grossly intact.  HEENT: Normocephalic, atraumatic  Skin: Warm and dry, no  rashes. Cardiac: Regular rate and rhythm, no murmurs rubs or gallops, no lower extremity edema.  Respiratory: Clear to auscultation bilaterally. Not using accessory muscles, speaking in full sentences.   Impression and Recommendations:    DM - Uncontrolled.  A1C up to 7.8.  She says she plans on getting back on track with her diet and plans on cutting sodas out again.  Right lateral abdominal pain-discussed differential including gallbladder, appendicitis, abdominal wall hernia or just abdominal wall pain.  Amenta increasing her Tylenol to 2 pills twice a day.  Will check CMP to evaluate liver enzymes as well as a CBC for infection.  With results once available.  We will try to schedule for abdominal CT.  HTN - Well controlled. Continue current regimen. Follow up in  3-4 months.  \ Lichenoid infiltrate -he Artie has some betamethasone at home.  I want her to try it on this area and see if it helps.  If not then please give [place] a call back.

## 2018-08-20 ENCOUNTER — Encounter: Payer: Self-pay | Admitting: Family Medicine

## 2018-08-20 LAB — CBC WITH DIFFERENTIAL/PLATELET
Absolute Monocytes: 432 cells/uL (ref 200–950)
Basophils Absolute: 32 cells/uL (ref 0–200)
Basophils Relative: 0.4 %
Eosinophils Absolute: 152 cells/uL (ref 15–500)
Eosinophils Relative: 1.9 %
HCT: 44.2 % (ref 35.0–45.0)
HEMOGLOBIN: 14.4 g/dL (ref 11.7–15.5)
Lymphs Abs: 1968 cells/uL (ref 850–3900)
MCH: 28.1 pg (ref 27.0–33.0)
MCHC: 32.6 g/dL (ref 32.0–36.0)
MCV: 86.2 fL (ref 80.0–100.0)
MPV: 10.1 fL (ref 7.5–12.5)
Monocytes Relative: 5.4 %
Neutro Abs: 5416 cells/uL (ref 1500–7800)
Neutrophils Relative %: 67.7 %
Platelets: 346 10*3/uL (ref 140–400)
RBC: 5.13 10*6/uL — ABNORMAL HIGH (ref 3.80–5.10)
RDW: 13 % (ref 11.0–15.0)
Total Lymphocyte: 24.6 %
WBC: 8 10*3/uL (ref 3.8–10.8)

## 2018-08-20 LAB — COMPLETE METABOLIC PANEL WITH GFR
AG Ratio: 1.6 (calc) (ref 1.0–2.5)
ALKALINE PHOSPHATASE (APISO): 84 U/L (ref 37–153)
ALT: 12 U/L (ref 6–29)
AST: 14 U/L (ref 10–35)
Albumin: 4.2 g/dL (ref 3.6–5.1)
BUN: 16 mg/dL (ref 7–25)
CO2: 31 mmol/L (ref 20–32)
Calcium: 9.5 mg/dL (ref 8.6–10.4)
Chloride: 99 mmol/L (ref 98–110)
Creat: 0.99 mg/dL (ref 0.50–0.99)
GFR, Est African American: 68 mL/min/{1.73_m2} (ref >=60)
GFR, Est Non African American: 59 mL/min/{1.73_m2} — ABNORMAL LOW (ref >=60)
Globulin: 2.6 g/dL (calc) (ref 1.9–3.7)
Glucose, Bld: 115 mg/dL (ref 65–139)
POTASSIUM: 3.9 mmol/L (ref 3.5–5.3)
Sodium: 139 mmol/L (ref 135–146)
Total Bilirubin: 0.5 mg/dL (ref 0.2–1.2)
Total Protein: 6.8 g/dL (ref 6.1–8.1)

## 2018-08-20 LAB — LIPID PANEL
Cholesterol: 187 mg/dL (ref <200)
HDL: 52 mg/dL (ref >=50)
LDL Cholesterol (Calc): 115 mg/dL (calc) — ABNORMAL HIGH
Non-HDL Cholesterol (Calc): 135 mg/dL (calc) — ABNORMAL HIGH (ref <130)
Total CHOL/HDL Ratio: 3.6 (calc) (ref <5.0)
Triglycerides: 95 mg/dL (ref <150)

## 2018-08-20 LAB — TSH: TSH: 1.72 mIU/L (ref 0.40–4.50)

## 2018-08-20 NOTE — Telephone Encounter (Signed)
Ok to change and send for 90 day supply?Marland KitchenMarland KitchenMarland KitchenElouise Munroe, [place]

## 2018-08-21 MED ORDER — EMPAGLIFLOZIN 25 MG PO TABS: 25.0000 mg | ORAL_TABLET | Freq: Every day | ORAL | 1 refills | Status: DC

## 2018-08-24 ENCOUNTER — Encounter: Payer: Self-pay | Admitting: Family Medicine

## 2018-08-30 ENCOUNTER — Other Ambulatory Visit (HOSPITAL_BASED_OUTPATIENT_CLINIC_OR_DEPARTMENT_OTHER): Payer: Medicare Other

## 2018-09-04 ENCOUNTER — Other Ambulatory Visit: Payer: Self-pay | Admitting: Family Medicine

## 2018-09-04 DIAGNOSIS — R109 Unspecified abdominal pain: Secondary | ICD-10-CM

## 2018-09-04 DIAGNOSIS — Z1231 Encounter for screening mammogram for malignant neoplasm of breast: Secondary | ICD-10-CM

## 2018-09-05 ENCOUNTER — Other Ambulatory Visit: Payer: Medicare Other

## 2018-09-05 ENCOUNTER — Ambulatory Visit (INDEPENDENT_AMBULATORY_CARE_PROVIDER_SITE_OTHER): Payer: Medicare Other

## 2018-09-05 DIAGNOSIS — R109 Unspecified abdominal pain: Secondary | ICD-10-CM

## 2018-09-05 DIAGNOSIS — Z1231 Encounter for screening mammogram for malignant neoplasm of breast: Secondary | ICD-10-CM

## 2018-09-05 DIAGNOSIS — K449 Diaphragmatic hernia without obstruction or gangrene: Secondary | ICD-10-CM | POA: Diagnosis not present

## 2018-09-05 MED ORDER — IOPAMIDOL (ISOVUE-300) INJECTION 61%
100.0000 mL | Freq: Once | INTRAVENOUS | Status: AC | PRN
  Administered 2018-09-05: 100 mL via INTRAVENOUS

## 2018-09-06 ENCOUNTER — Ambulatory Visit (HOSPITAL_BASED_OUTPATIENT_CLINIC_OR_DEPARTMENT_OTHER): Payer: Medicare Other

## 2018-09-08 ENCOUNTER — Encounter: Payer: Self-pay | Admitting: Family Medicine

## 2018-09-08 DIAGNOSIS — R195 Other fecal abnormalities: Secondary | ICD-10-CM

## 2018-09-09 MED ORDER — ROSUVASTATIN CALCIUM 10 MG PO TABS: 10.0000 mg | ORAL_TABLET | Freq: Every day | ORAL | 3 refills | Status: DC

## 2018-09-16 ENCOUNTER — Encounter: Payer: Self-pay | Admitting: Family Medicine

## 2018-10-14 ENCOUNTER — Encounter: Payer: Self-pay | Admitting: Family Medicine

## 2018-11-18 ENCOUNTER — Ambulatory Visit: Payer: Medicare Other | Admitting: Family Medicine

## 2018-12-05 DIAGNOSIS — I471 Supraventricular tachycardia: Secondary | ICD-10-CM | POA: Diagnosis not present

## 2018-12-05 DIAGNOSIS — I10 Essential (primary) hypertension: Secondary | ICD-10-CM | POA: Diagnosis not present

## 2018-12-05 DIAGNOSIS — R9431 Abnormal electrocardiogram [ECG] [EKG]: Secondary | ICD-10-CM | POA: Diagnosis not present

## 2019-01-14 ENCOUNTER — Ambulatory Visit (INDEPENDENT_AMBULATORY_CARE_PROVIDER_SITE_OTHER): Payer: Medicare Other | Admitting: Family Medicine

## 2019-01-14 ENCOUNTER — Other Ambulatory Visit: Payer: Self-pay

## 2019-01-14 ENCOUNTER — Encounter: Payer: Self-pay | Admitting: Family Medicine

## 2019-01-14 VITALS — BP 134/67 | HR 55 | Ht 60.0 in | Wt 348.0 lb

## 2019-01-14 DIAGNOSIS — B372 Candidiasis of skin and nail: Secondary | ICD-10-CM | POA: Diagnosis not present

## 2019-01-14 DIAGNOSIS — E785 Hyperlipidemia, unspecified: Secondary | ICD-10-CM

## 2019-01-14 DIAGNOSIS — E118 Type 2 diabetes mellitus with unspecified complications: Secondary | ICD-10-CM

## 2019-01-14 DIAGNOSIS — I10 Essential (primary) hypertension: Secondary | ICD-10-CM

## 2019-01-14 DIAGNOSIS — IMO0002 Reserved for concepts with insufficient information to code with codable children: Secondary | ICD-10-CM

## 2019-01-14 DIAGNOSIS — E1165 Type 2 diabetes mellitus with hyperglycemia: Secondary | ICD-10-CM

## 2019-01-14 LAB — POCT GLYCOSYLATED HEMOGLOBIN (HGB A1C): Hemoglobin A1C: 7 % — AB (ref 4.0–5.6)

## 2019-01-14 MED ORDER — FLUCONAZOLE 150 MG PO TABS: ORAL_TABLET | ORAL | 3 refills | Status: DC

## 2019-01-14 MED ORDER — CLOTRIMAZOLE-BETAMETHASONE 1-0.05 % EX CREA: TOPICAL_CREAM | Freq: Two times a day (BID) | CUTANEOUS | 3 refills | Status: AC | PRN

## 2019-01-14 NOTE — Assessment & Plan Note (Signed)
Much improved a1c down to 7.0 which is absolutely fantastic.  She is really made some great strides and feels like the Trulicity has really helped her better portion control.  In fact that on her home scale she got down to about 341 pounds that she has gained about 6 pounds back.  Continue to work on [employer] and staying active.

## 2019-01-14 NOTE — Progress Notes (Signed)
Established Patient Office Visit  Subjective:  Patient ID: Natalie Wright, female    DOB: June 06, 1951  Age: 68 y.o. MRN: [id number]  CC:  Chief Complaint  Patient presents with  . Diabetes    HPI Keandrea Tapley presents for   Diabetes - no hypoglycemic events. No wounds or sores that are not healing well. No increased thirst or urination. Checking glucose at home. Taking medications as prescribed without any side effects.  Severe obesity-she is actually really been doing fantastic.  She says the Trulicity has really helped curb her appetite keeps her little nauseated which she feels like is helpful in keeping her from overeating.  In fact on her home scale she got as low as 341 but is back up to 347 at home.  But she noticed that when she lost that weight that her swelling in her legs got significantly better.  She has been working hard on trying to do her recumbent bike and chair exercises.  She says she does not do it daily and got off track for a while but she is back to doing it and her goal is to do it every other day.  She would like a refill on the diflucan. She gets yeast infections under her skin folds.    She also saw Dr. Maudie Mercury recently cardiologist and made some recommendations on her medications.  They recommended that if her blood pressure starts dropping low to consider decreasing the Cardizem or the lisinopril.  Also discussed getting her LDL to goal of less than 70 and potentially adding Zetia if needed since she has only been able to tolerate the Crestor every other day.  Past Medical History:  Diagnosis Date  . Allergy    to cats and dogs  . Diabetes mellitus   . Hiatal hernia    and delayed gastric emptying/ sees [facility] for recurrent diarrhea  . History of cardiovascular disorder 12/10/2008   Qualifier: Diagnosis of  By: Madilyn Fireman MD, Barnetta Chapel    . Hypertension   . OSA (obstructive sleep apnea)    CPAP 9mmhg  . Pedal edema   . Spastic bladder    urology in [place]   . TIA (transient ischemic attack)    hx of- sees Dr Elwin Mocha (Neurology)    Past Surgical History:  Procedure Laterality Date  . vaginal skin tag removal      Family History  Problem Relation Age of Onset  . Diabetes Mother   . Hypertension Mother   . Glaucoma Mother   . Cancer Mother        bone cancer  . Alcohol abuse Father   . Emphysema Father   . Other Father        CHF  . Ovarian cancer Sister     Social History   Socioeconomic History  . Marital status: Married    Spouse name: Not on file  . Number of children: 2  . Years of education: Masters  . Highest education level: Not on file  Occupational History  . Occupation: Diplomatic Services operational officer: [employer]  Social Needs  . Financial resource strain: Not on file  . Food insecurity    Worry: Not on file    Inability: Not on file  . Transportation needs    Medical: Not on file    Non-medical: Not on file  Tobacco Use  . Smoking status: Former Research scientist (life sciences)  . Smokeless tobacco: Never Used  Substance and Sexual Activity  . Alcohol  use: Yes  . Drug use: No  . Sexual activity: Never  Lifestyle  . Physical activity    Days per week: Not on file    Minutes per session: Not on file  . Stress: Not on file  Relationships  . Social Herbalist on phone: Not on file    Gets together: Not on file    Attends religious service: Not on file    Active member of club or organization: Not on file    Attends meetings of clubs or organizations: Not on file    Relationship status: Not on file  . Intimate partner violence    Fear of current or ex partner: Not on file    Emotionally abused: Not on file    Physically abused: Not on file    Forced sexual activity: Not on file  Other Topics Concern  . Not on file  Social History Narrative   Married to [place].  Foster parent    Outpatient Medications Prior to Visit  Medication Sig Dispense Refill  . acetaminophen (TYLENOL) 650 MG CR tablet Take 650 mg by  mouth every 8 (eight) hours as needed.      . AMBULATORY NON FORMULARY MEDICATION Medication Name: please service CPAP machine or provide new CPAP machine if needed. Set on auto Pap for 1 month and then do a download to see if her pressure needs to be readjusted. Dx code: G47.30 (sleep apnea) FAX: [phone]; PHONE: [phone] Areoflow 1 Units 0  . AMBULATORY NON FORMULARY MEDICATION Medication Name: custom fit knee high compression stocking with 15-20 mmHg pressure. Open toe preferred. 1 vial 0  . AMBULATORY NON FORMULARY MEDICATION Medication Name: Please service her CPAP machine.  Humidifier not working and too much suction on the mask.  FAX: [phone]; PHONE: [phone] Areoflow 1 vial 0  . Armodafinil 250 MG tablet Take by mouth.    Marland Kitchen aspirin 325 MG tablet Take 325 mg by mouth daily.    . cetirizine (ZYRTEC) 10 MG tablet Take 10 mg by mouth daily.      . chlorthalidone (HYGROTON) 25 MG tablet Take 1 tablet (25 mg total) by mouth daily. 90 tablet 1  . clopidogrel (PLAVIX) 75 MG tablet TAKE 1 TABLET(75 MG) BY MOUTH DAILY 90 tablet 3  . diltiazem (CARDIZEM CD) 360 MG 24 hr capsule TAKE 1 CAPSULE(360 MG) BY MOUTH DAILY 90 capsule 0  . Dulaglutide (TRULICITY) 1.5 GY/1.8HU SOPN Inject 1.5 mg into the skin every 7 (seven) days. 12 pen 4  . empagliflozin (JARDIANCE) 25 MG TABS tablet Take 25 mg by mouth daily. 90 tablet 1  . Ferrous Sulfate Dried (FEOSOL) 200 (65 FE) MG TABS Take 1 tablet by mouth daily.      . insulin glargine (LANTUS) 100 UNIT/ML injection INJECT 30 UNITS UNDER THE SKIN DAILY (Patient taking differently: 35 Units daily. INJECT 35 UNITS UNDER THE SKIN DAILY) 30 mL 4  . Insulin Pen Needle (NOVOFINE) 30G X 8 MM MISC For use each time injection for insulin 100 each 4  . lactase (LACTAID) 3000 UNITS tablet Take 3,000 Units by mouth as needed.     . latanoprost (XALATAN) 0.005 % ophthalmic solution 1 drop.    Marland Kitchen lisinopril (PRINIVIL,ZESTRIL) 20 MG tablet Take 1 tablet (20  mg total) by mouth daily. 90 tablet 1  . meclizine (ANTIVERT) 25 MG tablet Take 1 tablet (25 mg total) by mouth 3 (three) times daily as needed for dizziness. 90 tablet 1  .  metFORMIN (GLUCOPHAGE) 1000 MG tablet TAKE 1 TABLET TWICE A DAY WITH MEALS 180 tablet 4  . Omega-3 Fatty Acids (FISH OIL) 1200 MG CAPS Take 1 capsule by mouth daily.    . pantoprazole (PROTONIX) 40 MG tablet Take 1 tablet (40 mg total) by mouth daily. 90 tablet 4  . rosuvastatin (CRESTOR) 10 MG tablet Take 1 tablet (10 mg total) by mouth daily. 30 tablet 3  . sucralfate (CARAFATE) 1 GM/10ML suspension Take 10 mLs (1 g total) by mouth 4 (four) times daily -  with meals and at bedtime. 420 mL 0  . fluconazole (DIFLUCAN) 200 MG tablet Take 1 tablet (200 mg total) by mouth daily. 7 tablet 0  . Hyoscyamine Sulfate SL (LEVSIN/SL) 0.125 MG SUBL Place 1 tablet under the tongue every 8 (eight) hours as needed. 12 each 1   No facility-administered medications prior to visit.     Allergies  Allergen Reactions  . Atorvastatin Other (See Comments)    Patient stated muscle weakness Patient stated muscle weakness  . Beclomethasone Other (See Comments)    unknown unknown  . Cefaclor Hives  . Pioglitazone Other (See Comments)    Mouth sores Mouth sores Mouth sores unknown REACTION: ulcers in the mouth  . Pravastatin Other (See Comments)    Chest pain.  Chest pain.   . Sulfa Antibiotics Hives    Other reaction(s): Agitation  . Cephalexin   . Cephalexin Other (See Comments)    unknown  . Lipitor [Atorvastatin Calcium] Other (See Comments)    myalgias  . Statins Other (See Comments)    Muscle pain Other reaction(s): Other (See Comments) Muscle pain    ROS Review of Systems    Objective:    Physical Exam  Constitutional: She is oriented to person, place, and time. She appears well-developed and well-nourished.  HENT:  Head: Normocephalic and atraumatic.  Cardiovascular: Normal rate, regular rhythm and normal  heart sounds.  Pulmonary/Chest: Effort normal and breath sounds normal.  Neurological: She is alert and oriented to person, place, and time.  Skin: Skin is warm and dry.  Psychiatric: She has a normal mood and affect. Her behavior is normal.    BP 134/67   Pulse (!) 55   Ht 5' (1.524 m)   Wt (!) 348 lb (157.9 kg)   SpO2 96%   BMI 67.96 kg/m  Wt Readings from Last 3 Encounters:  01/14/19 (!) 348 lb (157.9 kg)  08/19/18 (!) 360 lb (163.3 kg)  05/30/18 (!) 360 lb (163.3 kg)     There are no preventive care reminders to display for this patient.  There are no preventive care reminders to display for this patient.  Lab Results  Component Value Date   TSH 1.72 08/19/2018   Lab Results  Component Value Date   WBC 8.0 08/19/2018   HGB 14.4 08/19/2018   HCT 44.2 08/19/2018   MCV 86.2 08/19/2018   PLT 346 08/19/2018   Lab Results  Component Value Date   NA 139 08/19/2018   K 3.9 08/19/2018   CO2 31 08/19/2018   GLUCOSE 115 08/19/2018   BUN 16 08/19/2018   CREATININE 0.99 08/19/2018   BILITOT 0.5 08/19/2018   ALKPHOS 71 06/08/2016   AST 14 08/19/2018   ALT 12 08/19/2018   PROT 6.8 08/19/2018   ALBUMIN 4.3 06/08/2016   CALCIUM 9.5 08/19/2018   ANIONGAP 7 05/09/2015   Lab Results  Component Value Date   CHOL 187 08/19/2018   Lab Results  Component Value Date   HDL 52 08/19/2018   Lab Results  Component Value Date   LDLCALC 115 (H) 08/19/2018   Lab Results  Component Value Date   TRIG 95 08/19/2018   Lab Results  Component Value Date   CHOLHDL 3.6 08/19/2018   Lab Results  Component Value Date   HGBA1C 7.0 (A) 01/14/2019      Assessment & Plan:   Problem List Items Addressed This Visit      Cardiovascular and Mediastinum   Essential hypertension, benign    Ok to reduce Ace or Cardizem if BP starts to drop        Endocrine   Diabetes type 2, uncontrolled ([place])    Much improved a1c down to 7.0 which is absolutely fantastic.  She is really  made some great strides and feels like the Trulicity has really helped her better portion control.  In fact that on her home scale she got down to about 341 pounds that she has gained about 6 pounds back.  Continue to work on [employer] and staying active.        Musculoskeletal and Integument   Skin yeast infection    Refilled diflucan and topical lotrison. Infections have bc less frequent with recent weight loss.       Relevant Medications   clotrimazole-betamethasone (LOTRISONE) cream   fluconazole (DIFLUCAN) 150 MG tablet     Other   Hyperlipidemia    Plan to recheck LDL later this year.  With her significant dietary changes and weight loss I am hoping that it is better.  We may need to consider adding Zetia to better control her LDL especially with her history of stroke and atherosclerosis of the aorta.       Other Visit Diagnoses    Diabetes mellitus type 2 with complications, uncontrolled ([place])    -  Primary   Relevant Orders   POCT glycosylated hemoglobin (Hb A1C) (Completed)      Meds ordered this encounter  Medications  . clotrimazole-betamethasone (LOTRISONE) cream    Sig: Apply topically 2 (two) times daily as needed.    Dispense:  45 g    Refill:  3  . fluconazole (DIFLUCAN) 150 MG tablet    Sig: TAKE 1 TABLET BY MOUTH ONCE    Dispense:  7 tablet    Refill:  3    Follow-up: Return in about 3 months (around 04/16/2019) for Diabetes follow-up.    Beatrice Lecher, MD

## 2019-01-14 NOTE — Assessment & Plan Note (Signed)
Plan to recheck LDL later this year.  With her significant dietary changes and weight loss I am hoping that it is better.  We may need to consider adding Zetia to better control her LDL especially with her history of stroke and atherosclerosis of the aorta.

## 2019-01-14 NOTE — Assessment & Plan Note (Signed)
Refilled diflucan and topical lotrison. Infections have bc less frequent with recent weight loss.

## 2019-01-14 NOTE — Assessment & Plan Note (Signed)
Ok to reduce Ace or Cardizem if BP starts to drop

## 2019-01-16 DIAGNOSIS — R195 Other fecal abnormalities: Secondary | ICD-10-CM | POA: Diagnosis not present

## 2019-01-20 LAB — STOOL CULTURE
MICRO NUMBER: 681059
MICRO NUMBER:: 681057
MICRO NUMBER:: 681058
SHIGA RESULT:: NOT DETECTED
SPECIMEN QUALITY:: ADEQUATE
SPECIMEN QUALITY:: ADEQUATE
SPECIMEN QUALITY:: ADEQUATE

## 2019-01-27 ENCOUNTER — Other Ambulatory Visit: Payer: Self-pay | Admitting: *Deleted

## 2019-01-27 MED ORDER — ROSUVASTATIN CALCIUM 10 MG PO TABS: 10.0000 mg | ORAL_TABLET | Freq: Every day | ORAL | 3 refills | Status: DC

## 2019-02-06 ENCOUNTER — Other Ambulatory Visit: Payer: Self-pay | Admitting: Family Medicine

## 2019-03-08 ENCOUNTER — Other Ambulatory Visit: Payer: Self-pay | Admitting: Family Medicine

## 2019-03-10 ENCOUNTER — Ambulatory Visit (INDEPENDENT_AMBULATORY_CARE_PROVIDER_SITE_OTHER): Payer: Medicare Other | Admitting: *Deleted

## 2019-03-10 VITALS — BP 146/72 | HR 70 | Ht 61.0 in | Wt 348.0 lb

## 2019-03-10 DIAGNOSIS — Z0000 Encounter for general adult medical examination without abnormal findings: Secondary | ICD-10-CM

## 2019-03-10 NOTE — Progress Notes (Signed)
Subjective:   Natalie Wright is a 68 y.o. female who presents for an Initial Medicare Annual Wellness Visit.  Review of Systems    No ROS.  Medicare Wellness Virtual Visit.  Visual/audio telehealth visit, UTA vital signs.   See social history for additional risk factors.     Cardiac Risk Factors include: hypertension;sedentary lifestyle;obesity (BMI >30kg/m2);advanced age (>74men, >85 women);diabetes mellitus;dyslipidemia Sleep patterns:  Getting on average 7-8 hours of sleep a night. Wakes up 3 times to void during the night. Wakes up feeling sluggish due to CPAP machine  Home Safety/Smoke Alarms: Feels safe in home. Smoke alarms in place.  Living environment;Lives with husband in a 1 story home and no stairs in or outside the home. Shower is a walk in shower with grab bars in place and a bench in the shower. Seat Belt Safety/Bike Helmet: Wears seat belt.   Female:   Pap- Aged out      Mammo-   UTD    Dexa scan-  UTD      CCS- UTD       Objective:    Today's Vitals   03/10/19 1125  BP: (!) 146/72  Pulse: 70  Weight: (!) 348 lb (157.9 kg)  Height: 5\' 1"  (1.549 m)   Body mass index is 65.75 kg/m.  Advanced Directives 03/10/2019  Does Patient Have a Medical Advance Directive? No  Would patient like information on creating a medical advance directive? No - Patient declined    Current Medications (verified) Outpatient Encounter Medications as of 03/10/2019  Medication Sig  . acetaminophen (TYLENOL) 650 MG CR tablet Take 650 mg by mouth every 8 (eight) hours as needed.    . AMBULATORY NON FORMULARY MEDICATION Medication Name: please service CPAP machine or provide new CPAP machine if needed. Set on auto Pap for 1 month and then do a download to see if her pressure needs to be readjusted. Dx code: G47.30 (sleep apnea) FAXQF:[id number]; PHONE: [phone] [place]  . AMBULATORY NON FORMULARY MEDICATION Medication Name: custom fit knee high compression stocking with  15-20 mmHg pressure. Open toe preferred.  . AMBULATORY NON FORMULARY MEDICATION Medication Name: Please service her CPAP machine.  Humidifier not working and too much suction on the mask.  FAXQF:[id number]; PHONE: [phone] [place]  . aspirin 325 MG tablet Take 325 mg by mouth daily.  . cetirizine (ZYRTEC) 10 MG tablet Take 10 mg by mouth daily.    . chlorthalidone (HYGROTON) 25 MG tablet Take 1 tablet (25 mg total) by mouth daily.  . clopidogrel (PLAVIX) 75 MG tablet TAKE 1 TABLET(75 MG) BY MOUTH DAILY  . clotrimazole-betamethasone (LOTRISONE) cream Apply topically 2 (two) times daily as needed.  . diltiazem (CARDIZEM CD) 360 MG 24 hr capsule TAKE 1 CAPSULE(360 MG) BY MOUTH DAILY  . Dulaglutide (TRULICITY) 1.5 0000000 SOPN Inject 1.5 mg into the skin every 7 (seven) days.  . Ferrous Sulfate Dried (FEOSOL) 200 (65 FE) MG TABS Take 1 tablet by mouth daily.    . fluconazole (DIFLUCAN) 150 MG tablet TAKE 1 TABLET BY MOUTH ONCE  . insulin glargine (LANTUS) 100 UNIT/ML injection INJECT 30 UNITS UNDER THE SKIN DAILY (Patient taking differently: 35 Units daily. INJECT 35 UNITS UNDER THE SKIN DAILY)  . Insulin Pen Needle (NOVOFINE) 30G X 8 MM MISC For use each time injection for insulin  . JARDIANCE 25 MG TABS tablet TAKE 1 TABLET DAILY  . lactase (LACTAID) 3000 UNITS tablet Take 3,000 Units by mouth as needed.   Marland Kitchen  latanoprost (XALATAN) 0.005 % ophthalmic solution 1 drop.  Marland Kitchen lisinopril (ZESTRIL) 20 MG tablet TAKE 1 TABLET DAILY  . meclizine (ANTIVERT) 25 MG tablet Take 1 tablet (25 mg total) by mouth 3 (three) times daily as needed for dizziness.  . metFORMIN (GLUCOPHAGE) 1000 MG tablet TAKE 1 TABLET TWICE A DAY WITH MEALS  . Omega-3 Fatty Acids (FISH OIL) 1200 MG CAPS Take 1 capsule by mouth daily.  . pantoprazole (PROTONIX) 40 MG tablet Take 1 tablet (40 mg total) by mouth daily.  . rosuvastatin (CRESTOR) 10 MG tablet Take 1 tablet (10 mg total) by mouth daily.  . Armodafinil 250 MG tablet  Take by mouth.  . sucralfate (CARAFATE) 1 GM/10ML suspension Take 10 mLs (1 g total) by mouth 4 (four) times daily -  with meals and at bedtime. (Patient not taking: Reported on 03/10/2019)  . [DISCONTINUED] lisinopril (PRINIVIL,ZESTRIL) 20 MG tablet Take 1 tablet (20 mg total) by mouth daily.   No facility-administered encounter medications on file as of 03/10/2019.     Allergies (verified) Atorvastatin, Beclomethasone, Cefaclor, Pioglitazone, Pravastatin, Sulfa antibiotics, Cephalexin, Cephalexin, Lipitor [atorvastatin calcium], and Statins   History: Past Medical History:  Diagnosis Date  . Allergy    to cats and dogs  . Diabetes mellitus   . Hiatal hernia    and delayed gastric emptying/ sees [facility] for recurrent diarrhea  . History of cardiovascular disorder 12/10/2008   Qualifier: Diagnosis of  By: Madilyn Fireman MD, Barnetta Chapel    . Hypertension   . OSA (obstructive sleep apnea)    CPAP 70mmhg  . Pedal edema   . Spastic bladder    urology in [place]  . TIA (transient ischemic attack)    hx of- sees Dr Elwin Mocha (Neurology)   Past Surgical History:  Procedure Laterality Date  . vaginal skin tag removal     Family History  Problem Relation Age of Onset  . Diabetes Mother   . Hypertension Mother   . Glaucoma Mother   . Cancer Mother        bone cancer  . Alcohol abuse Father   . Emphysema Father   . Other Father        CHF  . Ovarian cancer Sister    Social History   Socioeconomic History  . Marital status: Married    Spouse name: Clifton James  . Number of children: 2  . Years of education: Masters  . Highest education level: Master's degree (e.g., MA, MS, MEng, MEd, MSW, MBA)  Occupational History  . Occupation: Diplomatic Services operational officer: [employer]    Comment: retired semi  Social Needs  . Financial resource strain: Not hard at all  . Food insecurity    Worry: Never true    Inability: Never true  . Transportation needs    Medical: No    Non-medical: No  Tobacco Use   . Smoking status: Former Research scientist (life sciences)  . Smokeless tobacco: Never Used  Substance and Sexual Activity  . Alcohol use: Yes    Comment: social  . Drug use: No  . Sexual activity: Never  Lifestyle  . Physical activity    Days per week: 5 days    Minutes per session: 10 min  . Stress: Not at all  Relationships  . Social connections    Talks on phone: More than three times a week    Gets together: Once a week    Attends religious service: Never    Active member of club or  organization: No    Attends meetings of clubs or organizations: Never    Relationship status: Married  Other Topics Concern  . Not on file  Social History Narrative   Married to Isabel.  Retired and works 1 day awake for her daughter just to be out and about.    Tobacco Counseling Counseling given: Not Answered   Clinical Intake:                        Activities of Daily Living In your present state of health, do you have any difficulty performing the following activities: 03/10/2019  Hearing? N  Vision? N  Difficulty concentrating or making decisions? N  Walking or climbing stairs? Y  Comment needs knee replacements  Dressing or bathing? N  Doing errands, shopping? N  Preparing Food and eating ? N  Using the Toilet? N  In the past six months, have you accidently leaked urine? Y  Do you have problems with loss of bowel control? N  Managing your Medications? N  Managing your Finances? N  Housekeeping or managing your Housekeeping? N  Some recent data might be hidden     Immunizations and Health Maintenance Immunization History  Administered Date(s) Administered  . Influenza Split 06/14/2011, 07/04/2012  . Influenza Whole 04/13/2009  . Influenza, High Dose Seasonal PF 05/10/2016, 04/12/2017, 05/15/2018  . Influenza,inj,Quad PF,6+ Mos 05/03/2014, 05/20/2015  . PPD Test 03/23/2011, 01/09/2013, 05/03/2014, 02/10/2016  . Pneumococcal Conjugate-13 11/19/2014  . Pneumococcal  Polysaccharide-23 05/13/2006, 01/04/2017  . Td 04/25/2009  . Zoster 05/10/2016   Health Maintenance Due  Topic Date Due  . INFLUENZA VACCINE  01/31/2019    Patient Care Team: Hali Marry, MD as PCP - General  Indicate any recent Medical Services you may have received from other than [facility] providers in the past year (date may be approximate).     Assessment:   This is a routine wellness examination for [place].Physical assessment deferred to PCP.   Hearing/Vision screen  Hearing Screening   125Hz  250Hz  500Hz  1000Hz  2000Hz  3000Hz  4000Hz  6000Hz  8000Hz   Right ear:           Left ear:           Comments: Hearing test not done due to visit done via telephone due to [place] pandemic  Vision Screening Comments: Vision test not done due to visit done via telephone due to [place] pandemic  Dietary issues and exercise activities discussed: Current Exercise Habits: Home exercise routine, Type of exercise: Other - see comments(recumbant bike), Time (Minutes): 10, Frequency (Times/Week): 5, Weekly Exercise (Minutes/Week): 50, Intensity: Mild, Exercise limited by: orthopedic condition(s) Diet eats a healthy diet of vegetables and fruits and proteins. Breakfast:oatmeal, boiled eggs and bacon, yogurt with blueberries Lunch: leftovers or crackers with cheese and nuts or a sandwich Dinner: Meat and vegetables. Drinks water daily just not enough- 2 20 ounce glasses a day. Drinks 1 cup of coffee and soda during the day. Limits soda to 1 a day      Goals    . Weight (lb) < 200 lb (90.7 kg)     Patient states she would like to loose 25 pounds.      Depression Screen PHQ 2/9 Scores 03/10/2019 01/14/2019 05/15/2018 09/27/2017 09/14/2016 05/10/2016  PHQ - 2 Score 0 0 0 0 0 0  PHQ- 9 Score - - - 1 - -    Fall Risk Fall Risk  03/10/2019 01/14/2019 05/15/2018 10/25/2017 05/10/2016  Falls  in the past year? 1 0 0 No Yes  Number falls in past yr: 1 0 - - 1  Injury with Fall? 0 0 - - No  Risk for  fall due to : Impaired balance/gait - Impaired balance/gait - -  Follow up Falls prevention discussed - - - -    Is the patient's home free of loose throw rugs in walkways, pet beds, electrical cords, etc?   yes      Grab bars in the bathroom? yes      Handrails on the stairs?   no      Adequate lighting?   yes   Cognitive Function:     6CIT Screen 03/10/2019  What Year? 0 points  What month? 0 points  What time? 0 points  Count back from 20 0 points  Months in reverse 0 points  Repeat phrase 0 points  Total Score 0    Screening Tests Health Maintenance  Topic Date Due  . INFLUENZA VACCINE  01/31/2019  . COLONOSCOPY  01/30/2020 (Originally 07/02/2017)  . TETANUS/TDAP  04/26/2019  . FOOT EXAM  05/16/2019  . OPHTHALMOLOGY EXAM  05/22/2019  . HEMOGLOBIN A1C  07/17/2019  . MAMMOGRAM  09/04/2020  . DEXA SCAN  06/26/2021  . Hepatitis C Screening  Completed  . PNA vac Low Risk Adult  Completed       Plan:      Ms. Mcgilberry , Thank you for taking time to come for your Medicare Wellness Visit. I appreciate your ongoing commitment to your health goals. Please review the following plan we discussed and let me know if I can assist you in the future.  Please schedule your next medicare wellness visit with me in 1 yr. Continue doing brain stimulating activities (puzzles, reading, adult coloring books, staying active) to keep memory sharp.   These are the goals we discussed: Goals    . Weight (lb) < 200 lb (90.7 kg)     Patient states she would like to loose 25 pounds.       This is a list of the screening recommended for you and due dates:  Health Maintenance  Topic Date Due  . Flu Shot  01/31/2019  . Colon Cancer Screening  01/30/2020*  . Tetanus Vaccine  04/26/2019  . Complete foot exam   05/16/2019  . Eye exam for diabetics  05/22/2019  . Hemoglobin A1C  07/17/2019  . Mammogram  09/04/2020  . DEXA scan (bone density measurement)  06/26/2021  .  Hepatitis C: One time  screening is recommended by Center for Disease Control  (CDC) for  adults born from [birth year] through 1965.   Completed  . Pneumonia vaccines  Completed  *Topic was postponed. The date shown is not the original due date.     I have personally reviewed and noted the following in the patient's chart:   . Medical and social history . Use of alcohol, tobacco or illicit drugs  . Current medications and supplements . Functional ability and status . Nutritional status . Physical activity . Advanced directives . List of other physicians . Hospitalizations, surgeries, and ER visits in previous 12 months . Vitals . Screenings to include cognitive, depression, and falls . Referrals and appointments  In addition, I have reviewed and discussed with patient certain preventive protocols, quality metrics, and best practice recommendations. A written personalized care plan for preventive services as well as general preventive health recommendations were provided to patient.     Joelene Millin  Brynda Peon, LPN   [id number]

## 2019-03-10 NOTE — Patient Instructions (Signed)
Ms. Tencza , Thank you for taking time to come for your Medicare Wellness Visit. I appreciate your ongoing commitment to your health goals. Please review the following plan we discussed and let me know if I can assist you in the future.  Please schedule your next medicare wellness visit with me in 1 yr. Continue doing brain stimulating activities (puzzles, reading, adult coloring books, staying active) to keep memory sharp.   These are the goals we discussed: Goals    . Weight (lb) < 200 lb (90.7 kg)     Patient states she would like to loose 25 pounds.

## 2019-03-19 ENCOUNTER — Encounter: Payer: Self-pay | Admitting: Family Medicine

## 2019-03-19 DIAGNOSIS — H43813 Vitreous degeneration, bilateral: Secondary | ICD-10-CM | POA: Diagnosis not present

## 2019-03-19 DIAGNOSIS — H0100B Unspecified blepharitis left eye, upper and lower eyelids: Secondary | ICD-10-CM | POA: Diagnosis not present

## 2019-03-19 DIAGNOSIS — H25813 Combined forms of age-related cataract, bilateral: Secondary | ICD-10-CM | POA: Diagnosis not present

## 2019-03-19 DIAGNOSIS — E119 Type 2 diabetes mellitus without complications: Secondary | ICD-10-CM | POA: Diagnosis not present

## 2019-03-19 DIAGNOSIS — H40003 Preglaucoma, unspecified, bilateral: Secondary | ICD-10-CM | POA: Diagnosis not present

## 2019-03-19 DIAGNOSIS — H0100A Unspecified blepharitis right eye, upper and lower eyelids: Secondary | ICD-10-CM | POA: Diagnosis not present

## 2019-03-19 DIAGNOSIS — H527 Unspecified disorder of refraction: Secondary | ICD-10-CM | POA: Diagnosis not present

## 2019-03-23 ENCOUNTER — Encounter: Payer: Self-pay | Admitting: Family Medicine

## 2019-04-16 ENCOUNTER — Ambulatory Visit: Payer: Medicare Other | Admitting: Family Medicine

## 2019-04-30 ENCOUNTER — Ambulatory Visit: Payer: Medicare Other | Admitting: Family Medicine

## 2019-05-15 ENCOUNTER — Encounter: Payer: Self-pay | Admitting: Family Medicine

## 2019-05-15 ENCOUNTER — Ambulatory Visit (INDEPENDENT_AMBULATORY_CARE_PROVIDER_SITE_OTHER): Payer: Medicare Other | Admitting: Family Medicine

## 2019-05-15 ENCOUNTER — Other Ambulatory Visit: Payer: Self-pay

## 2019-05-15 VITALS — BP 113/52 | HR 59 | Ht 61.0 in | Wt 357.0 lb

## 2019-05-15 DIAGNOSIS — E1165 Type 2 diabetes mellitus with hyperglycemia: Secondary | ICD-10-CM | POA: Diagnosis not present

## 2019-05-15 DIAGNOSIS — E785 Hyperlipidemia, unspecified: Secondary | ICD-10-CM | POA: Diagnosis not present

## 2019-05-15 DIAGNOSIS — I10 Essential (primary) hypertension: Secondary | ICD-10-CM | POA: Diagnosis not present

## 2019-05-15 DIAGNOSIS — Z23 Encounter for immunization: Secondary | ICD-10-CM

## 2019-05-15 DIAGNOSIS — Z9989 Dependence on other enabling machines and devices: Secondary | ICD-10-CM | POA: Diagnosis not present

## 2019-05-15 DIAGNOSIS — G5601 Carpal tunnel syndrome, right upper limb: Secondary | ICD-10-CM | POA: Diagnosis not present

## 2019-05-15 DIAGNOSIS — H9313 Tinnitus, bilateral: Secondary | ICD-10-CM

## 2019-05-15 DIAGNOSIS — G4719 Other hypersomnia: Secondary | ICD-10-CM | POA: Diagnosis not present

## 2019-05-15 DIAGNOSIS — G4733 Obstructive sleep apnea (adult) (pediatric): Secondary | ICD-10-CM | POA: Diagnosis not present

## 2019-05-15 LAB — POCT GLYCOSYLATED HEMOGLOBIN (HGB A1C): Hemoglobin A1C: 7.1 % — AB (ref 4.0–5.6)

## 2019-05-15 MED ORDER — TETANUS-DIPHTH-ACELL PERTUSSIS 5-2.5-18.5 LF-MCG/0.5 IM SUSP: 0.5000 mL | Freq: Once | INTRAMUSCULAR | 0 refills | Status: AC

## 2019-05-15 MED ORDER — ROSUVASTATIN CALCIUM 10 MG PO TABS: 10.0000 mg | ORAL_TABLET | ORAL | 3 refills | Status: DC

## 2019-05-15 MED ORDER — ZOSTER VAC RECOMB ADJUVANTED 50 MCG/0.5ML IM SUSR: 0.5000 mL | Freq: Once | INTRAMUSCULAR | 0 refills | Status: AC

## 2019-05-15 NOTE — Assessment & Plan Note (Signed)
He has been able to take the Crestor twice a week so just encouraged her to continue with that and will update medication list.

## 2019-05-15 NOTE — Assessment & Plan Note (Signed)
A1c 7.1 today.  Ultimate would like to get her under 7.0 but it is pretty close.  Just continue work on [employer] and regular exercise

## 2019-05-15 NOTE — Assessment & Plan Note (Signed)
Well-controlled.  Continue current regimen. 

## 2019-05-15 NOTE — Progress Notes (Signed)
Established Patient Office Visit  Subjective:  Patient ID: Natalie Wright, female    DOB: May 15, 1951  Age: 68 y.o. MRN: [id number]  CC:  Chief Complaint  Patient presents with  . Diabetes  . Hypertension    HPI Natalie Wright presents for   Hypertension- Pt denies chest pain, SOB, dizziness, or heart palpitations.  Taking meds as directed w/o problems.  Denies medication side effects.   Diabetes - no hypoglycemic events. No wounds or sores that are not healing well. No increased thirst or urination. Checking glucose at home. Taking medications as prescribed without any side effects. She has gained weight.    She has been having buzzing in both ear for several months.  She says it started sometime back in the summer.  She denies any head trauma or injury.  She said she had a lot of problems with her ears growing up but not so much as an adult.  She does not think she is had any significant hearing loss since the buzzing started it is bilateral she does not feel like one is worse than the other.  She does take a 325 mg aspirin daily for her history of TIA but says she does not really take extra if anything she just uses a Tylenol for pain..  Active sleep apnea-she did follow-up with pulmonary today.  She is been having problems with her machine cutting off in the middle the night and is working with them to possibly get a new machine.  Hyperlipidemia-she has been able to tolerate the Crestor 2 nights per week and says so far that has worked well and she is not experienced any myalgias with it.  She has 3 fingers on her right hand that go numb when she sleeps.  Notices it in the morning. Last about 30 min and then improves.     Past Medical History:  Diagnosis Date  . Allergy    to cats and dogs  . Diabetes mellitus   . Hiatal hernia    and delayed gastric emptying/ sees [facility] for recurrent diarrhea  . History of cardiovascular disorder 12/10/2008   Qualifier: Diagnosis of  By:  Madilyn Fireman MD, Barnetta Chapel    . Hypertension   . OSA (obstructive sleep apnea)    CPAP 34mmhg  . Pedal edema   . Spastic bladder    urology in [place]  . TIA (transient ischemic attack)    hx of- sees Dr Elwin Mocha (Neurology)    Past Surgical History:  Procedure Laterality Date  . vaginal skin tag removal      Family History  Problem Relation Age of Onset  . Diabetes Mother   . Hypertension Mother   . Glaucoma Mother   . Cancer Mother        bone cancer  . Alcohol abuse Father   . Emphysema Father   . Other Father        CHF  . Ovarian cancer Sister     Social History   Socioeconomic History  . Marital status: Married    Spouse name: Clifton James  . Number of children: 2  . Years of education: Masters  . Highest education level: Master's degree (e.g., MA, MS, MEng, MEd, MSW, MBA)  Occupational History  . Occupation: Diplomatic Services operational officer: [employer]    Comment: retired semi  Social Needs  . Financial resource strain: Not hard at all  . Food insecurity    Worry: Never true    Inability:  Never true  . Transportation needs    Medical: No    Non-medical: No  Tobacco Use  . Smoking status: Former Research scientist (life sciences)  . Smokeless tobacco: Never Used  Substance and Sexual Activity  . Alcohol use: Yes    Comment: social  . Drug use: No  . Sexual activity: Never  Lifestyle  . Physical activity    Days per week: 5 days    Minutes per session: 10 min  . Stress: Not at all  Relationships  . Social connections    Talks on phone: More than three times a week    Gets together: Once a week    Attends religious service: Never    Active member of club or organization: No    Attends meetings of clubs or organizations: Never    Relationship status: Married  . Intimate partner violence    Fear of current or ex partner: No    Emotionally abused: No    Physically abused: No    Forced sexual activity: No  Other Topics Concern  . Not on file  Social History Narrative   Married to  [place].  Retired and works 1 day awake for her daughter just to be out and about.    Outpatient Medications Prior to Visit  Medication Sig Dispense Refill  . Misc. Devices MISC Transfer of care to [facility].  Please provide service to her current CPAP machine for OSA at 14 cm. water pressure.  Send directly to [facility] at [facility].    Marland Kitchen acetaminophen (TYLENOL) 650 MG CR tablet Take 650 mg by mouth every 8 (eight) hours as needed.      . AMBULATORY NON FORMULARY MEDICATION Medication Name: please service CPAP machine or provide new CPAP machine if needed. Set on auto Pap for 1 month and then do a download to see if her pressure needs to be readjusted. Dx code: G47.30 (sleep apnea) FAX: [phone]; PHONE: [phone] [place]low 1 Units 0  . AMBULATORY NON FORMULARY MEDICATION Medication Name: custom fit knee high compression stocking with 15-20 mmHg pressure. Open toe preferred. 1 vial 0  . AMBULATORY NON FORMULARY MEDICATION Medication Name: Please service her CPAP machine.  Humidifier not working and too much suction on the mask.  FAXQF:[id number]; PHONE: [phone] Areoflow 1 vial 0  . Armodafinil 250 MG tablet Take by mouth.    Marland Kitchen aspirin 325 MG tablet Take 325 mg by mouth daily.    . cetirizine (ZYRTEC) 10 MG tablet Take 10 mg by mouth daily.      . chlorthalidone (HYGROTON) 25 MG tablet Take 1 tablet (25 mg total) by mouth daily. 90 tablet 1  . clopidogrel (PLAVIX) 75 MG tablet TAKE 1 TABLET(75 MG) BY MOUTH DAILY 90 tablet 3  . clotrimazole-betamethasone (LOTRISONE) cream Apply topically 2 (two) times daily as needed. 45 g 3  . diltiazem (CARDIZEM CD) 360 MG 24 hr capsule TAKE 1 CAPSULE(360 MG) BY MOUTH DAILY 90 capsule 0  . Dulaglutide (TRULICITY) 1.5 0000000 SOPN Inject 1.5 mg into the skin every 7 (seven) days. 12 pen 4  . Ferrous Sulfate Dried (FEOSOL) 200 (65 FE) MG TABS Take 1 tablet by mouth daily.      . fluconazole (DIFLUCAN) 150 MG tablet TAKE 1 TABLET BY MOUTH ONCE 7 tablet  3  . insulin glargine (LANTUS) 100 UNIT/ML injection INJECT 30 UNITS UNDER THE SKIN DAILY (Patient taking differently: 35 Units daily. INJECT 35 UNITS UNDER THE SKIN DAILY) 30 mL 4  . Insulin Pen  Needle (NOVOFINE) 30G X 8 MM MISC For use each time injection for insulin 100 each 4  . JARDIANCE 25 MG TABS tablet TAKE 1 TABLET DAILY 90 tablet 3  . lactase (LACTAID) 3000 UNITS tablet Take 3,000 Units by mouth as needed.     . latanoprost (XALATAN) 0.005 % ophthalmic solution 1 drop.    Marland Kitchen lisinopril (ZESTRIL) 20 MG tablet TAKE 1 TABLET DAILY 90 tablet 3  . meclizine (ANTIVERT) 25 MG tablet Take 1 tablet (25 mg total) by mouth 3 (three) times daily as needed for dizziness. 90 tablet 1  . metFORMIN (GLUCOPHAGE) 1000 MG tablet TAKE 1 TABLET TWICE A DAY WITH MEALS 180 tablet 4  . Omega-3 Fatty Acids (FISH OIL) 1200 MG CAPS Take 1 capsule by mouth daily.    . pantoprazole (PROTONIX) 40 MG tablet Take 1 tablet (40 mg total) by mouth daily. 90 tablet 4  . sucralfate (CARAFATE) 1 GM/10ML suspension Take 10 mLs (1 g total) by mouth 4 (four) times daily -  with meals and at bedtime. (Patient not taking: Reported on 03/10/2019) 420 mL 0  . rosuvastatin (CRESTOR) 10 MG tablet Take 1 tablet (10 mg total) by mouth daily. 90 tablet 3   No facility-administered medications prior to visit.     Allergies  Allergen Reactions  . Atorvastatin Other (See Comments)    Patient stated muscle weakness Patient stated muscle weakness  . Beclomethasone Other (See Comments)    unknown unknown  . Cefaclor Hives  . Pioglitazone Other (See Comments)    Mouth sores Mouth sores Mouth sores unknown REACTION: ulcers in the mouth  . Pravastatin Other (See Comments)    Chest pain.  Chest pain.   . Sulfa Antibiotics Hives    Other reaction(s): Agitation  . Cephalexin   . Cephalexin Other (See Comments)    unknown  . Lipitor [Atorvastatin Calcium] Other (See Comments)    myalgias  . Statins Other (See Comments)    Muscle  pain Other reaction(s): Other (See Comments) Muscle pain    ROS Review of Systems    Objective:    Physical Exam  Constitutional: She is oriented to person, place, and time. She appears well-developed and well-nourished.  HENT:  Head: Normocephalic and atraumatic.  Left TM and canal is clear.  Right ear canal is 90% occluded with cerumen but I am able to see a very small portion of the tympanic membrane which does not appear to be erythematous but I am unable to appreciate any fluid levels.  Cardiovascular: Normal rate, regular rhythm and normal heart sounds.  Pulmonary/Chest: Effort normal and breath sounds normal.  Musculoskeletal:     Comments: Right wrist is nontender.  Normal range of motion.  No swelling.  She has a negative Tinel's and Phalen sign.  Neurological: She is alert and oriented to person, place, and time.  Skin: Skin is warm and dry.  Psychiatric: She has a normal mood and affect. Her behavior is normal.    BP (!) 113/52   Pulse (!) 59   Ht 5\' 1"  (1.549 m)   Wt (!) 357 lb (161.9 kg)   BMI 67.45 kg/m  Wt Readings from Last 3 Encounters:  05/15/19 (!) 357 lb (161.9 kg)  03/10/19 (!) 348 lb (157.9 kg)  01/14/19 (!) 348 lb (157.9 kg)     Health Maintenance Due  Topic Date Due  . TETANUS/TDAP  04/26/2019    There are no preventive care reminders to display for this patient.  Lab Results  Component Value Date   TSH 1.72 08/19/2018   Lab Results  Component Value Date   WBC 8.0 08/19/2018   HGB 14.4 08/19/2018   HCT 44.2 08/19/2018   MCV 86.2 08/19/2018   PLT 346 08/19/2018   Lab Results  Component Value Date   NA 139 08/19/2018   K 3.9 08/19/2018   CO2 31 08/19/2018   GLUCOSE 115 08/19/2018   BUN 16 08/19/2018   CREATININE 0.99 08/19/2018   BILITOT 0.5 08/19/2018   ALKPHOS 71 06/08/2016   AST 14 08/19/2018   ALT 12 08/19/2018   PROT 6.8 08/19/2018   ALBUMIN 4.3 06/08/2016   CALCIUM 9.5 08/19/2018   ANIONGAP 7 05/09/2015   Lab  Results  Component Value Date   CHOL 187 08/19/2018   Lab Results  Component Value Date   HDL 52 08/19/2018   Lab Results  Component Value Date   LDLCALC 115 (H) 08/19/2018   Lab Results  Component Value Date   TRIG 95 08/19/2018   Lab Results  Component Value Date   CHOLHDL 3.6 08/19/2018   Lab Results  Component Value Date   HGBA1C 7.1 (A) 05/15/2019      Assessment & Plan:   Problem List Items Addressed This Visit      Cardiovascular and Mediastinum   Essential hypertension, benign - Primary    Well-controlled.  Continue current regimen.      Relevant Medications   rosuvastatin (CRESTOR) 10 MG tablet (Start on 05/18/2019)     Endocrine   Diabetes type 2, uncontrolled (HCC)    A1c 7.1 today.  Ultimate would like to get her under 7.0 but it is pretty close.  Just continue work on healthy diet and regular exercise      Relevant Medications   rosuvastatin (CRESTOR) 10 MG tablet (Start on 05/18/2019)   Other Relevant Orders   POCT HgB A1C (Completed)     Other   Hyperlipidemia    He has been able to take the Crestor twice a week so just encouraged her to continue with that and will update medication list.      Relevant Medications   rosuvastatin (CRESTOR) 10 MG tablet (Start on 05/18/2019)    Other Visit Diagnoses    Need for immunization against influenza       Relevant Orders   Flu Vaccine QUAD High Dose(Fluad) (Completed)   Need for Zostavax administration       Relevant Medications   Zoster Vaccine Adjuvanted [facility]) injection   Need for tetanus, diphtheria, and acellular pertussis (Tdap) vaccine in patient of adolescent age or older       Relevant Medications   Tdap ([place]) 5-2.5-18.5 LF-MCG/0.5 injection   Carpal tunnel syndrome of right wrist       Ear noise/buzzing, bilateral         Finger paresthesias in the right hand-possible carpal tunnel syndrome of right wrist-recommend a trial of wearing a cock-up splint at night only for the  next month to see if it is improving if not then please let us know.  Your noise/buzzing-sounds most consistent with tinnitus it sounds like it is chronic though it did start the summer.  I do not see any specific cause on your exam though I am not able to get a full evaluation of her right tympanic membrane.  Recommend that she work on using Debrox to clear out that right ear and if its not helping then please let us know and  will refer to ENT.  Splane that often times were not able to pinpoint a specific cause for tinnitus and there really is no cure.  She says its not very bothersome but she did want to mention it today.  Meds ordered this encounter  Medications  . Zoster Vaccine Adjuvanted [facility]) injection    Sig: Inject 0.5 mLs into the muscle once for 1 dose.    Dispense:  0.5 mL    Refill:  0  . Tdap (BOOSTRIX) 5-2.5-18.5 LF-MCG/0.5 injection    Sig: Inject 0.5 mLs into the muscle once for 1 dose.    Dispense:  0.5 mL    Refill:  0  . rosuvastatin (CRESTOR) 10 MG tablet    Sig: Take 1 tablet (10 mg total) by mouth 2 (two) times a week. At bedtime.    Dispense:  25 tablet    Refill:  3    Please d/c prior script for Crestor    Follow-up: Return in about 3 months (around 08/15/2019) for Diabetes follow-up.    Beatrice Lecher, MD

## 2019-05-15 NOTE — Patient Instructions (Signed)
Can use your Debrox particularly for your right ear.  If your ear buzzing is not improving over the next couple of weeks then let us know and we will plan to refer you to ENT.  Unfortunately most of the time there is no specific treatment for ear ringing.

## 2019-06-14 ENCOUNTER — Other Ambulatory Visit: Payer: Self-pay | Admitting: Family Medicine

## 2019-07-01 ENCOUNTER — Encounter: Payer: Self-pay | Admitting: Family Medicine

## 2019-08-11 ENCOUNTER — Other Ambulatory Visit: Payer: Self-pay | Admitting: Family Medicine

## 2019-08-17 ENCOUNTER — Ambulatory Visit: Payer: Medicare Other | Admitting: Family Medicine

## 2019-09-08 ENCOUNTER — Encounter: Payer: Self-pay | Admitting: Family Medicine

## 2019-09-08 ENCOUNTER — Ambulatory Visit (INDEPENDENT_AMBULATORY_CARE_PROVIDER_SITE_OTHER): Payer: Medicare Other | Admitting: Family Medicine

## 2019-09-08 ENCOUNTER — Other Ambulatory Visit: Payer: Self-pay

## 2019-09-08 VITALS — BP 127/57 | HR 63 | Ht 61.0 in | Wt 352.0 lb

## 2019-09-08 DIAGNOSIS — Z1231 Encounter for screening mammogram for malignant neoplasm of breast: Secondary | ICD-10-CM

## 2019-09-08 DIAGNOSIS — E785 Hyperlipidemia, unspecified: Secondary | ICD-10-CM

## 2019-09-08 DIAGNOSIS — E1165 Type 2 diabetes mellitus with hyperglycemia: Secondary | ICD-10-CM | POA: Diagnosis not present

## 2019-09-08 DIAGNOSIS — I10 Essential (primary) hypertension: Secondary | ICD-10-CM | POA: Diagnosis not present

## 2019-09-08 MED ORDER — ROSUVASTATIN CALCIUM 10 MG PO TABS: 10.0000 mg | ORAL_TABLET | ORAL | 0 refills | Status: DC

## 2019-09-08 NOTE — Assessment & Plan Note (Signed)
Due for repeat hemoglobin A1c.  Should go to the lab for this today since we were out of cartridges here in our office.  She is doing great and getting back into doing the recumbent bike.  The A1c is in the sixes this time.

## 2019-09-08 NOTE — Assessment & Plan Note (Signed)
Well controlled. Continue current regimen. Follow up in  3- 4 months.  Due for labs.

## 2019-09-08 NOTE — Assessment & Plan Note (Signed)
He had a decrease her to statin down to once a week.  We did discuss the alternative of trying a different statin, still may be taking it less than daily that certainly an option but she can let me know if she would like to do.

## 2019-09-08 NOTE — Progress Notes (Signed)
Established Patient Office Visit  Subjective:  Patient ID: Natalie Wright, female    DOB: 12/31/1950  Age: 69 y.o. MRN: [id number]  CC:  Chief Complaint  Patient presents with  . Diabetes  . Hypertension    HPI   Natalie Wright presents for   HTN, DM and hyperlipidemia. She has been doing well this past year with mostly staying and with Covid.  She has remained safe and done well.  Most of the time she does 35 units on her insulin but occasionally will do less particularly if she eats something like a salad because otherwise she will get some low blood sugars.  She has been getting back on her recumbent bike.  She did want to let me know that she had to decrease her statin down to once a week, from twice a week because she was getting some weakness in her knees and noticing she was having a hard time getting up out of the chair.  She says it took about 3 weeks but it did get better and again now she is doing her recumbent bike again.  She is due for her mammogram.  Reports that she did get her eye exam back in the summer so will call and get that I have report.  Past Medical History:  Diagnosis Date  . Allergy    to cats and dogs  . Diabetes mellitus   . Hiatal hernia    and delayed gastric emptying/ sees [facility] for recurrent diarrhea  . History of cardiovascular disorder 12/10/2008   Qualifier: Diagnosis of  By: Madilyn Fireman MD, Barnetta Chapel    . Hypertension   . OSA (obstructive sleep apnea)    CPAP 3mmhg  . Pedal edema   . Spastic bladder    urology in [place]  . TIA (transient ischemic attack)    hx of- sees Dr Elwin Mocha (Neurology)    Past Surgical History:  Procedure Laterality Date  . vaginal skin tag removal      Family History  Problem Relation Age of Onset  . Diabetes Mother   . Hypertension Mother   . Glaucoma Mother   . Cancer Mother        bone cancer  . Alcohol abuse Father   . Emphysema Father   . Other Father        CHF  . Ovarian cancer Sister      Social History   Socioeconomic History  . Marital status: Married    Spouse name: Clifton James  . Number of children: 2  . Years of education: Masters  . Highest education level: Master's degree (e.g., MA, MS, MEng, MEd, MSW, MBA)  Occupational History  . 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 Smoking status: Former Research scientist (life sciences)  . Smokeless tobacco: Never Used  Substance and Sexual Activity  . Alcohol use: Yes    Comment: social  . Drug use: No  . Sexual activity: Never  Other Topics Concern  . Not on file  Social History Narrative   Married to [place].  Retired and works 1 day awake for her daughter just to be out and about.   Social Determinants of Health   Financial Resource Strain: Low Risk   . Difficulty of Paying Living Expenses: Not hard at all  Food Insecurity: No Food Insecurity  . Worried About Charity fundraiser in the Last Year: Never true  . Ran Out of Food  in the Last Year: Never true  Transportation Needs: No Transportation Needs  . Lack of Transportation (Medical): No  . Lack of Transportation (Non-Medical): No  Physical Activity: Insufficiently Active  . Days of Exercise per Week: 5 days  . Minutes of Exercise per Session: 10 min  Stress: No Stress Concern Present  . Feeling of Stress : Not at all  Social Connections: Somewhat Isolated  . Frequency of Communication with Friends and Family: More than three times a week  . Frequency of Social Gatherings with Friends and Family: Once a week  . Attends Religious Services: Never  . Active Member of Clubs or Organizations: No  . Attends Archivist Meetings: Never  . Marital Status: Married  Human resources officer Violence: Not At Risk  . Fear of Current or Ex-Partner: No  . Emotionally Abused: No  . Physically Abused: No  . Sexually Abused: No    Outpatient Medications Prior to Visit  Medication Sig Dispense Refill  . acetaminophen (TYLENOL) 650 MG  CR tablet Take 650 mg by mouth every 8 (eight) hours as needed.      . AMBULATORY NON FORMULARY MEDICATION Medication Name: custom fit knee high compression stocking with 15-20 mmHg pressure. Open toe preferred. 1 vial 0  . Armodafinil 250 MG tablet Take by mouth.    Marland Kitchen aspirin 325 MG tablet Take 325 mg by mouth daily.    . cetirizine (ZYRTEC) 10 MG tablet Take 10 mg by mouth daily.      . chlorthalidone (HYGROTON) 25 MG tablet TAKE 1 TABLET DAILY 90 tablet 3  . clopidogrel (PLAVIX) 75 MG tablet TAKE 1 TABLET DAILY 90 tablet 3  . clotrimazole-betamethasone (LOTRISONE) cream Apply topically 2 (two) times daily as needed. 45 g 3  . diltiazem (CARDIZEM CD) 360 MG 24 hr capsule TAKE 1 CAPSULE(360 MG) BY MOUTH DAILY 90 capsule 0  . Dulaglutide (TRULICITY) 1.5 0000000 SOPN Inject 1.5 mg into the skin every 7 (seven) days. 12 pen 4  . Ferrous Sulfate Dried (FEOSOL) 200 (65 FE) MG TABS Take 1 tablet by mouth daily.      . fluconazole (DIFLUCAN) 150 MG tablet TAKE 1 TABLET BY MOUTH ONCE 7 tablet 3  . insulin glargine (LANTUS) 100 UNIT/ML injection Inject 0.35 mLs (35 Units total) into the skin daily. INJECT 35 UNITS UNDER THE SKIN DAILY (Patient taking differently: Inject 30 Units into the skin daily. INJECT 30 UNITS UNDER THE SKIN DAILY) 45 mL 3  . Insulin Pen Needle (NOVOFINE) 30G X 8 MM MISC For use each time injection for insulin 100 each 4  . JARDIANCE 25 MG TABS tablet TAKE 1 TABLET DAILY 90 tablet 3  . lactase (LACTAID) 3000 UNITS tablet Take 3,000 Units by mouth as needed.     . latanoprost (XALATAN) 0.005 % ophthalmic solution 1 drop.    Marland Kitchen lisinopril (ZESTRIL) 20 MG tablet TAKE 1 TABLET DAILY 90 tablet 3  . meclizine (ANTIVERT) 25 MG tablet Take 1 tablet (25 mg total) by mouth 3 (three) times daily as needed for dizziness. 90 tablet 1  . metFORMIN (GLUCOPHAGE) 1000 MG tablet TAKE 1 TABLET TWICE A DAY WITH MEALS 180 tablet 3  . Misc. Devices MISC Transfer of care to [facility].  Please provide  service to her current CPAP machine for OSA at 14 cm. water pressure.  Send directly to [place] at [facility].    . Omega-3 Fatty Acids (FISH OIL) 1200 MG CAPS Take 1 capsule by mouth daily.    Marland Kitchen  pantoprazole (PROTONIX) 40 MG tablet TAKE 1 TABLET DAILY 90 tablet 3  . AMBULATORY NON FORMULARY MEDICATION Medication Name: please service CPAP machine or provide new CPAP machine if needed. Set on auto Pap for 1 month and then do a download to see if her pressure needs to be readjusted. Dx code: G47.30 (sleep apnea) FAX: [phone]; PHONE: [phone] Areoflow 1 Units 0  . AMBULATORY NON FORMULARY MEDICATION Medication Name: Please service her CPAP machine.  Humidifier not working and too much suction on the mask.  FAXQF:[id number]; PHONE: [phone] Areoflow 1 vial 0  . rosuvastatin (CRESTOR) 10 MG tablet Take 1 tablet (10 mg total) by mouth 2 (two) times a week. At bedtime. (Patient taking differently: Take 10 mg by mouth once a week. At bedtime.) 25 tablet 3  . sucralfate (CARAFATE) 1 GM/10ML suspension Take 10 mLs (1 g total) by mouth 4 (four) times daily -  with meals and at bedtime. (Patient not taking: Reported on 03/10/2019) 420 mL 0   No facility-administered medications prior to visit.    Allergies  Allergen Reactions  . Atorvastatin Other (See Comments)    Patient stated muscle weakness Patient stated muscle weakness  . Beclomethasone Other (See Comments)    unknown unknown  . Cefaclor Hives  . Pioglitazone Other (See Comments)    Mouth sores Mouth sores Mouth sores unknown REACTION: ulcers in the mouth  . Pravastatin Other (See Comments)    Chest pain.  Chest pain.   . Sulfa Antibiotics Hives    Other reaction(s): Agitation  . Cephalexin   . Cephalexin Other (See Comments)    unknown  . Lipitor [Atorvastatin Calcium] Other (See Comments)    myalgias  . Statins Other (See Comments)    Muscle pain Other reaction(s): Other (See Comments) Muscle pain    ROS Review  of Systems    Objective:    Physical Exam  Constitutional: She is oriented to person, place, and time. She appears well-developed and well-nourished.  HENT:  Head: Normocephalic and atraumatic.  Cardiovascular: Normal rate, regular rhythm and normal heart sounds.  Pulmonary/Chest: Effort normal and breath sounds normal.  Neurological: She is alert and oriented to person, place, and time.  Skin: Skin is warm and dry.  Psychiatric: She has a normal mood and affect. Her behavior is normal.    BP (!) 127/57   Pulse 63   Ht 5\' 1"  (1.549 m)   Wt (!) 352 lb (159.7 kg)   SpO2 99%   BMI 66.51 kg/m  Wt Readings from Last 3 Encounters:  09/08/19 (!) 352 lb (159.7 kg)  05/15/19 (!) 357 lb (161.9 kg)  03/10/19 (!) 348 lb (157.9 kg)     Health Maintenance Due  Topic Date Due  . OPHTHALMOLOGY EXAM  05/22/2019    There are no preventive care reminders to display for this patient.  Lab Results  Component Value Date   TSH 1.72 08/19/2018   Lab Results  Component Value Date   WBC 8.0 08/19/2018   HGB 14.4 08/19/2018   HCT 44.2 08/19/2018   MCV 86.2 08/19/2018   PLT 346 08/19/2018   Lab Results  Component Value Date   NA 139 08/19/2018   K 3.9 08/19/2018   CO2 31 08/19/2018   GLUCOSE 115 08/19/2018   BUN 16 08/19/2018   CREATININE 0.99 08/19/2018   BILITOT 0.5 08/19/2018   ALKPHOS 71 06/08/2016   AST 14 08/19/2018   ALT 12 08/19/2018   PROT 6.8 08/19/2018  ALBUMIN 4.3 06/08/2016   CALCIUM 9.5 08/19/2018   ANIONGAP 7 05/09/2015   Lab Results  Component Value Date   CHOL 187 08/19/2018   Lab Results  Component Value Date   HDL 52 08/19/2018   Lab Results  Component Value Date   LDLCALC 115 (H) 08/19/2018   Lab Results  Component Value Date   TRIG 95 08/19/2018   Lab Results  Component Value Date   CHOLHDL 3.6 08/19/2018   Lab Results  Component Value Date   HGBA1C 7.1 (A) 05/15/2019      Assessment & Plan:   Problem List Items Addressed This  Visit      Cardiovascular and Mediastinum   Essential hypertension, benign - Primary    Well controlled. Continue current regimen. Follow up in  3- 4 months.  Due for labs.       Relevant Medications   rosuvastatin (CRESTOR) 10 MG tablet   Other Relevant Orders   HgB A1c   TSH   Lipid panel   COMPLETE METABOLIC PANEL WITH GFR     Endocrine   Diabetes type 2, uncontrolled ([place])    Due for repeat hemoglobin A1c.  Should go to the lab for this today since we were out of cartridges here in our office.  She is doing great and getting back into doing the recumbent bike.  The A1c is in the sixes this time.      Relevant Medications   rosuvastatin (CRESTOR) 10 MG tablet   Other Relevant Orders   HgB A1c   TSH   Lipid panel   COMPLETE METABOLIC PANEL WITH GFR     Other   Hyperlipidemia    He had a decrease her to statin down to once a week.  We did discuss the alternative of trying a different statin, still may be taking it less than daily that certainly an option but she can let me know if she would like to do.      Relevant Medications   rosuvastatin (CRESTOR) 10 MG tablet   Other Relevant Orders   HgB A1c   TSH   Lipid panel   COMPLETE METABOLIC PANEL WITH GFR    Other Visit Diagnoses    Screening mammogram, encounter for       Relevant Orders   MM 3D SCREEN BREAST BILATERAL     For screening mammogram.  Order placed.  Will call from [place] up-to-date copy of eye exam.  Meds ordered this encounter  Medications  . rosuvastatin (CRESTOR) 10 MG tablet    Sig: Take 1 tablet (10 mg total) by mouth once a week. At bedtime.    Refill:  0    Follow-up: Return in about 4 months (around 01/08/2020) for Diabetes follow-up, Hypertension.    Beatrice Lecher, MD

## 2019-09-17 ENCOUNTER — Encounter: Payer: Self-pay | Admitting: Family Medicine

## 2019-09-17 DIAGNOSIS — H40003 Preglaucoma, unspecified, bilateral: Secondary | ICD-10-CM | POA: Diagnosis not present

## 2019-09-17 LAB — HM DIABETES EYE EXAM

## 2019-09-22 DIAGNOSIS — I10 Essential (primary) hypertension: Secondary | ICD-10-CM | POA: Diagnosis not present

## 2019-09-22 DIAGNOSIS — E785 Hyperlipidemia, unspecified: Secondary | ICD-10-CM | POA: Diagnosis not present

## 2019-09-22 DIAGNOSIS — E1165 Type 2 diabetes mellitus with hyperglycemia: Secondary | ICD-10-CM | POA: Diagnosis not present

## 2019-09-23 LAB — COMPLETE METABOLIC PANEL WITH GFR
AG Ratio: 1.6 (calc) (ref 1.0–2.5)
ALT: 12 U/L (ref 6–29)
AST: 15 U/L (ref 10–35)
Albumin: 4.2 g/dL (ref 3.6–5.1)
Alkaline phosphatase (APISO): 81 U/L (ref 37–153)
BUN: 16 mg/dL (ref 7–25)
CO2: 31 mmol/L (ref 20–32)
Calcium: 9.7 mg/dL (ref 8.6–10.4)
Chloride: 101 mmol/L (ref 98–110)
Creat: 0.83 mg/dL (ref 0.50–0.99)
GFR, Est African American: 84 mL/min/{1.73_m2} (ref >=60)
GFR, Est Non African American: 72 mL/min/{1.73_m2} (ref >=60)
Globulin: 2.6 g/dL (calc) (ref 1.9–3.7)
Glucose, Bld: 119 mg/dL (ref 65–139)
Potassium: 4.2 mmol/L (ref 3.5–5.3)
Sodium: 141 mmol/L (ref 135–146)
Total Bilirubin: 0.4 mg/dL (ref 0.2–1.2)
Total Protein: 6.8 g/dL (ref 6.1–8.1)

## 2019-09-23 LAB — HEMOGLOBIN A1C
Hgb A1c MFr Bld: 7.9 % of total Hgb — ABNORMAL HIGH (ref <5.7)
Mean Plasma Glucose: 180 (calc)
eAG (mmol/L): 10 (calc)

## 2019-09-23 LAB — LIPID PANEL
Cholesterol: 163 mg/dL (ref <200)
HDL: 52 mg/dL (ref >=50)
LDL Cholesterol (Calc): 90 mg/dL (calc)
Non-HDL Cholesterol (Calc): 111 mg/dL (calc) (ref <130)
Total CHOL/HDL Ratio: 3.1 (calc) (ref <5.0)
Triglycerides: 109 mg/dL (ref <150)

## 2019-09-23 LAB — TSH: TSH: 2.28 mIU/L (ref 0.40–4.50)

## 2019-11-05 ENCOUNTER — Ambulatory Visit (INDEPENDENT_AMBULATORY_CARE_PROVIDER_SITE_OTHER): Payer: Medicare Other

## 2019-11-05 ENCOUNTER — Other Ambulatory Visit: Payer: Self-pay

## 2019-11-05 DIAGNOSIS — Z1231 Encounter for screening mammogram for malignant neoplasm of breast: Secondary | ICD-10-CM

## 2019-11-10 ENCOUNTER — Other Ambulatory Visit: Payer: Self-pay | Admitting: Family Medicine

## 2019-11-13 ENCOUNTER — Telehealth: Payer: Self-pay | Admitting: Family Medicine

## 2019-11-13 NOTE — Telephone Encounter (Signed)
Call pt: due for colon ca screening.  See if willing to do one of options for screening.

## 2019-11-16 NOTE — Telephone Encounter (Signed)
Routing to assistant

## 2019-11-24 NOTE — Telephone Encounter (Signed)
lvm asking pt to rtn call on whether she would like a referral, cologuard, or stool cards.

## 2019-11-25 NOTE — Telephone Encounter (Signed)
LVM asking that she either call or send my chart about how to proceed.

## 2019-11-26 ENCOUNTER — Encounter: Payer: Self-pay | Admitting: Family Medicine

## 2019-11-27 ENCOUNTER — Other Ambulatory Visit: Payer: Self-pay | Admitting: *Deleted

## 2019-11-27 DIAGNOSIS — Z1211 Encounter for screening for malignant neoplasm of colon: Secondary | ICD-10-CM

## 2019-11-27 NOTE — Telephone Encounter (Signed)
cologuard ordered.

## 2019-12-11 DIAGNOSIS — Z1211 Encounter for screening for malignant neoplasm of colon: Secondary | ICD-10-CM | POA: Diagnosis not present

## 2019-12-17 LAB — COLOGUARD
COLOGUARD: NEGATIVE
Cologuard: NEGATIVE

## 2019-12-17 LAB — EXTERNAL GENERIC LAB PROCEDURE: COLOGUARD: NEGATIVE

## 2019-12-18 ENCOUNTER — Telehealth: Payer: Self-pay | Admitting: Family Medicine

## 2019-12-18 NOTE — Telephone Encounter (Signed)
Call patient and let her know that her Cologuard test result was negative which is fantastic.  Recommend repeat colon cancer screening in 3 years.

## 2020-01-06 NOTE — Telephone Encounter (Signed)
Pt advised of results. 

## 2020-01-07 ENCOUNTER — Ambulatory Visit: Payer: Medicare Other | Admitting: Family Medicine

## 2020-01-29 ENCOUNTER — Ambulatory Visit (INDEPENDENT_AMBULATORY_CARE_PROVIDER_SITE_OTHER): Payer: Medicare Other | Admitting: Family Medicine

## 2020-01-29 ENCOUNTER — Other Ambulatory Visit: Payer: Self-pay

## 2020-01-29 ENCOUNTER — Encounter: Payer: Self-pay | Admitting: Family Medicine

## 2020-01-29 VITALS — BP 122/55 | HR 61 | Ht 61.0 in | Wt 349.0 lb

## 2020-01-29 DIAGNOSIS — L84 Corns and callosities: Secondary | ICD-10-CM | POA: Diagnosis not present

## 2020-01-29 DIAGNOSIS — I10 Essential (primary) hypertension: Secondary | ICD-10-CM

## 2020-01-29 DIAGNOSIS — I700 Atherosclerosis of aorta: Secondary | ICD-10-CM | POA: Diagnosis not present

## 2020-01-29 DIAGNOSIS — E1165 Type 2 diabetes mellitus with hyperglycemia: Secondary | ICD-10-CM | POA: Diagnosis not present

## 2020-01-29 DIAGNOSIS — L280 Lichen simplex chronicus: Secondary | ICD-10-CM

## 2020-01-29 LAB — POCT GLYCOSYLATED HEMOGLOBIN (HGB A1C): Hemoglobin A1C: 6.9 % — AB (ref 4.0–5.6)

## 2020-01-29 MED ORDER — CLOBETASOL PROPIONATE 0.05 % EX CREA: 1.0000 "application " | TOPICAL_CREAM | Freq: Two times a day (BID) | CUTANEOUS | 1 refills | Status: DC

## 2020-01-29 NOTE — Assessment & Plan Note (Addendum)
She has evidence of preulcerative calluses on the lateral part of the right foot and more medially close to the arch.  No active ulcerations or drainage currently.  Discussed care including moisturizing well.  Okay to do salt water soaks.  And avoid wearing shoes that cause a lot of irritation and friction.  Also discussed that she would be a candidate for diabetic shoes.

## 2020-01-29 NOTE — Assessment & Plan Note (Signed)
Continue statin therapy.

## 2020-01-29 NOTE — Assessment & Plan Note (Addendum)
Blood pressure looks fantastic today.  Continue current regimen.  She has had a little bit of dizziness that she feels is likely her positional vertigo but did encourage her to check her blood pressure if it happens again just to make sure it is not going too low.

## 2020-01-29 NOTE — Progress Notes (Signed)
Established Patient Office Visit  Subjective:  Patient ID: Natalie Wright, female    DOB: 1951-02-11  Age: 69 y.o. MRN: [id number]  CC:  Chief Complaint  Patient presents with  . Diabetes    HPI Natalie Wright presents for   Diabetes - no hypoglycemic events. No wounds or sores that are not healing well. No increased thirst or urination. Checking glucose at home. Taking medications as prescribed without any side effects.  She did want me to take a look at her right foot today.  She said she wore a pair of shoes around the end of May or early June that actually caused to ulcerations to show up on her foot.  She has been putting some type of over-the-counter ointment on it and keeping it covered she does wear compression socks.  She did want me to take a look at today.  She has not noticed any active drainage and no fever or chills.  She feels like the redness and irritation has actually improved.  She also wanted to let me know that her dentist had noticed a lesion on her gums that they have been monitoring.  She told her about her history of lichen simplex chronicus she has a couple areas on her skin that always stay a little irritated and thickened and have had biopsies in the past.  She says that her dentist is actually reaching out to the dermatologist who did the biopsies.  She did want to tell me about a particular area right at the top of the buttocks crease.  She says about once a year will flareup and get really thick and then scabs she thinks part of it is because of the skin pulling at the buttock area at night while she sleeping.  She says at least once a year it flares up and then takes sometimes months for it to heal she wants to know if there is any cream or treatment that she might be able to use when it flares again.  Is also had some dizziness on and off with a little bit of nausea she felt like it was probably her vertigo.  Is been going on for about 1-1/2 weeks but is a  little better she has been taking her Antivert she has not noticed any chest pain or shortness of breath or recent onset swelling.  Past Medical History:  Diagnosis Date  . Allergy    to cats and dogs  . Diabetes mellitus   . Hiatal hernia    and delayed gastric emptying/ sees [facility] for recurrent diarrhea  . History of cardiovascular disorder 12/10/2008   Qualifier: Diagnosis of  By: Madilyn Fireman MD, Barnetta Chapel    . Hypertension   . OSA (obstructive sleep apnea)    CPAP 53mmhg  . Pedal edema   . Spastic bladder    urology in [place]  . TIA (transient ischemic attack)    hx of- sees Dr Elwin Mocha (Neurology)    Past Surgical History:  Procedure Laterality Date  . vaginal skin tag removal      Family History  Problem Relation Age of Onset  . Diabetes Mother   . Hypertension Mother   . Glaucoma Mother   . Cancer Mother        bone cancer  . Alcohol abuse Father   . Emphysema Father   . Other Father        CHF  . Ovarian cancer Sister     Social History  Socioeconomic History  . Marital status: Married    Spouse name: Clifton James  . Number of children: 2  . Years of education: Masters  . Highest education level: Master's degree (e.g., MA, MS, MEng, MEd, MSW, MBA)  Occupational History  . 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 Smoking status: Former Research scientist (life sciences)  . Smokeless tobacco: Never Used  Vaping Use  . Vaping Use: Never used  Substance and Sexual Activity  . Alcohol use: Yes    Comment: social  . Drug use: No  . Sexual activity: Never  Other Topics Concern  . Not on file  Social History Narrative   Married to [place].  Retired and works 1 day awake for her daughter just to be out and about.   Social Determinants of Health   Financial Resource Strain: Low Risk   . Difficulty of Paying Living Expenses: Not hard at all  Food Insecurity: No Food Insecurity  . Worried About Charity fundraiser in the Last Year: Never  true  . Ran Out of Food in the Last Year: Never true  Transportation Needs: No Transportation Needs  . Lack of Transportation (Medical): No  . Lack of Transportation (Non-Medical): No  Physical Activity: Insufficiently Active  . Days of Exercise per Week: 5 days  . Minutes of Exercise per Session: 10 min  Stress: No Stress Concern Present  . Feeling of Stress : Not at all  Social Connections: Moderately Isolated  . Frequency of Communication with Friends and Family: More than three times a week  . Frequency of Social Gatherings with Friends and Family: Once a week  . Attends Religious Services: Never  . Active Member of Clubs or Organizations: No  . Attends Archivist Meetings: Never  . Marital Status: Married  Human resources officer Violence: Not At Risk  . Fear of Current or Ex-Partner: No  . Emotionally Abused: No  . Physically Abused: No  . Sexually Abused: No    Outpatient Medications Prior to Visit  Medication Sig Dispense Refill  . acetaminophen (TYLENOL) 650 MG CR tablet Take 650 mg by mouth every 8 (eight) hours as needed.      . AMBULATORY NON FORMULARY MEDICATION Medication Name: custom fit knee high compression stocking with 15-20 mmHg pressure. Open toe preferred. 1 vial 0  . Armodafinil 250 MG tablet Take by mouth.    Marland Kitchen aspirin 325 MG tablet Take 325 mg by mouth daily.    . cetirizine (ZYRTEC) 10 MG tablet Take 10 mg by mouth daily.      . chlorthalidone (HYGROTON) 25 MG tablet TAKE 1 TABLET DAILY 90 tablet 3  . clopidogrel (PLAVIX) 75 MG tablet TAKE 1 TABLET DAILY 90 tablet 3  . diltiazem (CARDIZEM CD) 360 MG 24 hr capsule TAKE 1 CAPSULE(360 MG) BY MOUTH DAILY 90 capsule 0  . Ferrous Sulfate Dried (FEOSOL) 200 (65 FE) MG TABS Take 1 tablet by mouth daily.      . insulin glargine (LANTUS) 100 UNIT/ML injection Inject 0.35 mLs (35 Units total) into the skin daily. INJECT 35 UNITS UNDER THE SKIN DAILY (Patient taking differently: Inject 30 Units into the skin daily.  INJECT 30 UNITS UNDER THE SKIN DAILY) 45 mL 3  . Insulin Pen Needle (NOVOFINE) 30G X 8 MM MISC For use each time injection for insulin 100 each 4  . JARDIANCE 25 MG TABS tablet TAKE 1 TABLET DAILY 90 tablet 3  .  lactase (LACTAID) 3000 UNITS tablet Take 3,000 Units by mouth as needed.     . latanoprost (XALATAN) 0.005 % ophthalmic solution 1 drop.    Marland Kitchen lisinopril (ZESTRIL) 20 MG tablet TAKE 1 TABLET DAILY 90 tablet 3  . meclizine (ANTIVERT) 25 MG tablet Take 1 tablet (25 mg total) by mouth 3 (three) times daily as needed for dizziness. 90 tablet 1  . metFORMIN (GLUCOPHAGE) 1000 MG tablet TAKE 1 TABLET TWICE A DAY WITH MEALS 180 tablet 3  . Misc. Devices MISC Transfer of care to [facility].  Please provide service to her current CPAP machine for OSA at 14 cm. water pressure.  Send directly to [facility] at [facility].    . Omega-3 Fatty Acids (FISH OIL) 1200 MG CAPS Take 1 capsule by mouth daily.    . pantoprazole (PROTONIX) 40 MG tablet TAKE 1 TABLET DAILY 90 tablet 3  . rosuvastatin (CRESTOR) 10 MG tablet Take 1 tablet (10 mg total) by mouth once a week. At bedtime.  0  . TRULICITY 1.5 WN/0.2VO SOPN INJECT 1.5 MG UNDER THE SKIN EVERY 7 DAYS 6 mL 3  . fluconazole (DIFLUCAN) 150 MG tablet TAKE 1 TABLET BY MOUTH ONCE (Patient not taking: Reported on 01/29/2020) 7 tablet 3   No facility-administered medications prior to visit.    Allergies  Allergen Reactions  . Atorvastatin Other (See Comments)    Patient stated muscle weakness Patient stated muscle weakness  . Beclomethasone Other (See Comments)    unknown unknown  . Cefaclor Hives  . Pioglitazone Other (See Comments)    Mouth sores Mouth sores Mouth sores unknown REACTION: ulcers in the mouth  . Pravastatin Other (See Comments)    Chest pain.  Chest pain.   . Sulfa Antibiotics Hives    Other reaction(s): Agitation  . Cephalexin   . Cephalexin Other (See Comments)    unknown  . Lipitor [Atorvastatin Calcium] Other (See Comments)     myalgias  . Statins Other (See Comments)    Muscle pain Other reaction(s): Other (See Comments) Muscle pain    ROS Review of Systems    Objective:    Physical Exam Constitutional:      Appearance: She is well-developed.  HENT:     Head: Normocephalic and atraumatic.  Cardiovascular:     Rate and Rhythm: Normal rate and regular rhythm.     Heart sounds: Normal heart sounds.  Pulmonary:     Effort: Pulmonary effort is normal.     Breath sounds: Normal breath sounds.  Musculoskeletal:     Comments: On her right distal foot she has about 3 calluses on that right lateral foot near the distal fifth metatarsal head.  She also has 1 over the lateral bottom of the foot towards the arch in the area of the mid fifth metatarsal.  No open wounds or drainage.  No significant induration or erythema.  Skin:    General: Skin is warm and dry.  Neurological:     Mental Status: She is alert and oriented to person, place, and time.  Psychiatric:        Behavior: Behavior normal.     BP (!) 122/55   Pulse 61   Ht 5\' 1"  (1.549 m)   Wt (!) 349 lb (158.3 kg)   SpO2 98%   BMI 65.94 kg/m  Wt Readings from Last 3 Encounters:  01/29/20 (!) 349 lb (158.3 kg)  09/08/19 (!) 352 lb (159.7 kg)  05/15/19 (!) 357 lb (161.9 kg)  There are no preventive care reminders to display for this patient.  There are no preventive care reminders to display for this patient.  Lab Results  Component Value Date   TSH 2.28 09/22/2019   Lab Results  Component Value Date   WBC 8.0 08/19/2018   HGB 14.4 08/19/2018   HCT 44.2 08/19/2018   MCV 86.2 08/19/2018   PLT 346 08/19/2018   Lab Results  Component Value Date   NA 141 09/22/2019   K 4.2 09/22/2019   CO2 31 09/22/2019   GLUCOSE 119 09/22/2019   BUN 16 09/22/2019   CREATININE 0.83 09/22/2019   BILITOT 0.4 09/22/2019   ALKPHOS 71 06/08/2016   AST 15 09/22/2019   ALT 12 09/22/2019   PROT 6.8 09/22/2019   ALBUMIN 4.3 06/08/2016   CALCIUM  9.7 09/22/2019   ANIONGAP 7 05/09/2015   Lab Results  Component Value Date   CHOL 163 09/22/2019   Lab Results  Component Value Date   HDL 52 09/22/2019   Lab Results  Component Value Date   LDLCALC 90 09/22/2019   Lab Results  Component Value Date   TRIG 109 09/22/2019   Lab Results  Component Value Date   CHOLHDL 3.1 09/22/2019   Lab Results  Component Value Date   HGBA1C 6.9 (A) 01/29/2020      Assessment & Plan:   Problem List Items Addressed This Visit      Cardiovascular and Mediastinum   Essential hypertension, benign    Blood pressure looks fantastic today.  Continue current regimen.  She has had a little bit of dizziness that she feels is likely her positional vertigo but did encourage her to check her blood pressure if it happens again just to make sure it is not going too low.      Aortic atherosclerosis (HCC)    Continue statin therapy.        Endocrine   Diabetes type 2, uncontrolled ([place]) - Primary    See much improved.  Down to 6.9, from previous of 7.9.  Just congratulated her for some of the changes that she has made.  Over Covid she had started drinking soda again and eating sweets and has really worked hard to cut that back out of her diet.  On ACE inhibitor and statin.      Relevant Orders   POCT glycosylated hemoglobin (Hb A1C) (Completed)     Musculoskeletal and Integument   Pre-ulcerative corn or callous    She has evidence of preulcerative calluses on the lateral part of the right foot and more medially close to the arch.  No active ulcerations or drainage currently.  Discussed care including moisturizing well.  Okay to do salt water soaks.  And avoid wearing shoes that cause a lot of irritation and friction.  Also discussed that she would be a candidate for diabetic shoes.      Lichenoid dermatitis    A new lesion in her mouth is being followed by the dentist.  Also send over clobetasol cream to use on the area on her buttocks.       Relevant Medications   clobetasol cream (TEMOVATE) 0.05 %      Meds ordered this encounter  Medications  . clobetasol cream (TEMOVATE) 0.05 %    Sig: Apply 1 application topically 2 (two) times daily.    Dispense:  45 g    Refill:  1    Follow-up: Return in about 3 months (around 04/30/2020) for Diabetes  follow-up.   Time spent 35 minutes in encounter  Beatrice Lecher, MD

## 2020-01-29 NOTE — Assessment & Plan Note (Addendum)
See much improved.  Down to 6.9, from previous of 7.9.  Just congratulated her for some of the changes that she has made.  Over Covid she had started drinking soda again and eating sweets and has really worked hard to cut that back out of her diet.  On ACE inhibitor and statin.

## 2020-01-29 NOTE — Assessment & Plan Note (Signed)
A new lesion in her mouth is being followed by the dentist.  Also send over clobetasol cream to use on the area on her buttocks.

## 2020-02-29 ENCOUNTER — Other Ambulatory Visit: Payer: Self-pay | Admitting: Family Medicine

## 2020-03-15 DIAGNOSIS — E119 Type 2 diabetes mellitus without complications: Secondary | ICD-10-CM | POA: Diagnosis not present

## 2020-03-15 DIAGNOSIS — H02834 Dermatochalasis of left upper eyelid: Secondary | ICD-10-CM | POA: Diagnosis not present

## 2020-03-15 DIAGNOSIS — H02831 Dermatochalasis of right upper eyelid: Secondary | ICD-10-CM | POA: Diagnosis not present

## 2020-03-15 DIAGNOSIS — H43813 Vitreous degeneration, bilateral: Secondary | ICD-10-CM | POA: Diagnosis not present

## 2020-03-15 DIAGNOSIS — H40003 Preglaucoma, unspecified, bilateral: Secondary | ICD-10-CM | POA: Diagnosis not present

## 2020-03-15 DIAGNOSIS — H527 Unspecified disorder of refraction: Secondary | ICD-10-CM | POA: Diagnosis not present

## 2020-03-15 LAB — HM DIABETES EYE EXAM

## 2020-03-25 DIAGNOSIS — I10 Essential (primary) hypertension: Secondary | ICD-10-CM | POA: Diagnosis not present

## 2020-03-25 DIAGNOSIS — I471 Supraventricular tachycardia: Secondary | ICD-10-CM | POA: Diagnosis not present

## 2020-03-25 DIAGNOSIS — I491 Atrial premature depolarization: Secondary | ICD-10-CM | POA: Diagnosis not present

## 2020-04-15 ENCOUNTER — Telehealth: Payer: Self-pay | Admitting: Family Medicine

## 2020-04-15 ENCOUNTER — Other Ambulatory Visit: Payer: Self-pay | Admitting: *Deleted

## 2020-04-15 MED ORDER — LISINOPRIL 20 MG PO TABS: 20.0000 mg | ORAL_TABLET | Freq: Every day | ORAL | 3 refills | Status: DC

## 2020-04-15 NOTE — Telephone Encounter (Signed)
Medication sent.

## 2020-04-15 NOTE — Telephone Encounter (Signed)
Patient requested refill of l[facility]ril [facility]

## 2020-04-18 MED ORDER — LISINOPRIL 20 MG PO TABS: 20.0000 mg | ORAL_TABLET | Freq: Every day | ORAL | 3 refills | Status: DC

## 2020-04-18 NOTE — Addendum Note (Signed)
Addended by: Narda Rutherford on: 04/18/2020 06:57 AM   Modules accepted: Orders

## 2020-05-04 ENCOUNTER — Ambulatory Visit: Payer: Medicare Other | Admitting: Family Medicine

## 2020-05-05 ENCOUNTER — Encounter: Payer: Self-pay | Admitting: Family Medicine

## 2020-05-05 ENCOUNTER — Ambulatory Visit (INDEPENDENT_AMBULATORY_CARE_PROVIDER_SITE_OTHER): Payer: Medicare Other | Admitting: Family Medicine

## 2020-05-05 VITALS — BP 114/38 | HR 49 | Ht 61.0 in | Wt 348.0 lb

## 2020-05-05 DIAGNOSIS — E1165 Type 2 diabetes mellitus with hyperglycemia: Secondary | ICD-10-CM | POA: Diagnosis not present

## 2020-05-05 DIAGNOSIS — R251 Tremor, unspecified: Secondary | ICD-10-CM | POA: Diagnosis not present

## 2020-05-05 DIAGNOSIS — L84 Corns and callosities: Secondary | ICD-10-CM

## 2020-05-05 DIAGNOSIS — I10 Essential (primary) hypertension: Secondary | ICD-10-CM | POA: Diagnosis not present

## 2020-05-05 DIAGNOSIS — Z23 Encounter for immunization: Secondary | ICD-10-CM

## 2020-05-05 LAB — POCT GLYCOSYLATED HEMOGLOBIN (HGB A1C): Hemoglobin A1C: 7 % — AB (ref 4.0–5.6)

## 2020-05-05 NOTE — Assessment & Plan Note (Signed)
A1c back up to 7.0.  Just encouraged her to continue to work on healthy diet and regular exercise and continue to use her insulin and Trulicity regularly.  Follow-up in 3 months.

## 2020-05-05 NOTE — Assessment & Plan Note (Addendum)
Discussed referral to podiatry for further evaluation.  Unfortunately the inserts that she purchased are not helpful. She will need custom shoe and inserts because of her diabetes.

## 2020-05-05 NOTE — Assessment & Plan Note (Signed)
Unclear etiology but we did discuss some differences between essential tremor and Parkinson's disease at this point I do not see any indication for Parkinson's anoxia resting tremor on exam I did encourage her to try to recorded if she can 2nd see it.  She really has not had any changes in medication regimen to suspect a medication side effect with it still possible.  Keep an eye on it

## 2020-05-05 NOTE — Progress Notes (Addendum)
Established Patient Office Visit  Subjective:  Patient ID: Natalie Wright, female    DOB: [birth year]  Age: 69 y.o. MRN: [id number]  CC:  Chief Complaint  Patient presents with  . Diabetes    HPI Natalie Wright presents for 3 month f/u   Diabetes - no hypoglycemic events. No wounds or sores that are not healing well. No increased thirst or urination. Checking glucose at home. Taking medications as prescribed without any side effects.  She does report that she actually ran out of her insulin for about a week but is back on it.  Hypertension- Pt denies chest pain, SOB, dizziness, or heart palpitations.  Taking meds as directed w/o problems.  Denies medication side effects.    Still dealing with some calloses on her feet.  She went to the [facility] store and spent 1200 got some inserts.  She says they really uncomfortable so she has not been wearing them.  Her knee arthritis she has been using some CBD cream and she does feel like it is actually been helpful.  She also reports a tremor she has noticed it mostly in her right hand it comes and goes and usually just lasts for a few seconds when it happens but she notices it about 5 out of 7 days a week.  She has not noticed any specific triggers or patterns or timing of day.  Does it started in the last 3 months.  She has not had any new medication changes.  She said she started the CBD cream after the tremor had already started.  We did not adjust or go up on any of her medicines.     Past Medical History:  Diagnosis Date  . Allergy    to cats and dogs  . Diabetes mellitus   . Hiatal hernia    and delayed gastric emptying/ sees [facility] for recurrent diarrhea  . History of cardiovascular disorder 12/10/2008   Qualifier: Diagnosis of  By: Madilyn Fireman MD, Barnetta Chapel    . Hypertension   . OSA (obstructive sleep apnea)    CPAP 21mmhg  . Pedal edema   . Spastic bladder    urology in [place]  . TIA (transient ischemic attack)    hx of- sees Dr  Elwin Mocha (Neurology)    Past Surgical History:  Procedure Laterality Date  . vaginal skin tag removal      Family History  Problem Relation Age of Onset  . Diabetes Mother   . Hypertension Mother   . Glaucoma Mother   . Cancer Mother        bone cancer  . Alcohol abuse Father   . Emphysema Father   . Other Father        CHF  . Ovarian cancer Sister     Social History   Socioeconomic History  . Marital status: Married    Spouse name: Clifton James  . Number of children: 2  . Years of education: Masters  . Highest education level: Master's degree (e.g., MA, MS, MEng, MEd, MSW, MBA)  Occupational History  . 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 Smoking status: Former Research scientist (life sciences)  . Smokeless tobacco: Never Used  Vaping Use  . Vaping Use: Never used  Substance and Sexual Activity  . Alcohol use: Yes    Comment: social  . Drug use: No  . Sexual activity: Never  Other Topics Concern  . Not on file  Social History Narrative   Married to [place].  Retired and works 1 day awake for her daughter just to be out and about.   Social Determinants of Health   Financial Resource Strain: Not on file  Food Insecurity: Not on file  Transportation Needs: Not on file  Physical Activity: Not on file  Stress: Not on file  Social Connections: Not on file  Intimate Partner Violence: Not on file    Outpatient Medications Prior to Visit  Medication Sig Dispense Refill  . acetaminophen (TYLENOL) 650 MG CR tablet Take 650 mg by mouth every 8 (eight) hours as needed.      . AMBULATORY NON FORMULARY MEDICATION Medication Name: custom fit knee high compression stocking with 15-20 mmHg pressure. Open toe preferred. 1 vial 0  . Armodafinil 250 MG tablet Take by mouth.    Marland Kitchen aspirin 325 MG tablet Take 325 mg by mouth daily.    . cetirizine (ZYRTEC) 10 MG tablet Take 10 mg by mouth daily.      . chlorthalidone (HYGROTON) 25 MG tablet TAKE 1  TABLET DAILY 90 tablet 3  . clobetasol cream (TEMOVATE) 0.25 % Apply 1 application topically 2 (two) times daily. 45 g 1  . clopidogrel (PLAVIX) 75 MG tablet TAKE 1 TABLET DAILY 90 tablet 3  . diltiazem (CARDIZEM CD) 360 MG 24 hr capsule TAKE 1 CAPSULE(360 MG) BY MOUTH DAILY 90 capsule 0  . Ferrous Sulfate Dried (FEOSOL) 200 (65 FE) MG TABS Take 1 tablet by mouth daily.      . fluconazole (DIFLUCAN) 150 MG tablet TAKE 1 TABLET BY MOUTH ONCE 7 tablet 3  . insulin glargine (LANTUS) 100 UNIT/ML injection Inject 0.35 mLs (35 Units total) into the skin daily. INJECT 35 UNITS UNDER THE SKIN DAILY (Patient taking differently: Inject 30 Units into the skin daily. INJECT 30 UNITS UNDER THE SKIN DAILY) 45 mL 3  . Insulin Pen Needle (NOVOFINE) 30G X 8 MM MISC For use each time injection for insulin 100 each 4  . JARDIANCE 25 MG TABS tablet TAKE 1 TABLET DAILY 90 tablet 3  . lactase (LACTAID) 3000 UNITS tablet Take 3,000 Units by mouth as needed.     . latanoprost (XALATAN) 0.005 % ophthalmic solution 1 drop.    Marland Kitchen lisinopril (ZESTRIL) 20 MG tablet Take 1 tablet (20 mg total) by mouth daily. 90 tablet 3  . meclizine (ANTIVERT) 25 MG tablet Take 1 tablet (25 mg total) by mouth 3 (three) times daily as needed for dizziness. 90 tablet 1  . metFORMIN (GLUCOPHAGE) 1000 MG tablet TAKE 1 TABLET TWICE A DAY WITH MEALS 180 tablet 3  . Misc. Devices MISC Transfer of care to [facility].  Please provide service to her current CPAP machine for OSA at 14 cm. water pressure.  Send directly to [place] at [facility].    . Omega-3 Fatty Acids (FISH OIL) 1200 MG CAPS Take 1 capsule by mouth daily.    . pantoprazole (PROTONIX) 40 MG tablet TAKE 1 TABLET DAILY 90 tablet 3  . rosuvastatin (CRESTOR) 10 MG tablet Take 1 tablet (10 mg total) by mouth once a week. At bedtime.  0  . TRULICITY 1.5 KY/7.0WC SOPN INJECT 1.5 MG UNDER THE SKIN EVERY 7 DAYS 6 mL 3   No facility-administered medications prior to visit.    Allergies  Allergen  Reactions  . Atorvastatin Other (See Comments)    Patient stated muscle weakness Patient stated muscle weakness  . Beclomethasone Other (See Comments)  unknown unknown  . Cefaclor Hives  . Pioglitazone Other (See Comments)    Mouth sores Mouth sores Mouth sores unknown REACTION: ulcers in the mouth  . Pravastatin Other (See Comments)    Chest pain.  Chest pain.   . Sulfa Antibiotics Hives    Other reaction(s): Agitation  . Cephalexin   . Cephalexin Other (See Comments)    unknown  . Lipitor [Atorvastatin Calcium] Other (See Comments)    myalgias  . Statins Other (See Comments)    Muscle pain Other reaction(s): Other (See Comments) Muscle pain    ROS Review of Systems    Objective:    Physical Exam Constitutional:      Appearance: She is well-developed.  HENT:     Head: Normocephalic and atraumatic.  Cardiovascular:     Rate and Rhythm: Normal rate and regular rhythm.     Heart sounds: Normal heart sounds.  Pulmonary:     Effort: Pulmonary effort is normal.     Breath sounds: Normal breath sounds.  Skin:    General: Skin is warm and dry.  Neurological:     Mental Status: She is alert and oriented to person, place, and time.     Comments: Active tremor on exam today.  Psychiatric:        Behavior: Behavior normal.     BP (!) 114/38   Pulse (!) 49   Ht 5\' 1"  (1.549 m)   Wt (!) 348 lb (157.9 kg)   SpO2 99%   BMI 65.75 kg/m  Wt Readings from Last 3 Encounters:  05/05/20 (!) 348 lb (157.9 kg)  01/29/20 (!) 349 lb (158.3 kg)  09/08/19 (!) 352 lb (159.7 kg)     There are no preventive care reminders to display for this patient.  There are no preventive care reminders to display for this patient.  Lab Results  Component Value Date   TSH 2.28 09/22/2019   Lab Results  Component Value Date   WBC 8.0 08/19/2018   HGB 14.4 08/19/2018   HCT 44.2 08/19/2018   MCV 86.2 08/19/2018   PLT 346 08/19/2018   Lab Results  Component Value Date   NA  141 09/22/2019   K 4.2 09/22/2019   CO2 31 09/22/2019   GLUCOSE 119 09/22/2019   BUN 16 09/22/2019   CREATININE 0.83 09/22/2019   BILITOT 0.4 09/22/2019   ALKPHOS 71 06/08/2016   AST 15 09/22/2019   ALT 12 09/22/2019   PROT 6.8 09/22/2019   ALBUMIN 4.3 06/08/2016   CALCIUM 9.7 09/22/2019   ANIONGAP 7 05/09/2015   Lab Results  Component Value Date   CHOL 163 09/22/2019   Lab Results  Component Value Date   HDL 52 09/22/2019   Lab Results  Component Value Date   LDLCALC 90 09/22/2019   Lab Results  Component Value Date   TRIG 109 09/22/2019   Lab Results  Component Value Date   CHOLHDL 3.1 09/22/2019   Lab Results  Component Value Date   HGBA1C 7.0 (A) 05/05/2020      Assessment & Plan:   Problem List Items Addressed This Visit      Cardiovascular and Mediastinum   Essential hypertension, benign   Relevant Orders   BASIC METABOLIC PANEL WITH GFR     Endocrine   Diabetes type 2, uncontrolled (HCC) - Primary    A1c back up to 7.0.  Just encouraged her to continue to work on healthy diet and regular exercise and continue to  use her insulin and Trulicity regularly.  Follow-up in 3 months.      Relevant Orders   POCT glycosylated hemoglobin (Hb A1C) (Completed)   BASIC METABOLIC PANEL WITH GFR     Musculoskeletal and Integument   Pre-ulcerative corn or callous    Discussed referral to podiatry for further evaluation.  Unfortunately the inserts that she purchased are not helpful. She will need custom shoe and inserts because of her diabetes.       Relevant Orders   Ambulatory referral to Podiatry     Other   Tremor    Unclear etiology but we did discuss some differences between essential tremor and Parkinson's disease at this point I do not see any indication for Parkinson's anoxia resting tremor on exam I did encourage her to try to recorded if she can 2nd see it.  She really has not had any changes in medication regimen to suspect a medication side  effect with it still possible.  Keep an eye on it       Other Visit Diagnoses    Need for immunization against influenza       Relevant Orders   Flu Vaccine QUAD High Dose(Fluad) (Completed)      No orders of the defined types were placed in this encounter.   Follow-up: Return in about 3 months (around 08/05/2020) for Diabetes follow-up, Hypertension.    Beatrice Lecher, MD

## 2020-05-13 DIAGNOSIS — G4733 Obstructive sleep apnea (adult) (pediatric): Secondary | ICD-10-CM | POA: Diagnosis not present

## 2020-05-13 DIAGNOSIS — Z9989 Dependence on other enabling machines and devices: Secondary | ICD-10-CM | POA: Diagnosis not present

## 2020-05-30 ENCOUNTER — Other Ambulatory Visit: Payer: Self-pay

## 2020-05-30 ENCOUNTER — Ambulatory Visit (INDEPENDENT_AMBULATORY_CARE_PROVIDER_SITE_OTHER): Payer: Medicare Other | Admitting: Podiatry

## 2020-05-30 VITALS — BP 119/71 | HR 54 | Temp 98.3°F

## 2020-05-30 DIAGNOSIS — E119 Type 2 diabetes mellitus without complications: Secondary | ICD-10-CM

## 2020-05-30 DIAGNOSIS — Z7901 Long term (current) use of anticoagulants: Secondary | ICD-10-CM

## 2020-05-30 DIAGNOSIS — L84 Corns and callosities: Secondary | ICD-10-CM | POA: Diagnosis not present

## 2020-05-30 DIAGNOSIS — M2042 Other hammer toe(s) (acquired), left foot: Secondary | ICD-10-CM | POA: Diagnosis not present

## 2020-05-30 DIAGNOSIS — Z872 Personal history of diseases of the skin and subcutaneous tissue: Secondary | ICD-10-CM

## 2020-05-30 DIAGNOSIS — B351 Tinea unguium: Secondary | ICD-10-CM

## 2020-05-30 DIAGNOSIS — M2041 Other hammer toe(s) (acquired), right foot: Secondary | ICD-10-CM | POA: Diagnosis not present

## 2020-05-30 MED ORDER — CICLOPIROX 8 % EX SOLN: Freq: Every day | CUTANEOUS | 4 refills | Status: DC

## 2020-05-30 NOTE — Patient Instructions (Signed)
Diabetes Mellitus and Foot Care Foot care is an important part of your health, especially when you have diabetes. Diabetes may cause you to have problems because of poor blood flow (circulation) to your feet and legs, which can cause your skin to:  Become thinner and drier.  Break more easily.  Heal more slowly.  Peel and crack. You may also have nerve damage (neuropathy) in your legs and feet, causing decreased feeling in them. This means that you may not notice minor injuries to your feet that could lead to more serious problems. Noticing and addressing any potential problems early is the best way to prevent future foot problems. How to care for your feet Foot hygiene  Wash your feet daily with warm water and mild soap. Do not use hot water. Then, pat your feet and the areas between your toes until they are completely dry. Do not soak your feet as this can dry your skin.  Trim your toenails straight across. Do not dig under them or around the cuticle. File the edges of your nails with an emery board or nail file.  Apply a moisturizing lotion or petroleum jelly to the skin on your feet and to dry, brittle toenails. Use lotion that does not contain alcohol and is unscented. Do not apply lotion between your toes. Shoes and socks  Wear clean socks or stockings every day. Make sure they are not too tight. Do not wear knee-high stockings since they may decrease blood flow to your legs.  Wear shoes that fit properly and have enough cushioning. Always look in your shoes before you put them on to be sure there are no objects inside.  To break in new shoes, wear them for just a few hours a day. This prevents injuries on your feet. Wounds, scrapes, corns, and calluses  Check your feet daily for blisters, cuts, bruises, sores, and redness. If you cannot see the bottom of your feet, use a mirror or ask someone for help.  Do not cut corns or calluses or try to remove them with medicine.  If you  find a minor scrape, cut, or break in the skin on your feet, keep it and the skin around it clean and dry. You may clean these areas with mild soap and water. Do not clean the area with peroxide, alcohol, or iodine.  If you have a wound, scrape, corn, or callus on your foot, look at it several times a day to make sure it is healing and not infected. Check for: ? Redness, swelling, or pain. ? Fluid or blood. ? Warmth. ? Pus or a bad smell. General instructions  Do not cross your legs. This may decrease blood flow to your feet.  Do not use heating pads or hot water bottles on your feet. They may burn your skin. If you have lost feeling in your feet or legs, you may not know this is happening until it is too late.  Protect your feet from hot and cold by wearing shoes, such as at the beach or on hot pavement.  Schedule a complete foot exam at least once a year (annually) or more often if you have foot problems. If you have foot problems, report any cuts, sores, or bruises to your health care provider immediately. Contact a health care provider if:  You have a medical condition that increases your risk of infection and you have any cuts, sores, or bruises on your feet.  You have an injury that is not   healing.  You have redness on your legs or feet.  You feel burning or tingling in your legs or feet.  You have pain or cramps in your legs and feet.  Your legs or feet are numb.  Your feet always feel cold.  You have pain around a toenail. Get help right away if:  You have a wound, scrape, corn, or callus on your foot and: ? You have pain, swelling, or redness that gets worse. ? You have fluid or blood coming from the wound, scrape, corn, or callus. ? Your wound, scrape, corn, or callus feels warm to the touch. ? You have pus or a bad smell coming from the wound, scrape, corn, or callus. ? You have a fever. ? You have a red line going up your leg. Summary  Check your feet every day  for cuts, sores, red spots, swelling, and blisters.  Moisturize feet and legs daily.  Wear shoes that fit properly and have enough cushioning.  If you have foot problems, report any cuts, sores, or bruises to your health care provider immediately.  Schedule a complete foot exam at least once a year (annually) or more often if you have foot problems. This information is not intended to replace advice given to you by your health care provider. Make sure you discuss any questions you have with your health care provider. Document Revised: 03/11/2019 Document Reviewed: 07/20/2016 Elsevier Patient Education  2020 Elsevier Inc.  

## 2020-06-01 NOTE — Progress Notes (Signed)
Subjective:   Patient ID: Natalie Wright, female   DOB: 69 y.o.   MRN: [id number]   HPI 69 year old female presents the office today for concerns of preulcerative calluses on her right foot.  She said that she previously had an ulcer to this area which she got after wearing new pair shoes that rubbed.  She currently denies any open sores.  She does have neuropathy and she feels that she is unsteady at times but no recent falls.  Also concern for toenail fungus.  She is currently on Plavix.  Her last A1c was 7.0.  She has no other concerns today.   Review of Systems  All other systems reviewed and are negative.  Past Medical History:  Diagnosis Date  . Allergy    to cats and dogs  . Diabetes mellitus   . Hiatal hernia    and delayed gastric emptying/ sees [facility] for recurrent diarrhea  . History of cardiovascular disorder 12/10/2008   Qualifier: Diagnosis of  By: Madilyn Fireman MD, Barnetta Chapel    . Hypertension   . OSA (obstructive sleep apnea)    CPAP 52mmhg  . Pedal edema   . Spastic bladder    urology in [place]  . TIA (transient ischemic attack)    hx of- sees Dr Elwin Mocha (Neurology)    Past Surgical History:  Procedure Laterality Date  . vaginal skin tag removal       Current Outpatient Medications:  .  acetaminophen (TYLENOL) 650 MG CR tablet, Take 650 mg by mouth every 8 (eight) hours as needed.  , Disp: , Rfl:  .  AMBULATORY NON FORMULARY MEDICATION, Medication Name: custom fit knee high compression stocking with 15-20 mmHg pressure. Open toe preferred., Disp: 1 vial, Rfl: 0 .  Armodafinil 250 MG tablet, Take by mouth., Disp: , Rfl:  .  aspirin 325 MG tablet, Take 325 mg by mouth daily., Disp: , Rfl:  .  cetirizine (ZYRTEC) 10 MG tablet, Take 10 mg by mouth daily.  , Disp: , Rfl:  .  chlorthalidone (HYGROTON) 25 MG tablet, TAKE 1 TABLET DAILY, Disp: 90 tablet, Rfl: 3 .  ciclopirox (PENLAC) 8 % solution, Apply topically at bedtime. Apply over nail and surrounding skin. Apply daily  over previous coat. After seven (7) days, may remove with alcohol and continue cycle., Disp: 6.6 mL, Rfl: 4 .  clobetasol cream (TEMOVATE) 8.41 %, Apply 1 application topically 2 (two) times daily., Disp: 45 g, Rfl: 1 .  clopidogrel (PLAVIX) 75 MG tablet, TAKE 1 TABLET DAILY, Disp: 90 tablet, Rfl: 3 .  diltiazem (CARDIZEM CD) 360 MG 24 hr capsule, TAKE 1 CAPSULE(360 MG) BY MOUTH DAILY, Disp: 90 capsule, Rfl: 0 .  Ferrous Sulfate Dried (FEOSOL) 200 (65 FE) MG TABS, Take 1 tablet by mouth daily.  , Disp: , Rfl:  .  fluconazole (DIFLUCAN) 150 MG tablet, TAKE 1 TABLET BY MOUTH ONCE, Disp: 7 tablet, Rfl: 3 .  insulin glargine (LANTUS) 100 UNIT/ML injection, Inject 0.35 mLs (35 Units total) into the skin daily. INJECT 35 UNITS UNDER THE SKIN DAILY (Patient taking differently: Inject 30 Units into the skin daily. INJECT 30 UNITS UNDER THE SKIN DAILY), Disp: 45 mL, Rfl: 3 .  Insulin Pen Needle (NOVOFINE) 30G X 8 MM MISC, For use each time injection for insulin, Disp: 100 each, Rfl: 4 .  JARDIANCE 25 MG TABS tablet, TAKE 1 TABLET DAILY, Disp: 90 tablet, Rfl: 3 .  lactase (LACTAID) 3000 UNITS tablet, Take 3,000 Units by mouth  as needed. , Disp: , Rfl:  .  latanoprost (XALATAN) 0.005 % ophthalmic solution, 1 drop., Disp: , Rfl:  .  lisinopril (ZESTRIL) 20 MG tablet, Take 1 tablet (20 mg total) by mouth daily., Disp: 90 tablet, Rfl: 3 .  meclizine (ANTIVERT) 25 MG tablet, Take 1 tablet (25 mg total) by mouth 3 (three) times daily as needed for dizziness., Disp: 90 tablet, Rfl: 1 .  metFORMIN (GLUCOPHAGE) 1000 MG tablet, TAKE 1 TABLET TWICE A DAY WITH MEALS, Disp: 180 tablet, Rfl: 3 .  Misc. Devices MISC, Transfer of care to [facility].  Please provide service to her current CPAP machine for OSA at 14 cm. water pressure.  Send directly to Jeneen Rinks at [facility]., Disp: , Rfl:  .  Omega-3 Fatty Acids (FISH OIL) 1200 MG CAPS, Take 1 capsule by mouth daily., Disp: , Rfl:  .  pantoprazole (PROTONIX) 40 MG tablet, TAKE 1  TABLET DAILY, Disp: 90 tablet, Rfl: 3 .  rosuvastatin (CRESTOR) 10 MG tablet, Take 1 tablet (10 mg total) by mouth once a week. At bedtime., Disp: , Rfl: 0 .  TRULICITY 1.5 BD/5.3GD SOPN, INJECT 1.5 MG UNDER THE SKIN EVERY 7 DAYS, Disp: 6 mL, Rfl: 3  Allergies  Allergen Reactions  . Atorvastatin Other (See Comments)    Patient stated muscle weakness Patient stated muscle weakness  . Beclomethasone Other (See Comments)    unknown unknown  . Cefaclor Hives  . Pioglitazone Other (See Comments)    Mouth sores Mouth sores Mouth sores unknown REACTION: ulcers in the mouth  . Pravastatin Other (See Comments)    Chest pain.  Chest pain.   . Sulfa Antibiotics Hives    Other reaction(s): Agitation  . Cephalexin   . Cephalexin Other (See Comments)    unknown  . Lipitor [Atorvastatin Calcium] Other (See Comments)    myalgias  . Statins Other (See Comments)    Muscle pain Other reaction(s): Other (See Comments) Muscle pain         Objective:  Physical Exam  General: AAO x3, NAD  Dermatological: Mild hyperkeratotic tissue present submetatarsal 3 area in the right foot.  There is no underlying ulceration drainage or signs of infection.  The nails appear to be hypertrophic, dystrophic with yellow-brown discoloration.  There is no swelling redness or any drainage to the toenail sites.  Vascular: Dorsalis Pedis artery and Posterior Tibial artery pedal pulses are 2/4 bilateral with immedate capillary fill time. There is no pain with calf compression, swelling, warmth, erythema.   Neruologic: Sensation decreased with Semmes Weinstein monofilament.  Musculoskeletal: Hammertoes.  Muscular strength 5/5 in all groups tested bilateral.  Gait: Unassisted, Nonantalgic.       Assessment:   69 year old female with history of ulceration of preulcerative calluses, onychomycosis     Plan:  -Treatment options discussed including all alternatives, risks, and complications -Etiology of  symptoms were discussed -Debrided hyperkeratotic lesions x2 in the right foot without any complications or bleeding.  Recommend moisturizer daily. -Debrided nails x10 without any complications or bleeding.  Prescribed Penlac. -Discussed that the foot inspection.  Also discussed glucose control. -Follow up for diabetic shoe measurment  Trula Slade DPM

## 2020-06-06 ENCOUNTER — Ambulatory Visit: Payer: Medicare Other | Admitting: Orthotics

## 2020-06-06 ENCOUNTER — Other Ambulatory Visit: Payer: Self-pay

## 2020-06-06 DIAGNOSIS — E119 Type 2 diabetes mellitus without complications: Secondary | ICD-10-CM

## 2020-06-06 DIAGNOSIS — M2042 Other hammer toe(s) (acquired), left foot: Secondary | ICD-10-CM

## 2020-06-06 DIAGNOSIS — M2041 Other hammer toe(s) (acquired), right foot: Secondary | ICD-10-CM

## 2020-06-06 NOTE — Progress Notes (Signed)
Patient just wants diabetic insoles, no shoes.Marland KitchenMarland KitchenCast her today for [phone]

## 2020-06-20 ENCOUNTER — Telehealth: Payer: Self-pay | Admitting: *Deleted

## 2020-06-20 NOTE — Telephone Encounter (Signed)
Form completed,faxed,confirmation received and scanned into patient's chart.

## 2020-06-23 ENCOUNTER — Other Ambulatory Visit: Payer: Self-pay | Admitting: Family Medicine

## 2020-08-08 ENCOUNTER — Other Ambulatory Visit: Payer: Self-pay | Admitting: Family Medicine

## 2020-08-08 ENCOUNTER — Ambulatory Visit: Payer: Medicare Other | Admitting: Family Medicine

## 2020-08-16 ENCOUNTER — Ambulatory Visit: Payer: Medicare Other | Admitting: Orthotics

## 2020-08-22 ENCOUNTER — Ambulatory Visit: Payer: Medicare Other | Admitting: Orthotics

## 2020-08-22 ENCOUNTER — Other Ambulatory Visit: Payer: Self-pay

## 2020-08-29 ENCOUNTER — Encounter: Payer: Self-pay | Admitting: Family Medicine

## 2020-08-29 ENCOUNTER — Other Ambulatory Visit: Payer: Self-pay

## 2020-08-29 ENCOUNTER — Ambulatory Visit (INDEPENDENT_AMBULATORY_CARE_PROVIDER_SITE_OTHER): Payer: Medicare Other | Admitting: Family Medicine

## 2020-08-29 VITALS — BP 126/62 | HR 63 | Ht 61.0 in | Wt 348.0 lb

## 2020-08-29 DIAGNOSIS — I10 Essential (primary) hypertension: Secondary | ICD-10-CM

## 2020-08-29 DIAGNOSIS — I700 Atherosclerosis of aorta: Secondary | ICD-10-CM | POA: Diagnosis not present

## 2020-08-29 DIAGNOSIS — E1165 Type 2 diabetes mellitus with hyperglycemia: Secondary | ICD-10-CM

## 2020-08-29 DIAGNOSIS — Z8739 Personal history of other diseases of the musculoskeletal system and connective tissue: Secondary | ICD-10-CM | POA: Diagnosis not present

## 2020-08-29 DIAGNOSIS — E1149 Type 2 diabetes mellitus with other diabetic neurological complication: Secondary | ICD-10-CM | POA: Diagnosis not present

## 2020-08-29 DIAGNOSIS — R109 Unspecified abdominal pain: Secondary | ICD-10-CM

## 2020-08-29 LAB — POCT URINALYSIS DIP (CLINITEK)
Bilirubin, UA: NEGATIVE
Blood, UA: NEGATIVE
Glucose, UA: >=1000 mg/dL — AB
Leukocytes, UA: NEGATIVE
Nitrite, UA: NEGATIVE
POC PROTEIN,UA: NEGATIVE
Spec Grav, UA: 1.015 (ref 1.010–1.025)
Urobilinogen, UA: 0.2 E.U./dL
pH, UA: 5 (ref 5.0–8.0)

## 2020-08-29 LAB — POCT GLYCOSYLATED HEMOGLOBIN (HGB A1C): Hemoglobin A1C: 6.9 % — AB (ref 4.0–5.6)

## 2020-08-29 NOTE — Assessment & Plan Note (Signed)
A1c is stable at 6.9.  Encouraged her to cut back the soda again.  Continue work on [employer] and regular exercise.  Continue with Trulicity which she does feel has been helpful in suppressing her appetite.  We did discuss even possibly switching to Ozempic in the future.  Get updated lab work.. Continue current regimen. Follow up in  3 months.

## 2020-08-29 NOTE — Progress Notes (Signed)
Established Patient Office Visit  Subjective:  Patient ID: Natalie Wright, female    DOB: 1950-10-07  Age: 70 y.o. MRN: [id number]  CC:  Chief Complaint  Patient presents with  . Hypertension  . Diabetes    HPI Natalie Wright presents for   Hypertension- Pt denies chest pain, SOB, dizziness, or heart palpitations.  Taking meds as directed w/o problems.  Denies medication side effects.    Diabetes -had a couple of hypoglycemic events including a drop down to 50.  But she is also had some elevated glucose numbers in the 300s.. No wounds or sores that are not healing well. No increased thirst or urination. Checking glucose at home. Taking medications as prescribed without any side effects.  Under recent increased stress recently as her husband's daughter passed away.  So reports for the last 2 nights she has woken up in the middle the night with left-sided side/flank pain.  She says when it happened she just cannot seem to get comfortable she usually eventually falls back asleep.  Her bowel movements have been normal she denies any nausea or vomiting with the symptoms.  She also has recently seen podiatry and they put her on a B complex for the burning in her ankles and she says it is actually made a really big difference.  They were going to consider gabapentin if it did not improve.  Past Medical History:  Diagnosis Date  . Allergy    to cats and dogs  . Diabetes mellitus   . Hiatal hernia    and delayed gastric emptying/ sees [facility] for recurrent diarrhea  . History of cardiovascular disorder 12/10/2008   Qualifier: Diagnosis of  By: Madilyn Fireman MD, Barnetta Chapel    . Hypertension   . OSA (obstructive sleep apnea)    CPAP 69mmhg  . Pedal edema   . Spastic bladder    urology in [place]  . TIA (transient ischemic attack)    hx of- sees Dr Elwin Mocha (Neurology)    Past Surgical History:  Procedure Laterality Date  . vaginal skin tag removal      Family History  Problem Relation  Age of Onset  . Diabetes Mother   . Hypertension Mother   . Glaucoma Mother   . Cancer Mother        bone cancer  . Alcohol abuse Father   . Emphysema Father   . Other Father        CHF  . Ovarian cancer Sister     Social History   Socioeconomic History  . Marital status: Married    Spouse name: Natalie Wright  . Number of children: 2  . Years of education: Masters  . Highest education level: Master's degree (e.g., MA, MS, MEng, MEd, MSW, MBA)  Occupational History  . 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 Smoking status: Former Research scientist (life sciences)  . Smokeless tobacco: Never Used  Vaping Use  . Vaping Use: Never used  Substance and Sexual Activity  . Alcohol use: Yes    Comment: social  . Drug use: No  . Sexual activity: Never  Other Topics Concern  . Not on file  Social History Narrative   Married to [place].  Retired and works 1 day awake for her daughter just to be out and about.   Social Determinants of Health   Financial Resource Strain: Not on file  Food Insecurity: Not on file  Transportation Needs: Not  on file  Physical Activity: Not on file  Stress: Not on file  Social Connections: Not on file  Intimate Partner Violence: Not on file    Outpatient Medications Prior to Visit  Medication Sig Dispense Refill  . acetaminophen (TYLENOL) 650 MG CR tablet Take 650 mg by mouth every 8 (eight) hours as needed.    . AMBULATORY NON FORMULARY MEDICATION Medication Name: custom fit knee high compression stocking with 15-20 mmHg pressure. Open toe preferred. 1 vial 0  . Armodafinil 250 MG tablet Take by mouth.    Marland Kitchen aspirin 325 MG tablet Take 325 mg by mouth daily.    . cetirizine (ZYRTEC) 10 MG tablet Take 10 mg by mouth daily.    . chlorthalidone (HYGROTON) 25 MG tablet TAKE 1 TABLET DAILY 90 tablet 3  . ciclopirox (PENLAC) 8 % solution Apply topically at bedtime. Apply over nail and surrounding skin. Apply daily over previous  coat. After seven (7) days, may remove with alcohol and continue cycle. 6.6 mL 4  . clobetasol cream (TEMOVATE) 2.35 % Apply 1 application topically 2 (two) times daily. 45 g 1  . clopidogrel (PLAVIX) 75 MG tablet TAKE 1 TABLET DAILY 90 tablet 3  . diltiazem (CARDIZEM CD) 360 MG 24 hr capsule TAKE 1 CAPSULE(360 MG) BY MOUTH DAILY 90 capsule 0  . Ferrous Sulfate Dried 200 (65 Fe) MG TABS Take 1 tablet by mouth daily.    . fluconazole (DIFLUCAN) 150 MG tablet TAKE 1 TABLET BY MOUTH ONCE 7 tablet 3  . insulin glargine (LANTUS) 100 UNIT/ML injection Inject 0.35 mLs (35 Units total) into the skin daily. INJECT 35 UNITS UNDER THE SKIN DAILY (Patient taking differently: Inject 30 Units into the skin daily. INJECT 30 UNITS UNDER THE SKIN DAILY) 45 mL 3  . Insulin Pen Needle (NOVOFINE) 30G X 8 MM MISC For use each time injection for insulin 100 each 4  . JARDIANCE 25 MG TABS tablet TAKE 1 TABLET DAILY 90 tablet 3  . lactase (LACTAID) 3000 UNITS tablet Take 3,000 Units by mouth as needed.     . latanoprost (XALATAN) 0.005 % ophthalmic solution 1 drop.    Marland Kitchen lisinopril (ZESTRIL) 20 MG tablet Take 1 tablet (20 mg total) by mouth daily. 90 tablet 3  . meclizine (ANTIVERT) 25 MG tablet Take 1 tablet (25 mg total) by mouth 3 (three) times daily as needed for dizziness. 90 tablet 1  . metFORMIN (GLUCOPHAGE) 1000 MG tablet TAKE 1 TABLET TWICE A DAY WITH MEALS 180 tablet 3  . Misc. Devices MISC Transfer of care to [facility].  Please provide service to her current CPAP machine for OSA at 14 cm. water pressure.  Send directly to [facility] at [facility].    . Omega-3 Fatty Acids (FISH OIL) 1200 MG CAPS Take 1 capsule by mouth daily.    . pantoprazole (PROTONIX) 40 MG tablet TAKE 1 TABLET DAILY 90 tablet 3  . rosuvastatin (CRESTOR) 10 MG tablet Take 1 tablet (10 mg total) by mouth once a week. At bedtime.  0  . TRULICITY 1.5 TI/1.4ER SOPN INJECT 1.5 MG UNDER THE SKIN EVERY 7 DAYS 6 mL 3   No facility-administered medications  prior to visit.    Allergies  Allergen Reactions  . Atorvastatin Other (See Comments)    Patient stated muscle weakness Patient stated muscle weakness  . Beclomethasone Other (See Comments)    unknown unknown  . Cefaclor Hives  . Pioglitazone Other (See Comments)    Mouth sores Mouth  sores Mouth sores unknown REACTION: ulcers in the mouth  . Pravastatin Other (See Comments)    Chest pain.  Chest pain.   . Sulfa Antibiotics Hives    Other reaction(s): Agitation  . Cephalexin   . Cephalexin Other (See Comments)    unknown  . Lipitor [Atorvastatin Calcium] Other (See Comments)    myalgias  . Statins Other (See Comments)    Muscle pain Other reaction(s): Other (See Comments) Muscle pain    ROS Review of Systems    Objective:    Physical Exam Constitutional:      Appearance: She is well-developed and well-nourished.  HENT:     Head: Normocephalic and atraumatic.  Cardiovascular:     Rate and Rhythm: Normal rate and regular rhythm.     Heart sounds: Normal heart sounds.  Pulmonary:     Effort: Pulmonary effort is normal.     Breath sounds: Normal breath sounds.  Skin:    General: Skin is warm and dry.  Neurological:     Mental Status: She is alert and oriented to person, place, and time.  Psychiatric:        Mood and Affect: Mood and affect normal.        Behavior: Behavior normal.     BP 126/62   Pulse 63   Ht 5\' 1"  (1.549 m)   Wt (!) 348 lb (157.9 kg)   SpO2 100%   BMI 65.75 kg/m  Wt Readings from Last 3 Encounters:  08/29/20 (!) 348 lb (157.9 kg)  05/05/20 (!) 348 lb (157.9 kg)  01/29/20 (!) 349 lb (158.3 kg)     There are no preventive care reminders to display for this patient.  There are no preventive care reminders to display for this patient.  Lab Results  Component Value Date   TSH 2.28 09/22/2019   Lab Results  Component Value Date   WBC 8.0 08/19/2018   HGB 14.4 08/19/2018   HCT 44.2 08/19/2018   MCV 86.2 08/19/2018   PLT  346 08/19/2018   Lab Results  Component Value Date   NA 141 09/22/2019   K 4.2 09/22/2019   CO2 31 09/22/2019   GLUCOSE 119 09/22/2019   BUN 16 09/22/2019   CREATININE 0.83 09/22/2019   BILITOT 0.4 09/22/2019   ALKPHOS 71 06/08/2016   AST 15 09/22/2019   ALT 12 09/22/2019   PROT 6.8 09/22/2019   ALBUMIN 4.3 06/08/2016   CALCIUM 9.7 09/22/2019   ANIONGAP 7 05/09/2015   Lab Results  Component Value Date   CHOL 163 09/22/2019   Lab Results  Component Value Date   HDL 52 09/22/2019   Lab Results  Component Value Date   LDLCALC 90 09/22/2019   Lab Results  Component Value Date   TRIG 109 09/22/2019   Lab Results  Component Value Date   CHOLHDL 3.1 09/22/2019   Lab Results  Component Value Date   HGBA1C 6.9 (A) 08/29/2020      Assessment & Plan:   Problem List Items Addressed This Visit      Cardiovascular and Mediastinum   Essential hypertension, benign    Well controlled. Continue current regimen. Follow up in  6 mo       Relevant Orders   CBC with Differential/Platelet   Lipase   COMPLETE METABOLIC PANEL WITH GFR   Aortic atherosclerosis (HCC)    Continue Crestor daily.        Endocrine   Type 2 diabetes mellitus with neurological  complications ([place])   Diabetes type 2, uncontrolled ([place]) - Primary    A1c is stable at 6.9.  Encouraged her to cut back the soda again.  Continue work on [employer] and regular exercise.  Continue with Trulicity which she does feel has been helpful in suppressing her appetite.  We did discuss even possibly switching to Ozempic in the future.  Get updated lab work.. Continue current regimen. Follow up in  3 months.        Relevant Orders   CBC with Differential/Platelet   Lipase   COMPLETE METABOLIC PANEL WITH GFR   POCT glycosylated hemoglobin (Hb A1C) (Completed)     Other   History of burning pain in leg    Other Visit Diagnoses    Left flank pain       Relevant Orders   CBC with Differential/Platelet    Lipase   COMPLETE METABOLIC PANEL WITH GFR   POCT URINALYSIS DIP (CLINITEK) (Completed)      Left side pain-unclear etiology but will check a lipase since she is on the Trulicity.  Also just check renal function as well as a urinalysis.  Will call with results once available.  Encouraged her to push fluids and hydrate and see if it actually improves.  Ankle burning sensation/neuropathy-seems to be responding really well to a B complex.  No orders of the defined types were placed in this encounter.   Follow-up: Return in about 3 months (around 11/26/2020) for Diabetes follow-up.    Beatrice Lecher, MD

## 2020-08-29 NOTE — Patient Instructions (Signed)
A1C 6.9 today!!! Keep work at it. I would love to get it to 6.5.

## 2020-08-29 NOTE — Assessment & Plan Note (Signed)
Continue Crestor daily.

## 2020-08-29 NOTE — Assessment & Plan Note (Signed)
Well controlled. Continue current regimen. Follow up in  6 mo  

## 2020-09-09 ENCOUNTER — Other Ambulatory Visit: Payer: Self-pay

## 2020-09-09 ENCOUNTER — Ambulatory Visit (INDEPENDENT_AMBULATORY_CARE_PROVIDER_SITE_OTHER): Payer: Medicare Other | Admitting: Podiatry

## 2020-09-09 DIAGNOSIS — M79675 Pain in left toe(s): Secondary | ICD-10-CM | POA: Diagnosis not present

## 2020-09-09 DIAGNOSIS — L84 Corns and callosities: Secondary | ICD-10-CM | POA: Diagnosis not present

## 2020-09-09 DIAGNOSIS — M79674 Pain in right toe(s): Secondary | ICD-10-CM

## 2020-09-09 DIAGNOSIS — B351 Tinea unguium: Secondary | ICD-10-CM | POA: Diagnosis not present

## 2020-09-09 DIAGNOSIS — Z7901 Long term (current) use of anticoagulants: Secondary | ICD-10-CM | POA: Diagnosis not present

## 2020-09-11 NOTE — Progress Notes (Signed)
Subjective: 70 y.o. returns the office today for painful, elongated, thickened toenails which they cannot trim herself. Denies any redness or drainage around the nails.  Also has calluses to her feet.  She describes new areas of callus formation mostly on the right foot today.  Denies any acute changes since last appointment and no new complaints today. Denies any systemic complaints such as fevers, chills, nausea, vomiting.   PCP: Hali Marry, MD A1c: 63.9 on 08/29/2020  Objective: AAO 3, NAD DP/PT pulses palpable, CRT less than 3 seconds Protective sensation decreased with Simms Weinstein monofilament Nails hypertrophic, dystrophic, elongated, brittle, discolored 10. There is tenderness overlying the nails 1-5 bilaterally. There is no surrounding erythema or drainage along the nail sites. Hyperkeratotic lesion right foot submetatarsal 5 comfort metatarsal base as well as the heel.  No underlying ulceration drainage or any signs of infection noted today. No open lesions or other pre-ulcerative lesions are identified. No pain with calf compression, swelling, warmth, erythema.  Assessment: Patient presents with symptomatic onychomycosis, hyperkeratotic lesions  Plan: -Treatment options including alternatives, risks, complications were discussed -Nails sharply debrided 10 without complication/bleeding. -Hyperkeratotic lesion sharply debrided x3 without any complications or bleeding.  Recommend moisturizer daily.  I marked her inserts today and I will modify these she will come back fine to expect them out. -Discussed daily foot inspection. If there are any changes, to call the office immediately.  -Follow-up in 3 months or sooner if any problems are to arise. In the meantime, encouraged to call the office with any questions, concerns, changes symptoms.  Celesta Gentile, DPM

## 2020-09-20 ENCOUNTER — Other Ambulatory Visit: Payer: Self-pay | Admitting: Family Medicine

## 2020-09-20 DIAGNOSIS — E119 Type 2 diabetes mellitus without complications: Secondary | ICD-10-CM | POA: Diagnosis not present

## 2020-09-20 DIAGNOSIS — H527 Unspecified disorder of refraction: Secondary | ICD-10-CM | POA: Diagnosis not present

## 2020-09-20 DIAGNOSIS — H40003 Preglaucoma, unspecified, bilateral: Secondary | ICD-10-CM | POA: Diagnosis not present

## 2020-09-20 LAB — HM DIABETES EYE EXAM

## 2020-09-23 ENCOUNTER — Other Ambulatory Visit: Payer: Self-pay | Admitting: Family Medicine

## 2020-09-23 ENCOUNTER — Telehealth: Payer: Self-pay | Admitting: Podiatry

## 2020-09-23 DIAGNOSIS — E1165 Type 2 diabetes mellitus with hyperglycemia: Secondary | ICD-10-CM | POA: Diagnosis not present

## 2020-09-23 DIAGNOSIS — R109 Unspecified abdominal pain: Secondary | ICD-10-CM | POA: Diagnosis not present

## 2020-09-23 DIAGNOSIS — I10 Essential (primary) hypertension: Secondary | ICD-10-CM | POA: Diagnosis not present

## 2020-09-23 NOTE — Telephone Encounter (Signed)
Pt called stating she went to [place] to pick up her orthotics and everyone was gone. She would like to know if you could leave it in [place] and she will stop by to pick it up next week. Please advise.

## 2020-09-24 LAB — COMPLETE METABOLIC PANEL WITH GFR
AG Ratio: 1.5 (calc) (ref 1.0–2.5)
ALT: 18 U/L (ref 6–29)
AST: 19 U/L (ref 10–35)
Albumin: 4.4 g/dL (ref 3.6–5.1)
Alkaline phosphatase (APISO): 93 U/L (ref 37–153)
BUN: 17 mg/dL (ref 7–25)
CO2: 31 mmol/L (ref 20–32)
Calcium: 9.7 mg/dL (ref 8.6–10.4)
Chloride: 103 mmol/L (ref 98–110)
Creat: 0.78 mg/dL (ref 0.50–0.99)
GFR, Est African American: 90 mL/min/{1.73_m2} (ref >=60)
GFR, Est Non African American: 78 mL/min/{1.73_m2} (ref >=60)
Globulin: 2.9 g/dL (calc) (ref 1.9–3.7)
Glucose, Bld: 181 mg/dL — ABNORMAL HIGH (ref 65–99)
Potassium: 4.4 mmol/L (ref 3.5–5.3)
Sodium: 143 mmol/L (ref 135–146)
Total Bilirubin: 0.4 mg/dL (ref 0.2–1.2)
Total Protein: 7.3 g/dL (ref 6.1–8.1)

## 2020-09-24 LAB — CBC WITH DIFFERENTIAL/PLATELET
Absolute Monocytes: 385 cells/uL (ref 200–950)
Basophils Absolute: 38 cells/uL (ref 0–200)
Basophils Relative: 0.4 %
Eosinophils Absolute: 235 cells/uL (ref 15–500)
Eosinophils Relative: 2.5 %
HCT: 43.9 % (ref 35.0–45.0)
Hemoglobin: 14.6 g/dL (ref 11.7–15.5)
Lymphs Abs: 1664 cells/uL (ref 850–3900)
MCH: 28.8 pg (ref 27.0–33.0)
MCHC: 33.3 g/dL (ref 32.0–36.0)
MCV: 86.6 fL (ref 80.0–100.0)
MPV: 9.7 fL (ref 7.5–12.5)
Monocytes Relative: 4.1 %
Neutro Abs: 7078 cells/uL (ref 1500–7800)
Neutrophils Relative %: 75.3 %
Platelets: 332 10*3/uL (ref 140–400)
RBC: 5.07 10*6/uL (ref 3.80–5.10)
RDW: 13.3 % (ref 11.0–15.0)
Total Lymphocyte: 17.7 %
WBC: 9.4 10*3/uL (ref 3.8–10.8)

## 2020-09-24 LAB — BASIC METABOLIC PANEL WITH GFR
BUN: 17 mg/dL (ref 7–25)
CO2: 29 mmol/L (ref 20–32)
Calcium: 9.9 mg/dL (ref 8.6–10.4)
Chloride: 103 mmol/L (ref 98–110)
Creat: 0.79 mg/dL (ref 0.50–0.99)
GFR, Est African American: 89 mL/min/{1.73_m2} (ref >=60)
GFR, Est Non African American: 76 mL/min/{1.73_m2} (ref >=60)
Glucose, Bld: 184 mg/dL — ABNORMAL HIGH (ref 65–99)
Potassium: 4.3 mmol/L (ref 3.5–5.3)
Sodium: 142 mmol/L (ref 135–146)

## 2020-09-24 LAB — LIPASE: Lipase: 23 U/L (ref 7–60)

## 2020-09-26 ENCOUNTER — Telehealth: Payer: Self-pay | Admitting: Podiatry

## 2020-09-26 NOTE — Telephone Encounter (Signed)
Yes, she can come by [facility] to get them

## 2020-09-26 NOTE — Telephone Encounter (Signed)
Notified pt she could pick up her inserts in the [place] office and she said she would try them on here and leave the other 2 pr to be adjusted.

## 2020-09-26 NOTE — Progress Notes (Signed)
All labs are normal. 

## 2020-09-27 ENCOUNTER — Other Ambulatory Visit: Payer: Medicare Other

## 2020-09-30 ENCOUNTER — Other Ambulatory Visit: Payer: Self-pay

## 2020-09-30 ENCOUNTER — Ambulatory Visit (INDEPENDENT_AMBULATORY_CARE_PROVIDER_SITE_OTHER): Payer: Medicare Other | Admitting: Physician Assistant

## 2020-09-30 ENCOUNTER — Ambulatory Visit (INDEPENDENT_AMBULATORY_CARE_PROVIDER_SITE_OTHER): Payer: Medicare Other | Admitting: Podiatry

## 2020-09-30 DIAGNOSIS — Z0000 Encounter for general adult medical examination without abnormal findings: Secondary | ICD-10-CM | POA: Diagnosis not present

## 2020-09-30 DIAGNOSIS — M2042 Other hammer toe(s) (acquired), left foot: Secondary | ICD-10-CM

## 2020-09-30 DIAGNOSIS — Z7901 Long term (current) use of anticoagulants: Secondary | ICD-10-CM

## 2020-09-30 DIAGNOSIS — M2041 Other hammer toe(s) (acquired), right foot: Secondary | ICD-10-CM

## 2020-09-30 NOTE — Progress Notes (Signed)
MEDICARE ANNUAL WELLNESS VISIT  09/30/2020  Telephone Visit Disclaimer This Medicare AWV was conducted by telephone due to national recommendations for restrictions regarding the COVID-19 Pandemic (e.g. social distancing).  I verified, using two identifiers, that I am speaking with Natalie Wright or their authorized healthcare agent. I discussed the limitations, risks, security, and privacy concerns of performing an evaluation and management service by telephone and the potential availability of an in-person appointment in the future. The patient expressed understanding and agreed to proceed.  Location of Patient: Home Location of Provider (nurse):  In the office.  Subjective:    Natalie Wright is a 70 y.o. female patient of Metheney, Rene Kocher, MD who had a Medicare Annual Wellness Visit today via telephone. Natalie Wright is Retired and lives with their spouse. she has 2 children. she reports that she is socially active and does interact with friends/family regularly. she is minimally physically active and enjoys reading, crafts and crossword puzzles.  Patient Care Team: Hali Marry, MD as PCP - General  Advanced Directives 09/30/2020 03/10/2019  Does Patient Have a Medical Advance Directive? No No  Would patient like information on creating a medical advance directive? No - Patient declined No - Patient declined    Hospital Utilization Over the Past 12 Months: # of hospitalizations or ER visits: 0 # of surgeries: 0  Review of Systems    Patient reports that her overall health is unchanged compared to last year.  History obtained from chart review and the patient  Patient Reported Readings (BP, Pulse, CBG, Weight, etc) none  Pain Assessment Pain : No/denies pain     Current Medications & Allergies (verified) Allergies as of 09/30/2020      Reactions   Atorvastatin Other (See Comments)   Patient stated muscle weakness Patient stated muscle weakness    Beclomethasone Other (See Comments)   unknown unknown   Cefaclor Hives   Pioglitazone Other (See Comments)   Mouth sores Mouth sores Mouth sores unknown REACTION: ulcers in the mouth   Pravastatin Other (See Comments)   Chest pain.  Chest pain.    Sulfa Antibiotics Hives   Other reaction(s): Agitation   Cephalexin    Cephalexin Other (See Comments)   unknown   Lipitor [atorvastatin Calcium] Other (See Comments)   myalgias   Statins Other (See Comments)   Muscle pain Other reaction(s): Other (See Comments) Muscle pain      Medication List       Accurate as of September 30, 2020  1:30 PM. If you have any questions, ask your nurse or doctor.        acetaminophen 650 MG CR tablet Commonly known as: TYLENOL Take 650 mg by mouth every 8 (eight) hours as needed.   AMBULATORY NON FORMULARY MEDICATION Medication Name: custom fit knee high compression stocking with 15-20 mmHg pressure. Open toe preferred.   Armodafinil 250 MG tablet Take by mouth. PRN   aspirin 325 MG tablet Take 325 mg by mouth daily.   cetirizine 10 MG tablet Commonly known as: ZYRTEC Take 10 mg by mouth daily.   chlorthalidone 25 MG tablet Commonly known as: HYGROTON TAKE 1 TABLET DAILY   ciclopirox 8 % solution Commonly known as: Penlac Apply topically at bedtime. Apply over nail and surrounding skin. Apply daily over previous coat. After seven (7) days, may remove with alcohol and continue cycle.   clobetasol cream 0.05 % Commonly known as: TEMOVATE Apply 1 application topically 2 (two) times daily.  clopidogrel 75 MG tablet Commonly known as: PLAVIX TAKE 1 TABLET DAILY   diltiazem 360 MG 24 hr capsule Commonly known as: CARDIZEM CD TAKE 1 CAPSULE(360 MG) BY MOUTH DAILY   Ferrous Sulfate Dried 200 (65 Fe) MG Tabs Take 1 tablet by mouth daily.   Fish Oil 1200 MG Caps Take 1 capsule by mouth daily.   fluconazole 150 MG tablet Commonly known as: DIFLUCAN TAKE 1 TABLET BY MOUTH  ONCE What changed: additional instructions   Insulin Pen Needle 30G X 8 MM Misc Commonly known as: NovoFine For use each time injection for insulin   Jardiance 25 MG Tabs tablet Generic drug: empagliflozin TAKE 1 TABLET DAILY   lactase 3000 units tablet Commonly known as: LACTAID Take 3,000 Units by mouth as needed.   Lantus 100 UNIT/ML injection Generic drug: insulin glargine INJECT 35 UNITS UNDER THE SKIN DAILY   latanoprost 0.005 % ophthalmic solution Commonly known as: XALATAN 1 drop.   lisinopril 20 MG tablet Commonly known as: ZESTRIL Take 1 tablet (20 mg total) by mouth daily.   meclizine 25 MG tablet Commonly known as: ANTIVERT Take 1 tablet (25 mg total) by mouth 3 (three) times daily as needed for dizziness.   metFORMIN 1000 MG tablet Commonly known as: GLUCOPHAGE TAKE 1 TABLET TWICE A DAY WITH MEALS   Misc. Devices Misc Transfer of care to [facility].  Please provide service to her current CPAP machine for OSA at 14 cm. water pressure.  Send directly to [facility] at [facility].   pantoprazole 40 MG tablet Commonly known as: PROTONIX TAKE 1 TABLET DAILY   rosuvastatin 10 MG tablet Commonly known as: CRESTOR Take 1 tablet (10 mg total) by mouth once a week. At bedtime.   Trulicity 1.5 KW/4.0XB Sopn Generic drug: Dulaglutide INJECT 1.5 MG UNDER THE SKIN EVERY 7 DAYS       History (reviewed): Past Medical History:  Diagnosis Date  . Allergy    to cats and dogs  . Diabetes mellitus   . Hiatal hernia    and delayed gastric emptying/ sees [facility] for recurrent diarrhea  . History of cardiovascular disorder 12/10/2008   Qualifier: Diagnosis of  By: Madilyn Fireman MD, Barnetta Chapel    . Hypertension   . OSA (obstructive sleep apnea)    CPAP 19mmhg  . Pedal edema   . Spastic bladder    urology in [place]  . TIA (transient ischemic attack)    hx of- sees Dr Elwin Mocha (Neurology)   Past Surgical History:  Procedure Laterality Date  . vaginal skin tag removal      Family History  Problem Relation Age of Onset  . Diabetes Mother   . Hypertension Mother   . Glaucoma Mother   . Cancer Mother        bone cancer  . Alcohol abuse Father   . Emphysema Father   . Other Father        CHF  . Ovarian cancer Sister    Social History   Socioeconomic History  . Marital status: Married    Spouse name: Clifton James  . Number of children: 2  . Years of education: Masters  . Highest education level: Master's degree (e.g., MA, MS, MEng, MEd, MSW, MBA)  Occupational History  . 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 Smoking status: Former Research scientist (life sciences)  . Smokeless tobacco: Never Used  Vaping Use  . Vaping Use: Never used  Substance  and Sexual Activity  . Alcohol use: Yes    Comment: social  . Drug use: No  . Sexual activity: Never  Other Topics Concern  . Not on file  Social History Narrative   Married to [place].  Retired and works 1 day a week for her daughter just to be out and about. Enjoys reading and doing crafts in her free time.   Social Determinants of Health   Financial Resource Strain: Low Risk   . Difficulty of Paying Living Expenses: Not hard at all  Food Insecurity: No Food Insecurity  . Worried About Charity fundraiser in the Last Year: Never true  . Ran Out of Food in the Last Year: Never true  Transportation Needs: No Transportation Needs  . Lack of Transportation (Medical): No  . Lack of Transportation (Non-Medical): No  Physical Activity: Inactive  . Days of Exercise per Week: 0 days  . Minutes of Exercise per Session: 0 min  Stress: No Stress Concern Present  . Feeling of Stress : Not at all  Social Connections: Moderately Integrated  . Frequency of Communication with Friends and Family: More than three times a week  . Frequency of Social Gatherings with Friends and Family: Twice a week  . Attends Religious Services: Never  . Active Member of Clubs or Organizations: Yes  .  Attends Archivist Meetings: More than 4 times per year  . Marital Status: Married    Activities of Daily Living In your present state of health, do you have any difficulty performing the following activities: 09/30/2020  Hearing? N  Vision? N  Difficulty concentrating or making decisions? N  Walking or climbing stairs? N  Dressing or bathing? N  Doing errands, shopping? N  Preparing Food and eating ? N  Using the Toilet? N  In the past six months, have you accidently leaked urine? N  Do you have problems with loss of bowel control? N  Managing your Medications? N  Managing your Finances? N  Housekeeping or managing your Housekeeping? N  Some recent data might be hidden    Patient Education/ Literacy How often do you need to have someone help you when you read instructions, pamphlets, or other written materials from your doctor or pharmacy?: 1 - Never What is the last grade level you completed in school?: Master's degree  Exercise Current Exercise Habits: Home exercise routine, Type of exercise: Other - see comments (chair exercises), Time (Minutes): 15, Frequency (Times/Week): 7, Weekly Exercise (Minutes/Week): 105, Intensity: Mild, Exercise limited by: None identified  Diet Patient reports consuming 2-3 meals a day and 2 snack(s) a day Patient reports that her primary diet is: Regular Patient reports that she does have regular access to food.   Depression Screen PHQ 2/9 Scores 09/30/2020 05/05/2020 01/29/2020 03/10/2019 01/14/2019 05/15/2018 09/27/2017  PHQ - 2 Score 0 0 0 0 0 0 0  PHQ- 9 Score - - 0 - - - 1     Fall Risk Fall Risk  09/30/2020 08/29/2020 01/29/2020 03/10/2019 01/14/2019  Falls in the past year? 0 0 0 1 0  Number falls in past yr: 0 0 0 1 0  Injury with Fall? 0 - 0 0 0  Risk for fall due to : No Fall Risks No Fall Risks - Impaired balance/gait -  Follow up Falls evaluation completed;Education provided - Falls evaluation completed Falls prevention discussed -      Objective:  Natalie Wright seemed alert and oriented and she participated  appropriately during our telephone visit.  Blood Pressure Weight BMI  BP Readings from Last 3 Encounters:  08/29/20 126/62  05/30/20 119/71  05/05/20 (!) 114/38   Wt Readings from Last 3 Encounters:  08/29/20 (!) 348 lb (157.9 kg)  05/05/20 (!) 348 lb (157.9 kg)  01/29/20 (!) 349 lb (158.3 kg)   BMI Readings from Last 1 Encounters:  08/29/20 65.75 kg/m    *Unable to obtain current vital signs, weight, and BMI due to telephone visit type  Hearing/Vision  . Heli did not seem to have difficulty with hearing/understanding during the telephone conversation . Reports that she has had a formal eye exam by an eye care professional within the past year . Reports that she has not had a formal hearing evaluation within the past year *Unable to fully assess hearing and vision during telephone visit type  Cognitive Function: 6CIT Screen 09/30/2020 03/10/2019  What Year? 0 points 0 points  What month? 0 points 0 points  What time? 0 points 0 points  Count back from 20 0 points 0 points  Months in reverse 0 points 0 points  Repeat phrase 0 points 0 points  Total Score 0 0   (Normal:0-7, Significant for Dysfunction: >8)  Normal Cognitive Function Screening: Yes   Immunization & Health Maintenance Record Immunization History  Administered Date(s) Administered  . Fluad Quad(high Dose 65+) 05/15/2019, 05/05/2020  . Influenza Split 06/14/2011, 07/04/2012  . Influenza Whole 04/13/2009  . Influenza, High Dose Seasonal PF 05/10/2016, 04/12/2017, 05/15/2018  . Influenza,inj,Quad PF,6+ Mos 05/03/2014, 05/20/2015  . PFIZER(Purple Top)SARS-COV-2 Vaccination 07/14/2019, 08/04/2019, 04/20/2020  . PPD Test 03/23/2011, 01/09/2013, 05/03/2014, 02/10/2016  . Pneumococcal Conjugate-13 11/19/2014  . Pneumococcal Polysaccharide-23 05/13/2006, 01/04/2017  . Td 04/25/2009  . Tdap 10/31/2019  . Zoster 05/10/2016  .  Zoster Recombinat (Shingrix) 09/22/2019, 11/22/2019    Health Maintenance  Topic Date Due  . INFLUENZA VACCINE  01/30/2021  . HEMOGLOBIN A1C  02/26/2021  . OPHTHALMOLOGY EXAM  03/15/2021  . FOOT EXAM  05/05/2021  . DEXA SCAN  06/26/2021  . MAMMOGRAM  11/04/2021  . Fecal DNA (Cologuard)  12/11/2022  . TETANUS/TDAP  10/30/2029  . COVID-19 Vaccine  Completed  . Hepatitis C Screening  Completed  . PNA vac Low Risk Adult  Completed  . HPV VACCINES  Aged Out       Assessment  This is a routine wellness examination for Natalie Wright.  Health Maintenance: Due or Overdue There are no preventive care reminders to display for this patient.  Natalie Wright does not need a referral for Community Assistance: Care Management:   no Social Work:    no Prescription Assistance:  no Nutrition/Diabetes Education:  no   Plan:  Personalized Goals Goals Addressed              This Visit's Progress   .  Patient Stated (pt-stated)        09/30/2020 AWV Goal: Exercise for General Health   Patient will verbalize understanding of the benefits of increased physical activity:  Exercising regularly is important. It will improve your overall fitness, flexibility, and endurance.  Regular exercise also will improve your overall health. It can help you control your weight, reduce stress, and improve your bone density.  Over the next year, patient will increase physical activity as tolerated with a goal of at least 150 minutes of moderate physical activity per week.   You can tell that you are exercising at a moderate intensity if your heart starts  beating faster and you start breathing faster but can still hold a conversation.  Moderate-intensity exercise ideas include:  Walking 1 mile (1.6 km) in about 15 minutes  Biking  Hiking  Golfing  Dancing  Water aerobics  Patient will verbalize understanding of everyday activities that increase physical activity by providing examples like  the following: ? Yard work, such as: ? Pushing a Conservation officer, nature ? Raking and bagging leaves ? Washing your car ? Pushing a stroller ? Shoveling snow ? Gardening ? Washing windows or floors  Patient will be able to explain general safety guidelines for exercising:   Before you start a new exercise program, talk with your health care provider.  Do not exercise so much that you hurt yourself, feel dizzy, or get very short of breath.  Wear comfortable clothes and wear shoes with good support.  Drink plenty of water while you exercise to prevent dehydration or heat stroke.  Work out until your breathing and your heartbeat get faster.       Personalized Health Maintenance & Screening Recommendations  Bone densitometry screening  - Due in December, 2022  Lung Cancer Screening Recommended: no (Low Dose CT Chest recommended if Age 52-80 years, 30 pack-year currently smoking OR have quit w/in past 15 years) Hepatitis C Screening recommended: no HIV Screening recommended: no  Advanced Directives: Written information was not prepared per patient's request.  Referrals & Orders No orders of the defined types were placed in this encounter.   Follow-up Plan . Follow-up with Hali Marry, MD as planned . Medicare wellness in one year.    I have personally reviewed and noted the following in the patient's chart:   . Medical and social history . Use of alcohol, tobacco or illicit drugs  . Current medications and supplements . Functional ability and status . Nutritional status . Physical activity . Advanced directives . List of other physicians . Hospitalizations, surgeries, and ER visits in previous 12 months . Vitals . Screenings to include cognitive, depression, and falls . Referrals and appointments  In addition, I have reviewed and discussed with Natalie Wright certain preventive protocols, quality metrics, and best practice recommendations. A written personalized  care plan for preventive services as well as general preventive health recommendations is available and can be mailed to the patient at her request.      Tinnie Gens, RN  09/30/2020

## 2020-09-30 NOTE — Patient Instructions (Addendum)
[place] Maintenance Summary and Written Plan of Care  [name] ,  Thank you for allowing me to perform your Medicare Annual Wellness Visit and for your ongoing commitment to your health.   Health Maintenance & Immunization History Health Maintenance  Topic Date Due  . INFLUENZA VACCINE  01/30/2021  . HEMOGLOBIN A1C  02/26/2021  . OPHTHALMOLOGY EXAM  03/15/2021  . FOOT EXAM  05/05/2021  . DEXA SCAN  06/26/2021  . MAMMOGRAM  11/04/2021  . Fecal DNA (Cologuard)  12/11/2022  . TETANUS/TDAP  10/30/2029  . COVID-19 Vaccine  Completed  . Hepatitis C Screening  Completed  . PNA vac Low Risk Adult  Completed  . HPV VACCINES  Aged Out   Immunization History  Administered Date(s) Administered  . Fluad Quad(high Dose 65+) 05/15/2019, 05/05/2020  . Influenza Split 06/14/2011, 07/04/2012  . Influenza Whole 04/13/2009  . Influenza, High Dose Seasonal PF 05/10/2016, 04/12/2017, 05/15/2018  . Influenza,inj,Quad PF,6+ Mos 05/03/2014, 05/20/2015  . PFIZER(Purple Top)SARS-COV-2 Vaccination 07/14/2019, 08/04/2019, 04/20/2020  . PPD Test 03/23/2011, 01/09/2013, 05/03/2014, 02/10/2016  . Pneumococcal Conjugate-13 11/19/2014  . Pneumococcal Polysaccharide-23 05/13/2006, 01/04/2017  . Td 04/25/2009  . Tdap 10/31/2019  . Zoster 05/10/2016  . Zoster Recombinat (Shingrix) 09/22/2019, 11/22/2019    These are the patient goals that we discussed: Goals Addressed              This Visit's Progress   .  Patient Stated (pt-stated)        09/30/2020 AWV Goal: Exercise for General Health   Patient will verbalize understanding of the benefits of increased physical activity:  Exercising regularly is important. It will improve your overall fitness, flexibility, and endurance.  Regular exercise also will improve your overall health. It can help you control your weight, reduce stress, and improve your bone density.  Over the next year, patient will increase physical  activity as tolerated with a goal of at least 150 minutes of moderate physical activity per week.   You can tell that you are exercising at a moderate intensity if your heart starts beating faster and you start breathing faster but can still hold a conversation.  Moderate-intensity exercise ideas include:  Walking 1 mile (1.6 km) in about 15 minutes  Biking  Hiking  Golfing  Dancing  Water aerobics  Patient will verbalize understanding of everyday activities that increase physical activity by providing examples like the following: ? Yard work, such as: ? Pushing a Conservation officer, nature ? Raking and bagging leaves ? Washing your car ? Pushing a stroller ? Shoveling snow ? Gardening ? Washing windows or floors  Patient will be able to explain general safety guidelines for exercising:   Before you start a new exercise program, talk with your health care provider.  Do not exercise so much that you hurt yourself, feel dizzy, or get very short of breath.  Wear comfortable clothes and wear shoes with good support.  Drink plenty of water while you exercise to prevent dehydration or heat stroke.  Work out until your breathing and your heartbeat get faster.         This is a list of Health Maintenance Items that are overdue or due now: There are no preventive care reminders to display for this patient.   Orders/Referrals Placed Today: No orders of the defined types were placed in this encounter.  (Contact our referral department at [phone] if you have not spoken with someone about your referral appointment within the next  5 days)    Follow-up Plan . Follow-up with Hali Marry, MD as planned . Medicare wellness in one year.       Health Maintenance, Female Adopting a healthy lifestyle and getting preventive care are important in promoting health and wellness. Ask your health care provider about:  The right schedule for you to have regular tests and  exams.  Things you can do on your own to prevent diseases and keep yourself healthy. What should I know about diet, weight, and exercise? Eat a healthy diet  Eat a diet that includes plenty of vegetables, fruits, low-fat dairy products, and lean protein.  Do not eat a lot of foods that are high in solid fats, added sugars, or sodium.   Maintain a healthy weight Body mass index (BMI) is used to identify weight problems. It estimates body fat based on height and weight. Your health care provider can help determine your BMI and help you achieve or maintain a healthy weight. Get regular exercise Get regular exercise. This is one of the most important things you can do for your health. Most adults should:  Exercise for at least 150 minutes each week. The exercise should increase your heart rate and make you sweat (moderate-intensity exercise).  Do strengthening exercises at least twice a week. This is in addition to the moderate-intensity exercise.  Spend less time sitting. Even light physical activity can be beneficial. Watch cholesterol and blood lipids Have your blood tested for lipids and cholesterol at 70 years of age, then have this test every 5 years. Have your cholesterol levels checked more often if:  Your lipid or cholesterol levels are high.  You are older than 70 years of age.  You are at high risk for heart disease. What should I know about cancer screening? Depending on your health history and family history, you may need to have cancer screening at various ages. This may include screening for:  Breast cancer.  Cervical cancer.  Colorectal cancer.  Skin cancer.  Lung cancer. What should I know about heart disease, diabetes, and high blood pressure? Blood pressure and heart disease  High blood pressure causes heart disease and increases the risk of stroke. This is more likely to develop in people who have high blood pressure readings, are of African descent, or are  overweight.  Have your blood pressure checked: ? Every 3-5 years if you are 6-48 years of age. ? Every year if you are 75 years old or older. Diabetes Have regular diabetes screenings. This checks your fasting blood sugar level. Have the screening done:  Once every three years after age 72 if you are at a normal weight and have a low risk for diabetes.  More often and at a younger age if you are overweight or have a high risk for diabetes. What should I know about preventing infection? Hepatitis B If you have a higher risk for hepatitis B, you should be screened for this virus. Talk with your health care provider to find out if you are at risk for hepatitis B infection. Hepatitis C Testing is recommended for:  Everyone born from [birth year] through 1965.  Anyone with known risk factors for hepatitis C. Sexually transmitted infections (STIs)  Get screened for STIs, including gonorrhea and chlamydia, if: ? You are sexually active and are younger than 70 years of age. ? You are older than 70 years of age and your health care provider tells you that you are at risk for this  type of infection. ? Your sexual activity has changed since you were last screened, and you are at increased risk for chlamydia or gonorrhea. Ask your health care provider if you are at risk.  Ask your health care provider about whether you are at high risk for HIV. Your health care provider may recommend a prescription medicine to help prevent HIV infection. If you choose to take medicine to prevent HIV, you should first get tested for HIV. You should then be tested every 3 months for as long as you are taking the medicine. Pregnancy  If you are about to stop having your period (premenopausal) and you may become pregnant, seek counseling before you get pregnant.  Take 400 to 800 micrograms (mcg) of folic acid every day if you become pregnant.  Ask for birth control (contraception) if you want to prevent  pregnancy. Osteoporosis and menopause Osteoporosis is a disease in which the bones lose minerals and strength with aging. This can result in bone fractures. If you are 66 years old or older, or if you are at risk for osteoporosis and fractures, ask your health care provider if you should:  Be screened for bone loss.  Take a calcium or vitamin D supplement to lower your risk of fractures.  Be given hormone replacement therapy (HRT) to treat symptoms of menopause. Follow these instructions at home: Lifestyle  Do not use any products that contain nicotine or tobacco, such as cigarettes, e-cigarettes, and chewing tobacco. If you need help quitting, ask your health care provider.  Do not use street drugs.  Do not share needles.  Ask your health care provider for help if you need support or information about quitting drugs. Alcohol use  Do not drink alcohol if: ? Your health care provider tells you not to drink. ? You are pregnant, may be pregnant, or are planning to become pregnant.  If you drink alcohol: ? Limit how much you use to 0-1 drink a day. ? Limit intake if you are breastfeeding.  Be aware of how much alcohol is in your drink. In the [place] one drink equals one 12 oz bottle of beer (355 mL), one 5 oz glass of wine (148 mL), or one 1 oz glass of hard liquor (44 mL). General instructions  Schedule regular health, dental, and eye exams.  Stay current with your vaccines.  Tell your health care provider if: ? You often feel depressed. ? You have ever been abused or do not feel safe at home. Summary  Adopting a healthy lifestyle and getting preventive care are important in promoting health and wellness.  Follow your health care provider's instructions about healthy diet, exercising, and getting tested or screened for diseases.  Follow your health care provider's instructions on monitoring your cholesterol and blood pressure. This information is not intended to replace  advice given to you by your health care provider. Make sure you discuss any questions you have with your health care provider. Document Revised: 06/11/2018 Document Reviewed: 06/11/2018 Elsevier Patient Education  2021 [employer].

## 2020-10-02 ENCOUNTER — Encounter: Payer: Self-pay | Admitting: Family Medicine

## 2020-10-03 ENCOUNTER — Other Ambulatory Visit: Payer: Self-pay | Admitting: *Deleted

## 2020-10-03 MED ORDER — SUCRALFATE 1 GM/10ML PO SUSP: 1.0000 g | Freq: Three times a day (TID) | ORAL | 0 refills | Status: DC

## 2020-10-03 NOTE — Progress Notes (Signed)
Patient presented to the office on 09-30-2020 to get the diabetic inserts adjusted.  I shaved down the inserts like the ones she had in her shoes and patient was satisfied with the inserts and I stated to call the office if any concerns or questions.

## 2020-10-03 NOTE — Telephone Encounter (Signed)
Ok to refill. If not improving please schedule appt

## 2020-11-16 ENCOUNTER — Other Ambulatory Visit: Payer: Self-pay | Admitting: Family Medicine

## 2020-11-16 ENCOUNTER — Encounter: Payer: Self-pay | Admitting: Family Medicine

## 2020-11-16 MED ORDER — FLUCONAZOLE 150 MG PO TABS: ORAL_TABLET | ORAL | 3 refills | Status: DC

## 2020-11-16 NOTE — Telephone Encounter (Signed)
Med sent.

## 2020-11-24 ENCOUNTER — Ambulatory Visit: Payer: Medicare Other | Admitting: Family Medicine

## 2020-11-29 ENCOUNTER — Ambulatory Visit: Payer: Medicare Other | Admitting: Family Medicine

## 2020-12-09 ENCOUNTER — Encounter: Payer: Self-pay | Admitting: Family Medicine

## 2020-12-09 ENCOUNTER — Telehealth: Payer: Self-pay | Admitting: Family Medicine

## 2020-12-09 ENCOUNTER — Ambulatory Visit (INDEPENDENT_AMBULATORY_CARE_PROVIDER_SITE_OTHER): Payer: Medicare Other | Admitting: Family Medicine

## 2020-12-09 ENCOUNTER — Other Ambulatory Visit: Payer: Self-pay

## 2020-12-09 VITALS — BP 146/43 | HR 57 | Ht 61.0 in | Wt 338.0 lb

## 2020-12-09 DIAGNOSIS — I700 Atherosclerosis of aorta: Secondary | ICD-10-CM | POA: Diagnosis not present

## 2020-12-09 DIAGNOSIS — E1149 Type 2 diabetes mellitus with other diabetic neurological complication: Secondary | ICD-10-CM

## 2020-12-09 DIAGNOSIS — E1165 Type 2 diabetes mellitus with hyperglycemia: Secondary | ICD-10-CM | POA: Diagnosis not present

## 2020-12-09 DIAGNOSIS — M170 Bilateral primary osteoarthritis of knee: Secondary | ICD-10-CM | POA: Diagnosis not present

## 2020-12-09 DIAGNOSIS — I10 Essential (primary) hypertension: Secondary | ICD-10-CM

## 2020-12-09 LAB — POCT GLYCOSYLATED HEMOGLOBIN (HGB A1C): Hemoglobin A1C: 7 % — AB (ref 4.0–5.6)

## 2020-12-09 MED ORDER — TRULICITY 3 MG/0.5ML ~~LOC~~ SOAJ: 3.0000 mg | SUBCUTANEOUS | 1 refills | Status: DC

## 2020-12-09 MED ORDER — ROSUVASTATIN CALCIUM 10 MG PO TABS: 10.0000 mg | ORAL_TABLET | ORAL | 3 refills | Status: DC

## 2020-12-09 NOTE — Assessment & Plan Note (Signed)
Repeat blood pressure improved but not at goal it looked fantastic when she was here last time so for today regarding hold off and then plan to recheck at next visit if it still elevated at that time then we do have room to go up on lisinopril to 40 mg if needed.

## 2020-12-09 NOTE — Assessment & Plan Note (Signed)
She has actually never had a steroid injection in the knees I think this would potentially be helpful option for her it might actually provide some relief or even considering Synvisc or gel Orthovisc into the knees.  Encouraged her to consider these options gave her some additional information.  Encouraged her to schedule with our sports medicine doctor or with orthopedist.

## 2020-12-09 NOTE — Assessment & Plan Note (Signed)
A1c back up to 7.0.  She is currently been hovering about the same place for the last year.  We discussed increasing her Trulicity versus might possibly switching to Ozempic we have been having a little bit of problem getting Ozempic lately because of the backorder.  Tolerates well then consider going up to the maximum dose.  F/U in 3 months.

## 2020-12-09 NOTE — Assessment & Plan Note (Signed)
Goal to get BMI down to 40 so she can have knee replacement.  Discussed options. For now will try increasing her trulicity. Offered to consider a bariatric program.

## 2020-12-09 NOTE — Assessment & Plan Note (Signed)
A1c today 7.0.  Ups slightly from previous in February at 6.9.

## 2020-12-09 NOTE — Assessment & Plan Note (Signed)
Continue statin. 

## 2020-12-09 NOTE — Progress Notes (Signed)
Established Patient Office Visit  Subjective:  Patient ID: Cinthya Bors, female    DOB: September 25, 1950  Age: 70 y.o. MRN: [id number]  CC:  Chief Complaint  Patient presents with   Diabetes    HPI Parrie Rasco presents for   Diabetes - no hypoglycemic events. No wounds or sores that are not healing well. No increased thirst or urination. Checking glucose at home. Taking medications as prescribed without any side effects.  Hypertension- Pt denies chest pain, SOB, dizziness, or heart palpitations.  Taking meds as directed w/o problems.  Denies medication side effects.    Knee pain, bilat - she is using Tylenol and CBD oil which helps some.  She knows ultimately she needs knee replacement.  Morbid obesity-she is actually done a phenomenal job with her weight loss.  She was 357 in November 2020 and she is now down to 330 lb.   Past Medical History:  Diagnosis Date   Allergy    to cats and dogs   Diabetes mellitus    Hiatal hernia    and delayed gastric emptying/ sees [facility] for recurrent diarrhea   History of cardiovascular disorder 12/10/2008   Qualifier: Diagnosis of  By: Madilyn Fireman MD, Anasha Perfecto     Hypertension    OSA (obstructive sleep apnea)    CPAP 75mmhg   Pedal edema    Spastic bladder    urology in [place]   TIA (transient ischemic attack)    hx of- sees Dr Elwin Mocha (Neurology)    Past Surgical History:  Procedure Laterality Date   vaginal skin tag removal      Family History  Problem Relation Age of Onset   Diabetes Mother    Hypertension Mother    Glaucoma Mother    Cancer Mother        bone cancer   Alcohol abuse Father    Emphysema Father    Other Father        CHF   Ovarian cancer Sister     Social History   Socioeconomic History   Marital status: Married    Spouse name: Clifton James   Number of children: 2   Years of education: Masters   Highest education level: Master's degree (e.g., MA, MS, MEng, MEd, MSW, MBA)  Occupational History    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  Retired and works 1 day a week for her daughter just to be out and about. Enjoys reading and doing crafts in her free time.   Social Determinants of Health   Financial Resource Strain: Low Risk    Difficulty of Paying Living Expenses: Not hard at all  Food Insecurity: No Food Insecurity   Worried About Charity fundraiser in the Last Year: Never true   [place] in the Last Year: Never true  Transportation Needs: No Transportation Needs   Lack of Transportation (Medical): No   Lack of Transportation (Non-Medical): No  Physical Activity: Inactive   Days of Exercise per Week: 0 days   Minutes of  Exercise per Session: 0 min  Stress: No Stress Concern Present   Feeling of Stress : Not at all  Social Connections: Moderately Integrated   Frequency of Communication with Friends and Family: More than three times a week   Frequency of Social Gatherings with Friends and Family: Twice a week   Attends Religious Services: Never   Marine scientist or Organizations: Yes   Attends Music therapist: More than 4 times per year   Marital Status: Married  Human resources officer Violence: Not At Risk   Fear of Current or Ex-Partner: No   Emotionally Abused: No   Physically Abused: No   Sexually Abused: No    Outpatient Medications Prior to Visit  Medication Sig Dispense Refill   acetaminophen (TYLENOL) 650 MG CR tablet Take 650 mg by mouth every 8 (eight) hours as needed.     AMBULATORY NON FORMULARY MEDICATION Medication Name: custom fit knee high compression stocking with 15-20  mmHg pressure. Open toe preferred. 1 vial 0   Armodafinil 250 MG tablet Take by mouth. PRN     aspirin 325 MG tablet Take 325 mg by mouth daily.     cetirizine (ZYRTEC) 10 MG tablet Take 10 mg by mouth daily.     chlorthalidone (HYGROTON) 25 MG tablet TAKE 1 TABLET DAILY 90 tablet 3   ciclopirox (PENLAC) 8 % solution Apply topically at bedtime. Apply over nail and surrounding skin. Apply daily over previous coat. After seven (7) days, may remove with alcohol and continue cycle. 6.6 mL 4   clobetasol cream (TEMOVATE) 6.26 % Apply 1 application topically 2 (two) times daily. 45 g 1   clopidogrel (PLAVIX) 75 MG tablet TAKE 1 TABLET DAILY 90 tablet 3   diltiazem (CARDIZEM CD) 360 MG 24 hr capsule TAKE 1 CAPSULE(360 MG) BY MOUTH DAILY 90 capsule 0   Ferrous Sulfate Dried 200 (65 Fe) MG TABS Take 1 tablet by mouth daily.     fluconazole (DIFLUCAN) 150 MG tablet TAKE 1 TABLET BY MOUTH ONCE She is taking it PRN 7 tablet 3   Insulin Pen Needle (NOVOFINE) 30G X 8 MM MISC For use each time injection for insulin 100 each 4   JARDIANCE 25 MG TABS tablet TAKE 1 TABLET DAILY 90 tablet 3   lactase (LACTAID) 3000 UNITS tablet Take 3,000 Units by mouth as needed.      LANTUS 100 UNIT/ML injection INJECT 35 UNITS UNDER THE SKIN DAILY 40 mL 3   latanoprost (XALATAN) 0.005 % ophthalmic solution 1 drop.     lisinopril (ZESTRIL) 20 MG tablet Take 1 tablet (20 mg total) by mouth daily. 90 tablet 3   meclizine (ANTIVERT) 25 MG tablet Take 1 tablet (25 mg total) by mouth 3 (three) times daily as needed for dizziness. 90 tablet 1   metFORMIN (GLUCOPHAGE) 1000 MG tablet TAKE 1 TABLET TWICE A DAY WITH MEALS 180 tablet 3   Misc. Devices MISC Transfer of care to [facility].  Please provide service to her current CPAP machine for OSA at 14 cm. water pressure.  Send directly to [place] at [facility].     Omega-3 Fatty Acids (FISH OIL) 1200 MG CAPS Take 1 capsule by mouth daily.     pantoprazole (PROTONIX) 40 MG tablet TAKE 1 TABLET  DAILY 90 tablet 3   sucralfate (CARAFATE) 1 GM/10ML suspension Take 10 mLs (1 g total) by mouth 4 (four) times daily -  with meals and at bedtime. 420 mL 0   rosuvastatin (  CRESTOR) 10 MG tablet Take 1 tablet (10 mg total) by mouth once a week. At bedtime.  0   TRULICITY 1.5 CL/2.7NT SOPN INJECT 1.5 MG UNDER THE SKIN EVERY 7 DAYS 6 mL 3   No facility-administered medications prior to visit.    Allergies  Allergen Reactions   Atorvastatin Other (See Comments)    Patient stated muscle weakness Patient stated muscle weakness   Beclomethasone Other (See Comments)    unknown unknown   Cefaclor Hives   Pioglitazone Other (See Comments)    Mouth sores Mouth sores Mouth sores unknown REACTION: ulcers in the mouth   Pravastatin Other (See Comments)    Chest pain.  Chest pain.    Sulfa Antibiotics Hives    Other reaction(s): Agitation   Cephalexin    Cephalexin Other (See Comments)    unknown   Lipitor [Atorvastatin Calcium] Other (See Comments)    myalgias   Statins Other (See Comments)    Muscle pain Other reaction(s): Other (See Comments) Muscle pain    ROS Review of Systems    Objective:    Physical Exam  BP (!) 146/43   Pulse (!) 57   Ht 5\' 1"  (1.549 m)   Wt (!) 338 lb (153.3 kg)   SpO2 100%   BMI 63.86 kg/m  Wt Readings from Last 3 Encounters:  12/09/20 (!) 338 lb (153.3 kg)  08/29/20 (!) 348 lb (157.9 kg)  05/05/20 (!) 348 lb (157.9 kg)     Health Maintenance Due  Topic Date Due   COVID-19 Vaccine (4 - Booster for Pfizer series) 08/21/2020    There are no preventive care reminders to display for this patient.  Lab Results  Component Value Date   TSH 2.28 09/22/2019   Lab Results  Component Value Date   WBC 9.4 09/23/2020   HGB 14.6 09/23/2020   HCT 43.9 09/23/2020   MCV 86.6 09/23/2020   PLT 332 09/23/2020   Lab Results  Component Value Date   NA 143 09/23/2020   NA 142 09/23/2020   K 4.4 09/23/2020   K 4.3 09/23/2020   CO2 31  09/23/2020   CO2 29 09/23/2020   GLUCOSE 181 (H) 09/23/2020   GLUCOSE 184 (H) 09/23/2020   BUN 17 09/23/2020   BUN 17 09/23/2020   CREATININE 0.78 09/23/2020   CREATININE 0.79 09/23/2020   BILITOT 0.4 09/23/2020   ALKPHOS 71 06/08/2016   AST 19 09/23/2020   ALT 18 09/23/2020   PROT 7.3 09/23/2020   ALBUMIN 4.3 06/08/2016   CALCIUM 9.7 09/23/2020   CALCIUM 9.9 09/23/2020   ANIONGAP 7 05/09/2015   Lab Results  Component Value Date   CHOL 163 09/22/2019   Lab Results  Component Value Date   HDL 52 09/22/2019   Lab Results  Component Value Date   LDLCALC 90 09/22/2019   Lab Results  Component Value Date   TRIG 109 09/22/2019   Lab Results  Component Value Date   CHOLHDL 3.1 09/22/2019   Lab Results  Component Value Date   HGBA1C 7.0 (A) 12/09/2020      Assessment & Plan:   Problem List Items Addressed This Visit       Cardiovascular and Mediastinum   Essential hypertension, benign    Repeat blood pressure improved but not at goal it looked fantastic when she was here last time so for today regarding hold off and then plan to recheck at next visit if it still elevated at that time then  we do have room to go up on lisinopril to 40 mg if needed.       Relevant Medications   rosuvastatin (CRESTOR) 10 MG tablet   Aortic atherosclerosis (HCC)    Continue statin       Relevant Medications   rosuvastatin (CRESTOR) 10 MG tablet     Endocrine   Type 2 diabetes mellitus with neurological complications (HCC)    W9U today 7.0.  Ups slightly from previous in February at 6.9.       Relevant Medications   rosuvastatin (CRESTOR) 10 MG tablet   Dulaglutide (TRULICITY) 3 EA/5.4UJ SOPN   Diabetes type 2, uncontrolled (HCC) - Primary    A1c back up to 7.0.  She is currently been hovering about the same place for the last year.  We discussed increasing her Trulicity versus might possibly switching to Ozempic we have been having a little bit of problem getting Ozempic  lately because of the backorder.  Tolerates well then consider going up to the maximum dose.  F/U in 3 months.         Relevant Medications   rosuvastatin (CRESTOR) 10 MG tablet   Dulaglutide (TRULICITY) 3 WJ/1.9JY SOPN   Other Relevant Orders   POCT glycosylated hemoglobin (Hb A1C) (Completed)     Musculoskeletal and Integument   Primary osteoarthritis of both knees    She has actually never had a steroid injection in the knees I think this would potentially be helpful option for her it might actually provide some relief or even considering Synvisc or gel Orthovisc into the knees.  Encouraged her to consider these options gave her some additional information.  Encouraged her to schedule with our sports medicine doctor or with orthopedist.         Other   Morbid obesity ([place])    Goal to get BMI down to 40 so she can have knee replacement.  Discussed options. For now will try increasing her trulicity. Offered to consider a bariatric program.         Relevant Medications   Dulaglutide (TRULICITY) 3 NW/2.9FA SOPN    Meds ordered this encounter  Medications   rosuvastatin (CRESTOR) 10 MG tablet    Sig: Take 1 tablet (10 mg total) by mouth once a week. At bedtime.    Dispense:  12 tablet    Refill:  3   Dulaglutide (TRULICITY) 3 OZ/3.0QM SOPN    Sig: Inject 3 mg as directed once a week.    Dispense:  12 mL    Refill:  1     Follow-up: Return in about 3 months (around 03/11/2021) for Diabetes follow-up, Hypertension.    Beatrice Lecher, MD

## 2020-12-09 NOTE — Telephone Encounter (Signed)
Please call Dr. Maudie Mercury at 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  I feel like she probably needs a repeat but she does see Dr. Maudie Mercury yearly and I just want make sure that they are aware and see if they would like to go ahead and make a recommendation to be evaluated again.  We are also more than happy to order if they would like.

## 2020-12-14 NOTE — Telephone Encounter (Signed)
I spoke with Dr Julianne Rice office. They will ask Dr Maudie Mercury and give [place] a call back.

## 2020-12-14 NOTE — Telephone Encounter (Signed)
Dr. Maudie Mercury called and said that pt does not need a repeat Echo.

## 2020-12-26 NOTE — Telephone Encounter (Signed)
Okay, thank you.  Please call patient and give her the update as well.

## 2021-01-03 DIAGNOSIS — Z20822 Contact with and (suspected) exposure to covid-19: Secondary | ICD-10-CM | POA: Diagnosis not present

## 2021-01-30 ENCOUNTER — Ambulatory Visit (INDEPENDENT_AMBULATORY_CARE_PROVIDER_SITE_OTHER): Payer: Medicare Other | Admitting: Podiatry

## 2021-01-30 ENCOUNTER — Other Ambulatory Visit: Payer: Self-pay

## 2021-01-30 ENCOUNTER — Encounter: Payer: Self-pay | Admitting: Podiatry

## 2021-01-30 DIAGNOSIS — B351 Tinea unguium: Secondary | ICD-10-CM

## 2021-01-30 DIAGNOSIS — M79674 Pain in right toe(s): Secondary | ICD-10-CM | POA: Diagnosis not present

## 2021-01-30 DIAGNOSIS — M79675 Pain in left toe(s): Secondary | ICD-10-CM | POA: Diagnosis not present

## 2021-01-30 DIAGNOSIS — Z7901 Long term (current) use of anticoagulants: Secondary | ICD-10-CM

## 2021-01-30 DIAGNOSIS — L84 Corns and callosities: Secondary | ICD-10-CM | POA: Diagnosis not present

## 2021-02-01 NOTE — Progress Notes (Signed)
Subjective: 70 y.o. returns the office today for painful, elongated, thickened toenails which they cannot trim herself. Denies any redness or drainage around the nails.  Also has calluses to her feet which she would like to have trimmed.  Denies any acute changes since last appointment and no new complaints today. Denies any systemic complaints such as fevers, chills, nausea, vomiting.   PCP: Hali Marry, MD Last seen December 09, 2020 A1c: 7 on 12/09/2020  Objective: AAO 3, NAD DP/PT pulses palpable, CRT less than 3 seconds Protective sensation decreased with Simms Weinstein monofilament Nails hypertrophic, dystrophic, elongated, brittle, discolored 10. There is tenderness overlying the nails 1-5 bilaterally. There is no surrounding erythema or drainage along the nail sites. Hyperkeratotic lesion right foot submetatarsal 5 comfort metatarsal base as well as the heel.  No underlying ulceration drainage or any signs of infection noted today.  Calluses appear to be improved compared to last appointment. No open lesions or other pre-ulcerative lesions are identified. No pain with calf compression, swelling, warmth, erythema.  Assessment: Patient presents with symptomatic onychomycosis, hyperkeratotic lesions  Plan: -Treatment options including alternatives, risks, complications were discussed -Nails sharply debrided 10 without complication/bleeding. -Hyperkeratotic lesion sharply debrided x3 without any complications or bleeding.  Recommend moisturizer daily.  -Discussed daily foot inspection. If there are any changes, to call the office immediately.  -Follow-up in 3 months or sooner if any problems are to arise. In the meantime, encouraged to call the office with any questions, concerns, changes symptoms.  Celesta Gentile, DPM

## 2021-03-10 ENCOUNTER — Ambulatory Visit: Payer: Medicare Other | Admitting: Family Medicine

## 2021-03-21 ENCOUNTER — Other Ambulatory Visit: Payer: Self-pay | Admitting: Family Medicine

## 2021-03-21 DIAGNOSIS — Z9989 Dependence on other enabling machines and devices: Secondary | ICD-10-CM | POA: Diagnosis not present

## 2021-03-21 DIAGNOSIS — Z6841 Body Mass Index (BMI) 40.0 and over, adult: Secondary | ICD-10-CM | POA: Diagnosis not present

## 2021-03-21 DIAGNOSIS — G4733 Obstructive sleep apnea (adult) (pediatric): Secondary | ICD-10-CM | POA: Diagnosis not present

## 2021-03-28 ENCOUNTER — Ambulatory Visit (INDEPENDENT_AMBULATORY_CARE_PROVIDER_SITE_OTHER): Payer: Medicare Other | Admitting: Family Medicine

## 2021-03-28 ENCOUNTER — Encounter: Payer: Self-pay | Admitting: Family Medicine

## 2021-03-28 VITALS — BP 111/41 | HR 60 | Ht 61.0 in | Wt 327.0 lb

## 2021-03-28 DIAGNOSIS — I10 Essential (primary) hypertension: Secondary | ICD-10-CM | POA: Diagnosis not present

## 2021-03-28 DIAGNOSIS — Z23 Encounter for immunization: Secondary | ICD-10-CM

## 2021-03-28 DIAGNOSIS — E1149 Type 2 diabetes mellitus with other diabetic neurological complication: Secondary | ICD-10-CM | POA: Diagnosis not present

## 2021-03-28 DIAGNOSIS — E785 Hyperlipidemia, unspecified: Secondary | ICD-10-CM | POA: Diagnosis not present

## 2021-03-28 LAB — POCT GLYCOSYLATED HEMOGLOBIN (HGB A1C): Hemoglobin A1C: 6.4 % — AB (ref 4.0–5.6)

## 2021-03-28 NOTE — Assessment & Plan Note (Signed)
Due to recheck lipids.  Intolerant to statins.

## 2021-03-28 NOTE — Assessment & Plan Note (Signed)
Her A1c looks phenomenal today at 6.4 she has really done a fantastic job since I last saw her A1c is improved, blood pressures are improved, and her weight is down.  Continue with current regimen for now she has been able to decrease her Lantus down to 25 units if she starts noticing some lower blood sugars we may be able to continue to wean the Lantus completely which would be my goal for her.  Also like to maybe even be able to get her metformin dose decreased slightly.  For now continue with metformin, Jardiance, Trulicity and 25 units of Lantus daily.  Plan to follow-up in 3 months.  Monitor for hypoglycemia.

## 2021-03-28 NOTE — Assessment & Plan Note (Addendum)
Pressure is actually a little bit low today.  She has lost 11 pounds we might even be able to decrease her medication regimen. Will have her cut her lisinopril in half and which would be 10 mg daily and monitor her home blood pressure over the next couple weeks if it still looks good then we can always change her prescription to the tens.

## 2021-03-28 NOTE — Progress Notes (Signed)
Established Patient Office Visit  Subjective:  Patient ID: Natalie Wright, female    DOB: 12-Jun-1951  Age: 70 y.o. MRN: [id number]  CC:  Chief Complaint  Patient presents with   Diabetes    HPI Meriah Shands presents for   Diabetes - no hypoglycemic events. No wounds or sores that are not healing well. No increased thirst or urination. Checking glucose at home. Taking medications as prescribed without any side effects.  She has tolerated the increased dose on the Trulicity she is now up to 3 mg and doing really well. Scheduled for eye exam. She has dec her lantus to 25 nits and sometimes will adjust it if eating lightly.   Hypertension- Pt denies chest pain, SOB, dizziness, or heart palpitations.  Taking meds as directed w/o problems.  Denies medication side effects.     She has really been working on trying to lose weight and eating healthier and eating smaller portions she is lost 11 pounds since I last saw her and says she has noticed a lot of improvements her incontinence has improved greatly some of her knee pain has actually decreased.  She just feels like she has a little bit more stamina doing housework.  Past Medical History:  Diagnosis Date   Allergy    to cats and dogs   Diabetes mellitus    Hiatal hernia    and delayed gastric emptying/ sees [facility] for recurrent diarrhea   History of cardiovascular disorder 12/10/2008   Qualifier: Diagnosis of  By: Madilyn Fireman MD, Kynslei Art     Hypertension    OSA (obstructive sleep apnea)    CPAP 30mmhg   Pedal edema    Spastic bladder    urology in [place]   TIA (transient ischemic attack)    hx of- sees Dr Elwin Mocha (Neurology)    Past Surgical History:  Procedure Laterality Date   vaginal skin tag removal      Family History  Problem Relation Age of Onset   Diabetes Mother    Hypertension Mother    Glaucoma Mother    Cancer Mother        bone cancer   Alcohol abuse Father    Emphysema Father    Other Father         CHF   Ovarian cancer Sister     Social History   Socioeconomic History   Marital status: Married    Spouse name: Clifton James   Number of children: 2   Years of education: Masters   Highest education level: Master's degree (e.g., MA, MS, MEng, MEd, MSW, MBA)  Occupational History   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  Retired and works 1 day a week for her daughter just to be out and about. Enjoys reading and doing crafts in her free time.   Social Determinants of Health   Financial Resource Strain: Low Risk    Difficulty of Paying Living Expenses: Not hard at all  Food Insecurity: No Food Insecurity   Worried About Charity fundraiser in the Last Year: Never true  Ran Out of Food in the Last Year: Never true  Transportation Needs: No Transportation Needs   Lack of Transportation (Medical): No   Lack of Transportation (Non-Medical): No  Physical Activity: Inactive   Days of Exercise per Week: 0 days   Minutes of Exercise per Session: 0 min  Stress: No Stress Concern Present   Feeling of Stress : Not at all  Social Connections: Moderately Integrated   Frequency of Communication with Friends and Family: More than three times a week   Frequency of Social Gatherings with Friends and Family: Twice a week   Attends Religious Services: Never   Marine scientist or Organizations: Yes   Attends Music therapist: More than 4 times per year   Marital Status: Married  Human resources officer Violence: Not At Risk   Fear of Current or Ex-Partner: No   Emotionally Abused: No   Physically Abused: No   Sexually Abused: No    Outpatient  Medications Prior to Visit  Medication Sig Dispense Refill   acetaminophen (TYLENOL) 650 MG CR tablet Take 650 mg by mouth every 8 (eight) hours as needed.     AMBULATORY NON FORMULARY MEDICATION Medication Name: custom fit knee high compression stocking with 15-20 mmHg pressure. Open toe preferred. 1 vial 0   aspirin 325 MG tablet Take 325 mg by mouth daily.     cetirizine (ZYRTEC) 10 MG tablet Take 10 mg by mouth daily.     chlorthalidone (HYGROTON) 25 MG tablet TAKE 1 TABLET DAILY 90 tablet 3   clobetasol cream (TEMOVATE) 1.94 % Apply 1 application topically 2 (two) times daily. 45 g 1   clopidogrel (PLAVIX) 75 MG tablet TAKE 1 TABLET DAILY 90 tablet 3   diltiazem (CARDIZEM CD) 360 MG 24 hr capsule TAKE 1 CAPSULE(360 MG) BY MOUTH DAILY 90 capsule 0   Dulaglutide (TRULICITY) 3 RD/4.0CX SOPN Inject 3 mg as directed once a week. 12 mL 1   Ferrous Sulfate Dried 200 (65 Fe) MG TABS Take 1 tablet by mouth daily.     fluconazole (DIFLUCAN) 150 MG tablet TAKE 1 TABLET BY MOUTH ONCE She is taking it PRN 7 tablet 3   Insulin Pen Needle (NOVOFINE) 30G X 8 MM MISC For use each time injection for insulin 100 each 4   JARDIANCE 25 MG TABS tablet TAKE 1 TABLET DAILY 90 tablet 3   lactase (LACTAID) 3000 UNITS tablet Take 3,000 Units by mouth as needed.      LANTUS 100 UNIT/ML injection INJECT 35 UNITS UNDER THE SKIN DAILY 40 mL 3   latanoprost (XALATAN) 0.005 % ophthalmic solution 1 drop.     lisinopril (ZESTRIL) 20 MG tablet Take 1 tablet (20 mg total) by mouth daily. 90 tablet 3   meclizine (ANTIVERT) 25 MG tablet Take 1 tablet (25 mg total) by mouth 3 (three) times daily as needed for dizziness. 90 tablet 1   metFORMIN (GLUCOPHAGE) 1000 MG tablet TAKE 1 TABLET TWICE A DAY WITH MEALS 180 tablet 3   Misc. Devices MISC Transfer of care to [facility].  Please provide service to her current CPAP machine for OSA at 14 cm. water pressure.  Send directly to [place] at [facility].     Omega-3 Fatty Acids (FISH OIL)  1200 MG CAPS Take 1 capsule by mouth daily.     pantoprazole (PROTONIX) 40 MG tablet TAKE 1 TABLET DAILY 90 tablet 3   rosuvastatin (CRESTOR) 10 MG tablet Take 1 tablet (10 mg total) by mouth  once a week. At bedtime. 12 tablet 3   sucralfate (CARAFATE) 1 GM/10ML suspension Take 10 mLs (1 g total) by mouth 4 (four) times daily -  with meals and at bedtime. 420 mL 0   Armodafinil 250 MG tablet Take by mouth. PRN     ciclopirox (PENLAC) 8 % solution Apply topically at bedtime. Apply over nail and surrounding skin. Apply daily over previous coat. After seven (7) days, may remove with alcohol and continue cycle. 6.6 mL 4   No facility-administered medications prior to visit.    Allergies  Allergen Reactions   Atorvastatin Other (See Comments)    Patient stated muscle weakness Patient stated muscle weakness   Beclomethasone Other (See Comments)    unknown unknown   Cefaclor Hives   Pioglitazone Other (See Comments)    Mouth sores Mouth sores Mouth sores unknown REACTION: ulcers in the mouth   Pravastatin Other (See Comments)    Chest pain.  Chest pain.    Sulfa Antibiotics Hives    Other reaction(s): Agitation   Cephalexin    Cephalexin Other (See Comments)    unknown   Lipitor [Atorvastatin Calcium] Other (See Comments)    myalgias   Statins Other (See Comments)    Muscle pain Other reaction(s): Other (See Comments) Muscle pain    ROS Review of Systems    Objective:    Physical Exam Constitutional:      Appearance: Normal appearance. She is well-developed.  HENT:     Head: Normocephalic and atraumatic.  Cardiovascular:     Rate and Rhythm: Normal rate and regular rhythm.     Heart sounds: Normal heart sounds.  Pulmonary:     Effort: Pulmonary effort is normal.     Breath sounds: Normal breath sounds.  Skin:    General: Skin is warm and dry.  Neurological:     Mental Status: She is alert and oriented to person, place, and time.  Psychiatric:        Behavior:  Behavior normal.    BP (!) 111/41   Pulse 60   Ht 5\' 1"  (1.549 m)   Wt (!) 327 lb (148.3 kg)   SpO2 98%   BMI 61.79 kg/m  Wt Readings from Last 3 Encounters:  03/28/21 (!) 327 lb (148.3 kg)  12/09/20 (!) 338 lb (153.3 kg)  08/29/20 (!) 348 lb (157.9 kg)     There are no preventive care reminders to display for this patient.   There are no preventive care reminders to display for this patient.  Lab Results  Component Value Date   TSH 2.28 09/22/2019   Lab Results  Component Value Date   WBC 9.4 09/23/2020   HGB 14.6 09/23/2020   HCT 43.9 09/23/2020   MCV 86.6 09/23/2020   PLT 332 09/23/2020   Lab Results  Component Value Date   NA 143 09/23/2020   NA 142 09/23/2020   K 4.4 09/23/2020   K 4.3 09/23/2020   CO2 31 09/23/2020   CO2 29 09/23/2020   GLUCOSE 181 (H) 09/23/2020   GLUCOSE 184 (H) 09/23/2020   BUN 17 09/23/2020   BUN 17 09/23/2020   CREATININE 0.78 09/23/2020   CREATININE 0.79 09/23/2020   BILITOT 0.4 09/23/2020   ALKPHOS 71 06/08/2016   AST 19 09/23/2020   ALT 18 09/23/2020   PROT 7.3 09/23/2020   ALBUMIN 4.3 06/08/2016   CALCIUM 9.7 09/23/2020   CALCIUM 9.9 09/23/2020   ANIONGAP 7 05/09/2015   Lab Results  Component Value Date   CHOL 163 09/22/2019   Lab Results  Component Value Date   HDL 52 09/22/2019   Lab Results  Component Value Date   LDLCALC 90 09/22/2019   Lab Results  Component Value Date   TRIG 109 09/22/2019   Lab Results  Component Value Date   CHOLHDL 3.1 09/22/2019   Lab Results  Component Value Date   HGBA1C 6.4 (A) 03/28/2021      Assessment & Plan:   Problem List Items Addressed This Visit       Cardiovascular and Mediastinum   Essential hypertension, benign - Primary    Pressure is actually a little bit low today.  She has lost 11 pounds we might even be able to decrease her medication regimen. Will have her cut her lisinopril in half and which would be 10 mg daily and monitor her home blood pressure  over the next couple weeks if it still looks good then we can always change her prescription to the tens.      Relevant Orders   COMPLETE METABOLIC PANEL WITH GFR   Lipid panel     Endocrine   Type 2 diabetes mellitus with neurological complications (HCC)    Her A1c looks phenomenal today at 6.4 she has really done a fantastic job since I last saw her A1c is improved, blood pressures are improved, and her weight is down.  Continue with current regimen for now she has been able to decrease her Lantus down to 25 units if she starts noticing some lower blood sugars we may be able to continue to wean the Lantus completely which would be my goal for her.  Also like to maybe even be able to get her metformin dose decreased slightly.  For now continue with metformin, Jardiance, Trulicity and 25 units of Lantus daily.  Plan to follow-up in 3 months.  Monitor for hypoglycemia.      Relevant Orders   POCT glycosylated hemoglobin (Hb A1C) (Completed)   COMPLETE METABOLIC PANEL WITH GFR   Lipid panel     Other   Hyperlipidemia    Due to recheck lipids.  Intolerant to statins.      Other Visit Diagnoses     Need for immunization against influenza       Relevant Orders   Flu Vaccine QUAD High Dose(Fluad) (Completed)       No orders of the defined types were placed in this encounter.   Follow-up: Return in about 3 months (around 07/03/2021) for Diabetes follow-up.    Beatrice Lecher, MD

## 2021-03-30 DIAGNOSIS — H527 Unspecified disorder of refraction: Secondary | ICD-10-CM | POA: Diagnosis not present

## 2021-03-30 DIAGNOSIS — H40003 Preglaucoma, unspecified, bilateral: Secondary | ICD-10-CM | POA: Diagnosis not present

## 2021-03-30 LAB — HM DIABETES EYE EXAM

## 2021-04-20 ENCOUNTER — Encounter: Payer: Self-pay | Admitting: Family Medicine

## 2021-04-21 ENCOUNTER — Other Ambulatory Visit: Payer: Self-pay | Admitting: Family Medicine

## 2021-04-21 DIAGNOSIS — Z1231 Encounter for screening mammogram for malignant neoplasm of breast: Secondary | ICD-10-CM

## 2021-04-21 MED ORDER — MECLIZINE HCL 25 MG PO TABS: 25.0000 mg | ORAL_TABLET | Freq: Three times a day (TID) | ORAL | 1 refills | Status: DC | PRN

## 2021-04-28 DIAGNOSIS — I471 Supraventricular tachycardia: Secondary | ICD-10-CM | POA: Diagnosis not present

## 2021-05-02 ENCOUNTER — Other Ambulatory Visit: Payer: Self-pay

## 2021-05-02 ENCOUNTER — Encounter: Payer: Self-pay | Admitting: Podiatry

## 2021-05-02 ENCOUNTER — Ambulatory Visit (INDEPENDENT_AMBULATORY_CARE_PROVIDER_SITE_OTHER): Payer: Medicare Other | Admitting: Podiatry

## 2021-05-02 DIAGNOSIS — Z7901 Long term (current) use of anticoagulants: Secondary | ICD-10-CM | POA: Diagnosis not present

## 2021-05-02 DIAGNOSIS — M79674 Pain in right toe(s): Secondary | ICD-10-CM

## 2021-05-02 DIAGNOSIS — L84 Corns and callosities: Secondary | ICD-10-CM | POA: Diagnosis not present

## 2021-05-02 DIAGNOSIS — B351 Tinea unguium: Secondary | ICD-10-CM | POA: Diagnosis not present

## 2021-05-02 DIAGNOSIS — M79675 Pain in left toe(s): Secondary | ICD-10-CM | POA: Diagnosis not present

## 2021-05-02 NOTE — Progress Notes (Signed)
Subjective: 70 y.o. returns the office today for painful, elongated, thickened toenails which they cannot trim herself. Denies any redness or drainage around the nails. Calluses are also causing discomfort. No open lesions. No swelling, redness, drainage.  Denies any systemic complaints such as fevers, chills, nausea, vomiting.   PCP: Hali Marry, MD Last seen 03/28/2021 A1c: 6.4 on 03/28/2021  Objective: AAO 3, NAD DP/PT pulses palpable, CRT less than 3 seconds Protective sensation decreased with Simms Weinstein monofilament Nails hypertrophic, dystrophic, elongated, brittle, discolored 10. There is tenderness overlying the nails 1-5 bilaterally. There is no surrounding erythema or drainage along the nail sites. Hyperkeratotic lesion right foot submetatarsal 5, metatarsal base as well as the heel.  No underlying ulceration drainage or any signs of infection noted today.  No open lesions or other pre-ulcerative lesions are identified. No pain with calf compression, swelling, warmth, erythema.  Assessment: Patient presents with symptomatic onychomycosis, hyperkeratotic lesions  Plan: -Treatment options including alternatives, risks, complications were discussed -Nails sharply debrided 10 without complication/bleeding. -Hyperkeratotic lesion sharply debrided x3 without any complications or bleeding.  Recommend moisturizer daily.  -Discussed daily foot inspection. If there are any changes, to call the office immediately.  -Follow-up in 3 months or sooner if any problems are to arise. In the meantime, encouraged to call the office with any questions, concerns, changes symptoms.  Celesta Gentile, DPM

## 2021-05-18 ENCOUNTER — Ambulatory Visit (INDEPENDENT_AMBULATORY_CARE_PROVIDER_SITE_OTHER): Payer: Medicare Other

## 2021-05-18 ENCOUNTER — Other Ambulatory Visit: Payer: Self-pay

## 2021-05-18 DIAGNOSIS — E1149 Type 2 diabetes mellitus with other diabetic neurological complication: Secondary | ICD-10-CM | POA: Diagnosis not present

## 2021-05-18 DIAGNOSIS — Z1231 Encounter for screening mammogram for malignant neoplasm of breast: Secondary | ICD-10-CM | POA: Diagnosis not present

## 2021-05-18 DIAGNOSIS — I10 Essential (primary) hypertension: Secondary | ICD-10-CM | POA: Diagnosis not present

## 2021-05-19 ENCOUNTER — Other Ambulatory Visit: Payer: Self-pay | Admitting: Family Medicine

## 2021-05-19 DIAGNOSIS — R928 Other abnormal and inconclusive findings on diagnostic imaging of breast: Secondary | ICD-10-CM

## 2021-05-19 LAB — LIPID PANEL
Cholesterol: 158 mg/dL (ref <200)
HDL: 50 mg/dL (ref >=50)
LDL Cholesterol (Calc): 88 mg/dL (calc)
Non-HDL Cholesterol (Calc): 108 mg/dL (calc) (ref <130)
Total CHOL/HDL Ratio: 3.2 (calc) (ref <5.0)
Triglycerides: 104 mg/dL (ref <150)

## 2021-05-19 LAB — COMPLETE METABOLIC PANEL WITH GFR
AG Ratio: 1.7 (calc) (ref 1.0–2.5)
ALT: 16 U/L (ref 6–29)
AST: 19 U/L (ref 10–35)
Albumin: 4.5 g/dL (ref 3.6–5.1)
Alkaline phosphatase (APISO): 79 U/L (ref 37–153)
BUN: 19 mg/dL (ref 7–25)
CO2: 30 mmol/L (ref 20–32)
Calcium: 9.6 mg/dL (ref 8.6–10.4)
Chloride: 100 mmol/L (ref 98–110)
Creat: 0.81 mg/dL (ref 0.60–1.00)
Globulin: 2.7 g/dL (calc) (ref 1.9–3.7)
Glucose, Bld: 120 mg/dL (ref 65–139)
Potassium: 4 mmol/L (ref 3.5–5.3)
Sodium: 141 mmol/L (ref 135–146)
Total Bilirubin: 0.5 mg/dL (ref 0.2–1.2)
Total Protein: 7.2 g/dL (ref 6.1–8.1)
eGFR: 78 mL/min/{1.73_m2} (ref >=60)

## 2021-05-19 NOTE — Progress Notes (Signed)
Hi Natalie Wright, mammogram shows a questionable area in the left breast that they would like to evaluate further.  The imaging department will be contacting you soon to schedule.

## 2021-05-19 NOTE — Progress Notes (Signed)
Your lab work is within acceptable range and there are no concerning findings.   ?

## 2021-06-21 ENCOUNTER — Ambulatory Visit
Admission: RE | Admit: 2021-06-21 | Discharge: 2021-06-21 | Disposition: A | Discharge status: Home / Self Care | Payer: Medicare Other | Source: Ambulatory Visit | Attending: Family Medicine | Admitting: Family Medicine

## 2021-06-21 ENCOUNTER — Other Ambulatory Visit: Payer: Self-pay | Admitting: Family Medicine

## 2021-06-21 DIAGNOSIS — R922 Inconclusive mammogram: Secondary | ICD-10-CM | POA: Diagnosis not present

## 2021-06-21 DIAGNOSIS — R928 Other abnormal and inconclusive findings on diagnostic imaging of breast: Secondary | ICD-10-CM

## 2021-07-04 ENCOUNTER — Ambulatory Visit: Payer: Medicare Other | Admitting: Family Medicine

## 2021-07-13 DIAGNOSIS — Z6841 Body Mass Index (BMI) 40.0 and over, adult: Secondary | ICD-10-CM | POA: Diagnosis not present

## 2021-07-13 DIAGNOSIS — J3089 Other allergic rhinitis: Secondary | ICD-10-CM | POA: Diagnosis not present

## 2021-07-13 DIAGNOSIS — Z9989 Dependence on other enabling machines and devices: Secondary | ICD-10-CM | POA: Diagnosis not present

## 2021-07-13 DIAGNOSIS — G4733 Obstructive sleep apnea (adult) (pediatric): Secondary | ICD-10-CM | POA: Diagnosis not present

## 2021-07-18 ENCOUNTER — Other Ambulatory Visit: Payer: Self-pay

## 2021-07-18 ENCOUNTER — Ambulatory Visit (INDEPENDENT_AMBULATORY_CARE_PROVIDER_SITE_OTHER): Payer: Medicare Other | Admitting: Family Medicine

## 2021-07-18 ENCOUNTER — Encounter: Payer: Self-pay | Admitting: Family Medicine

## 2021-07-18 VITALS — BP 104/40 | HR 66 | Resp 18 | Ht 61.0 in | Wt 324.0 lb

## 2021-07-18 DIAGNOSIS — R42 Dizziness and giddiness: Secondary | ICD-10-CM | POA: Diagnosis not present

## 2021-07-18 DIAGNOSIS — L280 Lichen simplex chronicus: Secondary | ICD-10-CM

## 2021-07-18 DIAGNOSIS — Z780 Asymptomatic menopausal state: Secondary | ICD-10-CM | POA: Diagnosis not present

## 2021-07-18 DIAGNOSIS — Z872 Personal history of diseases of the skin and subcutaneous tissue: Secondary | ICD-10-CM

## 2021-07-18 DIAGNOSIS — K219 Gastro-esophageal reflux disease without esophagitis: Secondary | ICD-10-CM | POA: Diagnosis not present

## 2021-07-18 DIAGNOSIS — M2041 Other hammer toe(s) (acquired), right foot: Secondary | ICD-10-CM

## 2021-07-18 DIAGNOSIS — M2042 Other hammer toe(s) (acquired), left foot: Secondary | ICD-10-CM

## 2021-07-18 DIAGNOSIS — E1149 Type 2 diabetes mellitus with other diabetic neurological complication: Secondary | ICD-10-CM | POA: Diagnosis not present

## 2021-07-18 DIAGNOSIS — I10 Essential (primary) hypertension: Secondary | ICD-10-CM

## 2021-07-18 LAB — POCT GLYCOSYLATED HEMOGLOBIN (HGB A1C): Hemoglobin A1C: 6.8 % — AB (ref 4.0–5.6)

## 2021-07-18 MED ORDER — LISINOPRIL 5 MG PO TABS: 5.0000 mg | ORAL_TABLET | Freq: Every day | ORAL | 1 refills | Status: DC

## 2021-07-18 MED ORDER — CLOBETASOL PROPIONATE 0.05 % EX CREA: 1.0000 "application " | TOPICAL_CREAM | Freq: Two times a day (BID) | CUTANEOUS | 1 refills | Status: DC

## 2021-07-18 MED ORDER — PANTOPRAZOLE SODIUM 40 MG PO TBEC: 40.0000 mg | DELAYED_RELEASE_TABLET | Freq: Every day | ORAL | 3 refills | Status: DC

## 2021-07-18 MED ORDER — MECLIZINE HCL 25 MG PO TABS: 25.0000 mg | ORAL_TABLET | Freq: Three times a day (TID) | ORAL | 0 refills | Status: DC | PRN

## 2021-07-18 MED ORDER — CLOPIDOGREL BISULFATE 75 MG PO TABS: 75.0000 mg | ORAL_TABLET | Freq: Every day | ORAL | 3 refills | Status: DC

## 2021-07-18 NOTE — Assessment & Plan Note (Signed)
Refilled her topical steroid cream for as needed use.

## 2021-07-18 NOTE — Assessment & Plan Note (Signed)
Pressure still little bit low today even on the 10 mg of lisinopril.  Some to have her decrease down to 5 mg.  If her's pressure is still low when I see her back then we may need to actually adjust her chlorthalidone as well.  I do not keep her on some ACE inhibitor to her because of her diabetes.

## 2021-07-18 NOTE — Assessment & Plan Note (Addendum)
A1c up a little bit from previous at 6.8 today.  Currently on Jardiance and Trulicity and metformin.  She plans on making some changes and getting a little bit back on track after the holidays.  We could always consider increasing the Trulicity to 4.5 mg as well.  So far she has not had any issues with the insurance getting it covered.

## 2021-07-18 NOTE — Assessment & Plan Note (Signed)
Asked for refill on her PPI today.

## 2021-07-18 NOTE — Patient Instructions (Signed)
We can always consider going up on your Trulicity to 4.5 mg when you are due for your next refill.

## 2021-07-18 NOTE — Assessment & Plan Note (Signed)
She is really doing fantastic.  Weight is down again about an additional 3 pounds which is fantastic.  Just encouraged her to continue to set weekly goals and and try to work on increasing activity levels.

## 2021-07-18 NOTE — Progress Notes (Addendum)
Established Patient Office Visit  Subjective:  Patient ID: Natalie Wright, female    DOB: [birth year]  Age: 71 y.o. MRN: [id number]  CC:  Chief Complaint  Patient presents with   Diabetes    Follow up    Face pain    Patient states she has been having a sharp pain in her right nostril that radiates down face off and on since 12/22.    Discuss Medication    Lisinopril. Patient requesting for a prescription for 10 mg tablet.     HPI Natalie Wright presents for   Diabetes - no hypoglycemic events. No wounds or sores that are not healing well. No increased thirst or urination. Checking glucose at home. Taking medications as prescribed without any side effects.  Using 25 units of Lantus.  Patient states she has been having a sharp pain in her right nostril that radiates down face off and on since 12/22.  She says it felt like a very sharp intense pain that could last for seconds to maybe even a couple of minutes and then it would ease off.  When it first started it was happening 7-8 times per day but it is now decreased in intensity and its not happening daily its happening much less frequently.  She was not having any sinus pain or pressure.  No nasal congestion.  Nothing unusual.  No medication changes.  No facial injury.  She reports that her swelling has been fairly stable it is chronic but it has not been worse lately she is wearing her compression stockings.  She has been splitting her lisinopril and has been doing really well on that.  She is currently taking 10 mg total.  Past Medical History:  Diagnosis Date   Allergy    to cats and dogs   Diabetes mellitus    Hiatal hernia    and delayed gastric emptying/ sees [facility] for recurrent diarrhea   History of cardiovascular disorder 12/10/2008   Qualifier: Diagnosis of  By: Madilyn Fireman MD, Ariannah Arenson     Hypertension    OSA (obstructive sleep apnea)    CPAP 4mhg   Pedal edema    Spastic bladder    urology in [place]   TIA  (transient ischemic attack)    hx of- sees Dr SElwin Mocha(Neurology)    Past Surgical History:  Procedure Laterality Date   vaginal skin tag removal      Family History  Problem Relation Age of Onset   Diabetes Mother    Hypertension Mother    Glaucoma Mother    Cancer Mother        bone cancer   Alcohol abuse Father    Emphysema Father    Other Father        CHF   Ovarian cancer Sister     Social History   Socioeconomic History   Marital status: Married    Spouse name: VClifton James  Number of children: 2   Years of education: Masters   Highest education level: Master's degree (e.g., MA, MS, MEng, MEd, MSW, MBA)  Occupational History   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  Retired and works 1 day a week for her daughter just to be out and about. Enjoys reading and doing crafts in her free time.   Social Determinants of Health   Financial Resource Strain: Low Risk    Difficulty of Paying Living Expenses: Not hard at all  Food Insecurity: No Food Insecurity   Worried About Charity fundraiser in the Last Year: Never true   [place] in the Last Year: Never true  Transportation Needs: No Transportation Needs   Lack of Transportation (Medical): No   Lack of Transportation (Non-Medical): No  Physical Activity: Inactive   Days of Exercise per Week: 0 days   Minutes of Exercise per Session: 0 min  Stress: No Stress Concern Present   Feeling of Stress : Not at all  Social Connections: Moderately Integrated   Frequency of Communication with Friends and Family: More than three times a week   Frequency of Social Gatherings with  Friends and Family: Twice a week   Attends Religious Services: Never   Marine scientist or Organizations: Yes   Attends Music therapist: More than 4 times per year   Marital Status: Married  Human resources officer Violence: Not At Risk   Fear of Current or Ex-Partner: No   Emotionally Abused: No   Physically Abused: No   Sexually Abused: No    Outpatient Medications Prior to Visit  Medication Sig Dispense Refill   acetaminophen (TYLENOL) 650 MG CR tablet Take 650 mg by mouth every 8 (eight) hours as needed.     AMBULATORY NON FORMULARY MEDICATION Medication Name: custom fit knee high compression stocking with 15-20 mmHg pressure. Open toe preferred. 1 vial 0   aspirin 325 MG tablet Take 325 mg by mouth daily.     cetirizine (ZYRTEC) 10 MG tablet Take 10 mg by mouth daily.     chlorthalidone (HYGROTON) 25 MG tablet TAKE 1 TABLET DAILY 90 tablet 3   diltiazem (CARDIZEM CD) 360 MG 24 hr capsule TAKE 1 CAPSULE(360 MG) BY MOUTH DAILY 90 capsule 0   Ferrous Sulfate Dried 200 (65 Fe) MG TABS Take 1 tablet by mouth daily.     ibuprofen (ADVIL) 600 MG tablet      Insulin Pen Needle (NOVOFINE) 30G X 8 MM MISC For use each time injection for insulin 100 each 4   JARDIANCE 25 MG TABS tablet TAKE 1 TABLET DAILY 90 tablet 3   latanoprost (XALATAN) 0.005 % ophthalmic solution 1 drop.     metFORMIN (GLUCOPHAGE) 1000 MG tablet TAKE 1 TABLET TWICE A DAY WITH MEALS 180 tablet 3   Misc. Devices MISC Transfer of care to [facility].  Please provide service to her current CPAP machine for OSA at 14 cm. water pressure.  Send directly to [place] at [facility].     Omega-3 Fatty Acids (FISH OIL) 1200 MG CAPS Take 1 capsule by mouth daily.     rosuvastatin (CRESTOR) 10 MG tablet Take 1 tablet (10 mg total) by mouth once a week. At bedtime. 12 tablet 3   sucralfate (CARAFATE) 1 GM/10ML suspension Take 10 mLs (1 g total) by mouth 4 (four) times daily -  with meals and at bedtime. 420 mL 0   clobetasol cream  (TEMOVATE) 0.16 % Apply 1 application topically 2 (two) times daily. 45 g 1   clopidogrel (PLAVIX) 75 MG tablet TAKE 1 TABLET DAILY 90 tablet  3   Dulaglutide (TRULICITY) 3 WU/9.8JX SOPN Inject 3 mg as directed once a week. 12 mL 1   fluconazole (DIFLUCAN) 150 MG tablet TAKE 1 TABLET BY MOUTH ONCE She is taking it PRN 7 tablet 3   lactase (LACTAID) 3000 UNITS tablet Take 3,000 Units by mouth as needed.      lisinopril (ZESTRIL) 10 MG tablet Take 10 mg by mouth daily.     meclizine (ANTIVERT) 25 MG tablet Take 1 tablet (25 mg total) by mouth 3 (three) times daily as needed for dizziness. 90 tablet 1   pantoprazole (PROTONIX) 40 MG tablet TAKE 1 TABLET DAILY 90 tablet 3   LANTUS 100 UNIT/ML injection INJECT 35 UNITS UNDER THE SKIN DAILY (Patient taking differently: Patient currently taking 25 units daily) 40 mL 3   amoxicillin (AMOXIL) 500 MG capsule      HYDROcodone-acetaminophen (NORCO) 7.5-325 MG tablet      lisinopril (ZESTRIL) 20 MG tablet Take 1 tablet (20 mg total) by mouth daily. 90 tablet 3   No facility-administered medications prior to visit.    Allergies  Allergen Reactions   Atorvastatin Other (See Comments)    Patient stated muscle weakness Patient stated muscle weakness   Beclomethasone Other (See Comments)    unknown unknown   Cefaclor Hives   Pioglitazone Other (See Comments)    Mouth sores Mouth sores Mouth sores unknown REACTION: ulcers in the mouth   Pravastatin Other (See Comments)    Chest pain.  Chest pain.    Sulfa Antibiotics Hives    Other reaction(s): Agitation   Cephalexin    Cephalexin Other (See Comments)    unknown   Lipitor [Atorvastatin Calcium] Other (See Comments)    myalgias   Statins Other (See Comments)    Muscle pain Other reaction(s): Other (See Comments) Muscle pain    ROS Review of Systems    Objective:    Physical Exam Constitutional:      Appearance: Normal appearance. She is well-developed.  HENT:     Head:  Normocephalic and atraumatic.  Cardiovascular:     Rate and Rhythm: Normal rate and regular rhythm.     Heart sounds: Normal heart sounds.  Pulmonary:     Effort: Pulmonary effort is normal.     Breath sounds: Normal breath sounds.  Skin:    General: Skin is warm and dry.  Neurological:     Mental Status: She is alert and oriented to person, place, and time.  Psychiatric:        Behavior: Behavior normal.    BP (!) 104/40    Pulse 66    Resp 18    Ht 5' 1"  (1.549 m)    Wt (!) 324 lb (147 kg)    SpO2 97%    BMI 61.22 kg/m  Wt Readings from Last 3 Encounters:  07/18/21 (!) 324 lb (147 kg)  03/28/21 (!) 327 lb (148.3 kg)  12/09/20 (!) 338 lb (153.3 kg)     Health Maintenance Due  Topic Date Due   DEXA SCAN  06/26/2021    There are no preventive care reminders to display for this patient.  Lab Results  Component Value Date   TSH 2.28 09/22/2019   Lab Results  Component Value Date   WBC 9.4 09/23/2020   HGB 14.6 09/23/2020   HCT 43.9 09/23/2020   MCV 86.6 09/23/2020   PLT 332 09/23/2020   Lab Results  Component Value Date   NA 141 05/18/2021  K 4.0 05/18/2021   CO2 30 05/18/2021   GLUCOSE 120 05/18/2021   BUN 19 05/18/2021   CREATININE 0.81 05/18/2021   BILITOT 0.5 05/18/2021   ALKPHOS 71 06/08/2016   AST 19 05/18/2021   ALT 16 05/18/2021   PROT 7.2 05/18/2021   ALBUMIN 4.3 06/08/2016   CALCIUM 9.6 05/18/2021   ANIONGAP 7 05/09/2015   EGFR 78 05/18/2021   Lab Results  Component Value Date   CHOL 158 05/18/2021   Lab Results  Component Value Date   HDL 50 05/18/2021   Lab Results  Component Value Date   LDLCALC 88 05/18/2021   Lab Results  Component Value Date   TRIG 104 05/18/2021   Lab Results  Component Value Date   CHOLHDL 3.2 05/18/2021   Lab Results  Component Value Date   HGBA1C 6.8 (A) 07/18/2021      Assessment & Plan:   Problem List Items Addressed This Visit       Cardiovascular and Mediastinum   Essential  hypertension, benign    Pressure still little bit low today even on the 10 mg of lisinopril.  Some to have her decrease down to 5 mg.  If her's pressure is still low when I see her back then we may need to actually adjust her chlorthalidone as well.  I do not keep her on some ACE inhibitor to her because of her diabetes.      Relevant Medications   lisinopril (ZESTRIL) 5 MG tablet   clopidogrel (PLAVIX) 75 MG tablet     Digestive   GERD    Asked for refill on her PPI today.      Relevant Medications   pantoprazole (PROTONIX) 40 MG tablet   meclizine (ANTIVERT) 25 MG tablet     Endocrine   Type 2 diabetes mellitus with neurological complications (HCC) - Primary    A1c up a little bit from previous at 6.8 today.  Currently on Jardiance and Trulicity and metformin.  She plans on making some changes and getting a little bit back on track after the holidays.  We could always consider increasing the Trulicity to 4.5 mg as well.  So far she has not had any issues with the insurance getting it covered.      Relevant Medications   lisinopril (ZESTRIL) 5 MG tablet   Other Relevant Orders   POCT HgB A1C (Completed)     Musculoskeletal and Integument   Lichenoid dermatitis    Refilled her topical steroid cream for as needed use.      Relevant Medications   clobetasol cream (TEMOVATE) 0.05 %   Hammertoe, bilateral     Other   Morbid obesity ([place])    She is really doing fantastic.  Weight is down again about an additional 3 pounds which is fantastic.  Just encouraged her to continue to set weekly goals and and try to work on increasing activity levels.      Relevant Medications   clopidogrel (PLAVIX) 75 MG tablet   History of foot ulcer    History of hammertoes and foot ulcer I do feel like she would benefit from therapeutic footwear.  She has an upcoming appointment with [facility].      Other Visit Diagnoses     Dizziness       Relevant Medications   meclizine  (ANTIVERT) 25 MG tablet   Post-menopausal       Relevant Orders   DG Bone Density  In regards to the right sided facial pain just between the right nostril and the upper lip, unclear etiology it sounds like it certainly could be the nerve that was irritated or triggered.  It does not Solik she was having any type of dental issue or sinusitis at the time.  It does seem to be resolving on its own so just encouraged her to keep an eye on it and if at any point it starts to react back up we may consider ENT referral.  Due for bone density test we will get that ordered today.  Meds ordered this encounter  Medications   lisinopril (ZESTRIL) 5 MG tablet    Sig: Take 1 tablet (5 mg total) by mouth daily.    Dispense:  90 tablet    Refill:  1   clopidogrel (PLAVIX) 75 MG tablet    Sig: Take 1 tablet (75 mg total) by mouth daily.    Dispense:  90 tablet    Refill:  3   clobetasol cream (TEMOVATE) 0.05 %    Sig: Apply 1 application topically 2 (two) times daily.    Dispense:  45 g    Refill:  1   pantoprazole (PROTONIX) 40 MG tablet    Sig: Take 1 tablet (40 mg total) by mouth daily.    Dispense:  90 tablet    Refill:  3   meclizine (ANTIVERT) 25 MG tablet    Sig: Take 1 tablet (25 mg total) by mouth 3 (three) times daily as needed for dizziness.    Dispense:  60 tablet    Refill:  0    Follow-up: Return in about 3 months (around 10/16/2021) for Diabetes follow-up.    Beatrice Lecher, MD

## 2021-08-01 ENCOUNTER — Ambulatory Visit (INDEPENDENT_AMBULATORY_CARE_PROVIDER_SITE_OTHER): Payer: Medicare Other | Admitting: Podiatry

## 2021-08-01 ENCOUNTER — Other Ambulatory Visit: Payer: Self-pay

## 2021-08-01 DIAGNOSIS — Z7901 Long term (current) use of anticoagulants: Secondary | ICD-10-CM

## 2021-08-01 DIAGNOSIS — L84 Corns and callosities: Secondary | ICD-10-CM

## 2021-08-01 DIAGNOSIS — B351 Tinea unguium: Secondary | ICD-10-CM

## 2021-08-01 DIAGNOSIS — M216X2 Other acquired deformities of left foot: Secondary | ICD-10-CM

## 2021-08-01 DIAGNOSIS — M79675 Pain in left toe(s): Secondary | ICD-10-CM | POA: Diagnosis not present

## 2021-08-01 DIAGNOSIS — M79674 Pain in right toe(s): Secondary | ICD-10-CM | POA: Diagnosis not present

## 2021-08-01 DIAGNOSIS — M216X1 Other acquired deformities of right foot: Secondary | ICD-10-CM

## 2021-08-06 ENCOUNTER — Encounter: Payer: Self-pay | Admitting: Family Medicine

## 2021-08-06 DIAGNOSIS — E1149 Type 2 diabetes mellitus with other diabetic neurological complication: Secondary | ICD-10-CM

## 2021-08-07 MED ORDER — TRULICITY 4.5 MG/0.5ML ~~LOC~~ SOAJ: 4.5000 mg | SUBCUTANEOUS | 1 refills | Status: DC

## 2021-08-07 NOTE — Progress Notes (Signed)
Subjective: 71 y.o. returns the office today for painful, elongated, thickened toenails which they cannot trim herself.  She denies any open sores.  She started a B complex vitamin and the burning has decreased.  She is asking about new inserts to help control her pronation.  She has no new concerns today.  PCP: Hali Marry, MD Last seen family 17 2023 A1c: 6.8 on July 18, 2021  Objective: AAO 3, NAD DP/PT pulses palpable, CRT less than 3 seconds Protective sensation decreased with Simms Weinstein monofilament Nails hypertrophic, dystrophic, elongated, brittle, discolored 10. There is tenderness overlying the nails 1-5 bilaterally. There is no surrounding erythema or drainage along the nail sites. Hyperkeratotic lesion right foot submetatarsal 5, metatarsal base as well as the heel.  Decrease in arch upon weightbearing.  No underlying ulceration drainage or any signs of infection noted today.  No open lesions or other pre-ulcerative lesions are identified. No pain with calf compression, swelling, warmth, erythema.  Assessment: Patient presents with symptomatic onychomycosis, hyperkeratotic lesions  Plan: -Treatment options including alternatives, risks, complications were discussed -Nails sharply debrided 10 without complication/bleeding. -Hyperkeratotic lesion sharply debrided x 2 without any complications or bleeding.  Recommend moisturizer daily.  -Follow-up with our orthotist, Aaron Edelman for inserts or diabetic shoes. -Discussed daily foot inspection. If there are any changes, to call the office immediately.  -Follow-up in 3 months or sooner if any problems are to arise. In the meantime, encouraged to call the office with any questions, concerns, changes symptoms.  Celesta Gentile, DPM

## 2021-08-07 NOTE — Telephone Encounter (Signed)
Meds ordered this encounter  Medications   Dulaglutide (TRULICITY) 4.5 YS/0.6TK SOPN    Sig: Inject 4.5 mg as directed once a week.    Dispense:  6 mL    Refill:  1

## 2021-08-10 ENCOUNTER — Other Ambulatory Visit: Payer: Self-pay

## 2021-08-10 ENCOUNTER — Ambulatory Visit: Payer: Medicare Other

## 2021-08-10 DIAGNOSIS — Z872 Personal history of diseases of the skin and subcutaneous tissue: Secondary | ICD-10-CM

## 2021-08-10 DIAGNOSIS — M2041 Other hammer toe(s) (acquired), right foot: Secondary | ICD-10-CM

## 2021-08-10 DIAGNOSIS — E1149 Type 2 diabetes mellitus with other diabetic neurological complication: Secondary | ICD-10-CM

## 2021-08-10 NOTE — Progress Notes (Signed)
SITUATION Reason for Consult: Evaluation for Bilateral Custom Diabetic Inserts. Patient / Caregiver Report: Patient does not like closed shoes and historically used diabetic insoles in open back slide ons  OBJECTIVE DATA: Patient History / Diagnosis:    ICD-10-CM   1. Type 2 diabetes mellitus with neurological complications (HCC)  G38.75     2. Hammertoes of both feet  M20.41    M20.42     3. History of foot ulcer  Z87.2       Current or Previous Devices:   Patient uses diabetic insoles in slip on shoes and insists on this method  In-Person Foot Examination: Ulcers & Callousing:   Historical  Toe / Foot Deformities:   - Pes Planus - Hammertoes   Shoe Size: 11W  ORTHOTIC RECOMMENDATION Recommended Devices:  - 3x pair custom-to-patient vacuum formed diabetic insoles.   GOALS OF INSOLES - Reduce shear and pressure - Reduce / Prevent callus formation - Reduce / Prevent ulceration - Protect the fragile healing compromised diabetic foot.  Patient would benefit from diabetic inserts as patient has diabetes mellitus and the patient has one or more of the following conditions: - History of partial or complete amputation of the foot - History of previous foot ulceration. - History of pre-ulcerative callus - Peripheral neuropathy with evidence of callus formation - Foot deformity - Poor circulation  ACTIONS PERFORMED Patient was casted for insoles via crush box. Procedure was explained and patient tolerated procedure well. All questions were answered and concerns addressed.  PLAN Patient is to ensure treating physician receives and completes diabetic paperwork. Casts are to be held until paperwork is received. Once received patient is to be scheduled for fitting in four weeks.

## 2021-08-21 ENCOUNTER — Telehealth: Payer: Self-pay

## 2021-08-21 NOTE — Telephone Encounter (Signed)
Casts Sent to [facility] [employer] - HOLD for CMN °

## 2021-08-23 DIAGNOSIS — Z872 Personal history of diseases of the skin and subcutaneous tissue: Secondary | ICD-10-CM | POA: Insufficient documentation

## 2021-08-23 DIAGNOSIS — M2042 Other hammer toe(s) (acquired), left foot: Secondary | ICD-10-CM | POA: Insufficient documentation

## 2021-08-23 DIAGNOSIS — M2041 Other hammer toe(s) (acquired), right foot: Secondary | ICD-10-CM | POA: Insufficient documentation

## 2021-08-23 NOTE — Assessment & Plan Note (Signed)
History of hammertoes and foot ulcer I do feel like she would benefit from therapeutic footwear.  She has an upcoming appointment with [facility].

## 2021-08-28 DIAGNOSIS — H40003 Preglaucoma, unspecified, bilateral: Secondary | ICD-10-CM | POA: Diagnosis not present

## 2021-08-28 DIAGNOSIS — H43813 Vitreous degeneration, bilateral: Secondary | ICD-10-CM | POA: Diagnosis not present

## 2021-08-28 DIAGNOSIS — H25813 Combined forms of age-related cataract, bilateral: Secondary | ICD-10-CM | POA: Diagnosis not present

## 2021-08-28 DIAGNOSIS — H527 Unspecified disorder of refraction: Secondary | ICD-10-CM | POA: Diagnosis not present

## 2021-08-28 DIAGNOSIS — E119 Type 2 diabetes mellitus without complications: Secondary | ICD-10-CM | POA: Diagnosis not present

## 2021-08-29 ENCOUNTER — Other Ambulatory Visit: Payer: Self-pay | Admitting: Family Medicine

## 2021-08-29 DIAGNOSIS — R42 Dizziness and giddiness: Secondary | ICD-10-CM

## 2021-08-30 HISTORY — PX: CATARACT EXTRACTION: SUR2

## 2021-09-18 DIAGNOSIS — H25813 Combined forms of age-related cataract, bilateral: Secondary | ICD-10-CM | POA: Diagnosis not present

## 2021-09-21 DIAGNOSIS — I471 Supraventricular tachycardia: Secondary | ICD-10-CM | POA: Diagnosis not present

## 2021-09-21 DIAGNOSIS — Z79899 Other long term (current) drug therapy: Secondary | ICD-10-CM | POA: Diagnosis not present

## 2021-09-21 DIAGNOSIS — Z20822 Contact with and (suspected) exposure to covid-19: Secondary | ICD-10-CM | POA: Diagnosis not present

## 2021-09-21 DIAGNOSIS — I10 Essential (primary) hypertension: Secondary | ICD-10-CM | POA: Diagnosis not present

## 2021-09-21 DIAGNOSIS — Z794 Long term (current) use of insulin: Secondary | ICD-10-CM | POA: Diagnosis not present

## 2021-09-21 DIAGNOSIS — Z7902 Long term (current) use of antithrombotics/antiplatelets: Secondary | ICD-10-CM | POA: Diagnosis not present

## 2021-09-21 DIAGNOSIS — K219 Gastro-esophageal reflux disease without esophagitis: Secondary | ICD-10-CM | POA: Diagnosis not present

## 2021-09-21 DIAGNOSIS — H25811 Combined forms of age-related cataract, right eye: Secondary | ICD-10-CM | POA: Diagnosis not present

## 2021-09-21 DIAGNOSIS — Z7985 Long-term (current) use of injectable non-insulin antidiabetic drugs: Secondary | ICD-10-CM | POA: Diagnosis not present

## 2021-09-21 DIAGNOSIS — E1136 Type 2 diabetes mellitus with diabetic cataract: Secondary | ICD-10-CM | POA: Diagnosis not present

## 2021-09-21 DIAGNOSIS — H25813 Combined forms of age-related cataract, bilateral: Secondary | ICD-10-CM | POA: Diagnosis not present

## 2021-09-21 DIAGNOSIS — G4730 Sleep apnea, unspecified: Secondary | ICD-10-CM | POA: Diagnosis not present

## 2021-09-21 DIAGNOSIS — I493 Ventricular premature depolarization: Secondary | ICD-10-CM | POA: Diagnosis not present

## 2021-09-21 DIAGNOSIS — Z7984 Long term (current) use of oral hypoglycemic drugs: Secondary | ICD-10-CM | POA: Diagnosis not present

## 2021-10-10 ENCOUNTER — Other Ambulatory Visit: Payer: Self-pay | Admitting: Family Medicine

## 2021-10-16 ENCOUNTER — Ambulatory Visit: Payer: Medicare Other | Admitting: Family Medicine

## 2021-10-19 LAB — HM DIABETES EYE EXAM

## 2021-10-20 DIAGNOSIS — Z20822 Contact with and (suspected) exposure to covid-19: Secondary | ICD-10-CM | POA: Diagnosis not present

## 2021-10-31 ENCOUNTER — Ambulatory Visit (INDEPENDENT_AMBULATORY_CARE_PROVIDER_SITE_OTHER): Payer: Medicare Other | Admitting: Podiatry

## 2021-10-31 ENCOUNTER — Ambulatory Visit (INDEPENDENT_AMBULATORY_CARE_PROVIDER_SITE_OTHER): Payer: Medicare Other

## 2021-10-31 DIAGNOSIS — M2042 Other hammer toe(s) (acquired), left foot: Secondary | ICD-10-CM

## 2021-10-31 DIAGNOSIS — Z7901 Long term (current) use of anticoagulants: Secondary | ICD-10-CM

## 2021-10-31 DIAGNOSIS — M2041 Other hammer toe(s) (acquired), right foot: Secondary | ICD-10-CM

## 2021-10-31 DIAGNOSIS — E1149 Type 2 diabetes mellitus with other diabetic neurological complication: Secondary | ICD-10-CM | POA: Diagnosis not present

## 2021-10-31 DIAGNOSIS — M79674 Pain in right toe(s): Secondary | ICD-10-CM | POA: Diagnosis not present

## 2021-10-31 DIAGNOSIS — B351 Tinea unguium: Secondary | ICD-10-CM | POA: Diagnosis not present

## 2021-10-31 DIAGNOSIS — M79675 Pain in left toe(s): Secondary | ICD-10-CM

## 2021-10-31 DIAGNOSIS — L84 Corns and callosities: Secondary | ICD-10-CM | POA: Diagnosis not present

## 2021-10-31 DIAGNOSIS — Z872 Personal history of diseases of the skin and subcutaneous tissue: Secondary | ICD-10-CM

## 2021-10-31 NOTE — Progress Notes (Signed)
SITUATION ?Reason for Visit: Fitting of Diabetic Insoles ?Patient / Caregiver Report:  Patient is satisfied with fit and function of insoles. ? ?OBJECTIVE DATA: ?Patient History / Diagnosis:   ?  ICD-10-CM   ?1. Type 2 diabetes mellitus with neurological complications (HCC)  Q03.47   ?  ?2. Hammertoes of both feet  M20.41   ? M20.42   ?  ?3. History of foot ulcer  Z87.2   ?  ? ? ?Change in Status:   None ? ?ACTIONS PERFORMED: ?In-Person Delivery, patient was fit with: ?- 3x pair [phone] PDAC approved vacuum formed custom diabetic insoles; [employer]: [id number] ? ?Insoles were verified for structural integrity and safety. Patient tried insoles in office, but did not have proper shoes with her for fitting and walk-testing. Inserts fit properly. Patient / Caregiver provided with ferbal instruction and demonstration regarding donning, doffing, wear, care, proper fit, function, purpose, cleaning, and use of shoes and insoles ' and in all related precautions and risks and benefits regarding shoes and insoles. Patient / Caregiver was instructed to wear properly fitting socks with shoes at all times. Patient was also provided with verbal instruction regarding how to report any failures or malfunctions of shoes or inserts, and necessary follow up care. Patient / Caregiver was also instructed to contact physician regarding change in status that may affect function of shoes and inserts.  ? ?Patient / Caregiver verbalized undersatnding of instruction provided. Patient / Caregiver demonstrated independence with proper donning and doffing of shoes and inserts. ? ?Patient is to contact us if she cannot make the insoles fit her existing shoes. ? ?PLAN ?Patient to follow with treating physician as recommended. Plan of care was discussed with and agreed upon by patient and/or caregiver. All questions were answered and concerns addressed. ? ?

## 2021-10-31 NOTE — Progress Notes (Signed)
Subjective: ?71 y.o. returns the office today for painful, elongated, thickened toenails which they cannot trim herself.  She denies any open sores.  She also has calluses to her feet.  She states that the inserts that she has not yet picked them up.  ? ?PCP: Hali Marry, MD ?Last seen family 17 2023 ?A1c: 6.8 on July 18, 2021 ? ?Objective: ?AAO ?3, NAD ?DP/PT pulses palpable, CRT less than 3 seconds ?Protective sensation decreased with Derrel Nip monofilament ?Nails hypertrophic, dystrophic, elongated, brittle, discolored ?10. There is tenderness overlying the nails 1-5 bilaterally. There is no surrounding erythema or drainage along the nail sites. ?Hyperkeratotic lesion right foot submetatarsal 5, metatarsal base as well as the heel.  No underlying ulceration drainage or any signs of infection noted today. ?Decrease in arch upon weightbearing.  ?No open lesions or other pre-ulcerative lesions are identified. ?No pain with calf compression, swelling, warmth, erythema. ? ?Assessment: ?Patient presents with symptomatic onychomycosis, hyperkeratotic lesions ? ?Plan: ?-Treatment options including alternatives, risks, complications were discussed ?-Nails sharply debrided ?10 without complication/bleeding. ?-Hyperkeratotic lesion sharply debrided x 4 without any complications or bleeding.  Recommend moisturizer daily.  ?-Inserts were dispensed by Aaron Edelman, orthotist ?-Discussed daily foot inspection. If there are any changes, to call the office immediately.  ?-Follow-up in 3 months or sooner if any problems are to arise. In the meantime, encouraged to call the office with any questions, concerns, changes symptoms. ? ?Celesta Gentile, DPM ? ?

## 2021-11-04 DIAGNOSIS — Z20822 Contact with and (suspected) exposure to covid-19: Secondary | ICD-10-CM | POA: Diagnosis not present

## 2021-11-06 ENCOUNTER — Other Ambulatory Visit: Payer: Self-pay | Admitting: Family Medicine

## 2021-11-06 DIAGNOSIS — Z20822 Contact with and (suspected) exposure to covid-19: Secondary | ICD-10-CM | POA: Diagnosis not present

## 2021-11-23 ENCOUNTER — Encounter: Payer: Self-pay | Admitting: Family Medicine

## 2021-11-23 ENCOUNTER — Ambulatory Visit (INDEPENDENT_AMBULATORY_CARE_PROVIDER_SITE_OTHER): Payer: Medicare Other | Admitting: Family Medicine

## 2021-11-23 VITALS — BP 131/51 | HR 52 | Ht 61.0 in | Wt 327.0 lb

## 2021-11-23 DIAGNOSIS — R42 Dizziness and giddiness: Secondary | ICD-10-CM

## 2021-11-23 DIAGNOSIS — I10 Essential (primary) hypertension: Secondary | ICD-10-CM | POA: Diagnosis not present

## 2021-11-23 DIAGNOSIS — E1149 Type 2 diabetes mellitus with other diabetic neurological complication: Secondary | ICD-10-CM | POA: Diagnosis not present

## 2021-11-23 LAB — POCT GLYCOSYLATED HEMOGLOBIN (HGB A1C): Hemoglobin A1C: 6.5 % — AB (ref 4.0–5.6)

## 2021-11-23 NOTE — Progress Notes (Signed)
Established Patient Office Visit  Subjective   Patient ID: Natalie Wright, female    DOB: 25-Jan-1951  Age: 71 y.o. MRN: [id number]  Chief Complaint  Patient presents with   Diabetes    HPI Diabetes - no hypoglycemic events. No wounds or sores that are not healing well. No increased thirst or urination. Checking glucose at home. Taking medications as prescribed without any side effects.  Hypertension- Pt denies chest pain, SOB, dizziness, or heart palpitations.  Taking meds as directed w/o problems.  Denies medication side effects.    Report worsening vertigo symptoms. Wonders if a medication side effect. Has noticed it more since decreasing her BP medications. Has been using her meclizine.  Feels it daily but is one and off.   No CP or SOB.    Had her cataract surgeries at the end of March.  Had some complication so just came off steroid eye drops last week.      ROS    Objective:     BP (!) 131/51   Pulse (!) 52   Ht '5\' 1"'$  (1.549 m)   Wt (!) 327 lb (148.3 kg)   SpO2 97%   BMI 61.79 kg/m    Physical Exam Vitals and nursing note reviewed.  Constitutional:      Appearance: She is well-developed.  HENT:     Head: Normocephalic and atraumatic.  Cardiovascular:     Rate and Rhythm: Normal rate and regular rhythm.     Heart sounds: Normal heart sounds.  Pulmonary:     Effort: Pulmonary effort is normal.     Breath sounds: Normal breath sounds.  Skin:    General: Skin is warm and dry.  Neurological:     Mental Status: She is alert and oriented to person, place, and time.  Psychiatric:        Behavior: Behavior normal.     Results for orders placed or performed in visit on 11/23/21  POCT glycosylated hemoglobin (Hb A1C)  Result Value Ref Range   Hemoglobin A1C 6.5 (A) 4.0 - 5.6 %   HbA1c POC (<> result, manual entry)     HbA1c, POC (prediabetic range)     HbA1c, POC (controlled diabetic range)        The 10-year ASCVD risk score (Arnett DK, et al.,  2019) is: 22.4%    Assessment & Plan:   Problem List Items Addressed This Visit       Cardiovascular and Mediastinum   Essential hypertension, benign    Blood pressure actually looks good today.  But she is going to follow it at home especially since she still having some episodes of feeling vertigo on and off.  I want to make sure its not related to her blood pressure.       Relevant Orders   BASIC METABOLIC PANEL WITH GFR   Microalbumin, urine     Endocrine   Type 2 diabetes mellitus with neurological complications (HCC) - Primary    A1c looks phenomenal today at 6.5.  Down from previous of 6.8 which is great.  She feels like the Trulicity is not working as well as it was initially so she is open to changes.  Gave her the name of Ozempic and Mounjaro to check with insurance to see if they are covered and we will discuss it again at her next follow-up.       Relevant Orders   POCT glycosylated hemoglobin (Hb A1C) (Completed)   BASIC METABOLIC PANEL WITH  GFR   Microalbumin, urine   Other Visit Diagnoses     Vertigo           Vertigo-okay to still use the meclizine as needed.  We discussed checking the blood pressure at home when she feels the symptoms to see if it is high or low.  We had recently actually decreased her blood pressure medication because her systolics were usually under 105 and I was worried that that was actually making her feel more weak and tired.  Consider vestibular rehab if it does not seem to be blood pressure triggered.  Did encourage her to get the new COVID by Valent vaccine at her local pharmacy.  Return in about 3 months (around 02/23/2022) for Diabetes follow-up.    Beatrice Lecher, MD

## 2021-11-23 NOTE — Patient Instructions (Signed)
Check with your insurance to see if they will cover Ozempic or Mounjaro.  Return for labs here when you get a chance. You don't have to fast.

## 2021-11-23 NOTE — Assessment & Plan Note (Signed)
Blood pressure actually looks good today.  But she is going to follow it at home especially since she still having some episodes of feeling vertigo on and off.  I want to make sure its not related to her blood pressure.

## 2021-11-23 NOTE — Assessment & Plan Note (Signed)
A1c looks phenomenal today at 6.5.  Down from previous of 6.8 which is great.  She feels like the Trulicity is not working as well as it was initially so she is open to changes.  Gave her the name of Ozempic and Mounjaro to check with insurance to see if they are covered and we will discuss it again at her next follow-up.

## 2021-12-18 DIAGNOSIS — E1149 Type 2 diabetes mellitus with other diabetic neurological complication: Secondary | ICD-10-CM | POA: Diagnosis not present

## 2021-12-18 DIAGNOSIS — I10 Essential (primary) hypertension: Secondary | ICD-10-CM | POA: Diagnosis not present

## 2021-12-19 LAB — BASIC METABOLIC PANEL WITH GFR
BUN: 11 mg/dL (ref 7–25)
CO2: 32 mmol/L (ref 20–32)
Calcium: 9.5 mg/dL (ref 8.6–10.4)
Chloride: 101 mmol/L (ref 98–110)
Creat: 0.73 mg/dL (ref 0.60–1.00)
Glucose, Bld: 136 mg/dL — ABNORMAL HIGH (ref 65–99)
Potassium: 4.2 mmol/L (ref 3.5–5.3)
Sodium: 142 mmol/L (ref 135–146)
eGFR: 88 mL/min/{1.73_m2} (ref >=60)

## 2021-12-19 LAB — MICROALBUMIN, URINE: Microalb, Ur: 2.2 mg/dL

## 2021-12-19 NOTE — Progress Notes (Signed)
Normal microalbumin

## 2021-12-21 ENCOUNTER — Ambulatory Visit
Admission: RE | Admit: 2021-12-21 | Discharge: 2021-12-21 | Disposition: A | Discharge status: Home / Self Care | Payer: Medicare Other | Source: Ambulatory Visit | Attending: Family Medicine | Admitting: Family Medicine

## 2021-12-21 ENCOUNTER — Other Ambulatory Visit: Payer: Self-pay | Admitting: Family Medicine

## 2021-12-21 DIAGNOSIS — R928 Other abnormal and inconclusive findings on diagnostic imaging of breast: Secondary | ICD-10-CM | POA: Diagnosis not present

## 2021-12-21 DIAGNOSIS — N6320 Unspecified lump in the left breast, unspecified quadrant: Secondary | ICD-10-CM

## 2021-12-21 DIAGNOSIS — N6323 Unspecified lump in the left breast, lower outer quadrant: Secondary | ICD-10-CM | POA: Diagnosis not present

## 2021-12-28 ENCOUNTER — Encounter: Payer: Self-pay | Admitting: Emergency Medicine

## 2021-12-28 ENCOUNTER — Emergency Department (INDEPENDENT_AMBULATORY_CARE_PROVIDER_SITE_OTHER)
Admission: EM | Admit: 2021-12-28 | Discharge: 2021-12-28 | Disposition: A | Discharge status: Home / Self Care | Payer: Medicare Other | Source: Home / Self Care | Attending: Family Medicine | Admitting: Family Medicine

## 2021-12-28 DIAGNOSIS — A6920 Lyme disease, unspecified: Secondary | ICD-10-CM | POA: Diagnosis not present

## 2021-12-28 MED ORDER — DOXYCYCLINE HYCLATE 100 MG PO CAPS: 100.0000 mg | ORAL_CAPSULE | Freq: Two times a day (BID) | ORAL | 0 refills | Status: DC

## 2021-12-28 NOTE — ED Triage Notes (Signed)
Patient states that she got bit by "something" x 5 days ago.  The area is located on her upper back that goes down to her right arm.  The area is very itchy and hydrocortisone cream is not working.

## 2021-12-28 NOTE — ED Provider Notes (Signed)
[facility]    CSN: [id number] Arrival date & time: 12/28/21  1034      History   Chief Complaint Chief Complaint  Patient presents with   Insect Bite    HPI Natalie Wright is a 71 y.o. female.   HPI  Patient does not recall an insect bite.  She has an itchy area that is getting worse on her right upper back and shoulder region.  She would like this evaluated She does not have any body aches fever or arthralgias  Past Medical History:  Diagnosis Date   Allergy    to cats and dogs   Diabetes mellitus    Hiatal hernia    and delayed gastric emptying/ sees [facility] for recurrent diarrhea   History of cardiovascular disorder 12/10/2008   Qualifier: Diagnosis of  By: Madilyn Fireman MD, Catherine     Hypertension    OSA (obstructive sleep apnea)    CPAP 76mhg   Pedal edema    Spastic bladder    [facility]  TIA (transient ischemic attack)    hx of- sees Dr SElwin Mocha(Neurology)    Patient Active Problem List   Diagnosis Date Noted   Hammertoe, bilateral 08/23/2021   History of foot ulcer 08/23/2021   Primary osteoarthritis of both knees 12/09/2020   History of burning pain in leg 08/29/2020   Type 2 diabetes mellitus with neurological complications ([facility] 082/99/3716  Tremor 05/05/2020   Pre-ulcerative corn or callous 01/29/2020   History of transient ischemic attack (TIA) 05/15/2018   Family history of ovarian cancer 04/02/2017   Aortic atherosclerosis ([facility] 08/31/2016   Venous stasis 08/10/2016   Palpitations 07/16/2016   Atrial dilatation, left 07/16/2016   Atrial septal aneurysm 096/78/9381  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   Cataract 04/18/2012   Hyperlipidemia 02/13/2011   Pulmonary nodules 09/22/2010   LEG PAIN 09/12/2009   Sleep apnea 04/13/2009   GERD 03/14/2009   Morbid obesity ([facility] 12/10/2008   Essential hypertension, benign 12/10/2008   RAYNAUD'S DISEASE 12/10/2008   OSTEOARTHRITIS 12/10/2008   URINARY INCONTINENCE 12/10/2008     Past Surgical History:  Procedure Laterality Date   CATARACT EXTRACTION  08/2021   vaginal skin tag removal      OB History     Gravida  4   Para  2   Term  2   Preterm      AB  1   Living         SAB  1   IAB      Ectopic      Multiple      Live Births               Home Medications    Prior to Admission medications   Medication Sig Start Date End Date Taking? Authorizing Provider  acetaminophen (TYLENOL) 650 MG CR tablet Take 650 mg by mouth every 8 (eight) hours as needed.   Yes [provider]  AMBULATORY NON FORMULARY MEDICATION Medication Name: custom fit knee high compression stocking with 15-20 mmHg pressure. Open toe preferred. 08/10/16  Yes MHali Marry MD  aspirin 325 MG tablet Take 325 mg by mouth daily.   Yes [provider]  cetirizine (ZYRTEC) 10 MG tablet Take 10 mg by mouth daily.   Yes [provider]  chlorthalidone (HYGROTON) 25 MG tablet TAKE 1 TABLET DAILY 11/06/21  Yes MHali Marry MD  clobetasol cream (TEMOVATE) 00.17% Apply 1 application topically  2 (two) times daily. 07/18/21  Yes Hali Marry, MD  clopidogrel (PLAVIX) 75 MG tablet Take 1 tablet (75 mg total) by mouth daily. 07/18/21  Yes Hali Marry, MD  diltiazem (CARDIZEM CD) 360 MG 24 hr capsule TAKE 1 CAPSULE(360 MG) BY MOUTH DAILY 09/30/17  Yes Hali Marry, MD  doxycycline (VIBRAMYCIN) 100 MG capsule Take 1 capsule (100 mg total) by mouth 2 (two) times daily. Take with food 12/28/21  Yes Raylene Everts, MD  Dulaglutide (TRULICITY) 4.5 DV/7.6HY SOPN Inject 4.5 mg as directed once a week. 08/07/21  Yes Hali Marry, MD  Ferrous Sulfate Dried 200 (65 Fe) MG TABS Take 1 tablet by mouth daily.   Yes [provider]  fluorometholone (FML) 0.1 % ophthalmic suspension SMARTSIG:In Eye(s) 12/25/21  Yes [provider]  ibuprofen (ADVIL) 600 MG tablet  02/07/21  Yes [provider]   Insulin Pen Needle (NOVOFINE) 30G X 8 MM MISC For use each time injection for insulin 02/10/16  Yes Hali Marry, MD  JARDIANCE 25 MG TABS tablet TAKE 1 TABLET DAILY 03/22/21  Yes Hali Marry, MD  LANTUS 100 UNIT/ML injection INJECT 35 UNITS UNDER THE SKIN DAILY Patient taking differently: Patient currently taking 25 units daily 09/20/20  Yes Hali Marry, MD  latanoprost (XALATAN) 0.005 % ophthalmic solution 1 drop.   Yes [provider]  lisinopril (ZESTRIL) 5 MG tablet Take 1 tablet (5 mg total) by mouth daily. 07/18/21  Yes Hali Marry, MD  meclizine (ANTIVERT) 25 MG tablet TAKE 1 TABLET THREE TIMES A DAY AS NEEDED FOR DIZZINESS 08/30/21  Yes Hali Marry, MD  metFORMIN (GLUCOPHAGE) 1000 MG tablet TAKE 1 TABLET TWICE A DAY WITH MEALS 10/10/21  Yes Hali Marry, MD  Misc. Devices MISC Transfer of care to [facility].  Please provide service to her current CPAP machine for OSA at 14 cm. water pressure.  Send directly to [place] at [facility]. 05/15/19  Yes [provider]  Omega-3 Fatty Acids (FISH OIL) 1200 MG CAPS Take 1 capsule by mouth daily.   Yes [provider]  pantoprazole (PROTONIX) 40 MG tablet Take 1 tablet (40 mg total) by mouth daily. 07/18/21  Yes Hali Marry, MD  rosuvastatin (CRESTOR) 10 MG tablet Take 1 tablet (10 mg total) by mouth once a week. At bedtime. 12/09/20  Yes Hali Marry, MD  sucralfate (CARAFATE) 1 GM/10ML suspension Take 10 mLs (1 g total) by mouth 4 (four) times daily -  with meals and at bedtime. 10/03/20  Yes Hali Marry, MD    Family History Family History  Problem Relation Age of Onset   Diabetes Mother    Hypertension Mother    Glaucoma Mother    Cancer Mother        bone cancer   Alcohol abuse Father    Emphysema Father    Other Father        CHF   Ovarian cancer Sister     Social History Social History   Tobacco Use   Smoking status: Former    Smokeless tobacco: Never  Scientific laboratory technician Use: Never used  Substance Use Topics   Alcohol use: Yes    Comment: social   Drug use: No     Allergies   Atorvastatin, Beclomethasone, Cefaclor, Pioglitazone, Pravastatin, Sulfa antibiotics, Cephalexin, Cephalexin, Lipitor [atorvastatin calcium], and Statins   Review of Systems Review of Systems See HPI  Physical Exam Triage  Vital Signs ED Triage Vitals  Enc Vitals Group     BP 12/28/21 1047 135/68     Pulse Rate 12/28/21 1047 60     Resp 12/28/21 1047 18     Temp 12/28/21 1047 98.5 F (36.9 C)     Temp Source 12/28/21 1047 Oral     SpO2 12/28/21 1047 96 %     Weight --      Height --      Head Circumference --      Peak Flow --      Pain Score 12/28/21 1048 2     Pain Loc --      Pain Edu? --      Excl. in [place]? --    No data found.  Updated Vital Signs BP 135/68 (BP Location: Right Arm)   Pulse 60   Temp 98.5 F (36.9 C) (Oral)   Resp 18   SpO2 96%       Physical Exam Constitutional:      General: She is not in acute distress.    Appearance: She is well-developed. She is obese. She is not ill-appearing.  HENT:     Head: Normocephalic and atraumatic.  Eyes:     Conjunctiva/sclera: Conjunctivae normal.     Pupils: Pupils are equal, round, and reactive to light.  Cardiovascular:     Rate and Rhythm: Normal rate.  Pulmonary:     Effort: Pulmonary effort is normal. No respiratory distress.  Abdominal:     General: There is no distension.     Palpations: Abdomen is soft.  Musculoskeletal:        General: Normal range of motion.     Cervical back: Normal range of motion.  Skin:    General: Skin is warm and dry.     Findings: Lesion present.     Comments: See photograph  Neurological:     General: No focal deficit present.     Mental Status: She is alert.  Psychiatric:        Mood and Affect: Mood normal.        Behavior: Behavior normal.       UC Treatments / Results  Labs (all labs ordered  are listed, but only abnormal results are displayed) Labs Reviewed - No data to display  EKG   Radiology No results found.  Procedures Procedures (including critical care time)  Medications Ordered in UC Medications - No data to display  Initial Impression / Assessment and Plan / UC Course  I have reviewed the triage vital signs and the nursing notes.  Pertinent labs & imaging results that were available during my care of the patient were reviewed by me and considered in my medical decision making (see chart for details).     I explained to the patient that her insect bite like erythema migrans from a tick.  We will treat accordingly with doxycycline.  Follow-up with primary Final Clinical Impressions(s) / UC Diagnoses   Final diagnoses:  Erythema migrans (Lyme disease)     Discharge Instructions      Take the antibiotic 2 times a day.  It is important to take with food If the area is itching take an antihistamine like Claritin or Zyrtec May put a cortisone cream on the area Follow-up with your primary care doctor   ED Prescriptions     Medication Sig Dispense Auth. Provider   doxycycline (VIBRAMYCIN) 100 MG capsule Take 1 capsule (100 mg total) by mouth  2 (two) times daily. Take with food 28 capsule Raylene Everts, MD      PDMP not reviewed this encounter.   Raylene Everts, MD 12/28/21 1201

## 2021-12-28 NOTE — Discharge Instructions (Addendum)
Take the antibiotic 2 times a day.  It is important to take with food If the area is itching take an antihistamine like Claritin or Zyrtec May put a cortisone cream on the area Follow-up with your primary care doctor

## 2021-12-30 ENCOUNTER — Encounter: Payer: Self-pay | Admitting: Family Medicine

## 2022-01-12 ENCOUNTER — Ambulatory Visit (INDEPENDENT_AMBULATORY_CARE_PROVIDER_SITE_OTHER): Payer: Medicare Other | Admitting: Physician Assistant

## 2022-01-12 ENCOUNTER — Encounter: Payer: Self-pay | Admitting: Physician Assistant

## 2022-01-12 VITALS — BP 145/79 | HR 93 | Ht 61.0 in | Wt 322.0 lb

## 2022-01-12 DIAGNOSIS — R002 Palpitations: Secondary | ICD-10-CM | POA: Diagnosis not present

## 2022-01-12 DIAGNOSIS — M2550 Pain in unspecified joint: Secondary | ICD-10-CM | POA: Diagnosis not present

## 2022-01-12 DIAGNOSIS — L299 Pruritus, unspecified: Secondary | ICD-10-CM | POA: Diagnosis not present

## 2022-01-12 DIAGNOSIS — A6920 Lyme disease, unspecified: Secondary | ICD-10-CM

## 2022-01-12 DIAGNOSIS — M25552 Pain in left hip: Secondary | ICD-10-CM | POA: Insufficient documentation

## 2022-01-12 DIAGNOSIS — H6121 Impacted cerumen, right ear: Secondary | ICD-10-CM | POA: Diagnosis not present

## 2022-01-12 MED ORDER — TRIAMCINOLONE ACETONIDE 0.1 % EX CREA: 1.0000 | TOPICAL_CREAM | Freq: Two times a day (BID) | CUTANEOUS | 0 refills | Status: DC

## 2022-01-12 MED ORDER — DICLOFENAC SODIUM 75 MG PO TBEC: 75.0000 mg | DELAYED_RELEASE_TABLET | Freq: Two times a day (BID) | ORAL | 1 refills | Status: DC

## 2022-01-12 NOTE — Patient Instructions (Signed)
Diclofenac up to twice a day for joint pain

## 2022-01-12 NOTE — Progress Notes (Signed)
Established Patient Office Visit  Subjective   Patient ID: Natalie Wright, female    DOB: 03-10-1951  Age: 71 y.o. MRN: [id number]  Chief Complaint  Patient presents with   Insect Bite    HPI Pt is a 71 yo female who presents to the clinic to follow up after ED visit for tick bite and rash on 6/29. She did have erythema migrans rash and treated with doxycycline. She took her last dose of doxycycline today. She is feeling better. Her last palpitations occurred 3 days ago. She does continue to itch over bite site and having bilateral hip and knee pain that is worse since the bite. No fever, chills.   She does want her right ear looked at because it feels "clogged".    Patient Active Problem List   Diagnosis Date Noted   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/2022   Arthralgia 01/12/2022   Hearing loss due to cerumen impaction, right 01/12/2022   Itching 01/12/2022   Hammertoe, bilateral 08/23/2021   History of foot ulcer 08/23/2021   Primary osteoarthritis of both knees 12/09/2020   History of burning pain in leg 08/29/2020   Type 2 diabetes mellitus with neurological complications ([place]) 95/18/8416   Tremor 05/05/2020   Pre-ulcerative corn or callous 01/29/2020   History of transient ischemic attack (TIA) 05/15/2018   Family history of ovarian cancer 04/02/2017   Aortic atherosclerosis ([place]) 08/31/2016   Venous stasis 08/10/2016   Palpitations 07/16/2016   Atrial dilatation, left 07/16/2016   Atrial septal aneurysm 60/63/0160   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   Cataract 04/18/2012   Hyperlipidemia 02/13/2011   Pulmonary nodules 09/22/2010   LEG PAIN 09/12/2009   Sleep apnea 04/13/2009   GERD 03/14/2009   Morbid obesity ([place]) 12/10/2008   Essential hypertension, benign 12/10/2008   RAYNAUD'S DISEASE 12/10/2008   OSTEOARTHRITIS 12/10/2008   URINARY INCONTINENCE 12/10/2008   Past Medical History:  Diagnosis Date   Allergy    to cats and dogs   Diabetes mellitus     Hiatal hernia    and delayed gastric emptying/ sees [facility] for recurrent diarrhea   History of cardiovascular disorder 12/10/2008   Qualifier: Diagnosis of  By: Madilyn Fireman MD, Catherine     Hypertension    OSA (obstructive sleep apnea)    CPAP 52mhg   Pedal edema    Spastic bladder    urology in [place]   TIA (transient ischemic attack)    hx of- sees Dr SElwin Mocha(Neurology)   Family History  Problem Relation Age of Onset   Diabetes Mother    Hypertension Mother    Glaucoma Mother    Cancer Mother        bone cancer   Alcohol abuse Father    Emphysema Father    Other Father        CHF   Ovarian cancer Sister    Allergies  Allergen Reactions   Atorvastatin Other (See Comments)    Patient stated muscle weakness Patient stated muscle weakness   Beclomethasone Other (See Comments)    unknown unknown   Cefaclor Hives   Pioglitazone Other (See Comments)    Mouth sores Mouth sores Mouth sores unknown REACTION: ulcers in the mouth   Pravastatin Other (See Comments)    Chest pain.  Chest pain.    Sulfa Antibiotics Hives    Other reaction(s): Agitation   Cephalexin Other (See Comments)    unknown   Lipitor [Atorvastatin Calcium] Other (See Comments)    myalgias  Statins Other (See Comments)    Muscle pain Other reaction(s): Other (See Comments) Muscle pain      ROS    Objective:     BP (!) 145/79   Pulse 93   Ht 5' 1"  (1.549 m)   Wt (!) 322 lb (146.1 kg)   SpO2 100%   BMI 60.84 kg/m  BP Readings from Last 3 Encounters:  01/12/22 (!) 145/79  12/28/21 135/68  11/23/21 (!) 131/51      Physical Exam Constitutional:      Appearance: Normal appearance. She is obese.  HENT:     Head: Normocephalic.  Neck:     Vascular: No carotid bruit.  Cardiovascular:     Rate and Rhythm: Normal rate and regular rhythm.     Pulses: Normal pulses.     Heart sounds: Murmur heard.  Pulmonary:     Effort: Pulmonary effort is normal.     Breath sounds: Normal breath  sounds.  Skin:    Comments: Right posterior shoulder: rash resolved scaly papule present with evidence of excoriations.   Neurological:     General: No focal deficit present.     Mental Status: She is alert and oriented to person, place, and time.  Psychiatric:        Mood and Affect: Mood normal.    ..Cerumen Removal Template: Indication: Cerumen impaction of the ear(s) Medical necessity statement: On physical examination, cerumen impairs clinically significant portions of the external auditory canal, and tympanic membrane. Noted obstructive, copious cerumen that cannot be removed without magnification and instrumentations requiring physician skills Consent: Discussed benefits and risks of procedure and verbal consent obtained Procedure: Patient was prepped for the procedure. Utilized an otoscope to assess and take note of the ear canal, the tympanic membrane, and the presence, amount, and placement of the cerumen. Gentle water irrigation and soft plastic curette was utilized to remove cerumen.  Post procedure examination shows cerumen was completely removed. Patient tolerated procedure well. The patient is made aware that they may experience temporary vertigo, temporary hearing loss, and temporary discomfort. If these symptom last for more than 24 hours to call the clinic or proceed to the ED.     Assessment & Plan:  Marland KitchenMarland KitchenKarmin was seen today for insect bite.  Diagnoses and all orders for this visit:  Erythema migrans (Lyme disease) -     TSH -     COMPLETE METABOLIC PANEL WITH GFR -     B. burgdorfi antibodies -     CBC w/Diff/Platelet -     Sed Rate (ESR) -     C-reactive protein  Arthralgia, unspecified joint -     diclofenac (VOLTAREN) 75 MG EC tablet; Take 1 tablet (75 mg total) by mouth 2 (two) times daily. -     TSH -     COMPLETE METABOLIC PANEL WITH GFR -     B. burgdorfi antibodies -     CBC w/Diff/Platelet -     Sed Rate (ESR) -     C-reactive  protein  Palpitations -     TSH -     COMPLETE METABOLIC PANEL WITH GFR -     B. burgdorfi antibodies -     CBC w/Diff/Platelet -     Sed Rate (ESR) -     C-reactive protein  Hearing loss due to cerumen impaction, right  Itching -     triamcinolone cream (KENALOG) 0.1 %; Apply 1 Application topically 2 (two) times daily.  Will get labs today to confirm lymes disease and look at inflammation in body Start diclofenac bid with food Discussed side effects Kidney function looked good Triamcinolone given for itching Reassurance given since she has completed her doxycycline.  Cerumen removed from right ear Pt feels much better  Iran Planas, PA-C

## 2022-01-13 LAB — CBC WITH DIFFERENTIAL/PLATELET
Absolute Monocytes: 348 cells/uL (ref 200–950)
Basophils Absolute: 44 cells/uL (ref 0–200)
Basophils Relative: 0.5 %
Eosinophils Absolute: 104 cells/uL (ref 15–500)
Eosinophils Relative: 1.2 %
HCT: 45.6 % — ABNORMAL HIGH (ref 35.0–45.0)
Hemoglobin: 14.6 g/dL (ref 11.7–15.5)
Lymphs Abs: 1653 cells/uL (ref 850–3900)
MCH: 28.1 pg (ref 27.0–33.0)
MCHC: 32 g/dL (ref 32.0–36.0)
MCV: 87.9 fL (ref 80.0–100.0)
MPV: 9.3 fL (ref 7.5–12.5)
Monocytes Relative: 4 %
Neutro Abs: 6551 cells/uL (ref 1500–7800)
Neutrophils Relative %: 75.3 %
Platelets: 319 10*3/uL (ref 140–400)
RBC: 5.19 10*6/uL — ABNORMAL HIGH (ref 3.80–5.10)
RDW: 12.9 % (ref 11.0–15.0)
Total Lymphocyte: 19 %
WBC: 8.7 10*3/uL (ref 3.8–10.8)

## 2022-01-13 LAB — COMPLETE METABOLIC PANEL WITH GFR
AG Ratio: 1.4 (calc) (ref 1.0–2.5)
ALT: 16 U/L (ref 6–29)
AST: 23 U/L (ref 10–35)
Albumin: 4.3 g/dL (ref 3.6–5.1)
Alkaline phosphatase (APISO): 77 U/L (ref 37–153)
BUN: 10 mg/dL (ref 7–25)
CO2: 31 mmol/L (ref 20–32)
Calcium: 9.5 mg/dL (ref 8.6–10.4)
Chloride: 100 mmol/L (ref 98–110)
Creat: 0.67 mg/dL (ref 0.60–1.00)
Globulin: 3 g/dL (calc) (ref 1.9–3.7)
Glucose, Bld: 91 mg/dL (ref 65–99)
Potassium: 3.3 mmol/L — ABNORMAL LOW (ref 3.5–5.3)
Sodium: 143 mmol/L (ref 135–146)
Total Bilirubin: 0.4 mg/dL (ref 0.2–1.2)
Total Protein: 7.3 g/dL (ref 6.1–8.1)
eGFR: 94 mL/min/{1.73_m2} (ref >=60)

## 2022-01-13 LAB — SEDIMENTATION RATE: Sed Rate: 31 mm/h — ABNORMAL HIGH (ref 0–30)

## 2022-01-13 LAB — TSH: TSH: 1.56 mIU/L (ref 0.40–4.50)

## 2022-01-13 LAB — C-REACTIVE PROTEIN: CRP: 11.7 mg/L — ABNORMAL HIGH (ref <8.0)

## 2022-01-13 LAB — B. BURGDORFI ANTIBODIES: B burgdorferi Ab IgG+IgM: <0.9 index

## 2022-01-15 ENCOUNTER — Other Ambulatory Visit: Payer: Self-pay

## 2022-01-15 ENCOUNTER — Other Ambulatory Visit: Payer: Self-pay | Admitting: Physician Assistant

## 2022-01-15 DIAGNOSIS — E876 Hypokalemia: Secondary | ICD-10-CM

## 2022-01-15 MED ORDER — POTASSIUM CHLORIDE CRYS ER 10 MEQ PO TBCR: 10.0000 meq | EXTENDED_RELEASE_TABLET | Freq: Every day | ORAL | 0 refills | Status: DC

## 2022-01-15 NOTE — Progress Notes (Signed)
Negative for any antibodies for lymes disease.  Your inflammation markers are elevated.  Your potassium is low.  Start potassium 106mq daily and recheck potassium on Thursday.  You are still taking chlorthalidone?

## 2022-01-15 NOTE — Progress Notes (Signed)
Ordered labs per results.  

## 2022-01-19 DIAGNOSIS — H20021 Recurrent acute iridocyclitis, right eye: Secondary | ICD-10-CM | POA: Diagnosis not present

## 2022-01-19 LAB — HM DIABETES EYE EXAM

## 2022-01-22 ENCOUNTER — Encounter: Payer: Self-pay | Admitting: Physician Assistant

## 2022-01-22 DIAGNOSIS — E876 Hypokalemia: Secondary | ICD-10-CM | POA: Diagnosis not present

## 2022-01-23 LAB — BASIC METABOLIC PANEL WITH GFR
BUN: 16 mg/dL (ref 7–25)
CO2: 27 mmol/L (ref 20–32)
Calcium: 9.4 mg/dL (ref 8.6–10.4)
Chloride: 101 mmol/L (ref 98–110)
Creat: 0.76 mg/dL (ref 0.60–1.00)
Glucose, Bld: 131 mg/dL — ABNORMAL HIGH (ref 65–99)
Potassium: 4.8 mmol/L (ref 3.5–5.3)
Sodium: 142 mmol/L (ref 135–146)
eGFR: 84 mL/min/{1.73_m2} (ref >=60)

## 2022-01-23 NOTE — Progress Notes (Signed)
Your lab work is within acceptable range and there are no concerning findings.   ?

## 2022-01-24 ENCOUNTER — Encounter: Payer: Self-pay | Admitting: Family Medicine

## 2022-01-24 NOTE — Telephone Encounter (Signed)
Okay, sounds good.  Lets update her med list and she can continue with her current regimen.

## 2022-01-24 NOTE — Telephone Encounter (Signed)
Updated medication list

## 2022-01-29 ENCOUNTER — Other Ambulatory Visit: Payer: Self-pay | Admitting: Family Medicine

## 2022-01-29 DIAGNOSIS — H20021 Recurrent acute iridocyclitis, right eye: Secondary | ICD-10-CM | POA: Diagnosis not present

## 2022-01-29 DIAGNOSIS — H35351 Cystoid macular degeneration, right eye: Secondary | ICD-10-CM | POA: Diagnosis not present

## 2022-02-01 ENCOUNTER — Ambulatory Visit (INDEPENDENT_AMBULATORY_CARE_PROVIDER_SITE_OTHER): Payer: Medicare Other | Admitting: Podiatry

## 2022-02-01 DIAGNOSIS — Z7901 Long term (current) use of anticoagulants: Secondary | ICD-10-CM | POA: Diagnosis not present

## 2022-02-01 DIAGNOSIS — L84 Corns and callosities: Secondary | ICD-10-CM

## 2022-02-01 DIAGNOSIS — E1149 Type 2 diabetes mellitus with other diabetic neurological complication: Secondary | ICD-10-CM | POA: Diagnosis not present

## 2022-02-01 DIAGNOSIS — B351 Tinea unguium: Secondary | ICD-10-CM

## 2022-02-01 DIAGNOSIS — M79674 Pain in right toe(s): Secondary | ICD-10-CM | POA: Diagnosis not present

## 2022-02-01 DIAGNOSIS — M79675 Pain in left toe(s): Secondary | ICD-10-CM

## 2022-02-05 NOTE — Progress Notes (Signed)
Subjective: 71 y.o. returns the office today for painful, elongated, thickened toenails which they cannot trim herself.  She denies any open sores.  She also has calluses to her feet.  She states that the inserts that she has not yet picked them up.   PCP: Hali Marry, MD Last seen December 13 2021 A1c: 6.5 on Nov 23, 2021  Objective: AAO 3, NAD DP/PT pulses palpable, CRT less than 3 seconds Protective sensation decreased with Simms Weinstein monofilament Nails hypertrophic, dystrophic, elongated, brittle, discolored 10. There is tenderness overlying the nails 1-5 bilaterally. There is no surrounding erythema or drainage along the nail sites. Hyperkeratotic lesion right foot submetatarsal 5, metatarsal base as well as the heel.  No underlying ulceration drainage or any signs of infection noted today. Decrease in arch upon weightbearing.  No open lesions or other pre-ulcerative lesions are identified. No pain with calf compression, swelling, warmth, erythema.  Assessment: Patient presents with symptomatic onychomycosis, hyperkeratotic lesions  Plan: -Treatment options including alternatives, risks, complications were discussed -Nails sharply debrided 10 without complication/bleeding. -Hyperkeratotic lesion sharply debrided x 4 without any complications or bleeding.  Recommend moisturizer daily.  -Inserts were dispensed by Aaron Edelman, orthotist -Discussed daily foot inspection. If there are any changes, to call the office immediately.  -Follow-up in 3 months or sooner if any problems are to arise. In the meantime, encouraged to call the office with any questions, concerns, changes symptoms.  Celesta Gentile, DPM

## 2022-02-08 ENCOUNTER — Other Ambulatory Visit: Payer: Self-pay | Admitting: Neurology

## 2022-02-08 MED ORDER — TRULICITY 4.5 MG/0.5ML ~~LOC~~ SOAJ: 4.5000 mg | SUBCUTANEOUS | 0 refills | Status: DC

## 2022-02-14 DIAGNOSIS — H35351 Cystoid macular degeneration, right eye: Secondary | ICD-10-CM | POA: Diagnosis not present

## 2022-02-18 ENCOUNTER — Other Ambulatory Visit: Payer: Self-pay | Admitting: Family Medicine

## 2022-02-18 DIAGNOSIS — I10 Essential (primary) hypertension: Secondary | ICD-10-CM

## 2022-02-21 ENCOUNTER — Encounter: Payer: Self-pay | Admitting: General Practice

## 2022-02-26 ENCOUNTER — Encounter: Payer: Self-pay | Admitting: Family Medicine

## 2022-02-26 ENCOUNTER — Ambulatory Visit (INDEPENDENT_AMBULATORY_CARE_PROVIDER_SITE_OTHER): Payer: Medicare Other | Admitting: Family Medicine

## 2022-02-26 VITALS — BP 123/81 | HR 65 | Ht 61.0 in | Wt 331.0 lb

## 2022-02-26 DIAGNOSIS — E876 Hypokalemia: Secondary | ICD-10-CM | POA: Diagnosis not present

## 2022-02-26 DIAGNOSIS — E1149 Type 2 diabetes mellitus with other diabetic neurological complication: Secondary | ICD-10-CM

## 2022-02-26 DIAGNOSIS — M5416 Radiculopathy, lumbar region: Secondary | ICD-10-CM

## 2022-02-26 DIAGNOSIS — M2550 Pain in unspecified joint: Secondary | ICD-10-CM | POA: Diagnosis not present

## 2022-02-26 DIAGNOSIS — I10 Essential (primary) hypertension: Secondary | ICD-10-CM | POA: Diagnosis not present

## 2022-02-26 DIAGNOSIS — M48061 Spinal stenosis, lumbar region without neurogenic claudication: Secondary | ICD-10-CM | POA: Insufficient documentation

## 2022-02-26 LAB — POCT GLYCOSYLATED HEMOGLOBIN (HGB A1C): HbA1c POC (<> result, manual entry): 7.6 % (ref 4.0–5.6)

## 2022-02-26 LAB — POCT UA - MICROALBUMIN
Albumin/Creatinine Ratio, Urine, POC: <30
Creatinine, POC: 300 mg/dL
Microalbumin Ur, POC: 10 mg/L

## 2022-02-26 MED ORDER — CHLORTHALIDONE 25 MG PO TABS: 25.0000 mg | ORAL_TABLET | ORAL | 1 refills | Status: DC

## 2022-02-26 MED ORDER — DULOXETINE HCL 30 MG PO CPEP: 30.0000 mg | ORAL_CAPSULE | Freq: Every day | ORAL | 1 refills | Status: DC

## 2022-02-26 NOTE — Progress Notes (Signed)
Established Patient Office Visit  Subjective   Patient ID: Natalie Wright, female    DOB: 05/17/1951  Age: 71 y.o. MRN: [id number]  Chief Complaint  Patient presents with   Follow-up    HPI  Diabetes - no hypoglycemic events. No wounds or sores that are not healing well. No increased thirst or urination. Checking glucose at home. Taking medications as prescribed without any side effects.  Hypertension- Pt denies chest pain, SOB, dizziness, or heart palpitations.  Taking meds as directed w/o problems.  Denies medication side effects.    She also wanted to update me on her family history she has already had 1 sister who passed of ovarian Cancer.  Her other sister was recently diagnosed with endometrial stromal sarcoma after having had a hysterectomy for fibroids.  She also wanted to follow-up on her low potassium that was noted after she had a recent tick bite.  She did take the extra potassium for about a month but is off of it now.  She also is wants to have something for her joint pain and particularly the pain radiating from her right hip to her right knee.  She says Tylenol arthritis does not seem to help.  She had mentioned it to [facility] while she was here who wrote for Voltaren.  The Voltaren really was not helpful either.  So she wanted to know what other options there might be.  She also reports intermittent abdominal pain and vomiting.  But she also found an old study where she had a small bowel follow-through which did show delayed transit time back in 2009 will scan report into chart.  She says since finding that she has adjusted her diet and she is eating smaller portions and has cut out more gas producing foods and vegetables and has noticed some improvement in her symptoms over the last 3 weeks.    ROS Family History  Problem Relation Age of Onset   Diabetes Mother    Hypertension Mother    Glaucoma Mother    Cancer Mother        bone cancer   Alcohol abuse Father     Emphysema Father    Other Father        CHF   Ovarian cancer Sister    Endometrial cancer Sister        Endometrial stromal sarcoma      Objective:     BP 123/81   Pulse 65   Ht '5\' 1"'$  (1.549 m)   Wt (!) 331 lb (150.1 kg)   SpO2 99%   BMI 62.54 kg/m    Physical Exam Vitals and nursing note reviewed.  Constitutional:      Appearance: She is well-developed.  HENT:     Head: Normocephalic and atraumatic.  Cardiovascular:     Rate and Rhythm: Normal rate and regular rhythm.     Heart sounds: Normal heart sounds.  Pulmonary:     Effort: Pulmonary effort is normal.     Breath sounds: Normal breath sounds.  Skin:    General: Skin is warm and dry.  Neurological:     Mental Status: She is alert and oriented to person, place, and time.  Psychiatric:        Behavior: Behavior normal.      Results for orders placed or performed in visit on 02/26/22  POCT HgB A1C  Result Value Ref Range   Hemoglobin A1C     HbA1c POC (<> result, manual entry) 7.6  4.0 - 5.6 %   HbA1c, POC (prediabetic range)     HbA1c, POC (controlled diabetic range)    POCT UA - Microalbumin  Result Value Ref Range   Microalbumin Ur, POC 10 mg/L   Creatinine, POC 300 mg/dL   Albumin/Creatinine Ratio, Urine, POC <30       The 10-year ASCVD risk score (Arnett DK, et al., 2019) is: 21.1%    Assessment & Plan:   Problem List Items Addressed This Visit       Cardiovascular and Mediastinum   Essential hypertension, benign    Blood pressure looks phenomenal today.      Relevant Medications   chlorthalidone (HYGROTON) 25 MG tablet   Other Relevant Orders   BASIC METABOLIC PANEL WITH GFR     Endocrine   Type 2 diabetes mellitus with neurological complications (HCC) - Primary    A1c up to 7.6.  She has been keeping it under 7 all year up until recently but she just had more difficulty with consistency with her insulin injections and she is having to do 4 times a day steroid drops in her eyes.   Unfortunately they have had significant difficulty weaning the steroid drops after her recent surgery.  Just encouraged her to continue to work at it and get back on track.  I am not can make any changes today but I will see her back in 3 months.  I think also being able to hopefully get off the steroid drops soon will help as well.      Relevant Orders   POCT HgB A1C (Completed)   POCT UA - Microalbumin (Completed)   BASIC METABOLIC PANEL WITH GFR     Nervous and Auditory   Right lumbar radiculopathy   Relevant Medications   DULoxetine (CYMBALTA) 30 MG capsule     Other   Arthralgia    For the arthralgia and right lumbar radiculopathy discussed a couple of options that might be helpful I really would like to stay away from NSAIDs because she is already on Plavix.  Until arthritis is not helping.  We discussed possibly a trial of duloxetine versus gabapentin.  She would like to try the duloxetine because she also sometimes gets some tingling around her ankles.      Relevant Medications   DULoxetine (CYMBALTA) 30 MG capsule   Other Visit Diagnoses     Hypokalemia           Hypokalemia-due to recheck potassium today.  Return in about 14 weeks (around 06/04/2022) for Diabetes follow-up.   I spent 40 minutes on the day of the encounter to include pre-visit record review, face-to-face time with the patient and post visit ordering of test.   Beatrice Lecher, MD

## 2022-02-26 NOTE — Assessment & Plan Note (Signed)
Blood pressure looks phenomenal today. 

## 2022-02-26 NOTE — Assessment & Plan Note (Signed)
A1c up to 7.6.  She has been keeping it under 7 all year up until recently but she just had more difficulty with consistency with her insulin injections and she is having to do 4 times a day steroid drops in her eyes.  Unfortunately they have had significant difficulty weaning the steroid drops after her recent surgery.  Just encouraged her to continue to work at it and get back on track.  I am not can make any changes today but I will see her back in 3 months.  I think also being able to hopefully get off the steroid drops soon will help as well.

## 2022-02-26 NOTE — Assessment & Plan Note (Signed)
For the arthralgia and right lumbar radiculopathy discussed a couple of options that might be helpful I really would like to stay away from NSAIDs because she is already on Plavix.  Until arthritis is not helping.  We discussed possibly a trial of duloxetine versus gabapentin.  She would like to try the duloxetine because she also sometimes gets some tingling around her ankles.

## 2022-02-27 LAB — BASIC METABOLIC PANEL WITH GFR
BUN/Creatinine Ratio: 22 (calc) (ref 6–22)
BUN: 25 mg/dL (ref 7–25)
CO2: 27 mmol/L (ref 20–32)
Calcium: 9.8 mg/dL (ref 8.6–10.4)
Chloride: 100 mmol/L (ref 98–110)
Creat: 1.13 mg/dL — ABNORMAL HIGH (ref 0.60–1.00)
Glucose, Bld: 317 mg/dL — ABNORMAL HIGH (ref 65–99)
Potassium: 4.2 mmol/L (ref 3.5–5.3)
Sodium: 139 mmol/L (ref 135–146)
eGFR: 52 mL/min/{1.73_m2} — ABNORMAL LOW (ref >=60)

## 2022-02-27 NOTE — Progress Notes (Signed)
Hi Natalie Wright, your kidney function jumped up from a month ago on them really not sure why I do not think anything major is changed.  Your potassium is normal so nothing worrisome there and I know you have not been taking the potassium so you do not need to take extra right now.  I would like to recheck that BMP in about a week or 2 to just make sure that it is coming back down.  You do look just a little bit more dry on your blood work so that could be contributing to the elevated creatinine level.

## 2022-03-11 ENCOUNTER — Other Ambulatory Visit: Payer: Self-pay | Admitting: Physician Assistant

## 2022-03-11 DIAGNOSIS — M2550 Pain in unspecified joint: Secondary | ICD-10-CM

## 2022-03-14 DIAGNOSIS — H35351 Cystoid macular degeneration, right eye: Secondary | ICD-10-CM | POA: Diagnosis not present

## 2022-03-14 DIAGNOSIS — H20021 Recurrent acute iridocyclitis, right eye: Secondary | ICD-10-CM | POA: Diagnosis not present

## 2022-03-16 ENCOUNTER — Telehealth: Payer: Self-pay | Admitting: Family Medicine

## 2022-03-16 DIAGNOSIS — R7989 Other specified abnormal findings of blood chemistry: Secondary | ICD-10-CM | POA: Diagnosis not present

## 2022-03-16 NOTE — Telephone Encounter (Signed)
Orders Placed This Encounter  Procedures   BASIC METABOLIC PANEL WITH GFR

## 2022-03-17 LAB — BASIC METABOLIC PANEL WITH GFR
BUN/Creatinine Ratio: 25 (calc) — ABNORMAL HIGH (ref 6–22)
BUN: 26 mg/dL — ABNORMAL HIGH (ref 7–25)
CO2: 26 mmol/L (ref 20–32)
Calcium: 9.1 mg/dL (ref 8.6–10.4)
Chloride: 98 mmol/L (ref 98–110)
Creat: 1.02 mg/dL — ABNORMAL HIGH (ref 0.60–1.00)
Glucose, Bld: 248 mg/dL — ABNORMAL HIGH (ref 65–99)
Potassium: 4.4 mmol/L (ref 3.5–5.3)
Sodium: 135 mmol/L (ref 135–146)
eGFR: 59 mL/min/{1.73_m2} — ABNORMAL LOW (ref >=60)

## 2022-03-19 NOTE — Progress Notes (Signed)
Natalie Wright, kidney function does look a little bit better so it is back down to 1.0.  It does look like you are little bit dry on your blood work.  So at this point lets just keep an eye on it and plan to recheck again in about 6 weeks and see if it just trends back to your baseline.

## 2022-03-20 ENCOUNTER — Ambulatory Visit (INDEPENDENT_AMBULATORY_CARE_PROVIDER_SITE_OTHER): Payer: Medicare Other | Admitting: Family Medicine

## 2022-03-20 ENCOUNTER — Encounter: Payer: Self-pay | Admitting: Family Medicine

## 2022-03-20 VITALS — BP 126/37 | HR 77 | Wt 307.0 lb

## 2022-03-20 DIAGNOSIS — R112 Nausea with vomiting, unspecified: Secondary | ICD-10-CM

## 2022-03-20 DIAGNOSIS — R1084 Generalized abdominal pain: Secondary | ICD-10-CM | POA: Diagnosis not present

## 2022-03-20 MED ORDER — TRULICITY 3 MG/0.5ML ~~LOC~~ SOAJ: 3.0000 mg | SUBCUTANEOUS | 0 refills | Status: DC

## 2022-03-20 NOTE — Progress Notes (Signed)
Established Patient Office Visit  Subjective   Patient ID: Natalie Wright, female    DOB: 1950/08/16  Age: 71 y.o. MRN: [id number]  Chief Complaint  Patient presents with   Diarrhea   Emesis    HPI  She also reports intermittent abdominal pain, nausea, and vomiting, for 2 months.  But she also found an old study where she had a small bowel follow-through which did show delayed transit time back in 2009 will scan report into chart.  She says since finding that she has adjusted her diet and she is eating smaller portions and has cut out more gas producing foods and vegetables and has noticed some improvement in her symptoms over the last 3 weeks.  Been on the higher dose of Trulicity for about 6 months.    ROS    Objective:     BP (!) 126/37   Pulse 77   Wt (!) 307 lb (139.3 kg)   SpO2 98%   BMI 58.01 kg/m    Physical Exam Vitals and nursing note reviewed.  Constitutional:      Appearance: Normal appearance. She is well-developed.  HENT:     Head: Normocephalic and atraumatic.     Right Ear: External ear normal.     Left Ear: External ear normal.     Nose: Nose normal.     Mouth/Throat:     Mouth: Mucous membranes are moist.     Pharynx: Oropharynx is clear. No posterior oropharyngeal erythema.  Neck:     Thyroid: No thyromegaly.  Cardiovascular:     Rate and Rhythm: Normal rate and regular rhythm.     Heart sounds: Normal heart sounds.  Pulmonary:     Effort: Pulmonary effort is normal.     Breath sounds: Normal breath sounds. No wheezing.  Musculoskeletal:     Cervical back: Neck supple.  Lymphadenopathy:     Cervical: No cervical adenopathy.  Skin:    General: Skin is warm and dry.  Neurological:     Mental Status: She is alert and oriented to person, place, and time.  Psychiatric:        Behavior: Behavior normal.      No results found for any visits on 03/20/22.    The 10-year ASCVD risk score (Arnett DK, et al., 2019) is: 21.9%     Assessment & Plan:   Problem List Items Addressed This Visit   None Visit Diagnoses     Nausea and vomiting, unspecified vomiting type    -  Primary   Relevant Orders   Ambulatory referral to Gastroenterology   Lipase   Salmonella/Shigella Cult, Campy EIA and Shiga Toxin reflex   C. difficile GDH and Toxin A/B   COMPLETE METABOLIC PANEL WITH GFR   Generalized abdominal pain       Relevant Orders   Lipase   Salmonella/Shigella Cult, Campy EIA and Shiga Toxin reflex   C. difficile GDH and Toxin A/B   COMPLETE METABOLIC PANEL WITH GFR      Nausea and vomiting-has been going on for 2 months she had made some dietary modifications and it was better but it still has not resolved and it still going on.  So we will check stool culture and check for C. difficile.  Will check lipase and liver enzymes.  I would also decrease her Trulicity down to 3 mg just to see if that helps even though she is already been on the 4.5 mg dose for at least 6  months.  Medical head and place a GI referral so they can help [place] better determine what is going on.  She did look a little bit dry on her blood work the other day she has been trying to really keep up with her fluids but I suspect it secondary to the diarrhea.  No follow-ups on file.    Beatrice Lecher, MD

## 2022-03-21 LAB — COMPLETE METABOLIC PANEL WITH GFR
AG Ratio: 1.4 (calc) (ref 1.0–2.5)
ALT: 28 U/L (ref 6–29)
AST: 30 U/L (ref 10–35)
Albumin: 4.3 g/dL (ref 3.6–5.1)
Alkaline phosphatase (APISO): 100 U/L (ref 37–153)
BUN/Creatinine Ratio: 28 (calc) — ABNORMAL HIGH (ref 6–22)
BUN: 31 mg/dL — ABNORMAL HIGH (ref 7–25)
CO2: 27 mmol/L (ref 20–32)
Calcium: 9.5 mg/dL (ref 8.6–10.4)
Chloride: 100 mmol/L (ref 98–110)
Creat: 1.11 mg/dL — ABNORMAL HIGH (ref 0.60–1.00)
Globulin: 3 g/dL (calc) (ref 1.9–3.7)
Glucose, Bld: 132 mg/dL — ABNORMAL HIGH (ref 65–99)
Potassium: 4.5 mmol/L (ref 3.5–5.3)
Sodium: 138 mmol/L (ref 135–146)
Total Bilirubin: 0.4 mg/dL (ref 0.2–1.2)
Total Protein: 7.3 g/dL (ref 6.1–8.1)
eGFR: 53 mL/min/{1.73_m2} — ABNORMAL LOW (ref >=60)

## 2022-03-21 LAB — LIPASE: Lipase: 28 U/L (ref 7–60)

## 2022-03-21 NOTE — Progress Notes (Signed)
Hi Natalie Wright, you still look really dry on your labs some to have you take your chlorthalidone every other day.  Lipase was normal no sign of pancreatitis.  No sign of liver inflammation.  Stool cultures are pending.

## 2022-03-27 ENCOUNTER — Other Ambulatory Visit: Payer: Self-pay | Admitting: Family Medicine

## 2022-03-27 DIAGNOSIS — E1149 Type 2 diabetes mellitus with other diabetic neurological complication: Secondary | ICD-10-CM

## 2022-03-27 DIAGNOSIS — I700 Atherosclerosis of aorta: Secondary | ICD-10-CM

## 2022-04-09 ENCOUNTER — Ambulatory Visit (INDEPENDENT_AMBULATORY_CARE_PROVIDER_SITE_OTHER): Payer: Medicare Other | Admitting: Family Medicine

## 2022-04-09 DIAGNOSIS — Z0000 Encounter for general adult medical examination without abnormal findings: Secondary | ICD-10-CM

## 2022-04-09 DIAGNOSIS — Z780 Asymptomatic menopausal state: Secondary | ICD-10-CM

## 2022-04-09 NOTE — Progress Notes (Signed)
MEDICARE ANNUAL WELLNESS VISIT  04/09/2022  Telephone Visit Disclaimer This Medicare AWV was conducted by telephone due to national recommendations for restrictions regarding the COVID-19 Pandemic (e.g. social distancing).  I verified, using two identifiers, that I am speaking with Alyson Locket or their authorized healthcare agent. I discussed the limitations, risks, security, and privacy concerns of performing an evaluation and management service by telephone and the potential availability of an in-person appointment in the future. The patient expressed understanding and agreed to proceed.  Location of Patient: Home Location of Provider (nurse):  In the office.  Subjective:    [place] Hanlon is a 71 y.o. female patient of Metheney, Rene Kocher, MD who had a Medicare Annual Wellness Visit today via telephone. Gracen is Retired and lives with their spouse. she has 2 children. she reports that she is socially active and does interact with friends/family regularly. she is minimally physically active and enjoys reading and crafts.  Patient Care Team: Hali Marry, MD as PCP - General     04/09/2022    3:59 PM 09/30/2020    1:19 PM 03/10/2019   11:10 AM  Advanced Directives  Does Patient Have a Medical Advance Directive? No No No  Would patient like information on creating a medical advance directive? No - Patient declined No - Patient declined No - Patient declined    Hospital Utilization Over the Past 12 Months: # of hospitalizations or ER visits: 0 # of surgeries: 0  Review of Systems    Patient reports that her overall health is unchanged compared to last year.  History obtained from chart review and the patient  Patient Reported Readings (BP, Pulse, CBG, Weight, etc) none  Pain Assessment Pain : No/denies pain     Current Medications & Allergies (verified) Allergies as of 04/09/2022       Reactions   Atorvastatin Other (See Comments)   Patient stated  muscle weakness Patient stated muscle weakness   Beclomethasone Other (See Comments)   unknown unknown   Cefaclor Hives   Pioglitazone Other (See Comments)   Mouth sores Mouth sores Mouth sores unknown REACTION: ulcers in the mouth   Pravastatin Other (See Comments)   Chest pain.  Chest pain.    Sulfa Antibiotics Hives   Other reaction(s): Agitation   Cephalexin Other (See Comments)   unknown   Lipitor [atorvastatin Calcium] Other (See Comments)   myalgias   Statins Other (See Comments)   Muscle pain Other reaction(s): Other (See Comments) Muscle pain        Medication List        Accurate as of April 09, 2022  4:11 PM. If you have any questions, ask your nurse or doctor.          acetaminophen 650 MG CR tablet Commonly known as: TYLENOL Take 650 mg by mouth every 8 (eight) hours as needed.   AMBULATORY NON FORMULARY MEDICATION Medication Name: custom fit knee high compression stocking with 15-20 mmHg pressure. Open toe preferred.   aspirin 325 MG tablet Take 325 mg by mouth daily.   cetirizine 10 MG tablet Commonly known as: ZYRTEC Take 10 mg by mouth daily.   chlorthalidone 25 MG tablet Commonly known as: HYGROTON Take 1 tablet (25 mg total) by mouth every other day.   clobetasol cream 0.05 % Commonly known as: TEMOVATE Apply 1 application topically 2 (two) times daily.   clopidogrel 75 MG tablet Commonly known as: PLAVIX Take 1 tablet (75 mg total) by  mouth daily.   diclofenac 75 MG EC tablet Commonly known as: VOLTAREN Take 1 tablet (75 mg total) by mouth 2 (two) times daily.   diltiazem 360 MG 24 hr capsule Commonly known as: CARDIZEM CD TAKE 1 CAPSULE(360 MG) BY MOUTH DAILY   DULoxetine 30 MG capsule Commonly known as: Cymbalta Take 1 capsule (30 mg total) by mouth daily. For musculoskeletal pain   Ferrous Sulfate Dried 200 (65 Fe) MG Tabs Take 1 tablet by mouth daily.   Fish Oil 1200 MG Caps Take 1 capsule by mouth daily.    fluorometholone 0.1 % ophthalmic suspension Commonly known as: FML SMARTSIG:In Eye(s)   ibuprofen 600 MG tablet Commonly known as: ADVIL   Insulin Pen Needle 30G X 8 MM Misc Commonly known as: NovoFine For use each time injection for insulin   Jardiance 25 MG Tabs tablet Generic drug: empagliflozin TAKE 1 TABLET DAILY   Lantus 100 UNIT/ML injection Generic drug: insulin glargine INJECT 35 UNITS UNDER THE SKIN DAILY What changed: See the new instructions.   latanoprost 0.005 % ophthalmic solution Commonly known as: XALATAN 1 drop.   lisinopril 5 MG tablet Commonly known as: ZESTRIL TAKE 1 TABLET DAILY   meclizine 25 MG tablet Commonly known as: ANTIVERT TAKE 1 TABLET THREE TIMES A DAY AS NEEDED FOR DIZZINESS   metFORMIN 1000 MG tablet Commonly known as: GLUCOPHAGE TAKE 1 TABLET TWICE A DAY WITH MEALS   Misc. Devices Misc Transfer of care to [facility].  Please provide service to her current CPAP machine for OSA at 14 cm. water pressure.  Send directly to [place] at [facility].   pantoprazole 40 MG tablet Commonly known as: PROTONIX Take 1 tablet (40 mg total) by mouth daily.   rosuvastatin 10 MG tablet Commonly known as: CRESTOR TAKE 1 TABLET ONCE A WEEK AT BEDTIME   sucralfate 1 GM/10ML suspension Commonly known as: Carafate Take 10 mLs (1 g total) by mouth 4 (four) times daily -  with meals and at bedtime.   triamcinolone cream 0.1 % Commonly known as: KENALOG Apply 1 Application topically 2 (two) times daily.   Trulicity 4.5 LF/8.1OF Sopn Generic drug: Dulaglutide Inject 4.5 mg as directed once a week.   Trulicity 3 BP/1.0CH Sopn Generic drug: Dulaglutide Inject 3 mg as directed once a week.        History (reviewed): Past Medical History:  Diagnosis Date   Allergy    to cats and dogs   Diabetes mellitus    Hiatal hernia    and delayed gastric emptying/ sees [facility] for recurrent diarrhea   History of cardiovascular disorder 12/10/2008    Qualifier: Diagnosis of  By: Madilyn Fireman MD, Catherine     Hypertension    OSA (obstructive sleep apnea)    CPAP 26mhg   Pedal edema    Spastic bladder    urology in [place]   TIA (transient ischemic attack)    hx of- sees Dr SElwin Mocha(Neurology)   Past Surgical History:  Procedure Laterality Date   CATARACT EXTRACTION  08/2021   vaginal skin tag removal     Family History  Problem Relation Age of Onset   Diabetes Mother    Hypertension Mother    Glaucoma Mother    Cancer Mother        bone cancer   Alcohol abuse Father    Emphysema Father    Other Father        CHF   Ovarian cancer Sister    Endometrial  cancer Sister        Endometrial stromal sarcoma   Social History   Socioeconomic History   Marital status: Married    Spouse name: Clifton James   Number of children: 2   Years of education: Masters   Highest education level: Master's degree (e.g., MA, MS, MEng, MEd, MSW, MBA)  Occupational History   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  Retired and works 1 day a week for her daughter just to be out and about. Enjoys reading and doing crafts in her free time.   Social Determinants of Health   Financial Resource Strain: Low Risk  (04/09/2022)   Overall Financial Resource Strain (CARDIA)    Difficulty of Paying Living Expenses: Not hard at all  Food Insecurity: No Food Insecurity (04/09/2022)   Hunger Vital Sign    Worried About Running Out of Food in the Last Year: Never true    Ran Out of Food in the Last Year: Never true  Transportation Needs: No Transportation Needs (04/09/2022)   PRAPARE - Hydrologist  (Medical): No    Lack of Transportation (Non-Medical): No  Physical Activity: Insufficiently Active (04/09/2022)   Exercise Vital Sign    Days of Exercise per Week: 3 days    Minutes of Exercise per Session: 10 min  Stress: No Stress Concern Present (04/09/2022)   [place]    Feeling of Stress : Not at all  Social Connections: Moderately Isolated (04/09/2022)   Social Connection and Isolation Panel [NHANES]    Frequency of Communication with Friends and Family: More than three times a week    Frequency of Social Gatherings with Friends and Family: More than three times a week    Attends Religious Services: Never    Marine scientist or Organizations: No    Attends Archivist Meetings: Never    Marital Status: Married    Activities of Daily Living    04/09/2022    4:00 PM  In your present state of health, do you have any difficulty performing the following activities:  Hearing? 0  Vision? 1  Comment had cataract surgery in right eye, still having some complications  Difficulty concentrating or making decisions? 0  Walking or climbing stairs? 1  Comment bilateral knee pain  Dressing or bathing? 0  Doing errands, shopping? 0  Preparing Food and eating ? Y  Comment due to knee pain, she is not able to stand for a long time to cook.  Using the Toilet? N  In the past six months, have you accidently leaked urine? Y  Do you have problems with loss of bowel control? Y  Comment hx of gastroperesis; when she has diarrhea she might leak a little  Managing your Medications? N  Managing your Finances? N  Housekeeping or managing your Housekeeping? N    Patient Education/ Literacy How often do you need to have someone help you when you read instructions, pamphlets, or other written materials from your doctor or pharmacy?:  1 - Never What is the last grade level you completed in school?: Masters  degree  Exercise Current Exercise Habits: Home exercise routine, Type of exercise: Other - see comments (recumbant bike), Time (Minutes): 10, Frequency (Times/Week): 3, Weekly Exercise (Minutes/Week): 30, Intensity: Moderate, Exercise limited by: orthopedic condition(s) (knee pain)  Diet Patient reports consuming 3 small meals a day and 2 small snack(s) a day Patient reports that her primary diet is: Regular Patient reports that she does have regular access to food.   Depression Screen    04/09/2022    3:59 PM 03/20/2022    1:30 PM 02/26/2022    1:36 PM 11/23/2021   11:37 AM 07/18/2021   11:33 AM 03/28/2021    1:31 PM 09/30/2020    1:16 PM  PHQ 2/9 Scores  PHQ - 2 Score 0 0 0 0 0 0 0     Fall Risk    04/09/2022    3:59 PM 03/20/2022    1:30 PM 11/23/2021   11:36 AM 07/18/2021   11:32 AM 03/28/2021    1:31 PM  [place] in the past year? 0 0 0 1 0  Number falls in past yr: 0 0 0 1 0  Injury with Fall? 0 0 0 1 0  Risk for fall due to : No Fall Risks No Fall Risks No Fall Risks Impaired mobility No Fall Risks  Follow up Falls evaluation completed Falls evaluation completed Falls prevention discussed Falls prevention discussed;Falls evaluation completed Falls prevention discussed;Falls evaluation completed     Objective:  Doranne Schmutz seemed alert and oriented and she participated appropriately during our telephone visit.  Blood Pressure Weight BMI  BP Readings from Last 3 Encounters:  03/20/22 (!) 126/37  02/26/22 123/81  01/12/22 (!) 145/79   Wt Readings from Last 3 Encounters:  03/20/22 (!) 307 lb (139.3 kg)  02/26/22 (!) 331 lb (150.1 kg)  01/12/22 (!) 322 lb (146.1 kg)   BMI Readings from Last 1 Encounters:  03/20/22 58.01 kg/m    *Unable to obtain current vital signs, weight, and BMI due to telephone visit type  Hearing/Vision  Lexey did not seem to have difficulty with hearing/understanding during the telephone conversation Reports that she has had a  formal eye exam by an eye care professional within the past year Reports that she has not had a formal hearing evaluation within the past year *Unable to fully assess hearing and vision during telephone visit type  Cognitive Function:    04/09/2022    4:06 PM 09/30/2020    1:21 PM 03/10/2019   11:15 AM  6CIT Screen  What Year? 0 points 0 points 0 points  What month? 0 points 0 points 0 points  What time? 0 points 0 points 0 points  Count back from 20 2 points 0 points 0 points  Months in reverse 0 points 0 points 0 points  Repeat phrase 0 points 0 points 0 points  Total Score 2 points 0 points 0 points   (Normal:0-7, Significant for Dysfunction: >8)  Normal Cognitive Function Screening: Yes   Immunization & Health Maintenance Record Immunization History  Administered Date(s) Administered   Fluad Quad(high Dose 65+) 05/15/2019, 05/05/2020, 03/28/2021   Influenza Split 06/14/2011, 07/04/2012   Influenza Whole 04/13/2009   Influenza, High Dose Seasonal PF 05/10/2016, 04/12/2017, 05/15/2018   Influenza,inj,Quad PF,6+ Mos 05/03/2014, 05/20/2015   PFIZER(Purple Top)SARS-COV-2 Vaccination 07/14/2019, 08/04/2019, 04/20/2020   PPD Test 03/23/2011, 01/09/2013, 05/03/2014, 02/10/2016   Pfizer Covid-19  Vaccine Bivalent Booster 39yr & up 04/13/2021   Pneumococcal Conjugate-13 11/19/2014   Pneumococcal Polysaccharide-23 05/13/2006, 01/04/2017   Td 04/25/2009   Tdap 10/31/2019   Zoster Recombinat (Shingrix) 09/22/2019, 11/22/2019   Zoster, Live 05/10/2016    Health Maintenance  Topic Date Due   COVID-19 Vaccine (5 - Pfizer series) 04/25/2022 (Originally 08/14/2021)   INFLUENZA VACCINE  09/30/2022 (Originally 01/30/2022)   DEXA SCAN  04/10/2023 (Originally 06/26/2021)   HEMOGLOBIN A1C  08/29/2022   OPHTHALMOLOGY EXAM  10/20/2022   Fecal DNA (Cologuard)  12/11/2022   Diabetic kidney evaluation - Urine ACR  02/27/2023   FOOT EXAM  02/27/2023   Diabetic kidney evaluation - GFR measurement   03/21/2023   MAMMOGRAM  05/19/2023   TETANUS/TDAP  10/30/2029   Pneumonia Vaccine 71 Years old  Completed   Hepatitis C Screening  Completed   Zoster Vaccines- Shingrix  Completed   HPV VACCINES  Aged Out   COLONOSCOPY (Pts 45-460yrInsurance coverage will need to be confirmed)  Discontinued       Assessment  This is a routine wellness examination for [employer] Health Maintenance: Due or Overdue There are no preventive care reminders to display for this patient.   PrMildreth Reekoes not need a referral for Community Assistance: Care Management:   no Social Work:    no Prescription Assistance:  no Nutrition/Diabetes Education:  no   Plan:  Personalized Goals  Goals Addressed               This Visit's Progress     Patient Stated (pt-stated)        Patient would like to loose weight down to 200lbs to be eligible for knee replacement.       Personalized Health Maintenance & Screening Recommendations  Influenza vaccine Screening mammography Bone densitometry screening  Lung Cancer Screening Recommended: no (Low Dose CT Chest recommended if Age 71-80ears, 30 pack-year currently smoking OR have quit w/in past 15 years) Hepatitis C Screening recommended: no HIV Screening recommended: no  Advanced Directives: Written information was not prepared per patient's request.  Referrals & Orders Orders Placed This Encounter  Procedures   DEBruce  Follow-up Plan Follow-up with MeHali MarryMD as planned Bone density scan referral has been sent. Medicare wellness visit in one year. Patient will access AVS on my chart.   I have personally reviewed and noted the following in the patient's chart:   Medical and social history Use of alcohol, tobacco or illicit drugs  Current medications and supplements Functional ability and status Nutritional status Physical activity Advanced directives List of other physicians Hospitalizations,  surgeries, and ER visits in previous 12 months Vitals Screenings to include cognitive, depression, and falls Referrals and appointments  In addition, I have reviewed and discussed with PrAlyson Locketertain preventive protocols, quality metrics, and best practice recommendations. A written personalized care plan for preventive services as well as general preventive health recommendations is available and can be mailed to the patient at her request.      BaTinnie GensRN BSN  04/09/2022

## 2022-04-09 NOTE — Patient Instructions (Signed)
[place] Maintenance Summary and Written Plan of Care  [name] ,  Thank you for allowing me to perform your Medicare Annual Wellness Visit and for your ongoing commitment to your health.   Health Maintenance & Immunization History Health Maintenance  Topic Date Due   COVID-19 Vaccine (5 - Pfizer series) 04/25/2022 (Originally 08/14/2021)   INFLUENZA VACCINE  09/30/2022 (Originally 01/30/2022)   DEXA SCAN  04/10/2023 (Originally 06/26/2021)   HEMOGLOBIN A1C  08/29/2022   OPHTHALMOLOGY EXAM  10/20/2022   Fecal DNA (Cologuard)  12/11/2022   Diabetic kidney evaluation - Urine ACR  02/27/2023   FOOT EXAM  02/27/2023   Diabetic kidney evaluation - GFR measurement  03/21/2023   MAMMOGRAM  05/19/2023   TETANUS/TDAP  10/30/2029   Pneumonia Vaccine 47+ Years old  Completed   Hepatitis C Screening  Completed   Zoster Vaccines- Shingrix  Completed   HPV VACCINES  Aged Out   COLONOSCOPY (Pts 45-35yr Insurance coverage will need to be confirmed)  Discontinued   Immunization History  Administered Date(s) Administered   Fluad Quad(high Dose 65+) 05/15/2019, 05/05/2020, 03/28/2021   Influenza Split 06/14/2011, 07/04/2012   Influenza Whole 04/13/2009   Influenza, High Dose Seasonal PF 05/10/2016, 04/12/2017, 05/15/2018   Influenza,inj,Quad PF,6+ Mos 05/03/2014, 05/20/2015   PFIZER(Purple Top)SARS-COV-2 Vaccination 07/14/2019, 08/04/2019, 04/20/2020   PPD Test 03/23/2011, 01/09/2013, 05/03/2014, 02/10/2016   Pfizer Covid-19 Vaccine Bivalent Booster 1105yr& up 04/13/2021   Pneumococcal Conjugate-13 11/19/2014   Pneumococcal Polysaccharide-23 05/13/2006, 01/04/2017   Td 04/25/2009   Tdap 10/31/2019   Zoster Recombinat (Shingrix) 09/22/2019, 11/22/2019   Zoster, Live 05/10/2016    These are the patient goals that we discussed:  Goals Addressed               This Visit's Progress     Patient Stated (pt-stated)        Patient would like to loose weight  down to 200lbs to be eligible for knee replacement.         This is a list of Health Maintenance Items that are overdue or due now: Influenza vaccine Screening mammography Bone densitometry screening   Orders/Referrals Placed Today: Orders Placed This Encounter  Procedures   DEXAScan    Standing Status:   Future    Standing Expiration Date:   04/10/2023    Scheduling Instructions:     Please call patient to schedule.    Order Specific Question:   Reason for exam:    Answer:   post menopausal    Order Specific Question:   Preferred imaging location?    Answer:   [facility]  (Contact our referral department at [phone] you have not spoken with someone about your referral appointment within the next 5 days)    Follow-up Plan Follow-up with MeHali MarryMD as planned Bone density scan referral has been sent. Medicare wellness visit in one year. Patient will access AVS on my chart.      Health Maintenance, Female Adopting a healthy lifestyle and getting preventive care are important in promoting health and wellness. Ask your health care provider about: The right schedule for you to have regular tests and exams. Things you can do on your own to prevent diseases and keep yourself healthy. What should I know about diet, weight, and exercise? Eat a healthy diet  Eat a diet that includes plenty of vegetables, fruits, low-fat dairy products, and lean protein. Do not eat a lot of foods  that are high in solid fats, added sugars, or sodium. Maintain a healthy weight Body mass index (BMI) is used to identify weight problems. It estimates body fat based on height and weight. Your health care provider can help determine your BMI and help you achieve or maintain a healthy weight. Get regular exercise Get regular exercise. This is one of the most important things you can do for your health. Most adults should: Exercise for at least 150 minutes each week.  The exercise should increase your heart rate and make you sweat (moderate-intensity exercise). Do strengthening exercises at least twice a week. This is in addition to the moderate-intensity exercise. Spend less time sitting. Even light physical activity can be beneficial. Watch cholesterol and blood lipids Have your blood tested for lipids and cholesterol at 71 years of age, then have this test every 5 years. Have your cholesterol levels checked more often if: Your lipid or cholesterol levels are high. You are older than 71 years of age. You are at high risk for heart disease. What should I know about cancer screening? Depending on your health history and family history, you may need to have cancer screening at various ages. This may include screening for: Breast cancer. Cervical cancer. Colorectal cancer. Skin cancer. Lung cancer. What should I know about heart disease, diabetes, and high blood pressure? Blood pressure and heart disease High blood pressure causes heart disease and increases the risk of stroke. This is more likely to develop in people who have high blood pressure readings or are overweight. Have your blood pressure checked: Every 3-5 years if you are 19-52 years of age. Every year if you are 26 years old or older. Diabetes Have regular diabetes screenings. This checks your fasting blood sugar level. Have the screening done: Once every three years after age 22 if you are at a normal weight and have a low risk for diabetes. More often and at a younger age if you are overweight or have a high risk for diabetes. What should I know about preventing infection? Hepatitis B If you have a higher risk for hepatitis B, you should be screened for this virus. Talk with your health care provider to find out if you are at risk for hepatitis B infection. Hepatitis C Testing is recommended for: Everyone born from 18 through 1965. Anyone with known risk factors for hepatitis  C. Sexually transmitted infections (STIs) Get screened for STIs, including gonorrhea and chlamydia, if: You are sexually active and are younger than 71 years of age. You are older than 71 years of age and your health care provider tells you that you are at risk for this type of infection. Your sexual activity has changed since you were last screened, and you are at increased risk for chlamydia or gonorrhea. Ask your health care provider if you are at risk. Ask your health care provider about whether you are at high risk for HIV. Your health care provider may recommend a prescription medicine to help prevent HIV infection. If you choose to take medicine to prevent HIV, you should first get tested for HIV. You should then be tested every 3 months for as long as you are taking the medicine. Pregnancy If you are about to stop having your period (premenopausal) and you may become pregnant, seek counseling before you get pregnant. Take 400 to 800 micrograms (mcg) of folic acid every day if you become pregnant. Ask for birth control (contraception) if you want to prevent pregnancy. Osteoporosis and  menopause Osteoporosis is a disease in which the bones lose minerals and strength with aging. This can result in bone fractures. If you are 40 years old or older, or if you are at risk for osteoporosis and fractures, ask your health care provider if you should: Be screened for bone loss. Take a calcium or vitamin D supplement to lower your risk of fractures. Be given hormone replacement therapy (HRT) to treat symptoms of menopause. Follow these instructions at home: Alcohol use Do not drink alcohol if: Your health care provider tells you not to drink. You are pregnant, may be pregnant, or are planning to become pregnant. If you drink alcohol: Limit how much you have to: 0-1 drink a day. Know how much alcohol is in your drink. In the [place] one drink equals one 12 oz bottle of beer (355 mL), one 5 oz glass  of wine (148 mL), or one 1 oz glass of hard liquor (44 mL). Lifestyle Do not use any products that contain nicotine or tobacco. These products include cigarettes, chewing tobacco, and vaping devices, such as e-cigarettes. If you need help quitting, ask your health care provider. Do not use street drugs. Do not share needles. Ask your health care provider for help if you need support or information about quitting drugs. General instructions Schedule regular health, dental, and eye exams. Stay current with your vaccines. Tell your health care provider if: You often feel depressed. You have ever been abused or do not feel safe at home. Summary Adopting a healthy lifestyle and getting preventive care are important in promoting health and wellness. Follow your health care provider's instructions about healthy diet, exercising, and getting tested or screened for diseases. Follow your health care provider's instructions on monitoring your cholesterol and blood pressure. This information is not intended to replace advice given to you by your health care provider. Make sure you discuss any questions you have with your health care provider. Document Revised: 11/07/2020 Document Reviewed: 11/07/2020 Elsevier Patient Education  [place].

## 2022-04-16 DIAGNOSIS — H35351 Cystoid macular degeneration, right eye: Secondary | ICD-10-CM | POA: Diagnosis not present

## 2022-04-16 DIAGNOSIS — H20021 Recurrent acute iridocyclitis, right eye: Secondary | ICD-10-CM | POA: Diagnosis not present

## 2022-04-19 ENCOUNTER — Encounter: Payer: Self-pay | Admitting: Family Medicine

## 2022-04-19 DIAGNOSIS — R5383 Other fatigue: Secondary | ICD-10-CM | POA: Diagnosis not present

## 2022-04-19 DIAGNOSIS — R112 Nausea with vomiting, unspecified: Secondary | ICD-10-CM | POA: Diagnosis not present

## 2022-04-19 DIAGNOSIS — R1030 Lower abdominal pain, unspecified: Secondary | ICD-10-CM | POA: Diagnosis not present

## 2022-04-19 DIAGNOSIS — K529 Noninfective gastroenteritis and colitis, unspecified: Secondary | ICD-10-CM | POA: Diagnosis not present

## 2022-04-19 DIAGNOSIS — R109 Unspecified abdominal pain: Secondary | ICD-10-CM | POA: Diagnosis not present

## 2022-04-23 DIAGNOSIS — H59031 Cystoid macular edema following cataract surgery, right eye: Secondary | ICD-10-CM | POA: Diagnosis not present

## 2022-04-23 DIAGNOSIS — H43811 Vitreous degeneration, right eye: Secondary | ICD-10-CM | POA: Diagnosis not present

## 2022-04-23 DIAGNOSIS — H2512 Age-related nuclear cataract, left eye: Secondary | ICD-10-CM | POA: Diagnosis not present

## 2022-04-23 DIAGNOSIS — E113213 Type 2 diabetes mellitus with mild nonproliferative diabetic retinopathy with macular edema, bilateral: Secondary | ICD-10-CM | POA: Diagnosis not present

## 2022-04-23 LAB — HM DIABETES EYE EXAM

## 2022-04-26 DIAGNOSIS — R112 Nausea with vomiting, unspecified: Secondary | ICD-10-CM | POA: Diagnosis not present

## 2022-04-26 DIAGNOSIS — K529 Noninfective gastroenteritis and colitis, unspecified: Secondary | ICD-10-CM | POA: Diagnosis not present

## 2022-04-26 DIAGNOSIS — R109 Unspecified abdominal pain: Secondary | ICD-10-CM | POA: Diagnosis not present

## 2022-05-08 ENCOUNTER — Ambulatory Visit (INDEPENDENT_AMBULATORY_CARE_PROVIDER_SITE_OTHER): Payer: Medicare Other | Admitting: Podiatry

## 2022-05-08 DIAGNOSIS — B351 Tinea unguium: Secondary | ICD-10-CM | POA: Diagnosis not present

## 2022-05-08 DIAGNOSIS — Z7901 Long term (current) use of anticoagulants: Secondary | ICD-10-CM | POA: Diagnosis not present

## 2022-05-08 DIAGNOSIS — M79674 Pain in right toe(s): Secondary | ICD-10-CM | POA: Diagnosis not present

## 2022-05-08 DIAGNOSIS — M79675 Pain in left toe(s): Secondary | ICD-10-CM

## 2022-05-08 DIAGNOSIS — E1149 Type 2 diabetes mellitus with other diabetic neurological complication: Secondary | ICD-10-CM | POA: Diagnosis not present

## 2022-05-08 DIAGNOSIS — L84 Corns and callosities: Secondary | ICD-10-CM | POA: Diagnosis not present

## 2022-05-10 NOTE — Progress Notes (Signed)
Subjective: No chief complaint on file.   71 y.o. returns the office today for painful, elongated, thickened toenails which they cannot trim herself.  She denies any open sores.  She also has calluses to her feet.  No open lesions.  PCP: Hali Marry, MD Last seen April 09, 2022 A1c: 6.5 on February 26, 2022  Objective: AAO 3, NAD DP/PT pulses palpable, CRT less than 3 seconds Protective sensation decreased with Simms Weinstein monofilament Nails hypertrophic, dystrophic, elongated, brittle, discolored 10. There is tenderness overlying the nails 1-5 bilaterally. There is no surrounding erythema or drainage along the nail sites. Hyperkeratotic lesion right foot submetatarsal 5, metatarsal base as well as the heel.  No underlying ulceration drainage or any signs of infection noted today. Decrease in arch upon weightbearing.  No open lesions or other pre-ulcerative lesions are identified. No pain with calf compression, swelling, warmth, erythema.  Assessment: Patient presents with symptomatic onychomycosis, hyperkeratotic lesions  Plan: -Treatment options including alternatives, risks, complications were discussed -Nails sharply debrided 10 without complication/bleeding. -Hyperkeratotic lesion sharply debrided x 4 without any complications or bleeding.  Recommend moisturizer daily.  -Continue offloading inserts. -Discussed daily foot inspection. If there are any changes, to call the office immediately.  -Follow-up in 3 months or sooner if any problems are to arise. In the meantime, encouraged to call the office with any questions, concerns, changes symptoms.  Celesta Gentile, DPM

## 2022-05-16 ENCOUNTER — Other Ambulatory Visit: Payer: Medicare Other

## 2022-05-23 ENCOUNTER — Other Ambulatory Visit: Payer: Medicare Other

## 2022-05-25 ENCOUNTER — Other Ambulatory Visit: Payer: Self-pay | Admitting: Family Medicine

## 2022-05-28 DIAGNOSIS — H59031 Cystoid macular edema following cataract surgery, right eye: Secondary | ICD-10-CM | POA: Diagnosis not present

## 2022-05-28 DIAGNOSIS — H2512 Age-related nuclear cataract, left eye: Secondary | ICD-10-CM | POA: Diagnosis not present

## 2022-05-28 DIAGNOSIS — E113213 Type 2 diabetes mellitus with mild nonproliferative diabetic retinopathy with macular edema, bilateral: Secondary | ICD-10-CM | POA: Diagnosis not present

## 2022-05-28 DIAGNOSIS — H43811 Vitreous degeneration, right eye: Secondary | ICD-10-CM | POA: Diagnosis not present

## 2022-05-29 ENCOUNTER — Encounter: Payer: Self-pay | Admitting: Family Medicine

## 2022-05-29 ENCOUNTER — Ambulatory Visit (INDEPENDENT_AMBULATORY_CARE_PROVIDER_SITE_OTHER): Payer: Medicare Other | Admitting: Family Medicine

## 2022-05-29 VITALS — BP 141/44 | HR 60 | Ht 61.0 in | Wt 300.0 lb

## 2022-05-29 DIAGNOSIS — I10 Essential (primary) hypertension: Secondary | ICD-10-CM | POA: Diagnosis not present

## 2022-05-29 DIAGNOSIS — E785 Hyperlipidemia, unspecified: Secondary | ICD-10-CM

## 2022-05-29 DIAGNOSIS — E1149 Type 2 diabetes mellitus with other diabetic neurological complication: Secondary | ICD-10-CM

## 2022-05-29 DIAGNOSIS — Z23 Encounter for immunization: Secondary | ICD-10-CM

## 2022-05-29 DIAGNOSIS — F4321 Adjustment disorder with depressed mood: Secondary | ICD-10-CM | POA: Diagnosis not present

## 2022-05-29 DIAGNOSIS — M25552 Pain in left hip: Secondary | ICD-10-CM

## 2022-05-29 DIAGNOSIS — M25551 Pain in right hip: Secondary | ICD-10-CM | POA: Diagnosis not present

## 2022-05-29 LAB — POCT GLYCOSYLATED HEMOGLOBIN (HGB A1C): Hemoglobin A1C: 7.6 % — AB (ref 4.0–5.6)

## 2022-05-29 NOTE — Progress Notes (Signed)
Established Patient Office Visit  Subjective   Patient ID: Anvika Gashi, female    DOB: 1950/07/18  Age: 71 y.o. MRN: [id number]  Chief Complaint  Patient presents with   Hypertension   Diabetes    HPI Hypertension- Pt denies chest pain, SOB, dizziness, or heart palpitations.  Taking meds as directed w/o problems.  Denies medication side effects.    Diabetes - no hypoglycemic events. No wounds or sores that are not healing well. No increased thirst or urination. Checking glucose at home. Taking medications as prescribed without any side effects. She went back down to '3mg'$  on Trulicity.    Husband passed suddenly about 2 weeks ago.  He had been in the ICU for most 6 weeks.  She is also been having bilateral hip pain.  Its been going on for quite some time.  It is worse when she is up and moving around.  Sometimes when she goes to the grocery store she feels like she can barely make it back to the car.  Sometimes it radiates downward.  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  She has been trying to move and exercise more but feels like it is really not helping with her pain.  She does take diclofenac 75 mg and says its not helpful she is also taking duloxetine 30 mg and has not noticed improvement with that either.    ROS    Objective:     BP (!) 141/44   Pulse 60   Ht '5\' 1"'$  (1.549 m)   Wt 300 lb (136.1 kg)   SpO2 99%   BMI 56.68 kg/m    Physical Exam Vitals and nursing note reviewed.  Constitutional:      Appearance: She is well-developed.  HENT:     Head: Normocephalic and atraumatic.  Cardiovascular:     Rate and Rhythm: Normal rate and regular rhythm.     Heart sounds: Normal heart sounds.  Pulmonary:     Effort: Pulmonary effort is normal.     Breath sounds: Normal breath sounds.  Skin:    General: Skin is warm and dry.  Neurological:     Mental Status: She is alert and oriented to person, place, and time.   Psychiatric:        Behavior: Behavior normal.      Results for orders placed or performed in visit on 05/29/22  POCT glycosylated hemoglobin (Hb A1C)  Result Value Ref Range   Hemoglobin A1C 7.6 (A) 4.0 - 5.6 %   HbA1c POC (<> result, manual entry)     HbA1c, POC (prediabetic range)     HbA1c, POC (controlled diabetic range)        The 10-year ASCVD risk score (Arnett DK, et al., 2019) is: 26.3%    Assessment & Plan:   Problem List Items Addressed This Visit       Cardiovascular and Mediastinum   Essential hypertension, benign    To recheck blood pressure again today.  Typically her blood pressure is on the lower end.  She does have a follow-up with cardiology in a couple weeks.      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  I think with the recent stress of losing her husband and him being ill in  the hospital from [place] 6 weeks she has not been able to focus as much on her diet.  Follow back up in 3 to 4 months.  Continue with Trulicity and Jardiance.      Relevant Orders   POCT glycosylated hemoglobin (Hb A1C) (Completed)   Lipid panel     Other   Hyperlipidemia    Due to recheck lipid levels.      Relevant Orders   Lipid panel   Bilateral hip pain    Bilateral hip pain its been going on for quite some time its not consistent with bursitis.  We will get x-rays for further work-up today.  It sounds like she has been significant degeneration in her knees so it would not be surprising if she has some moderate arthritis in her hips as well.  She has been trying to get more active.  Formal PT could be helpful as well.  We will call with results once available.      Relevant Orders   DG Hip Unilat W OR W/O Pelvis 2-3 Views Right   DG Hip Unilat W OR W/O Pelvis 2-3 Views Left   Other Visit Diagnoses     Need for immunization against influenza        Relevant Orders   Flu Vaccine QUAD High Dose(Fluad) (Completed)   Grief           Grief -she does have some good support with friends.  Did encourage her to reach out if at any point she needs any other assistance or grief counseling.  Return in about 3 months (around 08/29/2022) for Diabetes follow-up.    Beatrice Lecher, MD

## 2022-05-29 NOTE — Assessment & Plan Note (Signed)
Due to recheck lipid levels. 

## 2022-05-29 NOTE — Assessment & Plan Note (Signed)
Bilateral hip pain its been going on for quite some time its not consistent with bursitis.  We will get x-rays for further work-up today.  It sounds like she has been significant degeneration in her knees so it would not be surprising if she has some moderate arthritis in her hips as well.  She has been trying to get more active.  Formal PT could be helpful as well.  We will call with results once available.

## 2022-05-29 NOTE — Assessment & Plan Note (Signed)
A1c is up to 7.6, it was 6.5 last time.  I think with the recent stress of losing her husband and him being ill in the hospital from [place] 6 weeks she has not been able to focus as much on her diet.  Follow back up in 3 to 4 months.  Continue with Trulicity and Jardiance.

## 2022-05-29 NOTE — Assessment & Plan Note (Signed)
To recheck blood pressure again today.  Typically her blood pressure is on the lower end.  She does have a follow-up with cardiology in a couple weeks.

## 2022-05-31 DIAGNOSIS — R109 Unspecified abdominal pain: Secondary | ICD-10-CM | POA: Diagnosis not present

## 2022-05-31 DIAGNOSIS — R112 Nausea with vomiting, unspecified: Secondary | ICD-10-CM | POA: Diagnosis not present

## 2022-05-31 DIAGNOSIS — K3189 Other diseases of stomach and duodenum: Secondary | ICD-10-CM | POA: Diagnosis not present

## 2022-05-31 DIAGNOSIS — K8689 Other specified diseases of pancreas: Secondary | ICD-10-CM | POA: Diagnosis not present

## 2022-05-31 DIAGNOSIS — K529 Noninfective gastroenteritis and colitis, unspecified: Secondary | ICD-10-CM | POA: Diagnosis not present

## 2022-05-31 DIAGNOSIS — R5383 Other fatigue: Secondary | ICD-10-CM | POA: Diagnosis not present

## 2022-05-31 DIAGNOSIS — K449 Diaphragmatic hernia without obstruction or gangrene: Secondary | ICD-10-CM | POA: Diagnosis not present

## 2022-05-31 DIAGNOSIS — K5730 Diverticulosis of large intestine without perforation or abscess without bleeding: Secondary | ICD-10-CM | POA: Diagnosis not present

## 2022-06-01 ENCOUNTER — Other Ambulatory Visit: Payer: Self-pay | Admitting: Family Medicine

## 2022-06-01 ENCOUNTER — Ambulatory Visit (INDEPENDENT_AMBULATORY_CARE_PROVIDER_SITE_OTHER): Payer: Medicare Other

## 2022-06-01 DIAGNOSIS — M1712 Unilateral primary osteoarthritis, left knee: Secondary | ICD-10-CM | POA: Diagnosis not present

## 2022-06-01 DIAGNOSIS — M1711 Unilateral primary osteoarthritis, right knee: Secondary | ICD-10-CM | POA: Diagnosis not present

## 2022-06-01 DIAGNOSIS — M25562 Pain in left knee: Secondary | ICD-10-CM

## 2022-06-01 DIAGNOSIS — E785 Hyperlipidemia, unspecified: Secondary | ICD-10-CM

## 2022-06-01 DIAGNOSIS — F4321 Adjustment disorder with depressed mood: Secondary | ICD-10-CM

## 2022-06-01 DIAGNOSIS — M25561 Pain in right knee: Secondary | ICD-10-CM | POA: Diagnosis not present

## 2022-06-01 DIAGNOSIS — G8929 Other chronic pain: Secondary | ICD-10-CM

## 2022-06-01 DIAGNOSIS — M25551 Pain in right hip: Secondary | ICD-10-CM

## 2022-06-01 DIAGNOSIS — M160 Bilateral primary osteoarthritis of hip: Secondary | ICD-10-CM | POA: Diagnosis not present

## 2022-06-01 DIAGNOSIS — E1149 Type 2 diabetes mellitus with other diabetic neurological complication: Secondary | ICD-10-CM

## 2022-06-01 DIAGNOSIS — I10 Essential (primary) hypertension: Secondary | ICD-10-CM

## 2022-06-01 DIAGNOSIS — M25552 Pain in left hip: Secondary | ICD-10-CM | POA: Diagnosis not present

## 2022-06-01 DIAGNOSIS — Z0000 Encounter for general adult medical examination without abnormal findings: Secondary | ICD-10-CM

## 2022-06-01 DIAGNOSIS — Z780 Asymptomatic menopausal state: Secondary | ICD-10-CM | POA: Diagnosis not present

## 2022-06-01 DIAGNOSIS — Z23 Encounter for immunization: Secondary | ICD-10-CM

## 2022-06-04 NOTE — Progress Notes (Signed)
Hi Millette, the right knee also shows some arthritis in all 3 compartments.  Again I think you would benefit from formal physical therapy.

## 2022-06-04 NOTE — Progress Notes (Signed)
Your bone density test shows a T score of need of 1.0.  This is actually considered to be in the normal range.  Recommend repeat bone density testing in 5 years.  You to work on weightbearing exercise and getting an adequate calcium and vitamin D

## 2022-06-04 NOTE — Progress Notes (Signed)
Hi Natalie Wright, you do have arthritis in both hips.  There is some joint space narrowing and some bone spurs.  They also saw a questionable area that they said could be a fracture but they were not sure.  I think without a specific trauma or injury that that would be unlikely.  I would like to get you in with one of our sports medicine doctors orthopedist for your hips.  If you are okay with that then please let me know.

## 2022-06-04 NOTE — Progress Notes (Signed)
Hi Defne, x-ray of the left knee shows no acute changes but there is some arthritis in all 3 compartments of the knee.  I think he would benefit greatly from formal physical therapy for your knees.

## 2022-06-06 ENCOUNTER — Telehealth: Payer: Self-pay | Admitting: Family Medicine

## 2022-06-06 NOTE — Telephone Encounter (Signed)
Please call digestive health specialist and let them know that it is okay for her to hold her Plavix 4 days prior to her endoscopy/colonoscopy.  If they need [place] to send a formal letter than that is perfectly fine.

## 2022-06-07 ENCOUNTER — Encounter: Payer: Self-pay | Admitting: Family Medicine

## 2022-06-07 DIAGNOSIS — I471 Supraventricular tachycardia, unspecified: Secondary | ICD-10-CM | POA: Diagnosis not present

## 2022-06-07 NOTE — Telephone Encounter (Signed)
[facility] states the need a formal letter. They are faxing the paper now.

## 2022-06-07 NOTE — Telephone Encounter (Signed)
Faxed form back to [facility].

## 2022-06-13 ENCOUNTER — Encounter: Payer: Self-pay | Admitting: Family Medicine

## 2022-06-13 MED ORDER — FLUCONAZOLE 150 MG PO TABS: 150.0000 mg | ORAL_TABLET | Freq: Once | ORAL | 1 refills | Status: AC

## 2022-06-13 NOTE — Telephone Encounter (Signed)
L.O.V: 05/29/22  N.O.V: 07/03/22  L.R.F: Diflucan 11/16/20 (not active for reorder)

## 2022-06-13 NOTE — Telephone Encounter (Signed)
Meds ordered this encounter  Medications   fluconazole (DIFLUCAN) 150 MG tablet    Sig: Take 1 tablet (150 mg total) by mouth once for 1 dose.    Dispense:  1 tablet    Refill:  1

## 2022-06-22 ENCOUNTER — Ambulatory Visit
Admission: RE | Admit: 2022-06-22 | Discharge: 2022-06-22 | Disposition: A | Discharge status: Home / Self Care | Payer: Medicare Other | Source: Ambulatory Visit | Attending: Family Medicine | Admitting: Family Medicine

## 2022-06-22 ENCOUNTER — Other Ambulatory Visit: Payer: Self-pay | Admitting: Family Medicine

## 2022-06-22 DIAGNOSIS — N6012 Diffuse cystic mastopathy of left breast: Secondary | ICD-10-CM | POA: Diagnosis not present

## 2022-06-22 DIAGNOSIS — N6320 Unspecified lump in the left breast, unspecified quadrant: Secondary | ICD-10-CM

## 2022-06-22 DIAGNOSIS — R928 Other abnormal and inconclusive findings on diagnostic imaging of breast: Secondary | ICD-10-CM | POA: Diagnosis not present

## 2022-06-23 NOTE — Progress Notes (Signed)
Please call patient. Normal mammogram.  Repeat in 1 year.  

## 2022-06-28 ENCOUNTER — Ambulatory Visit: Payer: Medicare Other | Admitting: Sports Medicine

## 2022-07-03 ENCOUNTER — Ambulatory Visit (INDEPENDENT_AMBULATORY_CARE_PROVIDER_SITE_OTHER): Payer: Medicare Other | Admitting: Sports Medicine

## 2022-07-03 ENCOUNTER — Other Ambulatory Visit: Payer: Self-pay | Admitting: Family Medicine

## 2022-07-03 ENCOUNTER — Ambulatory Visit (INDEPENDENT_AMBULATORY_CARE_PROVIDER_SITE_OTHER): Payer: Medicare Other

## 2022-07-03 DIAGNOSIS — M545 Low back pain, unspecified: Secondary | ICD-10-CM | POA: Diagnosis not present

## 2022-07-03 DIAGNOSIS — M47816 Spondylosis without myelopathy or radiculopathy, lumbar region: Secondary | ICD-10-CM | POA: Insufficient documentation

## 2022-07-03 DIAGNOSIS — M2550 Pain in unspecified joint: Secondary | ICD-10-CM

## 2022-07-03 DIAGNOSIS — M4316 Spondylolisthesis, lumbar region: Secondary | ICD-10-CM | POA: Diagnosis not present

## 2022-07-03 MED ORDER — PREDNISONE 50 MG PO TABS: ORAL_TABLET | ORAL | 0 refills | Status: DC

## 2022-07-03 MED ORDER — MELOXICAM 15 MG PO TABS: ORAL_TABLET | ORAL | 3 refills | Status: DC

## 2022-07-03 NOTE — Assessment & Plan Note (Signed)
Very pleasant 72 year old female, she has morbid obesity, she is fairly debilitated. She has had a long history of pain in both hips, buttocks, low back with occasional radicular symptoms, she did have some x-rays done of her hips and knees which showed osteoarthritis. I think the majority of her pain is coming from her back so we will do 5 days of prednisone, followed by meloxicam, lumbar spine x-rays, home conditioning for her hips, back and knees, I have really encouraged her to take drastic measures to lose weight including bariatric surgery, she went through the process several years ago but never went forward with the surgery. Return to see me in 6 weeks.

## 2022-07-03 NOTE — Progress Notes (Signed)
    Procedures performed today:    None.  Independent interpretation of notes and tests performed by another provider:   None.  Brief History, Exam, Impression, and Recommendations:    Polyarthralgia Very pleasant 72 year old female, she has morbid obesity, she is fairly debilitated. She has had a long history of pain in both hips, buttocks, low back with occasional radicular symptoms, she did have some x-rays done of her hips and knees which showed osteoarthritis. I think the majority of her pain is coming from her back so we will do 5 days of prednisone, followed by meloxicam, lumbar spine x-rays, home conditioning for her hips, back and knees, I have really encouraged her to take drastic measures to lose weight including bariatric surgery, she went through the process several years ago but never went forward with the surgery. Return to see me in 6 weeks.    ____________________________________________ Gwen Her. Dianah Field, M.D., ABFM., CAQSM., AME. Primary Care and Sports Medicine [facility]  Adjunct Professor of [employer]  Risk manager

## 2022-07-05 LAB — URIC ACID: Uric Acid, Serum: 4.3 mg/dL (ref 2.5–7.0)

## 2022-07-05 LAB — CBC WITH DIFFERENTIAL/PLATELET
Absolute Monocytes: 444 cells/uL (ref 200–950)
Basophils Absolute: 30 cells/uL (ref 0–200)
Basophils Relative: 0.3 %
Eosinophils Absolute: 111 cells/uL (ref 15–500)
Eosinophils Relative: 1.1 %
HCT: 43.6 % (ref 35.0–45.0)
Hemoglobin: 14.5 g/dL (ref 11.7–15.5)
Lymphs Abs: 1970 cells/uL (ref 850–3900)
MCH: 29.3 pg (ref 27.0–33.0)
MCHC: 33.3 g/dL (ref 32.0–36.0)
MCV: 88.1 fL (ref 80.0–100.0)
MPV: 9.7 fL (ref 7.5–12.5)
Monocytes Relative: 4.4 %
Neutro Abs: 7545 cells/uL (ref 1500–7800)
Neutrophils Relative %: 74.7 %
Platelets: 369 10*3/uL (ref 140–400)
RBC: 4.95 10*6/uL (ref 3.80–5.10)
RDW: 12.6 % (ref 11.0–15.0)
Total Lymphocyte: 19.5 %
WBC: 10.1 10*3/uL (ref 3.8–10.8)

## 2022-07-05 LAB — RHEUMATOID ARTHRITIS DIAGNOSTIC PANEL, COMPREHENSIVE
Cyclic Citrullin Peptide Ab: <16 Units (ref <20)
Rheumatoid Factor (IgA): <5 U (ref <=6)
Rheumatoid Factor (IgG): <5 U (ref <=6)
Rheumatoid Factor (IgM): <5 U (ref <=6)
SSA (Ro) (ENA) Antibody, IgG: <1 AI
SSB (La) (ENA) Antibody, IgG: <1 AI

## 2022-07-05 LAB — COMPREHENSIVE METABOLIC PANEL
AG Ratio: 1.6 (calc) (ref 1.0–2.5)
ALT: 14 U/L (ref 6–29)
AST: 17 U/L (ref 10–35)
Albumin: 4.3 g/dL (ref 3.6–5.1)
Alkaline phosphatase (APISO): 92 U/L (ref 37–153)
BUN: 12 mg/dL (ref 7–25)
CO2: 32 mmol/L (ref 20–32)
Calcium: 9.4 mg/dL (ref 8.6–10.4)
Chloride: 98 mmol/L (ref 98–110)
Creat: 0.72 mg/dL (ref 0.60–1.00)
Globulin: 2.7 g/dL (calc) (ref 1.9–3.7)
Glucose, Bld: 235 mg/dL — ABNORMAL HIGH (ref 65–99)
Potassium: 4.4 mmol/L (ref 3.5–5.3)
Sodium: 140 mmol/L (ref 135–146)
Total Bilirubin: 0.4 mg/dL (ref 0.2–1.2)
Total Protein: 7 g/dL (ref 6.1–8.1)

## 2022-07-05 LAB — CK: Total CK: 102 U/L (ref 29–143)

## 2022-07-05 LAB — SEDIMENTATION RATE: Sed Rate: 34 mm/h — ABNORMAL HIGH (ref 0–30)

## 2022-07-15 ENCOUNTER — Other Ambulatory Visit: Payer: Self-pay | Admitting: Family Medicine

## 2022-07-16 ENCOUNTER — Encounter: Payer: Self-pay | Admitting: Family Medicine

## 2022-07-17 ENCOUNTER — Telehealth: Payer: Self-pay

## 2022-07-17 NOTE — Telephone Encounter (Signed)
Initiated Prior authorization FOY:DXAJOINOM '3MG'$ /0.5ML pen-injectors Via: Covermymeds Case/Key:[id number] Status: approved as of 07/17/21 Reason:Drug is covered by current benefit plan. No further PA activity needed Called [facility],Medication is out of stock Notified Pt via: Mychart

## 2022-07-27 DIAGNOSIS — K30 Functional dyspepsia: Secondary | ICD-10-CM | POA: Diagnosis not present

## 2022-07-27 DIAGNOSIS — Z794 Long term (current) use of insulin: Secondary | ICD-10-CM | POA: Diagnosis not present

## 2022-07-27 DIAGNOSIS — K648 Other hemorrhoids: Secondary | ICD-10-CM | POA: Diagnosis not present

## 2022-07-27 DIAGNOSIS — K529 Noninfective gastroenteritis and colitis, unspecified: Secondary | ICD-10-CM | POA: Diagnosis not present

## 2022-07-27 DIAGNOSIS — D128 Benign neoplasm of rectum: Secondary | ICD-10-CM | POA: Diagnosis not present

## 2022-07-27 DIAGNOSIS — Z881 Allergy status to other antibiotic agents status: Secondary | ICD-10-CM | POA: Diagnosis not present

## 2022-07-27 DIAGNOSIS — K5730 Diverticulosis of large intestine without perforation or abscess without bleeding: Secondary | ICD-10-CM | POA: Diagnosis not present

## 2022-07-27 DIAGNOSIS — Z888 Allergy status to other drugs, medicaments and biological substances status: Secondary | ICD-10-CM | POA: Diagnosis not present

## 2022-07-27 DIAGNOSIS — K5710 Diverticulosis of small intestine without perforation or abscess without bleeding: Secondary | ICD-10-CM | POA: Diagnosis not present

## 2022-07-27 DIAGNOSIS — K621 Rectal polyp: Secondary | ICD-10-CM | POA: Diagnosis not present

## 2022-07-27 DIAGNOSIS — I4719 Other supraventricular tachycardia: Secondary | ICD-10-CM | POA: Diagnosis not present

## 2022-07-27 DIAGNOSIS — E119 Type 2 diabetes mellitus without complications: Secondary | ICD-10-CM | POA: Diagnosis not present

## 2022-07-27 DIAGNOSIS — Z8673 Personal history of transient ischemic attack (TIA), and cerebral infarction without residual deficits: Secondary | ICD-10-CM | POA: Diagnosis not present

## 2022-07-27 DIAGNOSIS — Z7982 Long term (current) use of aspirin: Secondary | ICD-10-CM | POA: Diagnosis not present

## 2022-07-27 DIAGNOSIS — Z79899 Other long term (current) drug therapy: Secondary | ICD-10-CM | POA: Diagnosis not present

## 2022-07-27 DIAGNOSIS — Z882 Allergy status to sulfonamides status: Secondary | ICD-10-CM | POA: Diagnosis not present

## 2022-07-27 DIAGNOSIS — Z6841 Body Mass Index (BMI) 40.0 and over, adult: Secondary | ICD-10-CM | POA: Diagnosis not present

## 2022-07-27 DIAGNOSIS — G4730 Sleep apnea, unspecified: Secondary | ICD-10-CM | POA: Diagnosis not present

## 2022-07-27 DIAGNOSIS — Z7985 Long-term (current) use of injectable non-insulin antidiabetic drugs: Secondary | ICD-10-CM | POA: Diagnosis not present

## 2022-07-27 DIAGNOSIS — E739 Lactose intolerance, unspecified: Secondary | ICD-10-CM | POA: Diagnosis not present

## 2022-07-27 DIAGNOSIS — R197 Diarrhea, unspecified: Secondary | ICD-10-CM | POA: Diagnosis not present

## 2022-07-27 DIAGNOSIS — Z7984 Long term (current) use of oral hypoglycemic drugs: Secondary | ICD-10-CM | POA: Diagnosis not present

## 2022-07-27 DIAGNOSIS — R112 Nausea with vomiting, unspecified: Secondary | ICD-10-CM | POA: Diagnosis not present

## 2022-07-27 DIAGNOSIS — Z87891 Personal history of nicotine dependence: Secondary | ICD-10-CM | POA: Diagnosis not present

## 2022-07-27 DIAGNOSIS — K2970 Gastritis, unspecified, without bleeding: Secondary | ICD-10-CM | POA: Diagnosis not present

## 2022-07-27 DIAGNOSIS — I10 Essential (primary) hypertension: Secondary | ICD-10-CM | POA: Diagnosis not present

## 2022-07-27 DIAGNOSIS — Z7902 Long term (current) use of antithrombotics/antiplatelets: Secondary | ICD-10-CM | POA: Diagnosis not present

## 2022-07-27 DIAGNOSIS — K2900 Acute gastritis without bleeding: Secondary | ICD-10-CM | POA: Diagnosis not present

## 2022-07-27 DIAGNOSIS — K2960 Other gastritis without bleeding: Secondary | ICD-10-CM | POA: Diagnosis not present

## 2022-07-27 DIAGNOSIS — K209 Esophagitis, unspecified without bleeding: Secondary | ICD-10-CM | POA: Diagnosis not present

## 2022-07-27 LAB — HM COLONOSCOPY

## 2022-08-01 DIAGNOSIS — K209 Esophagitis, unspecified without bleeding: Secondary | ICD-10-CM | POA: Diagnosis not present

## 2022-08-01 DIAGNOSIS — K2970 Gastritis, unspecified, without bleeding: Secondary | ICD-10-CM | POA: Diagnosis not present

## 2022-08-01 DIAGNOSIS — K259 Gastric ulcer, unspecified as acute or chronic, without hemorrhage or perforation: Secondary | ICD-10-CM | POA: Diagnosis not present

## 2022-08-06 DIAGNOSIS — G4733 Obstructive sleep apnea (adult) (pediatric): Secondary | ICD-10-CM | POA: Diagnosis not present

## 2022-08-06 DIAGNOSIS — Z6841 Body Mass Index (BMI) 40.0 and over, adult: Secondary | ICD-10-CM | POA: Diagnosis not present

## 2022-08-09 ENCOUNTER — Ambulatory Visit (INDEPENDENT_AMBULATORY_CARE_PROVIDER_SITE_OTHER): Payer: Medicare Other | Admitting: Podiatry

## 2022-08-09 DIAGNOSIS — E1149 Type 2 diabetes mellitus with other diabetic neurological complication: Secondary | ICD-10-CM | POA: Diagnosis not present

## 2022-08-09 DIAGNOSIS — M79675 Pain in left toe(s): Secondary | ICD-10-CM

## 2022-08-09 DIAGNOSIS — B351 Tinea unguium: Secondary | ICD-10-CM

## 2022-08-09 DIAGNOSIS — Z7901 Long term (current) use of anticoagulants: Secondary | ICD-10-CM | POA: Diagnosis not present

## 2022-08-09 DIAGNOSIS — L84 Corns and callosities: Secondary | ICD-10-CM | POA: Diagnosis not present

## 2022-08-09 DIAGNOSIS — M79674 Pain in right toe(s): Secondary | ICD-10-CM

## 2022-08-14 ENCOUNTER — Ambulatory Visit: Payer: Medicare Other | Admitting: Sports Medicine

## 2022-08-14 DIAGNOSIS — H43811 Vitreous degeneration, right eye: Secondary | ICD-10-CM | POA: Diagnosis not present

## 2022-08-14 DIAGNOSIS — H2512 Age-related nuclear cataract, left eye: Secondary | ICD-10-CM | POA: Diagnosis not present

## 2022-08-14 DIAGNOSIS — E113213 Type 2 diabetes mellitus with mild nonproliferative diabetic retinopathy with macular edema, bilateral: Secondary | ICD-10-CM | POA: Diagnosis not present

## 2022-08-15 ENCOUNTER — Encounter: Payer: Self-pay | Admitting: Pharmacist

## 2022-08-15 NOTE — Progress Notes (Signed)
Subjective: Chief Complaint  Patient presents with   routine foot care     72 y.o. returns the office today for painful, elongated, thickened toenails which they cannot trim herself.  She denies any open sores.  She also has calluses to her feet.  No open lesions.  Her husband recently passed away.  PCP: Hali Marry, MD Last seen 05/29/2022 A1c: 7.6 on 05/29/2022  Objective: AAO 3, NAD DP/PT pulses palpable, CRT less than 3 seconds Protective sensation decreased with Simms Weinstein monofilament Nails hypertrophic, dystrophic, elongated, brittle, discolored 10. There is tenderness overlying the nails 1-5 bilaterally. There is no surrounding erythema or drainage along the nail sites. Hyperkeratotic lesion right foot submetatarsal 5, metatarsal base as well as the heel.  No underlying ulceration drainage or any signs of infection noted today. Decrease in arch upon weightbearing.  No open lesions or other pre-ulcerative lesions are identified. No pain with calf compression, swelling, warmth, erythema.  Assessment: Patient presents with symptomatic onychomycosis, hyperkeratotic lesions  Plan: -Treatment options including alternatives, risks, complications were discussed -Nails sharply debrided 10 without complication/bleeding. -Hyperkeratotic lesion sharply debrided x 4 without any complications or bleeding.  Recommend moisturizer daily.  -Continue offloading inserts. -Discussed daily foot inspection. If there are any changes, to call the office immediately.  -Follow-up in 3 months or sooner if any problems are to arise. In the meantime, encouraged to call the office with any questions, concerns, changes symptoms.  Celesta Gentile, DPM

## 2022-08-17 ENCOUNTER — Ambulatory Visit (INDEPENDENT_AMBULATORY_CARE_PROVIDER_SITE_OTHER): Payer: Medicare Other

## 2022-08-17 ENCOUNTER — Ambulatory Visit (INDEPENDENT_AMBULATORY_CARE_PROVIDER_SITE_OTHER): Payer: Medicare Other | Admitting: Sports Medicine

## 2022-08-17 DIAGNOSIS — M170 Bilateral primary osteoarthritis of knee: Secondary | ICD-10-CM

## 2022-08-17 DIAGNOSIS — M25561 Pain in right knee: Secondary | ICD-10-CM

## 2022-08-17 DIAGNOSIS — M2550 Pain in unspecified joint: Secondary | ICD-10-CM

## 2022-08-17 DIAGNOSIS — M1712 Unilateral primary osteoarthritis, left knee: Secondary | ICD-10-CM | POA: Diagnosis not present

## 2022-08-17 DIAGNOSIS — M25562 Pain in left knee: Secondary | ICD-10-CM

## 2022-08-17 DIAGNOSIS — M1711 Unilateral primary osteoarthritis, right knee: Secondary | ICD-10-CM | POA: Diagnosis not present

## 2022-08-17 DIAGNOSIS — M47816 Spondylosis without myelopathy or radiculopathy, lumbar region: Secondary | ICD-10-CM

## 2022-08-17 NOTE — Assessment & Plan Note (Signed)
Increasing pain right knee, did not improve much with prednisone. She has never had a knee injection before, today she is interested. We did perform an ultrasound-guided knee injection. Return to see me in 4 to 6 weeks.

## 2022-08-17 NOTE — Progress Notes (Signed)
    Procedures performed today:    Procedure: Real-time Ultrasound Guided injection of the right knee Device: Samsung HS60  Verbal informed consent obtained.  Time-out conducted.  Noted no overlying erythema, induration, or other signs of local infection.  Skin prepped in a sterile fashion.  Local anesthesia: Topical Ethyl chloride.  With sterile technique and under real time ultrasound guidance: Mild effusion noted, 1 cc Kenalog 40, 2 cc lidocaine, 2 cc bupivacaine injected easily Completed without difficulty  Advised to call if fevers/chills, erythema, induration, drainage, or persistent bleeding.  Images permanently stored and available for review in PACS.  Impression: Technically successful ultrasound guided injection.  Independent interpretation of notes and tests performed by another provider:   None.  Brief History, Exam, Impression, and Recommendations:    Primary osteoarthritis of both knees Increasing pain right knee, did not improve much with prednisone. She has never had a knee injection before, today she is interested. We did perform an ultrasound-guided knee injection. Return to see me in 4 to 6 weeks.  Lumbar spondylosis I saw this pleasant 72 year old female, she had polyarthralgias both hips, buttocks, low back. We did some rheumatoid labs, ESR was minimally elevated in the 30s, she did dramatically well with prednisone. This raises the possibility of polymyalgia rheumatica. Her dominant pain is bilateral buttocks likely related to her lumbar spondylosis. We will proceed with MRI of the lumbar spine as she has failed greater than 6 weeks of conservative treatment including steroids and physician directed conservative treatment. We would likely end up setting her up with a lumbar epidural.    ____________________________________________ Gwen Her. Dianah Field, M.D., ABFM., CAQSM., AME. Primary Care and Sports Medicine [facility]  [facility]  Adjunct Professor of [employer] [facility] [employer]  Risk manager

## 2022-08-17 NOTE — Assessment & Plan Note (Signed)
I saw this pleasant 72 year old female, she had polyarthralgias both hips, buttocks, low back. We did some rheumatoid labs, ESR was minimally elevated in the 30s, she did dramatically well with prednisone. This raises the possibility of polymyalgia rheumatica. Her dominant pain is bilateral buttocks likely related to her lumbar spondylosis. We will proceed with MRI of the lumbar spine as she has failed greater than 6 weeks of conservative treatment including steroids and physician directed conservative treatment. We would likely end up setting her up with a lumbar epidural.

## 2022-08-20 ENCOUNTER — Other Ambulatory Visit: Payer: Self-pay | Admitting: Pharmacist

## 2022-08-20 NOTE — Progress Notes (Signed)
Patient appearing on report for [employer] - Hypertension Control report due to last documented ambulatory blood pressure of 141/44 on 05/29/22. Next appointment with PCP is 08/30/22   Outreached patient to discuss hypertension control and medication management. Left voicemail for patient to return my call at their convenience.   Larinda Buttery, PharmD Clinical Pharmacist [facility] [phone]

## 2022-08-21 ENCOUNTER — Telehealth: Payer: Self-pay

## 2022-08-21 DIAGNOSIS — M2550 Pain in unspecified joint: Secondary | ICD-10-CM

## 2022-08-21 NOTE — Telephone Encounter (Signed)
-----   Message from Silverio Decamp, MD sent at 08/21/2022  1:11 PM EST ----- Severe arthritis in both knees, no change in plan.

## 2022-08-26 ENCOUNTER — Other Ambulatory Visit: Payer: Self-pay | Admitting: Family Medicine

## 2022-08-26 DIAGNOSIS — M2550 Pain in unspecified joint: Secondary | ICD-10-CM

## 2022-08-26 DIAGNOSIS — M5416 Radiculopathy, lumbar region: Secondary | ICD-10-CM

## 2022-08-30 ENCOUNTER — Ambulatory Visit: Payer: Medicare Other | Admitting: Family Medicine

## 2022-08-30 ENCOUNTER — Ambulatory Visit
Admission: EM | Admit: 2022-08-30 | Discharge: 2022-08-30 | Disposition: A | Discharge status: Home / Self Care | Payer: Medicare Other | Attending: Emergency Medicine | Admitting: Emergency Medicine

## 2022-08-30 DIAGNOSIS — B309 Viral conjunctivitis, unspecified: Secondary | ICD-10-CM

## 2022-08-30 MED ORDER — OLOPATADINE HCL 0.1 % OP SOLN: 1.0000 [drp] | Freq: Two times a day (BID) | OPHTHALMIC | 2 refills | Status: DC

## 2022-08-30 NOTE — ED Provider Notes (Signed)
[name]    CSN: [id number] Arrival date & time: 08/30/22  1253      History   Chief Complaint Chief Complaint  Patient presents with   Conjunctivitis    HPI Natalie Wright is a 72 y.o. female.  1 day history of right eye irritation It bothered her a week ago but seemed to worsen yesterday No pain Yesterday it was red No mucous drainage or discharge Denies vision changes   Past Medical History:  Diagnosis Date   Allergy    to cats and dogs   Diabetes mellitus    Hiatal hernia    and delayed gastric emptying/ sees [facility] for recurrent diarrhea   History of cardiovascular disorder 12/10/2008   Qualifier: Diagnosis of  By: Madilyn Fireman MD, Catherine     Hypertension    OSA (obstructive sleep apnea)    CPAP 78mhg   Pedal edema    Spastic bladder    [facility]  TIA (transient ischemic attack)    hx of- sees Dr SElwin Mocha(Neurology)    Patient Active Problem List   Diagnosis Date Noted   Lumbar spondylosis 07/03/2022   Right lumbar radiculopathy 02/26/2022   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/2022   Bilateral hip pain 01/12/2022   Hearing loss due to cerumen impaction, right 01/12/2022   Hammertoe, bilateral 08/23/2021   History of foot ulcer 08/23/2021   Primary osteoarthritis of both knees 12/09/2020   History of burning pain in leg 08/29/2020   Type 2 diabetes mellitus with neurological complications ([place] [id number]  Tremor 05/05/2020   Pre-ulcerative corn or callous 01/29/2020   History of transient ischemic attack (TIA) 05/15/2018   Family history of ovarian cancer 04/02/2017   Aortic atherosclerosis ([place] 08/31/2016   Venous stasis 08/10/2016   Palpitations 07/16/2016   Atrial dilatation, left 07/16/2016   Atrial septal aneurysm [id number]  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   Cataract 04/18/2012   Hyperlipidemia 02/13/2011   Pulmonary nodules 09/22/2010   LEG PAIN 09/12/2009   Sleep apnea 04/13/2009   GERD 03/14/2009   Morbid  obesity ([place] 12/10/2008   Essential hypertension, benign 12/10/2008   RAYNAUD'S DISEASE 12/10/2008   OSTEOARTHRITIS 12/10/2008   URINARY INCONTINENCE 12/10/2008    Past Surgical History:  Procedure Laterality Date   CATARACT EXTRACTION  08/2021   vaginal skin tag removal      OB History     Gravida  4   Para  2   Term  2   Preterm      AB  1   Living         SAB  1   IAB      Ectopic      Multiple      Live Births               Home Medications    Prior to Admission medications   Medication Sig Start Date End Date Taking? Authorizing Provider  olopatadine (PATANOL) 0.1 % ophthalmic solution Place 1 drop into both eyes 2 (two) times daily. 08/30/22  Yes Zareen Jamison, RWells Guiles PA-C  acetaminophen (TYLENOL) 650 MG CR tablet Take 650 mg by mouth every 8 (eight) hours as needed.    [provider]  AMBULATORY NON FORMULARY MEDICATION Medication Name: custom fit knee high compression stocking with 15-20 mmHg pressure. Open toe preferred. 08/10/16   MHali Marry MD  aspirin 325 MG tablet Take 325 mg by mouth daily.    [provider]  cetirizine (ZYRTEC) 10 MG tablet Take 10 mg by mouth daily.    [provider]  chlorthalidone (HYGROTON) 25 MG tablet Take 1 tablet (25 mg total) by mouth every other day. 02/26/22   Hali Marry, MD  clopidogrel (PLAVIX) 75 MG tablet Take 1 tablet (75 mg total) by mouth daily. 07/18/21   Hali Marry, MD  diltiazem (CARDIZEM CD) 360 MG 24 hr capsule TAKE 1 CAPSULE(360 MG) BY MOUTH DAILY 09/30/17   Hali Marry, MD  Dulaglutide (TRULICITY) 3 0000000 SOPN ADMINISTER 3 MG UNDER THE SKIN 1 TIME A WEEK 07/16/22   Hali Marry, MD  DULoxetine (CYMBALTA) 30 MG capsule Take 1 capsule (30 mg total) by mouth daily. For musculoskeletal pain 08/27/22   Hali Marry, MD  Ferrous Sulfate Dried 200 (65 Fe) MG TABS Take 1 tablet by mouth daily.    [provider]   Insulin Pen Needle (NOVOFINE) 30G X 8 MM MISC For use each time injection for insulin 02/10/16   Hali Marry, MD  JARDIANCE 25 MG TABS tablet TAKE 1 TABLET DAILY 03/27/22   Hali Marry, MD  LANTUS 100 UNIT/ML injection INJECT 35 UNITS UNDER THE SKIN DAILY 09/20/20   Hali Marry, MD  latanoprost (XALATAN) 0.005 % ophthalmic solution 1 drop.    [provider]  lisinopril (ZESTRIL) 5 MG tablet TAKE 1 TABLET DAILY 02/19/22   Hali Marry, MD  meloxicam (MOBIC) 15 MG tablet One tab PO every 24 hours with a meal for 2 weeks, then once every 24 hours prn pain. 07/03/22   Silverio Decamp, MD  metFORMIN (GLUCOPHAGE) 1000 MG tablet TAKE 1 TABLET TWICE A DAY WITH MEALS 01/29/22   Silverio Decamp, MD  Misc. Devices MISC Transfer of care to [facility].  Please provide service to her current CPAP machine for OSA at 14 cm. water pressure.  Send directly to [place] at [facility]. 05/15/19   [provider]  Omega-3 Fatty Acids (FISH OIL) 1200 MG CAPS Take 1 capsule by mouth daily.    [provider]  pantoprazole (PROTONIX) 40 MG tablet Take 1 tablet (40 mg total) by mouth daily. 07/18/21   Hali Marry, MD  rosuvastatin (CRESTOR) 10 MG tablet TAKE 1 TABLET ONCE A WEEK AT BEDTIME 03/27/22   Hali Marry, MD    Family History Family History  Problem Relation Age of Onset   Diabetes Mother    Hypertension Mother    Glaucoma Mother    Cancer Mother        bone cancer   Alcohol abuse Father    Emphysema Father    Other Father        CHF   Ovarian cancer Sister    Endometrial cancer Sister        Endometrial stromal sarcoma    Social History Social History   Tobacco Use   Smoking status: Former   Smokeless tobacco: Never  Scientific laboratory technician Use: Never used  Substance Use Topics   Alcohol use: Yes    Comment: social   Drug use: No     Allergies   Atorvastatin, Beclomethasone, Cefaclor, Pioglitazone,  Pravastatin, Sulfa antibiotics, Cephalexin, Lipitor [atorvastatin calcium], and Statins   Review of Systems Review of Systems As per HPI  Physical Exam Triage Vital Signs ED Triage Vitals  Enc Vitals Group     BP 08/30/22 1304 (!) 141/81     Pulse Rate 08/30/22 1304 79  Resp 08/30/22 1304 14     Temp 08/30/22 1304 98.2 F (36.8 C)     Temp Source 08/30/22 1304 Oral     SpO2 08/30/22 1304 100 %     Weight --      Height --      Head Circumference --      Peak Flow --      Pain Score 08/30/22 1303 0     Pain Loc --      Pain Edu? --      Excl. in [place]? --    No data found.  Updated Vital Signs BP (!) 141/81 (BP Location: Left Arm)   Pulse 79   Temp 98.2 F (36.8 C) (Oral)   Resp 14   SpO2 100%   Visual Acuity Right Eye Distance:   Left Eye Distance:   Bilateral Distance:    Right Eye Near:   Left Eye Near:    Bilateral Near:     Physical Exam Vitals and nursing note reviewed.  Constitutional:      General: She is not in acute distress. HENT:     Mouth/Throat:     Mouth: Mucous membranes are moist.     Pharynx: Oropharynx is clear.  Eyes:     General: Lids are normal. Lids are everted, no foreign bodies appreciated. Vision grossly intact.        Right eye: No discharge.        Left eye: No discharge.     Extraocular Movements: Extraocular movements intact.     Conjunctiva/sclera: Conjunctivae normal.     Right eye: Right conjunctiva is not injected.     Left eye: Left conjunctiva is not injected.     Pupils: Pupils are equal, round, and reactive to light.  Cardiovascular:     Rate and Rhythm: Normal rate and regular rhythm.  Pulmonary:     Effort: Pulmonary effort is normal.  Neurological:     Mental Status: She is alert and oriented to person, place, and time.     UC Treatments / Results  Labs (all labs ordered are listed, but only abnormal results are displayed) Labs Reviewed - No data to display  EKG   Radiology No results  found.  Procedures Procedures (including critical care time)  Medications Ordered in UC Medications - No data to display  Initial Impression / Assessment and Plan / UC Course  I have reviewed the triage vital signs and the nursing notes.  Pertinent labs & imaging results that were available during my care of the patient were reviewed by me and considered in my medical decision making (see chart for details).  Viral conjunctivitis Olopatadine eye drops BID for irritation Discussed symptoms to return for She does have eye doc she can follow with if needed  Final Clinical Impressions(s) / UC Diagnoses   Final diagnoses:  Viral conjunctivitis, right eye     Discharge Instructions      Use the eyedrops twice daily to reduce irritation Apply warm compress to the eyelid as needed  Please return with any concerns, or follow with your eye specialist     ED Prescriptions     Medication Sig Dispense Auth. Provider   olopatadine (PATANOL) 0.1 % ophthalmic solution Place 1 drop into both eyes 2 (two) times daily. 5 mL Kemba Hoppes, Wells Guiles, PA-C      PDMP not reviewed this encounter.   Les Pou, [place] 08/30/22 1343

## 2022-08-30 NOTE — ED Triage Notes (Signed)
Pt presents with concerns for rt eye conjunctivitis.

## 2022-08-30 NOTE — Discharge Instructions (Signed)
Use the eyedrops twice daily to reduce irritation Apply warm compress to the eyelid as needed  Please return with any concerns, or follow with your eye specialist

## 2022-09-03 ENCOUNTER — Ambulatory Visit (INDEPENDENT_AMBULATORY_CARE_PROVIDER_SITE_OTHER): Payer: Medicare Other

## 2022-09-03 DIAGNOSIS — M545 Low back pain, unspecified: Secondary | ICD-10-CM

## 2022-09-03 DIAGNOSIS — M2550 Pain in unspecified joint: Secondary | ICD-10-CM

## 2022-09-03 DIAGNOSIS — M25551 Pain in right hip: Secondary | ICD-10-CM

## 2022-09-03 DIAGNOSIS — M5126 Other intervertebral disc displacement, lumbar region: Secondary | ICD-10-CM | POA: Diagnosis not present

## 2022-09-03 DIAGNOSIS — G8929 Other chronic pain: Secondary | ICD-10-CM | POA: Diagnosis not present

## 2022-09-03 DIAGNOSIS — M25552 Pain in left hip: Secondary | ICD-10-CM | POA: Diagnosis not present

## 2022-09-03 DIAGNOSIS — M48061 Spinal stenosis, lumbar region without neurogenic claudication: Secondary | ICD-10-CM | POA: Diagnosis not present

## 2022-09-10 ENCOUNTER — Ambulatory Visit (INDEPENDENT_AMBULATORY_CARE_PROVIDER_SITE_OTHER): Payer: Medicare Other | Admitting: Family Medicine

## 2022-09-10 ENCOUNTER — Encounter: Payer: Self-pay | Admitting: Family Medicine

## 2022-09-10 VITALS — BP 128/51 | HR 71 | Ht 61.0 in | Wt 315.0 lb

## 2022-09-10 DIAGNOSIS — I10 Essential (primary) hypertension: Secondary | ICD-10-CM

## 2022-09-10 DIAGNOSIS — E1149 Type 2 diabetes mellitus with other diabetic neurological complication: Secondary | ICD-10-CM

## 2022-09-10 DIAGNOSIS — K219 Gastro-esophageal reflux disease without esophagitis: Secondary | ICD-10-CM | POA: Diagnosis not present

## 2022-09-10 DIAGNOSIS — H109 Unspecified conjunctivitis: Secondary | ICD-10-CM

## 2022-09-10 LAB — POCT GLYCOSYLATED HEMOGLOBIN (HGB A1C): Hemoglobin A1C: 8 % — AB (ref 4.0–5.6)

## 2022-09-10 MED ORDER — METFORMIN HCL 1000 MG PO TABS: 1000.0000 mg | ORAL_TABLET | Freq: Two times a day (BID) | ORAL | 1 refills | Status: DC

## 2022-09-10 MED ORDER — CLOPIDOGREL BISULFATE 75 MG PO TABS: 75.0000 mg | ORAL_TABLET | Freq: Every day | ORAL | 3 refills | Status: DC

## 2022-09-10 MED ORDER — PANTOPRAZOLE SODIUM 40 MG PO TBEC: 40.0000 mg | DELAYED_RELEASE_TABLET | Freq: Every day | ORAL | 3 refills | Status: DC

## 2022-09-10 MED ORDER — AZITHROMYCIN 1 % OP SOLN: OPHTHALMIC | 0 refills | Status: DC

## 2022-09-10 MED ORDER — INSULIN GLARGINE 100 UNIT/ML ~~LOC~~ SOLN: SUBCUTANEOUS | 3 refills | Status: DC

## 2022-09-10 MED ORDER — FLUCONAZOLE 150 MG PO TABS: ORAL_TABLET | ORAL | 1 refills | Status: DC

## 2022-09-10 MED ORDER — LISINOPRIL 5 MG PO TABS: 5.0000 mg | ORAL_TABLET | Freq: Every day | ORAL | 1 refills | Status: DC

## 2022-09-10 NOTE — Assessment & Plan Note (Signed)
A1c went up to 8.0 today, uncontrolled.  Waiting to hear back from GI so we can see if we can go up on the Trulicity to 4.5 mg she was on the higher dose at 1 time.  If she tolerates that well without any increase in her nausea vomiting or dizziness then the plan would be to consider even switching to Ozempic or Mounjaro for better A1c control and to hopefully reduce her need for insulin.

## 2022-09-10 NOTE — Progress Notes (Signed)
Established Patient Office Visit  Subjective   Patient ID: Natalie Wright, female    DOB: 1950-08-01  Age: 72 y.o. MRN: [id number]  Chief Complaint  Patient presents with   Diabetes    HPI  Hypertension- Pt denies chest pain, SOB, dizziness, or heart palpitations.  Taking meds as directed w/o problems.  Denies medication side effects.    Diabetes - no hypoglycemic events. No wounds or sores that are not healing well. No increased thirst or urination. Checking glucose at home. Taking medications as prescribed without any side effects. On truliity '3mg'$     She has a follow-up with her GI next week to go over her recent colonoscopy and endoscopy results she was having episodes of nausea vomiting and dizziness.  It sounds like they did see esophageal thrush and they have treated her for that.  She was negative for celiac, H. pylori.  She did have a benign rectal polyp.  Did see our sports medicine doctor in regards to osteoarthritis in her both knees and pain in her low back.  They are discussing doing injections in April.  She is also been dealing with some conjunctivitis.  She had some type of eye inflammation going on a few months ago and ended up with an injection in her eye it finally improved and she was released from the eye doctor in January.  But more recently the eyes started looking red and feeling irritated again with just a little bit of drainage. She went to urgent care and was seen on February 29 for viral conjunctivitis gave her a Patanol g eyedrops.  She is here today because it is really not any better.  She denies any significant vision changes.    ROS    Objective:     BP (!) 128/51   Pulse 71   Ht '5\' 1"'$  (1.549 m)   Wt (!) 315 lb (142.9 kg)   SpO2 100%   BMI 59.52 kg/m    Physical Exam Vitals and nursing note reviewed.  Constitutional:      Appearance: She is well-developed.  HENT:     Head: Normocephalic and atraumatic.  Cardiovascular:     Rate and  Rhythm: Normal rate and regular rhythm.     Heart sounds: Normal heart sounds.  Pulmonary:     Effort: Pulmonary effort is normal.     Breath sounds: Normal breath sounds.  Skin:    General: Skin is warm and dry.  Neurological:     Mental Status: She is alert and oriented to person, place, and time.  Psychiatric:        Behavior: Behavior normal.      Results for orders placed or performed in visit on 09/10/22  POCT glycosylated hemoglobin (Hb A1C)  Result Value Ref Range   Hemoglobin A1C 8.0 (A) 4.0 - 5.6 %   HbA1c POC (<> result, manual entry)     HbA1c, POC (prediabetic range)     HbA1c, POC (controlled diabetic range)    HM COLONOSCOPY  Result Value Ref Range   HM Colonoscopy See Report (in chart) See Report (in chart), Patient Reported      The 10-year ASCVD risk score (Arnett DK, et al., 2019) is: 22.5%    Assessment & Plan:   Problem List Items Addressed This Visit       Cardiovascular and Mediastinum   Essential hypertension, benign    Continue current blood pressure regimen.  Repeat blood pressure looks fantastic today.  Relevant Medications   clopidogrel (PLAVIX) 75 MG tablet   lisinopril (ZESTRIL) 5 MG tablet     Digestive   GERD   Relevant Medications   pantoprazole (PROTONIX) 40 MG tablet     Endocrine   Type 2 diabetes mellitus with neurological complications (HCC) - Primary    A1c went up to 8.0 today, uncontrolled.  Waiting to hear back from GI so we can see if we can go up on the Trulicity to 4.5 mg she was on the higher dose at 1 time.  If she tolerates that well without any increase in her nausea vomiting or dizziness then the plan would be to consider even switching to Ozempic or Mounjaro for better A1c control and to hopefully reduce her need for insulin.      Relevant Medications   insulin glargine (LANTUS) 100 UNIT/ML injection   metFORMIN (GLUCOPHAGE) 1000 MG tablet   lisinopril (ZESTRIL) 5 MG tablet   Other Relevant Orders    POCT glycosylated hemoglobin (Hb A1C) (Completed)     Other   Morbid obesity (HCC)   Relevant Medications   clopidogrel (PLAVIX) 75 MG tablet   insulin glargine (LANTUS) 100 UNIT/ML injection   metFORMIN (GLUCOPHAGE) 1000 MG tablet   Other Visit Diagnoses     Conjunctivitis of right eye, unspecified conjunctivitis type       Relevant Medications   azithromycin (AZASITE) 1 % ophthalmic solution      Conjunctivitis, right eye - switch to azithromycin ophthalmic drops.  If not resolved in 1 week then highly encouraged her to follow-up with her eye doctor.  Return in about 3 months (around 12/11/2022) for DM.    Beatrice Lecher, MD

## 2022-09-10 NOTE — Patient Instructions (Addendum)
Please restart your 40 mg lisinopril.  I would recommend starting with a half a tablet just 20 mg for at least the next week or 2.  If your blood pressures are coming down under 130 then stay at that dose.  If they are still in the 140s then go up to a whole tab of the lisinopril.  I can always update your prescription.

## 2022-09-10 NOTE — Assessment & Plan Note (Addendum)
Continue current blood pressure regimen.  Repeat blood pressure looks fantastic today.

## 2022-09-13 IMAGING — MG MM DIGITAL SCREENING BILAT W/ TOMO AND CAD
8 series · 8 of 24 positions shown · non-contrast
Comparison: Previous exam(s).

CLINICAL DATA: Screening.

EXAM:
DIGITAL SCREENING BILATERAL MAMMOGRAM WITH TOMOSYNTHESIS AND CAD
TECHNIQUE: Bilateral screening digital craniocaudal and mediolateral oblique
mammograms were obtained. Bilateral screening digital breast
tomosynthesis was performed. The images were evaluated with
computer-aided detection.

[L MLO synth-2D]
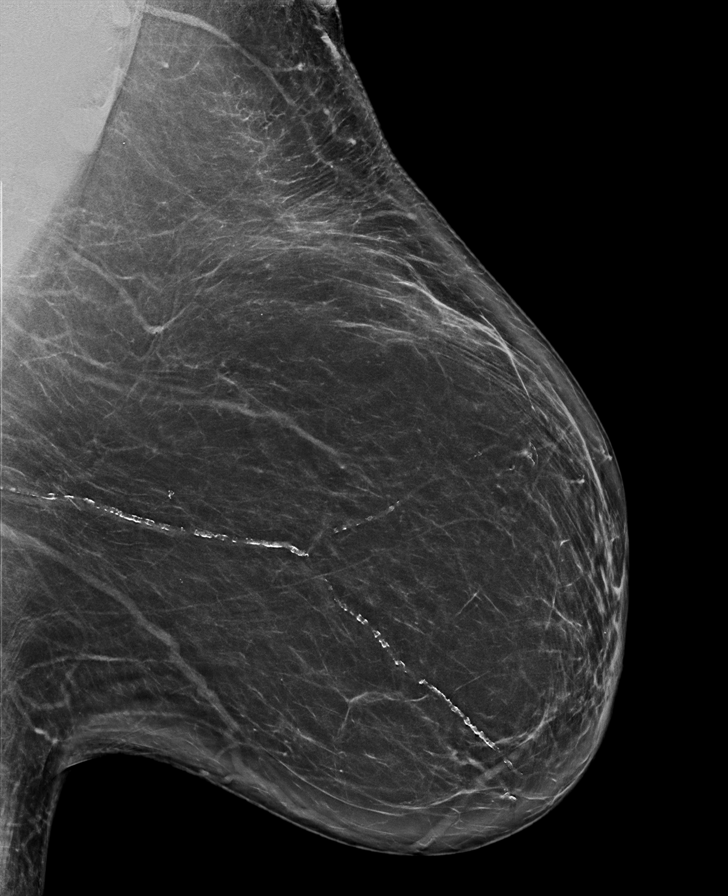

[L CC synth-2D]
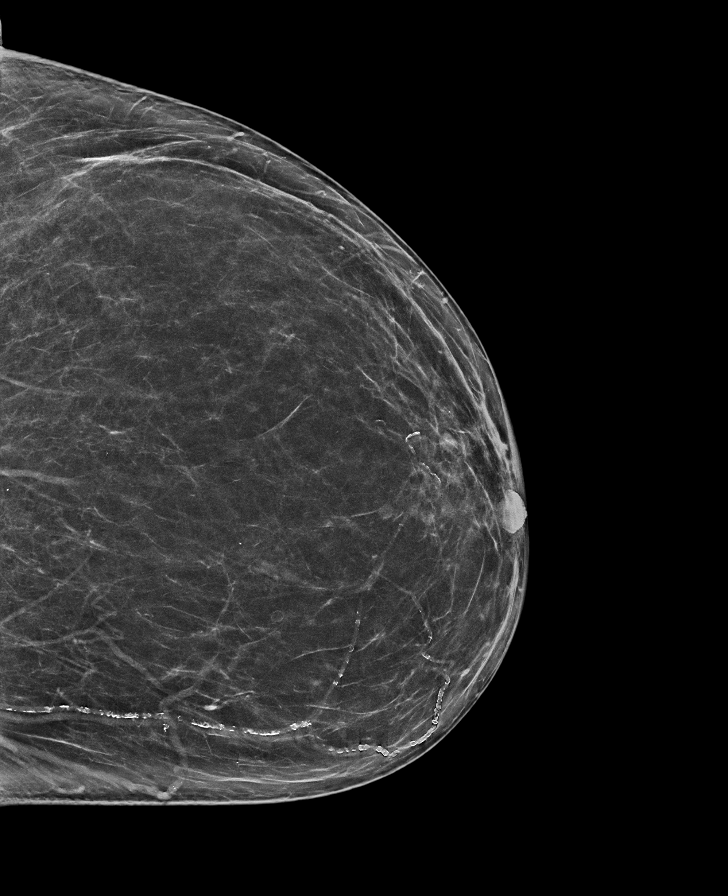

[R MLO synth-2D]
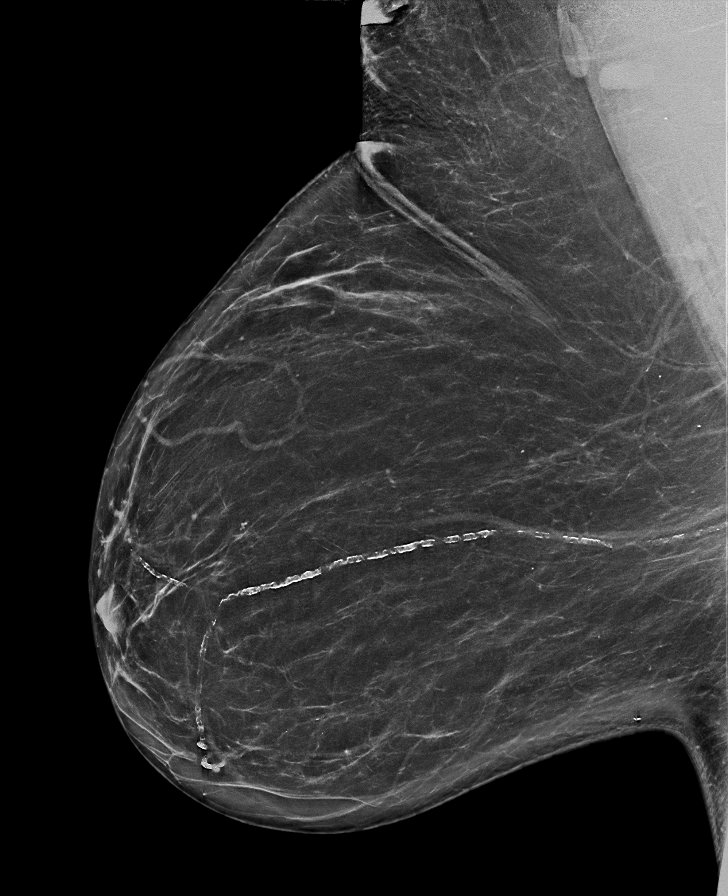

[R CC synth-2D]
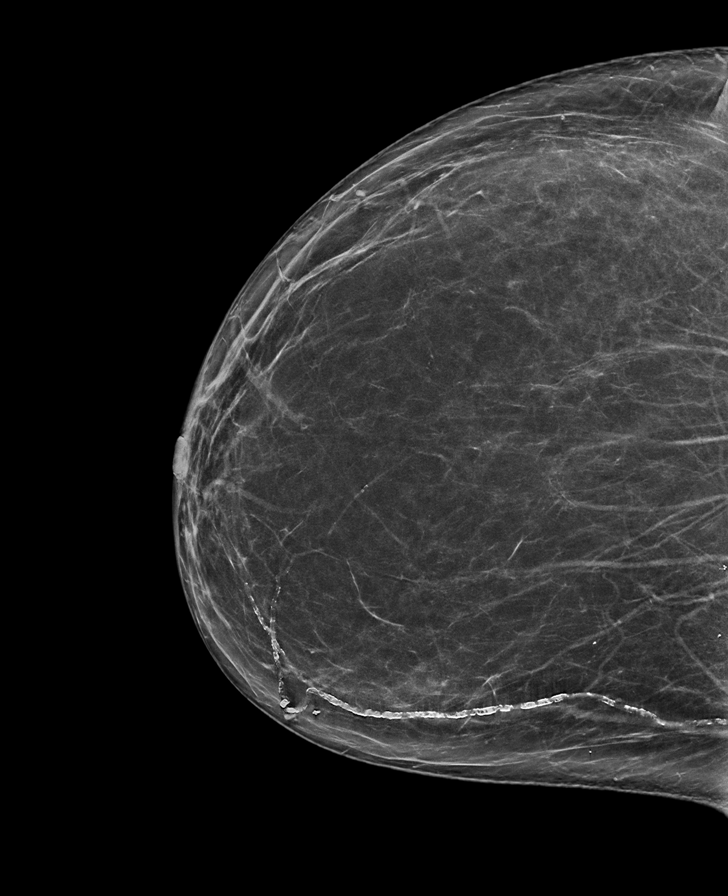

[L MLO tomo · tomo slice 46/91.0]
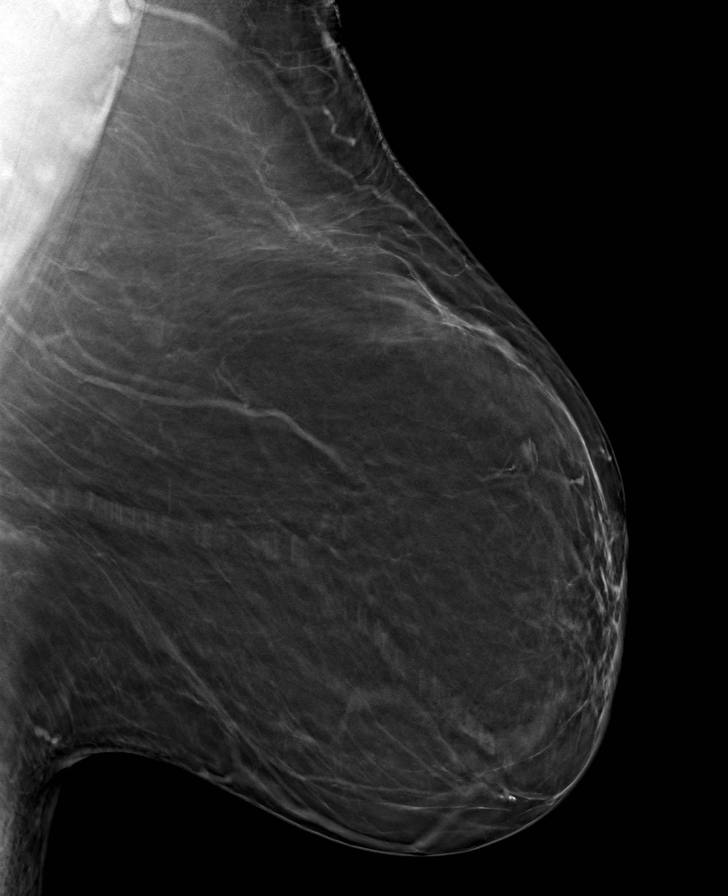

[R MLO tomo · tomo slice 48/95.0]
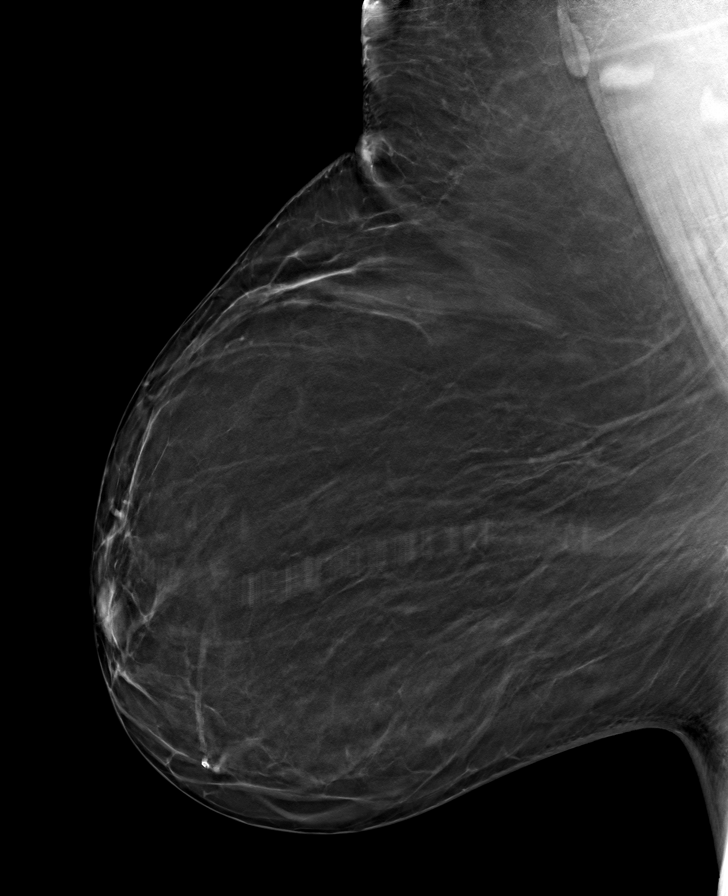

[L CC tomo · tomo slice 39/78.0]
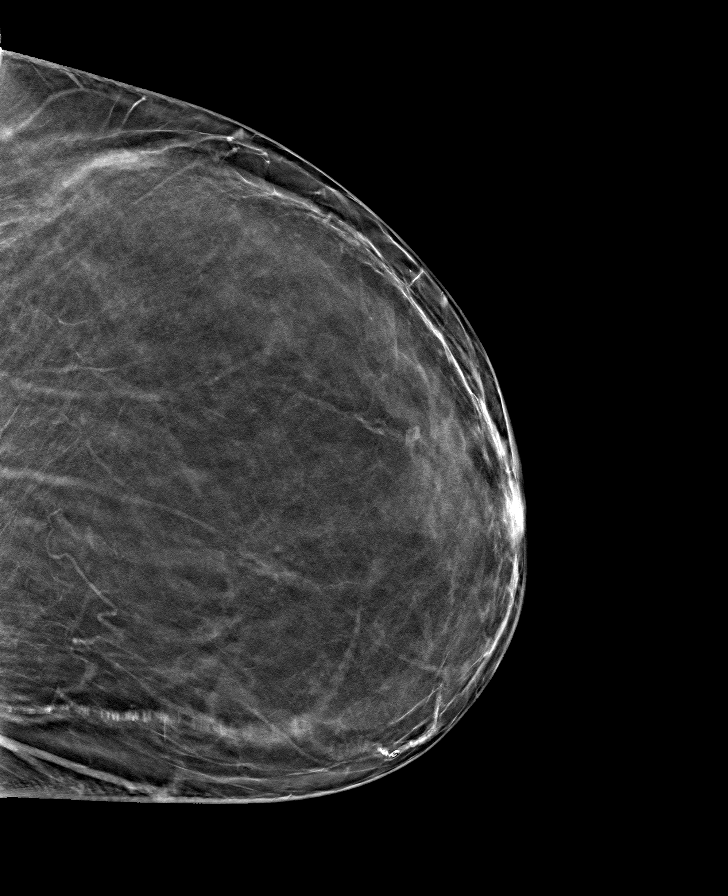

[R CC tomo · tomo slice 41/81.0]
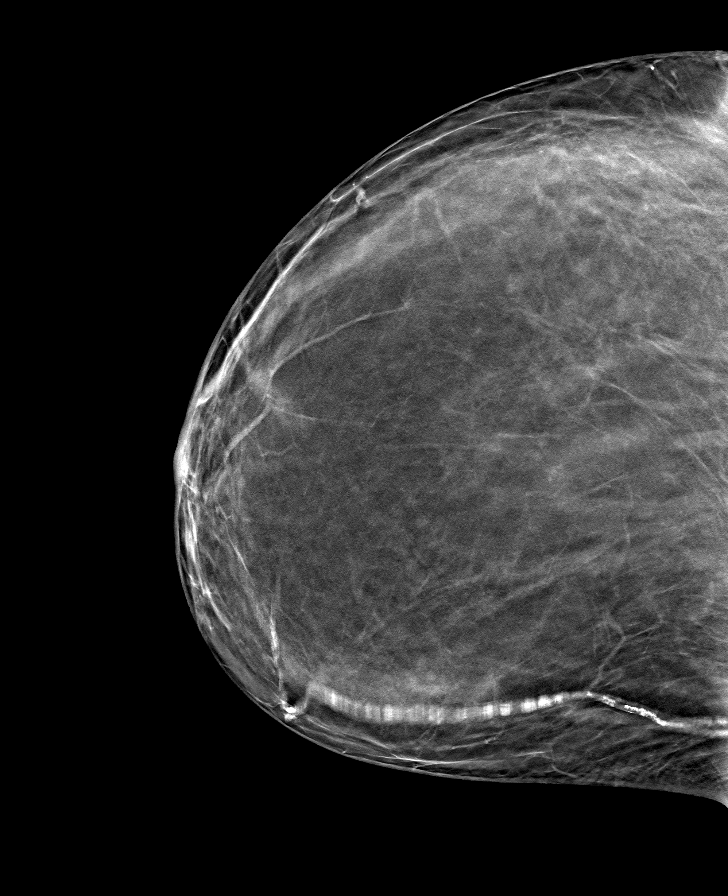

[8 of 24 positions shown; findings below may reference images not displayed]

ACR Breast Density Category b: There are scattered areas of
fibroglandular density.
FINDINGS: In the left breast, a possible mass warrants further evaluation. In
the right breast, no findings suspicious for malignancy.
IMPRESSION: Further evaluation is suggested for a possible mass in the left
breast.

RECOMMENDATION:
Diagnostic mammogram and possibly ultrasound of the left breast.
(Code:[id number]-X-44M)

The patient will be contacted regarding the findings, and additional
imaging will be scheduled.

BI-RADS CATEGORY  0: Incomplete. Need additional imaging evaluation
and/or prior mammograms for comparison.

## 2022-09-18 DIAGNOSIS — K219 Gastro-esophageal reflux disease without esophagitis: Secondary | ICD-10-CM | POA: Diagnosis not present

## 2022-09-18 DIAGNOSIS — K209 Esophagitis, unspecified without bleeding: Secondary | ICD-10-CM | POA: Diagnosis not present

## 2022-09-18 DIAGNOSIS — R197 Diarrhea, unspecified: Secondary | ICD-10-CM | POA: Diagnosis not present

## 2022-10-01 ENCOUNTER — Ambulatory Visit (INDEPENDENT_AMBULATORY_CARE_PROVIDER_SITE_OTHER): Payer: Medicare Other | Admitting: Sports Medicine

## 2022-10-01 ENCOUNTER — Telehealth: Payer: Self-pay | Admitting: Sports Medicine

## 2022-10-01 ENCOUNTER — Other Ambulatory Visit (INDEPENDENT_AMBULATORY_CARE_PROVIDER_SITE_OTHER): Payer: Medicare Other

## 2022-10-01 DIAGNOSIS — M48061 Spinal stenosis, lumbar region without neurogenic claudication: Secondary | ICD-10-CM

## 2022-10-01 DIAGNOSIS — M170 Bilateral primary osteoarthritis of knee: Secondary | ICD-10-CM

## 2022-10-01 DIAGNOSIS — R197 Diarrhea, unspecified: Secondary | ICD-10-CM | POA: Diagnosis not present

## 2022-10-01 DIAGNOSIS — I10 Essential (primary) hypertension: Secondary | ICD-10-CM | POA: Diagnosis not present

## 2022-10-01 DIAGNOSIS — E1149 Type 2 diabetes mellitus with other diabetic neurological complication: Secondary | ICD-10-CM | POA: Diagnosis not present

## 2022-10-01 DIAGNOSIS — E785 Hyperlipidemia, unspecified: Secondary | ICD-10-CM | POA: Diagnosis not present

## 2022-10-01 MED ORDER — TRAMADOL HCL 50 MG PO TABS: 50.0000 mg | ORAL_TABLET | Freq: Three times a day (TID) | ORAL | 0 refills | Status: DC | PRN

## 2022-10-01 NOTE — Assessment & Plan Note (Signed)
Severe lumbar spinal stenosis L4-S1 worse at the L4-L5 level. Has failed everything, adding an epidural, return to see me 6 weeks. I think the majority of her hip, buttock pain, back pain is from her spinal stenosis rather than polymyalgia rheumatica.

## 2022-10-01 NOTE — Progress Notes (Addendum)
    Procedures performed today:    Procedure: Real-time Ultrasound Guided injection of the left knee Device: Samsung HS60  Verbal informed consent obtained.  Time-out conducted.  Noted no overlying erythema, induration, or other signs of local infection.  Skin prepped in a sterile fashion.  Local anesthesia: Topical Ethyl chloride.  With sterile technique and under real time ultrasound guidance: Trace effusion noted, 1 cc Kenalog 40, 2 cc lidocaine, 2 cc bupivacaine injected easily Completed without difficulty  Advised to call if fevers/chills, erythema, induration, drainage, or persistent bleeding.  Images permanently stored and available for review in PACS.  Impression: Technically successful ultrasound guided injection.  Independent interpretation of notes and tests performed by another provider:   None.  Brief History, Exam, Impression, and Recommendations:    Primary osteoarthritis of both knees Only a month of relief from an injection in her right knee approximately 6 weeks ago, she has bilateral x-ray confirmed osteoarthritis, has failed oral medications, greater than 6 weeks of physician directed physical therapy. Meloxicam not effective. We will go ahead and inject her left knee with steroid today in anticipation of [facility] Proceeding with [facility]. Adding tramadol for pain.  Lumbar spinal stenosis Severe lumbar spinal stenosis L4-S1 worse at the L4-L5 level. Has failed everything, adding an epidural, return to see me 6 weeks. I think the majority of her hip, buttock pain, back pain is from her spinal stenosis rather than polymyalgia rheumatica.    ____________________________________________ Gwen Her. Dianah Field, M.D., ABFM., CAQSM., AME. Primary Care and Sports Medicine [facility]  Adjunct Professor of [place] [employer] [facility] of Medicine  Risk manager

## 2022-10-01 NOTE — Telephone Encounter (Signed)
Bilateral viscosupplementation or Orthovisc approval

## 2022-10-01 NOTE — Addendum Note (Signed)
Addended by: Silverio Decamp on: 10/01/2022 11:27 AM   Modules accepted: Orders

## 2022-10-01 NOTE — Assessment & Plan Note (Addendum)
Only a month of relief from an injection in her right knee approximately 6 weeks ago, she has bilateral x-ray confirmed osteoarthritis, has failed oral medications, greater than 6 weeks of physician directed physical therapy. Meloxicam not effective. We will go ahead and inject her left knee with steroid today in anticipation of Visco Proceeding with [facility]. Adding tramadol for pain.

## 2022-10-02 LAB — LIPID PANEL
Cholesterol: 147 mg/dL (ref <200)
HDL: 60 mg/dL (ref >=50)
LDL Cholesterol (Calc): 72 mg/dL (calc)
Non-HDL Cholesterol (Calc): 87 mg/dL (calc) (ref <130)
Total CHOL/HDL Ratio: 2.5 (calc) (ref <5.0)
Triglycerides: 69 mg/dL (ref <150)

## 2022-10-02 NOTE — Progress Notes (Signed)
Kendel, your LDL cholesterol looks great!  In fact it is the best that it has been in for years.  Keep up the good work.

## 2022-10-03 ENCOUNTER — Encounter: Payer: Self-pay | Admitting: Family Medicine

## 2022-10-03 NOTE — Telephone Encounter (Signed)
PA information submitted via [email or web address] for Orthovisc Paperwork has been printed and given to Dr. T for signatures. Once obtained, information will be faxed to [facility] at [phone]  

## 2022-10-04 MED ORDER — CHLORTHALIDONE 25 MG PO TABS: 25.0000 mg | ORAL_TABLET | ORAL | 1 refills | Status: DC

## 2022-10-08 ENCOUNTER — Encounter: Payer: Self-pay | Admitting: Sports Medicine

## 2022-10-08 DIAGNOSIS — M170 Bilateral primary osteoarthritis of knee: Secondary | ICD-10-CM

## 2022-10-08 MED ORDER — TRAMADOL HCL 50 MG PO TABS: 50.0000 mg | ORAL_TABLET | Freq: Three times a day (TID) | ORAL | 0 refills | Status: DC | PRN

## 2022-10-10 NOTE — Telephone Encounter (Signed)
Benefits Investigation Details received from [facility] Injection: Orthovisc PA required: No May fill through: [facility] Copay/Coinsurance: 0% Product Copay: 20% Administration Coinsurance: 20% Administration Copay: 20% Deductible: $240 (met: $240)

## 2022-10-10 NOTE — Telephone Encounter (Signed)
Called and left detailed VM for patient about her Orthovisc and she will call if she has any questions task complete.

## 2022-10-15 DIAGNOSIS — R197 Diarrhea, unspecified: Secondary | ICD-10-CM | POA: Diagnosis not present

## 2022-10-28 ENCOUNTER — Other Ambulatory Visit: Payer: Self-pay | Admitting: Sports Medicine

## 2022-10-28 DIAGNOSIS — M2550 Pain in unspecified joint: Secondary | ICD-10-CM

## 2022-10-30 NOTE — Discharge Instructions (Addendum)
Post Procedure Spinal Discharge Instruction Sheet  You may resume a regular diet and any medications that you routinely take (including pain medications) unless otherwise noted by MD.  No driving day of procedure.  Light activity throughout the rest of the day.  Do not do any strenuous work, exercise, bending or lifting.  The day following the procedure, you can resume normal physical activity but you should refrain from exercising or physical therapy for at least three days thereafter.  You may apply ice to the injection site, 20 minutes on, 20 minutes off, as needed. Do not apply ice directly to skin.    Common Side Effects:  Headaches- take your usual medications as directed by your physician.  Increase your fluid intake.  Caffeinated beverages may be helpful.  Lie flat in bed until your headache resolves.  Restlessness or inability to sleep- you may have trouble sleeping for the next few days.  Ask your referring physician if you need any medication for sleep.  Facial flushing or redness- should subside within a few days.  Increased pain- a temporary increase in pain a day or two following your procedure is not unusual.  Take your pain medication as prescribed by your referring physician.  Leg cramps  Please contact our office at [phone] for the following symptoms: Fever greater than 100 degrees. Headaches unresolved with medication after 2-3 days. Increased swelling, pain, or redness at injection site.  May resume plavix immediately after procedure. May resume aspirin immediately after procedure. Thank you for visiting [facility] today.

## 2022-10-31 ENCOUNTER — Ambulatory Visit
Admission: RE | Admit: 2022-10-31 | Discharge: 2022-10-31 | Disposition: A | Discharge status: Home / Self Care | Payer: Medicare Other | Source: Ambulatory Visit | Attending: Sports Medicine | Admitting: Sports Medicine

## 2022-10-31 DIAGNOSIS — M79606 Pain in leg, unspecified: Secondary | ICD-10-CM | POA: Diagnosis not present

## 2022-10-31 DIAGNOSIS — M5459 Other low back pain: Secondary | ICD-10-CM | POA: Diagnosis not present

## 2022-10-31 DIAGNOSIS — M48061 Spinal stenosis, lumbar region without neurogenic claudication: Secondary | ICD-10-CM

## 2022-10-31 DIAGNOSIS — M47817 Spondylosis without myelopathy or radiculopathy, lumbosacral region: Secondary | ICD-10-CM | POA: Diagnosis not present

## 2022-10-31 MED ORDER — IOPAMIDOL (ISOVUE-M 200) INJECTION 41%
1.0000 mL | Freq: Once | INTRAMUSCULAR | Status: AC
  Administered 2022-10-31: 1 mL via EPIDURAL

## 2022-10-31 MED ORDER — METHYLPREDNISOLONE ACETATE 40 MG/ML INJ SUSP (RADIOLOG
80.0000 mg | Freq: Once | INTRAMUSCULAR | Status: AC
  Administered 2022-10-31: 80 mg via EPIDURAL

## 2022-11-04 ENCOUNTER — Other Ambulatory Visit: Payer: Self-pay | Admitting: Family Medicine

## 2022-11-08 ENCOUNTER — Ambulatory Visit (INDEPENDENT_AMBULATORY_CARE_PROVIDER_SITE_OTHER): Payer: Medicare Other | Admitting: Podiatry

## 2022-11-08 ENCOUNTER — Encounter: Payer: Self-pay | Admitting: Family Medicine

## 2022-11-08 DIAGNOSIS — M79675 Pain in left toe(s): Secondary | ICD-10-CM

## 2022-11-08 DIAGNOSIS — Z7901 Long term (current) use of anticoagulants: Secondary | ICD-10-CM

## 2022-11-08 DIAGNOSIS — L84 Corns and callosities: Secondary | ICD-10-CM | POA: Diagnosis not present

## 2022-11-08 DIAGNOSIS — M79674 Pain in right toe(s): Secondary | ICD-10-CM | POA: Diagnosis not present

## 2022-11-08 DIAGNOSIS — B351 Tinea unguium: Secondary | ICD-10-CM

## 2022-11-08 DIAGNOSIS — E1149 Type 2 diabetes mellitus with other diabetic neurological complication: Secondary | ICD-10-CM

## 2022-11-08 MED ORDER — TRULICITY 3 MG/0.5ML ~~LOC~~ SOAJ: 3.0000 mg | SUBCUTANEOUS | 1 refills | Status: DC

## 2022-11-09 MED ORDER — ZEPBOUND 7.5 MG/0.5ML ~~LOC~~ SOAJ: 7.5000 mg | SUBCUTANEOUS | 0 refills | Status: DC

## 2022-11-09 NOTE — Telephone Encounter (Signed)
Meds ordered this encounter  Medications   DISCONTD: Dulaglutide (TRULICITY) 3 MG/0.5ML SOPN    Sig: Inject 3 mg into the skin once a week.    Dispense:  6 mL    Refill:  1   tirzepatide (ZEPBOUND) 7.5 MG/0.5ML Pen    Sig: Inject 7.5 mg into the skin once a week.    Dispense:  6 mL    Refill:  0

## 2022-11-13 ENCOUNTER — Encounter: Payer: Self-pay | Admitting: Sports Medicine

## 2022-11-13 NOTE — Progress Notes (Signed)
Subjective: Chief Complaint  Patient presents with   Nail Problem    Diabetic Foot Care    Callouses    Callus trim to right foot lateral aspect of foot near the 5th toe.      72 y.o. returns the office today for painful, elongated, thickened toenails which they cannot trim herself.  She denies any open sores.  She also has calluses to her feet.  No open lesions.  PCP: Agapito Games, MD Last seen 09/10/2022 A1c: 8 on 09/10/2022  Objective: AAO 3, NAD DP/PT pulses palpable, CRT less than 3 seconds Protective sensation decreased with Simms Weinstein monofilament Nails hypertrophic, dystrophic, elongated, brittle, discolored 10. There is tenderness overlying the nails 1-5 bilaterally. There is no surrounding erythema or drainage along the nail sites. Hyperkeratotic lesion right foot submetatarsal 5, metatarsal base as well as the heel.  No underlying ulceration drainage or any signs of infection noted today. Decrease in arch upon weightbearing.  No open lesions or other pre-ulcerative lesions are identified. No pain with calf compression, swelling, warmth, erythema.  Assessment: Patient presents with symptomatic onychomycosis, hyperkeratotic lesions  Plan: -Treatment options including alternatives, risks, complications were discussed -Nails sharply debrided 10 without complication/bleeding. -Hyperkeratotic lesion sharply debrided x 4 without any complications or bleeding.  Recommend moisturizer daily.  -Continue offloading inserts. -Discussed daily foot inspection. If there are any changes, to call the office immediately.  -Follow-up in 3 months or sooner if any problems are to arise. In the meantime, encouraged to call the office with any questions, concerns, changes symptoms.  Ovid Curd, DPM

## 2022-12-03 DIAGNOSIS — H40023 Open angle with borderline findings, high risk, bilateral: Secondary | ICD-10-CM | POA: Diagnosis not present

## 2022-12-03 DIAGNOSIS — H526 Other disorders of refraction: Secondary | ICD-10-CM | POA: Diagnosis not present

## 2022-12-03 DIAGNOSIS — H35351 Cystoid macular degeneration, right eye: Secondary | ICD-10-CM | POA: Diagnosis not present

## 2022-12-03 DIAGNOSIS — E119 Type 2 diabetes mellitus without complications: Secondary | ICD-10-CM | POA: Diagnosis not present

## 2022-12-07 ENCOUNTER — Other Ambulatory Visit: Payer: Self-pay | Admitting: *Deleted

## 2022-12-07 DIAGNOSIS — M2550 Pain in unspecified joint: Secondary | ICD-10-CM

## 2022-12-07 DIAGNOSIS — M5416 Radiculopathy, lumbar region: Secondary | ICD-10-CM

## 2022-12-07 MED ORDER — DULOXETINE HCL 30 MG PO CPEP
30.0000 mg | ORAL_CAPSULE | Freq: Every day | ORAL | 1 refills | Status: DC
Start: 2022-12-07 — End: 2023-06-21

## 2022-12-12 DIAGNOSIS — H43811 Vitreous degeneration, right eye: Secondary | ICD-10-CM | POA: Diagnosis not present

## 2022-12-12 DIAGNOSIS — E113213 Type 2 diabetes mellitus with mild nonproliferative diabetic retinopathy with macular edema, bilateral: Secondary | ICD-10-CM | POA: Diagnosis not present

## 2022-12-12 DIAGNOSIS — H20021 Recurrent acute iridocyclitis, right eye: Secondary | ICD-10-CM | POA: Diagnosis not present

## 2022-12-17 ENCOUNTER — Ambulatory Visit (INDEPENDENT_AMBULATORY_CARE_PROVIDER_SITE_OTHER): Payer: Medicare Other | Admitting: Sports Medicine

## 2022-12-17 ENCOUNTER — Other Ambulatory Visit (INDEPENDENT_AMBULATORY_CARE_PROVIDER_SITE_OTHER): Payer: Medicare Other

## 2022-12-17 DIAGNOSIS — M170 Bilateral primary osteoarthritis of knee: Secondary | ICD-10-CM

## 2022-12-17 DIAGNOSIS — H20041 Secondary noninfectious iridocyclitis, right eye: Secondary | ICD-10-CM | POA: Diagnosis not present

## 2022-12-17 DIAGNOSIS — H44111 Panuveitis, right eye: Secondary | ICD-10-CM | POA: Diagnosis not present

## 2022-12-17 MED ORDER — MELOXICAM 15 MG PO TABS
15.0000 mg | ORAL_TABLET | Freq: Every day | ORAL | 11 refills | Status: DC | PRN
Start: 2022-12-17 — End: 2023-01-21

## 2022-12-17 NOTE — Assessment & Plan Note (Addendum)
Ly returns, she has had bilateral knee injections, the left knee was done about 2 months ago. Too soon to do additional steroid injections so we will do bilateral Orthovisc No. 1 today, return in 1 week for #2 of 4 both knees. We will also add formal physical therapy and refill her meloxicam.

## 2022-12-17 NOTE — Progress Notes (Signed)
    Procedures performed today:    Procedure: Real-time Ultrasound Guided injection of the left knee Device: Samsung HS60  Verbal informed consent obtained.  Time-out conducted.  Noted no overlying erythema, induration, or other signs of local infection.  Skin prepped in a sterile fashion.  Local anesthesia: Topical Ethyl chloride.  With sterile technique and under real time ultrasound guidance: Noted trace effusion, 30 mg/2 mL of OrthoVisc (sodium hyaluronate) in a prefilled syringe was injected easily into the knee through a 22-gauge needle.   Completed without difficulty  Advised to call if fevers/chills, erythema, induration, drainage, or persistent bleeding.  Images permanently stored and available for review in PACS.  Impression: Technically successful ultrasound guided injection.  Procedure: Real-time Ultrasound Guided injection of the right knee Device: Samsung HS60  Verbal informed consent obtained.  Time-out conducted.  Noted no overlying erythema, induration, or other signs of local infection.  Skin prepped in a sterile fashion.  Local anesthesia: Topical Ethyl chloride.  With sterile technique and under real time ultrasound guidance: Noted trace effusion, 30 mg/2 mL of OrthoVisc (sodium hyaluronate) in a prefilled syringe was injected easily into the knee through a 22-gauge needle.   Completed without difficulty  Advised to call if fevers/chills, erythema, induration, drainage, or persistent bleeding.  Images permanently stored and available for review in PACS.  Impression: Technically successful ultrasound guided injection.  Independent interpretation of notes and tests performed by another provider:   None.  Brief History, Exam, Impression, and Recommendations:    Primary osteoarthritis of both knees Natalie Wright returns, she has had bilateral knee injections, the left knee was done about 2 months ago. Too soon to do additional steroid injections so we will do  bilateral Orthovisc No. 1 today, return in 1 week for #2 of 4 both knees. We will also add formal physical therapy and refill her meloxicam.    ____________________________________________ Ihor Austin. Benjamin Stain, M.D., ABFM., CAQSM., AME. Primary Care and Sports Medicine [facility]  Adjunct Professor of Family Medicine  [place] [employer] [facility] [employer]  Restaurant manager, fast food

## 2022-12-25 ENCOUNTER — Other Ambulatory Visit (INDEPENDENT_AMBULATORY_CARE_PROVIDER_SITE_OTHER): Payer: Medicare Other

## 2022-12-25 ENCOUNTER — Ambulatory Visit (INDEPENDENT_AMBULATORY_CARE_PROVIDER_SITE_OTHER): Payer: Medicare Other | Admitting: Sports Medicine

## 2022-12-25 DIAGNOSIS — M170 Bilateral primary osteoarthritis of knee: Secondary | ICD-10-CM

## 2022-12-25 NOTE — Assessment & Plan Note (Signed)
Orthovisc No. 2 of 4 both knees, return in 1 week for #3 of 4, did physical therapy and meloxicam at the last visit.

## 2022-12-25 NOTE — Progress Notes (Signed)
    Procedures performed today:    Procedure: Real-time Ultrasound Guided injection of the left knee Device: Samsung HS60  Verbal informed consent obtained.  Time-out conducted.  Noted no overlying erythema, induration, or other signs of local infection.  Skin prepped in a sterile fashion.  Local anesthesia: Topical Ethyl chloride.  With sterile technique and under real time ultrasound guidance: Noted trace effusion, 30 mg/2 mL of OrthoVisc (sodium hyaluronate) in a prefilled syringe was injected easily into the knee through a 22-gauge needle.   Completed without difficulty  Advised to call if fevers/chills, erythema, induration, drainage, or persistent bleeding.  Images permanently stored and available for review in PACS.  Impression: Technically successful ultrasound guided injection.   Procedure: Real-time Ultrasound Guided injection of the right knee Device: Samsung HS60  Verbal informed consent obtained.  Time-out conducted.  Noted no overlying erythema, induration, or other signs of local infection.  Skin prepped in a sterile fashion.  Local anesthesia: Topical Ethyl chloride.  With sterile technique and under real time ultrasound guidance: Noted trace effusion, 30 mg/2 mL of OrthoVisc (sodium hyaluronate) in a prefilled syringe was injected easily into the knee through a 22-gauge needle.   Completed without difficulty  Advised to call if fevers/chills, erythema, induration, drainage, or persistent bleeding.  Images permanently stored and available for review in PACS.  Impression: Technically successful ultrasound guided injection.  Independent interpretation of notes and tests performed by another provider:   None.  Brief History, Exam, Impression, and Recommendations:    Primary osteoarthritis of both knees Orthovisc No. 2 of 4 both knees, return in 1 week for #3 of 4, did physical therapy and meloxicam at the last  visit.    ____________________________________________ Ihor Austin. Benjamin Stain, M.D., ABFM., CAQSM., AME. Primary Care and Sports Medicine [facility]  Adjunct Professor of Family Medicine  [place] [employer] [facility] [employer]  Restaurant manager, fast food

## 2022-12-26 DIAGNOSIS — H20021 Recurrent acute iridocyclitis, right eye: Secondary | ICD-10-CM | POA: Diagnosis not present

## 2022-12-26 DIAGNOSIS — E113213 Type 2 diabetes mellitus with mild nonproliferative diabetic retinopathy with macular edema, bilateral: Secondary | ICD-10-CM | POA: Diagnosis not present

## 2022-12-26 DIAGNOSIS — H43811 Vitreous degeneration, right eye: Secondary | ICD-10-CM | POA: Diagnosis not present

## 2022-12-27 ENCOUNTER — Ambulatory Visit: Payer: Medicare Other | Admitting: Family Medicine

## 2022-12-27 DIAGNOSIS — K8681 Exocrine pancreatic insufficiency: Secondary | ICD-10-CM | POA: Diagnosis not present

## 2022-12-27 DIAGNOSIS — K219 Gastro-esophageal reflux disease without esophagitis: Secondary | ICD-10-CM | POA: Diagnosis not present

## 2022-12-27 DIAGNOSIS — K209 Esophagitis, unspecified without bleeding: Secondary | ICD-10-CM | POA: Diagnosis not present

## 2022-12-31 ENCOUNTER — Ambulatory Visit: Payer: Medicare Other | Attending: Sports Medicine | Admitting: Rehabilitative and Restorative Service Providers"

## 2022-12-31 ENCOUNTER — Encounter: Payer: Self-pay | Admitting: Rehabilitative and Restorative Service Providers"

## 2022-12-31 ENCOUNTER — Other Ambulatory Visit: Payer: Self-pay

## 2022-12-31 DIAGNOSIS — M25561 Pain in right knee: Secondary | ICD-10-CM | POA: Diagnosis present

## 2022-12-31 DIAGNOSIS — G8929 Other chronic pain: Secondary | ICD-10-CM | POA: Diagnosis present

## 2022-12-31 DIAGNOSIS — M25562 Pain in left knee: Secondary | ICD-10-CM | POA: Diagnosis present

## 2022-12-31 DIAGNOSIS — M170 Bilateral primary osteoarthritis of knee: Secondary | ICD-10-CM | POA: Insufficient documentation

## 2022-12-31 DIAGNOSIS — R2689 Other abnormalities of gait and mobility: Secondary | ICD-10-CM | POA: Insufficient documentation

## 2022-12-31 DIAGNOSIS — M6281 Muscle weakness (generalized): Secondary | ICD-10-CM | POA: Diagnosis not present

## 2022-12-31 NOTE — Therapy (Signed)
OUTPATIENT PHYSICAL THERAPY LOWER EXTREMITY EVALUATION   Patient Name: Natalie Wright MRN: [id number] DOB:Dec 17, 1950, 72 y.o., female Today's Date: 12/31/2022  END OF SESSION:  PT End of Session - 12/31/22 1721     Visit Number 1    Number of Visits 16    Date for PT Re-Evaluation 03/01/23    Authorization Type medicare and tricare    Progress Note Due on Visit 10    PT Start Time 1410    PT Stop Time 1452    PT Time Calculation (min) 42 min    Activity Tolerance Patient tolerated treatment well    Behavior During Therapy WFL for tasks assessed/performed            Past Medical History:  Diagnosis Date   Allergy    to cats and dogs   Diabetes mellitus    Hiatal hernia    and delayed gastric emptying/ sees [facility] for recurrent diarrhea   History of cardiovascular disorder 12/10/2008   Qualifier: Diagnosis of  By: Linford Arnold MD, Catherine     Hypertension    OSA (obstructive sleep apnea)    CPAP   Pedal edema    Spastic bladder    urology in [place]   TIA (transient ischemic attack)    hx of- sees Dr Rayburn Ma (Neurology)   Past Surgical History:  Procedure Laterality Date   CATARACT EXTRACTION  08/2021   vaginal skin tag removal     Patient Active Problem List   Diagnosis Date Noted   Lumbar spondylosis 07/03/2022   Lumbar spinal stenosis 02/26/2022   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/2022   Bilateral hip pain 01/12/2022   Hearing loss due to cerumen impaction, right 01/12/2022   Hammertoe, bilateral 08/23/2021   History of foot ulcer 08/23/2021   Primary osteoarthritis of both knees 12/09/2020   History of burning pain in leg 08/29/2020   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   Tremor 05/05/2020   Pre-ulcerative corn or callous 01/29/2020   History of transient ischemic attack (TIA) 05/15/2018   Family history of ovarian cancer 04/02/2017   Aortic atherosclerosis (HCC) 08/31/2016   Venous stasis 08/10/2016    Palpitations 07/16/2016   Atrial dilatation, left 07/16/2016   Atrial septal aneurysm 07/16/2016   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   Cataract 04/18/2012   Hyperlipidemia 02/13/2011   Pulmonary nodules 09/22/2010   LEG PAIN 09/12/2009   Sleep apnea 04/13/2009   GERD 03/14/2009   Morbid obesity (HCC) 12/10/2008   Essential hypertension, benign 12/10/2008   RAYNAUD'S DISEASE 12/10/2008   OSTEOARTHRITIS 12/10/2008   URINARY INCONTINENCE 12/10/2008    PCP: [name], MD REFERRING PROVIDER: [name], MD REFERRING DIAG:  M17.0 (ICD-10-CM) - Primary osteoarthritis of both knees   THERAPY DIAG:  Muscle weakness (generalized)  Other abnormalities of gait and mobility  Chronic pain of right knee  Chronic pain of left knee  Rationale for Evaluation and Treatment: Rehabilitation  ONSET DATE: 12/17/22  SUBJECTIVE:  SUBJECTIVE STATEMENT: The patient has pain in bilateral knees and spine. She has had a couple of falls after slipping in water. The patient notes that she needs knee replacements, but is working to lose weight before she can be considered for TKRs. She uses a quad cane for ambulation that switches between R and L hands.  PERTINENT HISTORY: Diabetes, HTN, TIA, spinal stenosis, polymyalgia rheumatica PAIN:  Are you having pain? Yes: NPRS scale: 7/10 Pain location: back and knees Pain description: "snap, crackle,  pop" pain, instability at knees where they give out, and neuropathy in her feet Aggravating factors: trying to move after standing, pivoting to step  Relieving factors: nothing *pain also down R hip into thigh  PRECAUTIONS: Fall  WEIGHT BEARING RESTRICTIONS: No  FALLS:  Has patient fallen in last 6 months? Yes. Number of falls 1  LIVING ENVIRONMENT: Lives with: lives alone spouse passed away 05/14/22 Lives in: House/apartment Stairs: No Has following equipment at home: Quad cane large base and shower bench, grab bars, scooter (uses  husband's scooter)  PLOF:  declining   mobility  PATIENT GOALS: "I'm not sure. I don't know how much therapy will help before knee surgery."  Be able to get in and out of her car easier-- right now, it is so slow and a long process.   OBJECTIVE:   PATIENT SURVEYS:  FOTO n/a-- mobility focus  SENSATION: Notes neuropathy with some burning in feet, dec'd sensation  EDEMA:  Bilateral swelling noted in LEs  LOWER EXTREMITY ROM: Active ROM Right eval Left eval  Hip flexion    Hip extension    Hip abduction    Hip adduction    Hip internal rotation    Hip external rotation    Knee flexion 110 110  Knee extension 0 0  Ankle dorsiflexion    Ankle plantarflexion    Ankle inversion    Ankle eversion     (Blank rows = not tested)  LOWER EXTREMITY MMT: *can lift against gravity MMT Right eval Left eval  Hip flexion 3/5 3/5  Hip extension    Hip abduction    Hip adduction    Hip internal rotation    Hip external rotation    Knee flexion 3/5 3/5  Knee extension 3/5 3/5  Ankle dorsiflexion 4/5 4/5  Ankle plantarflexion 3/5 3/5  Ankle inversion    Ankle eversion     (Blank rows = not tested)  FUNCTIONAL TESTS:  5 time sit to stand=56.61  Gait speed=0.302 ft/sec *significantly slowed speed*  GAIT: Distance walked: with [facility] slowed pace x 75 feet requiring increased time Assistive device utilized: Quad cane large base Level of assistance: Modified independence  slowed pace Comments: patient's quad cane is too high and she switches R and L sides-- PT to address Shoewear: Patient has to use slip on shoes and these slide during gait activities-- PT to address  Bed mobility: Independent climbing into high bed and rolling into bed  [facility] Adult PT Treatment:                                                DATE: 12/31/22 Therapeutic Exercise: See HEP below  PATIENT EDUCATION:  Education details: HEP, plan of care Person educated: Patient Education method: Explanation,  Demonstration, and Handouts Education comprehension: verbalized understanding, returned demonstration, and needs further education  HOME EXERCISE PROGRAM: Access Code: [email or web address] URL: [email or web address] Date: 12/31/2022 Prepared by: Margretta Ditty  Exercises - Supine Heel Slide  - 1 x daily - 7 x weekly - 1 sets - 10 reps - Supine Active Straight Leg Raise  - 1 x daily - 7 x weekly - 1 sets - 10 reps - Bent Knee Fallouts  - 1 x daily - 7 x weekly - 1 sets - 10 reps - Seated Long Arc Quad  - 1 x daily - 7 x  weekly - 1 sets - 10 reps - Standing Gluteal Sets  - 1 x daily - 7 x weekly - 1 sets - 10 reps - Sit to Stand with Armchair  - 1 x daily - 7 x weekly - 1 sets - 5 reps  ASSESSMENT:  CLINICAL IMPRESSION: Patient is a 72 y.o. female who was seen today for physical therapy evaluation and treatment for bilateral knee pain and decreased mobility. She presents with impairments in bilateral LE strength, gait speed, transfers, balance, and she has pain limiting function. PT to address mobility issues and work to increase tolerance to exercise and daily tasks.    OBJECTIVE IMPAIRMENTS: Abnormal gait, decreased activity tolerance, decreased balance, decreased mobility, decreased strength, impaired flexibility, obesity, and pain.   ACTIVITY LIMITATIONS: lifting, bending, standing, squatting, transfers, and locomotion level  PARTICIPATION LIMITATIONS: cleaning and community activity  PERSONAL FACTORS: 3+ comorbidities: obesity, diabetes, HTN, spinal stenosis  are also affecting patient's functional outcome.   REHAB POTENTIAL: Good  CLINICAL DECISION MAKING: Stable/uncomplicated  EVALUATION COMPLEXITY: Low   GOALS: Goals reviewed with patient? Yes  SHORT TERM GOALS: Target date: 01/31/23 The patient will be independent with HEP. Baseline: initiated at eval Goal status: INITIAL  2.  The patient will improve gait speed to > or equal to 0.5 ft/sec. Baseline: 0.302  ft/sec  Goal status: INITIAL  3.  The patient will reduce 5 time sit to stand to < or equal to 48 seconds. Baseline: 56.61 Goal status: INITIAL  LONG TERM GOALS: Target date: 8 weeks 03/01/23  The patient will be indep with progression of HEP. Baseline: initiated at eval. Goal status: INITIAL  2.  The patient will demo correct use of assistive device. Baseline:  switches hands, needs height adjustment and more instruction Goal status: INITIAL  3.  The patient will improve gait speed to > or equal to 0.8 ft/sec Baseline:  0.302 ft/sec Goal status: INITIAL   4.  The patient will perform 5 time sit<>stand in < or equal to 40 seconds. Baseline: 56.61 seconds Goal status: INITIAL  5.  The patient will report ability to get out of her car and walk into clinic in < or equal to 10 minutes. Baseline:  Reports it takes extensive time to complete these tasks Goal status: INITIAL  PLAN:  PT FREQUENCY: 2x/week  PT DURATION: 8 weeks  PLANNED INTERVENTIONS: Therapeutic exercises, Therapeutic activity, Neuromuscular re-education, Balance training, Gait training, Patient/Family education, Self Care, and Joint mobilization  PLAN FOR NEXT SESSION: check on HEP, work on gait -- adjust height of quad cane, discuss one hand use of quad cane if able (less rocking it if possible), potentially try a RW or rollator for improved mobility, address shoewear if able (needs slip ons at this time)   Renada Cronin, PT 12/31/2022, 5:22 PM

## 2023-01-01 ENCOUNTER — Ambulatory Visit (INDEPENDENT_AMBULATORY_CARE_PROVIDER_SITE_OTHER): Payer: Medicare Other | Admitting: Sports Medicine

## 2023-01-01 ENCOUNTER — Other Ambulatory Visit (INDEPENDENT_AMBULATORY_CARE_PROVIDER_SITE_OTHER): Payer: Medicare Other

## 2023-01-01 DIAGNOSIS — M170 Bilateral primary osteoarthritis of knee: Secondary | ICD-10-CM | POA: Diagnosis not present

## 2023-01-01 NOTE — Progress Notes (Signed)
    Procedures performed today:    Procedure: Real-time Ultrasound Guided injection of the left knee Device: Samsung HS60  Verbal informed consent obtained.  Time-out conducted.  Noted no overlying erythema, induration, or other signs of local infection.  Skin prepped in a sterile fashion.  Local anesthesia: Topical Ethyl chloride.  With sterile technique and under real time ultrasound guidance: Noted trace effusion, 30 mg/2 mL of OrthoVisc (sodium hyaluronate) in a prefilled syringe was injected easily into the knee through a 22-gauge needle.   Completed without difficulty  Advised to call if fevers/chills, erythema, induration, drainage, or persistent bleeding.  Images permanently stored and available for review in PACS.  Impression: Technically successful ultrasound guided injection.   Procedure: Real-time Ultrasound Guided injection of the right knee Device: Samsung HS60  Verbal informed consent obtained.  Time-out conducted.  Noted no overlying erythema, induration, or other signs of local infection.  Skin prepped in a sterile fashion.  Local anesthesia: Topical Ethyl chloride.  With sterile technique and under real time ultrasound guidance: Noted trace effusion, 30 mg/2 mL of OrthoVisc (sodium hyaluronate) in a prefilled syringe was injected easily into the knee through a 22-gauge needle.   Completed without difficulty  Advised to call if fevers/chills, erythema, induration, drainage, or persistent bleeding.  Images permanently stored and available for review in PACS.  Impression: Technically successful ultrasound guided injection.  Independent interpretation of notes and tests performed by another provider:   None.  Brief History, Exam, Impression, and Recommendations:    Primary osteoarthritis of both knees Orthovisc 3 of 4 both knees, return in 1 week for #4 of 4. We have typically done the injections with the patient sitting in her  wheelchair.    ____________________________________________ Ihor Austin. Benjamin Stain, M.D., ABFM., CAQSM., AME. Primary Care and Sports Medicine [facility]  Adjunct Professor of Family Medicine  [place] [employer]  Restaurant manager, fast food

## 2023-01-01 NOTE — Assessment & Plan Note (Signed)
Orthovisc 3 of 4 both knees, return in 1 week for #4 of 4. We have typically done the injections with the patient sitting in her wheelchair.

## 2023-01-08 ENCOUNTER — Other Ambulatory Visit (INDEPENDENT_AMBULATORY_CARE_PROVIDER_SITE_OTHER): Payer: Medicare Other

## 2023-01-08 ENCOUNTER — Ambulatory Visit (INDEPENDENT_AMBULATORY_CARE_PROVIDER_SITE_OTHER): Payer: Medicare Other | Admitting: Sports Medicine

## 2023-01-08 DIAGNOSIS — M170 Bilateral primary osteoarthritis of knee: Secondary | ICD-10-CM | POA: Diagnosis not present

## 2023-01-08 NOTE — Assessment & Plan Note (Signed)
Orthovisc 4 of 4 both knees, return to see me 6 weeks as needed. If insufficient improvement we will refer for genicular artery embolization.

## 2023-01-08 NOTE — Progress Notes (Signed)
    Procedures performed today:    Procedure: Real-time Ultrasound Guided injection of the left knee Device: Samsung HS60  Verbal informed consent obtained.  Time-out conducted.  Noted no overlying erythema, induration, or other signs of local infection.  Skin prepped in a sterile fashion.  Local anesthesia: Topical Ethyl chloride.  With sterile technique and under real time ultrasound guidance: Noted trace effusion, 30 mg/2 mL of OrthoVisc (sodium hyaluronate) in a prefilled syringe was injected easily into the knee through a 22-gauge needle.   Completed without difficulty  Advised to call if fevers/chills, erythema, induration, drainage, or persistent bleeding.  Images permanently stored and available for review in PACS.  Impression: Technically successful ultrasound guided injection.   Procedure: Real-time Ultrasound Guided aspiration/injection of the right knee Device: Samsung HS60  Verbal informed consent obtained.  Time-out conducted.  Noted no overlying erythema, induration, or other signs of local infection.  Skin prepped in a sterile fashion.  Local anesthesia: Topical Ethyl chloride.  With sterile technique and under real time ultrasound guidance: Noted moderate effusion, using 18-gauge needle aspirated approximately 10 to 15 mL of clear, straw-colored fluid, syringe switched and 30 mg/2 mL of OrthoVisc (sodium hyaluronate) in a prefilled syringe was injected easily.   Completed without difficulty  Advised to call if fevers/chills, erythema, induration, drainage, or persistent bleeding.  Images permanently stored and available for review in PACS.  Impression: Technically successful ultrasound guided aspiration/injection.  Independent interpretation of notes and tests performed by another provider:   None.  Brief History, Exam, Impression, and Recommendations:    Primary osteoarthritis of both knees Orthovisc 4 of 4 both knees, return to see me 6 weeks as needed. If  insufficient improvement we will refer for genicular artery embolization.    ____________________________________________ Ihor Austin. Benjamin Stain, M.D., ABFM., CAQSM., AME. Primary Care and Sports Medicine [facility]  Adjunct Professor of Family Medicine  [place] [employer]  Restaurant manager, fast food

## 2023-01-15 ENCOUNTER — Ambulatory Visit: Payer: Medicare Other

## 2023-01-15 DIAGNOSIS — M6281 Muscle weakness (generalized): Secondary | ICD-10-CM

## 2023-01-15 DIAGNOSIS — M170 Bilateral primary osteoarthritis of knee: Secondary | ICD-10-CM | POA: Diagnosis not present

## 2023-01-15 DIAGNOSIS — R2689 Other abnormalities of gait and mobility: Secondary | ICD-10-CM

## 2023-01-15 DIAGNOSIS — G8929 Other chronic pain: Secondary | ICD-10-CM

## 2023-01-15 DIAGNOSIS — M25561 Pain in right knee: Secondary | ICD-10-CM | POA: Diagnosis not present

## 2023-01-15 DIAGNOSIS — M25562 Pain in left knee: Secondary | ICD-10-CM | POA: Diagnosis not present

## 2023-01-15 NOTE — Therapy (Signed)
OUTPATIENT PHYSICAL THERAPY LOWER EXTREMITY TREATMENT   Patient Name: Natalie Wright MRN: [id number] DOB:[birth year], 72 y.o., female Today's Date: 01/15/2023  END OF SESSION:  PT End of Session - 01/15/23 1058     Visit Number 2    Number of Visits 16    Date for PT Re-Evaluation 03/01/23    Authorization Type medicare and tricare    Progress Note Due on Visit 10    PT Start Time 1100    PT Stop Time 1140    PT Time Calculation (min) 40 min    Activity Tolerance Patient tolerated treatment well    Behavior During Therapy WFL for tasks assessed/performed            Past Medical History:  Diagnosis Date   Allergy    to cats and dogs   Diabetes mellitus    Hiatal hernia    and delayed gastric emptying/ sees [facility] for recurrent diarrhea   History of cardiovascular disorder 12/10/2008   Qualifier: Diagnosis of  By: Linford Arnold MD, Catherine     Hypertension    OSA (obstructive sleep apnea)    CPAP   Pedal edema    Spastic bladder    urology in [place]   TIA (transient ischemic attack)    hx of- sees Dr Rayburn Ma (Neurology)   Past Surgical History:  Procedure Laterality Date   CATARACT EXTRACTION  08/2021   vaginal skin tag removal     Patient Active Problem List   Diagnosis Date Noted   Lumbar spondylosis 07/03/2022   Lumbar spinal stenosis 02/26/2022   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/2022   Bilateral hip pain 01/12/2022   Hearing loss due to cerumen impaction, right 01/12/2022   Hammertoe, bilateral 08/23/2021   History of foot ulcer 08/23/2021   Primary osteoarthritis of both knees 12/09/2020   History of burning pain in leg 08/29/2020   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   Tremor 05/05/2020   Pre-ulcerative corn or callous 01/29/2020   History of transient ischemic attack (TIA) 05/15/2018   Family history of ovarian cancer 04/02/2017   Aortic atherosclerosis (HCC) 08/31/2016   Venous stasis 08/10/2016    Palpitations 07/16/2016   Atrial dilatation, left 07/16/2016   Atrial septal aneurysm 07/16/2016   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   Cataract 04/18/2012   Hyperlipidemia 02/13/2011   Pulmonary nodules 09/22/2010   LEG PAIN 09/12/2009   Sleep apnea 04/13/2009   GERD 03/14/2009   Morbid obesity (HCC) 12/10/2008   Essential hypertension, benign 12/10/2008   RAYNAUD'S DISEASE 12/10/2008   OSTEOARTHRITIS 12/10/2008   URINARY INCONTINENCE 12/10/2008    PCP: [name], MD REFERRING PROVIDER: [name], MD REFERRING DIAG:  M17.0 (ICD-10-CM) - Primary osteoarthritis of both knees   THERAPY DIAG:  Muscle weakness (generalized)  Other abnormalities of gait and mobility  Chronic pain of right knee  Chronic pain of left knee  Rationale for Evaluation and Treatment: Rehabilitation  ONSET DATE: 12/17/22  SUBJECTIVE:  SUBJECTIVE STATEMENT: Patient reports her knees still make a loud popping sound when walking, however the "slippage" she was feeling at the knee joint is better. Patient states she does not take pain medication due to making her very drowsy. Patient reports 5/10 pain in bilateral knees and low back. Patient received her final gel shot in bilateral knees.   PERTINENT HISTORY: Diabetes, HTN, TIA, spinal stenosis, polymyalgia rheumatica PAIN:  Are you having pain? Yes: NPRS scale: 7/10 Pain location: back and knees Pain  description: "snap, crackle, pop" pain, instability at knees where they give out, and neuropathy in her feet Aggravating factors: trying to move after standing, pivoting to step  Relieving factors: nothing *pain also down R hip into thigh  PRECAUTIONS: Fall  WEIGHT BEARING RESTRICTIONS: No  FALLS:  Has patient fallen in last 6 months? Yes. Number of falls 1  LIVING ENVIRONMENT: Lives with: lives alone spouse passed away 06/18/2022 Lives in: House/apartment Stairs: No Has following equipment at home: Quad cane large base and  shower bench, grab bars, scooter (uses husband's scooter)  PLOF:  declining   mobility  PATIENT GOALS: "I'm not sure. I don't know how much therapy will help before knee surgery."  Be able to get in and out of her car easier-- right now, it is so slow and a long process.   OBJECTIVE:   PATIENT SURVEYS:  FOTO n/a-- mobility focus  SENSATION: Notes neuropathy with some burning in feet, dec'd sensation  EDEMA:  Bilateral swelling noted in LEs  LOWER EXTREMITY ROM: Active ROM Right eval Left eval  Hip flexion    Hip extension    Hip abduction    Hip adduction    Hip internal rotation    Hip external rotation    Knee flexion 110 110  Knee extension 0 0  Ankle dorsiflexion    Ankle plantarflexion    Ankle inversion    Ankle eversion     (Blank rows = not tested)  LOWER EXTREMITY MMT: *can lift against gravity MMT Right eval Left eval  Hip flexion 3/5 3/5  Hip extension    Hip abduction    Hip adduction    Hip internal rotation    Hip external rotation    Knee flexion 3/5 3/5  Knee extension 3/5 3/5  Ankle dorsiflexion 4/5 4/5  Ankle plantarflexion 3/5 3/5  Ankle inversion    Ankle eversion     (Blank rows = not tested)  FUNCTIONAL TESTS:  5 time sit to stand=56.61  Gait speed=0.302 ft/sec *significantly slowed speed*  GAIT: Distance walked: with [facility] slowed pace x 75 feet requiring increased time Assistive device utilized: Quad cane large base Level of assistance: Modified independence  slowed pace Comments: patient's quad cane is too high and she switches R and L sides-- PT to address Shoewear: Patient has to use slip on shoes and these slide during gait activities-- PT to address  Bed mobility: Independent climbing into high bed and rolling into bed  [facility] Adult PT Treatment:                                                DATE: 01/15/2023 Therapeutic Exercise: Seated LAQs 3#AW 10x3" Supine heel slides x12  Hooklying small ball squeeze 10x3" Hooklying  knee press on gait belt (abd iso) 10x3" Supine abdominal bracing with coregeous ball  Bent knee fall outs x15  Seated glute set 10x5" Therapeutic Activity: Gait training with RW --> focus on turns and directional changes leading with feet to decrease trunk rotation & twisting on knee Self Care: Adjustment of WBQC & safe placement of AD --> discussing using FWW over [facility] for more support     [facility] Adult PT Treatment:  DATE: 12/31/22 Therapeutic Exercise: See HEP below  PATIENT EDUCATION:  Education details: HEP, plan of care Person educated: Patient Education method: Explanation, Demonstration, and Handouts Education comprehension: verbalized understanding, returned demonstration, and needs further education  HOME EXERCISE PROGRAM: Access Code: [email or web address] URL: [email or web address] Date: 12/31/2022 Prepared by: Margretta Ditty  Exercises - Supine Heel Slide  - 1 x daily - 7 x weekly - 1 sets - 10 reps - Supine Active Straight Leg Raise  - 1 x daily - 7 x weekly - 1 sets - 10 reps - Bent Knee Fallouts  - 1 x daily - 7 x weekly - 1 sets - 10 reps - Seated Long Arc Quad  - 1 x daily - 7 x weekly - 1 sets - 10 reps - Standing Gluteal Sets  - 1 x daily - 7 x weekly - 1 sets - 10 reps - Sit to Stand with Armchair  - 1 x daily - 7 x weekly - 1 sets - 5 reps  ASSESSMENT:  CLINICAL IMPRESSION: Instruction with patient on safe AD placement and navigation of WBQC; WBQC and FWW adjusted to proper height for patient. Gait training performed with FWW, focus on postural alignment and navigating turns by leading with feet instead of rotation trunk and twisting at knees.    OBJECTIVE IMPAIRMENTS: Abnormal gait, decreased activity tolerance, decreased balance, decreased mobility, decreased strength, impaired flexibility, obesity, and pain.   ACTIVITY LIMITATIONS: lifting, bending, standing, squatting, transfers, and locomotion  level  PARTICIPATION LIMITATIONS: cleaning and community activity  PERSONAL FACTORS: 3+ comorbidities: obesity, diabetes, HTN, spinal stenosis  are also affecting patient's functional outcome.   REHAB POTENTIAL: Good  CLINICAL DECISION MAKING: Stable/uncomplicated  EVALUATION COMPLEXITY: Low   GOALS: Goals reviewed with patient? Yes  SHORT TERM GOALS: Target date: 01/31/23 The patient will be independent with HEP. Baseline: initiated at eval Goal status: INITIAL  2.  The patient will improve gait speed to > or equal to 0.5 ft/sec. Baseline: 0.302 ft/sec  Goal status: INITIAL  3.  The patient will reduce 5 time sit to stand to < or equal to 48 seconds. Baseline: 56.61 Goal status: INITIAL  LONG TERM GOALS: Target date: 8 weeks 03/01/23  The patient will be indep with progression of HEP. Baseline: initiated at eval. Goal status: INITIAL  2.  The patient will demo correct use of assistive device. Baseline:  switches hands, needs height adjustment and more instruction Goal status: INITIAL  3.  The patient will improve gait speed to > or equal to 0.8 ft/sec Baseline:  0.302 ft/sec Goal status: INITIAL   4.  The patient will perform 5 time sit<>stand in < or equal to 40 seconds. Baseline: 56.61 seconds Goal status: INITIAL  5.  The patient will report ability to get out of her car and walk into clinic in < or equal to 10 minutes. Baseline:  Reports it takes extensive time to complete these tasks Goal status: INITIAL  PLAN:  PT FREQUENCY: 2x/week  PT DURATION: 8 weeks  PLANNED INTERVENTIONS: Therapeutic exercises, Therapeutic activity, Neuromuscular re-education, Balance training, Gait training, Patient/Family education, Self Care, and Joint mobilization  PLAN FOR NEXT SESSION: Adjust personal FWW if needed; address shoewear if able (needs slip ons at this time). Continue LE/core strengthening.   Sanjuana Mae, PTA 01/15/2023, 11:46 AM

## 2023-01-18 ENCOUNTER — Ambulatory Visit: Payer: Medicare Other

## 2023-01-18 DIAGNOSIS — G8929 Other chronic pain: Secondary | ICD-10-CM | POA: Diagnosis not present

## 2023-01-18 DIAGNOSIS — M6281 Muscle weakness (generalized): Secondary | ICD-10-CM | POA: Diagnosis not present

## 2023-01-18 DIAGNOSIS — M25562 Pain in left knee: Secondary | ICD-10-CM | POA: Diagnosis not present

## 2023-01-18 DIAGNOSIS — M25561 Pain in right knee: Secondary | ICD-10-CM | POA: Diagnosis not present

## 2023-01-18 DIAGNOSIS — M170 Bilateral primary osteoarthritis of knee: Secondary | ICD-10-CM | POA: Diagnosis not present

## 2023-01-18 DIAGNOSIS — R2689 Other abnormalities of gait and mobility: Secondary | ICD-10-CM | POA: Diagnosis not present

## 2023-01-18 NOTE — Therapy (Signed)
OUTPATIENT PHYSICAL THERAPY LOWER EXTREMITY TREATMENT   Patient Name: Natalie Wright MRN: [id number] DOB:August 27, 1950, 72 y.o., female Today's Date: 01/18/2023  END OF SESSION:  PT End of Session - 01/18/23 1012     Visit Number 3    Number of Visits 16    Date for PT Re-Evaluation 03/01/23    Authorization Type medicare and tricare    Authorization - Visit Number 3    Progress Note Due on Visit 10    PT Start Time 1015    PT Stop Time 1058    PT Time Calculation (min) 43 min    Activity Tolerance Patient tolerated treatment well    Behavior During Therapy WFL for tasks assessed/performed            Past Medical History:  Diagnosis Date   Allergy    to cats and dogs   Diabetes mellitus    Hiatal hernia    and delayed gastric emptying/ sees [facility] for recurrent diarrhea   History of cardiovascular disorder 12/10/2008   Qualifier: Diagnosis of  By: Linford Arnold MD, Catherine     Hypertension    OSA (obstructive sleep apnea)    CPAP   Pedal edema    Spastic bladder    urology in [place]   TIA (transient ischemic attack)    hx of- sees Dr Rayburn Ma (Neurology)   Past Surgical History:  Procedure Laterality Date   CATARACT EXTRACTION  08/2021   vaginal skin tag removal     Patient Active Problem List   Diagnosis Date Noted   Lumbar spondylosis 07/03/2022   Lumbar spinal stenosis 02/26/2022   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/2022   Bilateral hip pain 01/12/2022   Hearing loss due to cerumen impaction, right 01/12/2022   Hammertoe, bilateral 08/23/2021   History of foot ulcer 08/23/2021   Primary osteoarthritis of both knees 12/09/2020   History of burning pain in leg 08/29/2020   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   Tremor 05/05/2020   Pre-ulcerative corn or callous 01/29/2020   History of transient ischemic attack (TIA) 05/15/2018   Family history of ovarian cancer 04/02/2017   Aortic atherosclerosis (HCC) 08/31/2016    Venous stasis 08/10/2016   Palpitations 07/16/2016   Atrial dilatation, left 07/16/2016   Atrial septal aneurysm 07/16/2016   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   Cataract 04/18/2012   Hyperlipidemia 02/13/2011   Pulmonary nodules 09/22/2010   LEG PAIN 09/12/2009   Sleep apnea 04/13/2009   GERD 03/14/2009   Morbid obesity (HCC) 12/10/2008   Essential hypertension, benign 12/10/2008   RAYNAUD'S DISEASE 12/10/2008   OSTEOARTHRITIS 12/10/2008   URINARY INCONTINENCE 12/10/2008    PCP: [name], MD REFERRING PROVIDER: [name], MD REFERRING DIAG:  M17.0 (ICD-10-CM) - Primary osteoarthritis of both knees   THERAPY DIAG:  Muscle weakness (generalized)  Other abnormalities of gait and mobility  Chronic pain of right knee  Chronic pain of left knee  Rationale for Evaluation and Treatment: Rehabilitation  ONSET DATE: 12/17/22  SUBJECTIVE:  SUBJECTIVE STATEMENT: Patient arrived to session with FWW, states she feels more supported and finds herself leaning less when walking. Patient states 4/10 pain when walking today, states she continues to have "slipping" sensation in bilateral knee joints.    PERTINENT HISTORY: Diabetes, HTN, TIA, spinal stenosis, polymyalgia rheumatica PAIN:  Are you having pain? Yes: NPRS scale: 7/10 Pain location: back and knees Pain description: "snap, crackle, pop" pain, instability at knees where they give out,  and neuropathy in her feet Aggravating factors: trying to move after standing, pivoting to step  Relieving factors: nothing *pain also down R hip into thigh  PRECAUTIONS: Fall  WEIGHT BEARING RESTRICTIONS: No  FALLS:  Has patient fallen in last 6 months? Yes. Number of falls 1  LIVING ENVIRONMENT: Lives with: lives alone spouse passed away 24-May-2022 Lives in: House/apartment Stairs: No Has following equipment at home: Quad cane large base and shower bench, grab bars, scooter (uses husband's scooter)  PLOF:   declining   mobility  PATIENT GOALS: "I'm not sure. I don't know how much therapy will help before knee surgery."  Be able to get in and out of her car easier-- right now, it is so slow and a long process.   OBJECTIVE:   PATIENT SURVEYS:  FOTO n/a-- mobility focus  SENSATION: Notes neuropathy with some burning in feet, dec'd sensation  EDEMA:  Bilateral swelling noted in LEs  LOWER EXTREMITY ROM: Active ROM Right eval Left eval  Hip flexion    Hip extension    Hip abduction    Hip adduction    Hip internal rotation    Hip external rotation    Knee flexion 110 110  Knee extension 0 0  Ankle dorsiflexion    Ankle plantarflexion    Ankle inversion    Ankle eversion     (Blank rows = not tested)  LOWER EXTREMITY MMT: *can lift against gravity MMT Right eval Left eval  Hip flexion 3/5 3/5  Hip extension    Hip abduction    Hip adduction    Hip internal rotation    Hip external rotation    Knee flexion 3/5 3/5  Knee extension 3/5 3/5  Ankle dorsiflexion 4/5 4/5  Ankle plantarflexion 3/5 3/5  Ankle inversion    Ankle eversion     (Blank rows = not tested)  FUNCTIONAL TESTS:  5 time sit to stand=56.61  Gait speed=0.302 ft/sec *significantly slowed speed*  GAIT: Distance walked: with [facility] slowed pace x 75 feet requiring increased time Assistive device utilized: Quad cane large base Level of assistance: Modified independence  slowed pace Comments: patient's quad cane is too high and she switches R and L sides-- PT to address Shoewear: Patient has to use slip on shoes and these slide during gait activities-- PT to address  Bed mobility: Independent climbing into high bed and rolling into bed   [facility] Adult PT Treatment:                                                DATE: 01/18/2023 Therapeutic Exercise: Standing staggered stance (FWW) with anterior weight shifting + glute activation Bkwd walking FWW x10' Supine:  AROM heel slides x10 (B) Hooklying ball  squeeze 10x5" Hooklying knee press on gait belt (abd iso, ankle abd press on ball b/w ankles) 10x5" Active SLR x10 (B) Bent knee fall out x15 RTB  Unilateral clamshell (no resistance)  Self Care: Adjusting FWW; showing patient how to replace sliders on back legs   [facility] Adult PT Treatment:                                                DATE: 01/15/2023 Therapeutic Exercise: Seated  LAQs 3#AW 10x3" Supine heel slides x12  Hooklying small ball squeeze 10x3" Hooklying knee press on gait belt (abd iso) 10x3" Supine abdominal bracing with coregeous ball  Bent knee fall outs x15  Seated glute set 10x5" Therapeutic Activity: Gait training with RW --> focus on turns and directional changes leading with feet to decrease trunk rotation & twisting on knee Self Care: Adjustment of WBQC & safe placement of AD --> discussing using FWW over [facility] for more support    PATIENT EDUCATION:  Education details: HEP, plan of care Person educated: Patient Education method: Explanation, Demonstration, and Handouts Education comprehension: verbalized understanding, returned demonstration, and needs further education  HOME EXERCISE PROGRAM: Access Code: [email or web address] URL: [email or web address] Date: 12/31/2022 Prepared by: Margretta Ditty  Exercises - Supine Heel Slide  - 1 x daily - 7 x weekly - 1 sets - 10 reps - Supine Active Straight Leg Raise  - 1 x daily - 7 x weekly - 1 sets - 10 reps - Bent Knee Fallouts  - 1 x daily - 7 x weekly - 1 sets - 10 reps - Seated Long Arc Quad  - 1 x daily - 7 x weekly - 1 sets - 10 reps - Standing Gluteal Sets  - 1 x daily - 7 x weekly - 1 sets - 10 reps - Sit to Stand with Armchair  - 1 x daily - 7 x weekly - 1 sets - 5 reps  ASSESSMENT:  CLINICAL IMPRESSION: Increased knee pain and "popping" with back leg during forward weight shifting in staggered stance. Patient reported no knee pain when walking backwards. Hip and knee strengthening exercises  continued, adding light resistance with clamshells.   OBJECTIVE IMPAIRMENTS: Abnormal gait, decreased activity tolerance, decreased balance, decreased mobility, decreased strength, impaired flexibility, obesity, and pain.   ACTIVITY LIMITATIONS: lifting, bending, standing, squatting, transfers, and locomotion level  PARTICIPATION LIMITATIONS: cleaning and community activity  PERSONAL FACTORS: 3+ comorbidities: obesity, diabetes, HTN, spinal stenosis  are also affecting patient's functional outcome.   REHAB POTENTIAL: Good  CLINICAL DECISION MAKING: Stable/uncomplicated  EVALUATION COMPLEXITY: Low   GOALS: Goals reviewed with patient? Yes  SHORT TERM GOALS: Target date: 01/31/23 The patient will be independent with HEP. Baseline: initiated at eval Goal status: INITIAL  2.  The patient will improve gait speed to > or equal to 0.5 ft/sec. Baseline: 0.302 ft/sec  Goal status: INITIAL  3.  The patient will reduce 5 time sit to stand to < or equal to 48 seconds. Baseline: 56.61 Goal status: INITIAL  LONG TERM GOALS: Target date: 8 weeks 03/01/23  The patient will be indep with progression of HEP. Baseline: initiated at eval. Goal status: INITIAL  2.  The patient will demo correct use of assistive device. Baseline:  switches hands, needs height adjustment and more instruction Goal status: INITIAL  3.  The patient will improve gait speed to > or equal to 0.8 ft/sec Baseline:  0.302 ft/sec Goal status: INITIAL   4.  The patient will perform 5 time sit<>stand in < or equal to 40 seconds. Baseline: 56.61 seconds Goal status: INITIAL  5.  The patient will report ability to get out of her car and walk into clinic in < or equal to 10 minutes. Baseline:  Reports it takes extensive time to complete these tasks Goal status: INITIAL  PLAN:  PT FREQUENCY: 2x/week  PT DURATION: 8 weeks  PLANNED INTERVENTIONS: Therapeutic exercises, Therapeutic activity, Neuromuscular  re-education, Balance training, Gait training, Patient/Family education, Self  Care, and Joint mobilization  PLAN FOR NEXT SESSION:  Address shoewear if able (needs slip-ons at this time). Continue LE/core strengthening.   Sanjuana Mae, PTA 01/18/2023, 11:02 AM

## 2023-01-20 ENCOUNTER — Encounter: Payer: Self-pay | Admitting: Family Medicine

## 2023-01-20 ENCOUNTER — Encounter: Payer: Self-pay | Admitting: Sports Medicine

## 2023-01-20 DIAGNOSIS — M170 Bilateral primary osteoarthritis of knee: Secondary | ICD-10-CM

## 2023-01-20 DIAGNOSIS — K219 Gastro-esophageal reflux disease without esophagitis: Secondary | ICD-10-CM

## 2023-01-20 DIAGNOSIS — I10 Essential (primary) hypertension: Secondary | ICD-10-CM

## 2023-01-21 MED ORDER — MELOXICAM 15 MG PO TABS
15.0000 mg | ORAL_TABLET | Freq: Every day | ORAL | 2 refills | Status: DC | PRN
Start: 2023-01-21 — End: 2023-11-04

## 2023-01-21 MED ORDER — LISINOPRIL 20 MG PO TABS: 20.0000 mg | ORAL_TABLET | Freq: Every day | ORAL | 1 refills | Status: DC

## 2023-01-21 NOTE — Telephone Encounter (Signed)
Meds ordered this encounter  Medications   lisinopril (ZESTRIL) 20 MG tablet    Sig: Take 1 tablet (20 mg total) by mouth daily.    Dispense:  90 tablet    Refill:  1

## 2023-01-22 ENCOUNTER — Ambulatory Visit: Payer: Medicare Other

## 2023-01-22 DIAGNOSIS — R2689 Other abnormalities of gait and mobility: Secondary | ICD-10-CM | POA: Diagnosis not present

## 2023-01-22 DIAGNOSIS — M6281 Muscle weakness (generalized): Secondary | ICD-10-CM | POA: Diagnosis not present

## 2023-01-22 DIAGNOSIS — M25561 Pain in right knee: Secondary | ICD-10-CM | POA: Diagnosis not present

## 2023-01-22 DIAGNOSIS — G8929 Other chronic pain: Secondary | ICD-10-CM

## 2023-01-22 DIAGNOSIS — M170 Bilateral primary osteoarthritis of knee: Secondary | ICD-10-CM | POA: Diagnosis not present

## 2023-01-22 DIAGNOSIS — M25562 Pain in left knee: Secondary | ICD-10-CM | POA: Diagnosis not present

## 2023-01-22 MED ORDER — PANTOPRAZOLE SODIUM 40 MG PO TBEC
40.0000 mg | DELAYED_RELEASE_TABLET | Freq: Two times a day (BID) | ORAL | 1 refills | Status: DC
Start: 2023-01-22 — End: 2023-10-25

## 2023-01-22 NOTE — Therapy (Signed)
OUTPATIENT PHYSICAL THERAPY LOWER EXTREMITY TREATMENT   Patient Name: Natalie Wright MRN: [id number] DOB:[birth year], 72 y.o., female Today's Date: 01/22/2023  END OF SESSION:  PT End of Session - 01/22/23 1103     Visit Number 4    Number of Visits 16    Date for PT Re-Evaluation 03/01/23    Authorization Type medicare and tricare    Authorization - Visit Number 4    Progress Note Due on Visit 10    PT Start Time 1105    PT Stop Time 1145    PT Time Calculation (min) 40 min    Activity Tolerance Patient tolerated treatment well    Behavior During Therapy WFL for tasks assessed/performed            Past Medical History:  Diagnosis Date   Allergy    to cats and dogs   Diabetes mellitus    Hiatal hernia    and delayed gastric emptying/ sees [facility] for recurrent diarrhea   History of cardiovascular disorder 12/10/2008   Qualifier: Diagnosis of  By: Linford Arnold MD, Catherine     Hypertension    OSA (obstructive sleep apnea)    CPAP   Pedal edema    Spastic bladder    urology in [place]   TIA (transient ischemic attack)    hx of- sees Dr Rayburn Ma (Neurology)   Past Surgical History:  Procedure Laterality Date   CATARACT EXTRACTION  08/2021   vaginal skin tag removal     Patient Active Problem List   Diagnosis Date Noted   Lumbar spondylosis 07/03/2022   Lumbar spinal stenosis 02/26/2022   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/2022   Bilateral hip pain 01/12/2022   Hearing loss due to cerumen impaction, right 01/12/2022   Hammertoe, bilateral 08/23/2021   History of foot ulcer 08/23/2021   Primary osteoarthritis of both knees 12/09/2020   History of burning pain in leg 08/29/2020   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   Tremor 05/05/2020   Pre-ulcerative corn or callous 01/29/2020   History of transient ischemic attack (TIA) 05/15/2018   Family history of ovarian cancer 04/02/2017   Aortic atherosclerosis (HCC) 08/31/2016    Venous stasis 08/10/2016   Palpitations 07/16/2016   Atrial dilatation, left 07/16/2016   Atrial septal aneurysm 07/16/2016   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   Cataract 04/18/2012   Hyperlipidemia 02/13/2011   Pulmonary nodules 09/22/2010   LEG PAIN 09/12/2009   Sleep apnea 04/13/2009   GERD 03/14/2009   Morbid obesity (HCC) 12/10/2008   Essential hypertension, benign 12/10/2008   RAYNAUD'S DISEASE 12/10/2008   OSTEOARTHRITIS 12/10/2008   URINARY INCONTINENCE 12/10/2008    PCP: [name], MD REFERRING PROVIDER: [name], MD REFERRING DIAG:  M17.0 (ICD-10-CM) - Primary osteoarthritis of both knees   THERAPY DIAG:  Muscle weakness (generalized)  Other abnormalities of gait and mobility  Chronic pain of right knee  Chronic pain of left knee  Rationale for Evaluation and Treatment: Rehabilitation  ONSET DATE: 12/17/22  SUBJECTIVE:  SUBJECTIVE STATEMENT: Patient reports she is feeling nauseous today, states it something that happens periodically. Patient states she has been working on heel strike; state knees are still popping. Patient is compliant with HEP. Patient states she ordered new sliders for back legs of FWW.  PERTINENT HISTORY: Diabetes, HTN, TIA, spinal stenosis, polymyalgia rheumatica PAIN:  Are you having pain? Yes: NPRS scale: 6/10 Pain location: back and knees Pain description: "snap, crackle, pop" pain, instability at  knees where they give out, and neuropathy in her feet Aggravating factors: trying to move after standing, pivoting to step  Relieving factors: nothing *pain also down R hip into thigh  PRECAUTIONS: Fall  WEIGHT BEARING RESTRICTIONS: No  FALLS:  Has patient fallen in last 6 months? Yes. Number of falls 1  LIVING ENVIRONMENT: Lives with: lives alone spouse passed away 05/21/2022 Lives in: House/apartment Stairs: No Has following equipment at home: Quad cane large base and shower bench, grab bars, scooter (uses  husband's scooter)  PLOF:  declining   mobility  PATIENT GOALS: "I'm not sure. I don't know how much therapy will help before knee surgery."  Be able to get in and out of her car easier-- right now, it is so slow and a long process.   OBJECTIVE:   PATIENT SURVEYS:  FOTO n/a-- mobility focus  SENSATION: Notes neuropathy with some burning in feet, dec'd sensation  EDEMA:  Bilateral swelling noted in LEs  LOWER EXTREMITY ROM: Active ROM Right eval Left eval  Hip flexion    Hip extension    Hip abduction    Hip adduction    Hip internal rotation    Hip external rotation    Knee flexion 110 110  Knee extension 0 0  Ankle dorsiflexion    Ankle plantarflexion    Ankle inversion    Ankle eversion     (Blank rows = not tested)  LOWER EXTREMITY MMT: *can lift against gravity MMT Right eval Left eval  Hip flexion 3/5 3/5  Hip extension    Hip abduction    Hip adduction    Hip internal rotation    Hip external rotation    Knee flexion 3/5 3/5  Knee extension 3/5 3/5  Ankle dorsiflexion 4/5 4/5  Ankle plantarflexion 3/5 3/5  Ankle inversion    Ankle eversion     (Blank rows = not tested)  FUNCTIONAL TESTS:  5 time sit to stand=56.61  Gait speed=0.302 ft/sec *significantly slowed speed*  GAIT: Distance walked: with [facility] slowed pace x 75 feet requiring increased time Assistive device utilized: Quad cane large base Level of assistance: Modified independence  slowed pace Comments: patient's quad cane is too high and she switches R and L sides-- PT to address Shoewear: Patient has to use slip on shoes and these slide during gait activities-- PT to address  Bed mobility: Independent climbing into high bed and rolling into bed   [facility] Adult PT Treatment:                                                DATE: 01/22/2023 Therapeutic Exercise: Supine: AROM heel slides x12 (B) SLR (small range) 10x5" Bridge + ball squeeze --> bridges, no ball x10 SAQ (green bolster) +  3#AW x10 (B) Seated resisted hip abd/add (resistance by therapist)  Standing: Alt toe taps on 4" step (HHAx2) x10  STS x5 (UE x2)    [facility] Adult PT Treatment:                                                DATE: 01/18/2023 Therapeutic Exercise: Standing staggered stance (FWW) with anterior weight shifting + glute activation Bkwd walking FWW x10' Supine:  AROM  heel slides x10 (B) Hooklying ball squeeze 10x5" Hooklying knee press on gait belt (abd iso, ankle abd press on ball b/w ankles) 10x5" Active SLR x10 (B) Bent knee fall out x15 RTB  Unilateral clamshell (no resistance)  Self Care: Adjusting FWW; showing patient how to replace sliders on back legs   [facility] Adult PT Treatment:                                                DATE: 01/15/2023 Therapeutic Exercise: Seated LAQs 3#AW 10x3" Supine heel slides x12  Hooklying small ball squeeze 10x3" Hooklying knee press on gait belt (abd iso) 10x3" Supine abdominal bracing with coregeous ball  Bent knee fall outs x15  Seated glute set 10x5" Therapeutic Activity: Gait training with RW --> focus on turns and directional changes leading with feet to decrease trunk rotation & twisting on knee Self Care: Adjustment of WBQC & safe placement of AD --> discussing using FWW over [facility] for more support    PATIENT EDUCATION:  Education details: HEP, plan of care Person educated: Patient Education method: Explanation, Demonstration, and Handouts Education comprehension: verbalized understanding, returned demonstration, and needs further education  HOME EXERCISE PROGRAM: Access Code: [email or web address] URL: [email or web address] Date: 01/22/2023 Prepared by: Carlynn Herald  Exercises - Supine Heel Slide  - 1 x daily - 7 x weekly - 1 sets - 10 reps - Supine Active Straight Leg Raise  - 1 x daily - 7 x weekly - 1 sets - 10 reps - Bent Knee Fallouts  - 1 x daily - 7 x weekly - 1 sets - 10 reps - Seated Long Arc Quad  - 1 x daily - 7 x  weekly - 1 sets - 10 reps - Standing Gluteal Sets  - 1 x daily - 7 x weekly - 1 sets - 10 reps - Sit to Stand with Armchair  - 1 x daily - 7 x weekly - 1 sets - 5 reps - Standing Anterior Toe Taps  - 1 x daily - 7 x weekly - 3 sets - 10 reps  ASSESSMENT:  CLINICAL IMPRESSION: Two-way resistance added with seated clamshells to progress hip strength. Patient challenged with alternating toe taps on low step; patient able to complete exercise with no exacerbation of pain, just fatigue.   OBJECTIVE IMPAIRMENTS: Abnormal gait, decreased activity tolerance, decreased balance, decreased mobility, decreased strength, impaired flexibility, obesity, and pain.   ACTIVITY LIMITATIONS: lifting, bending, standing, squatting, transfers, and locomotion level  PARTICIPATION LIMITATIONS: cleaning and community activity  PERSONAL FACTORS: 3+ comorbidities: obesity, diabetes, HTN, spinal stenosis  are also affecting patient's functional outcome.   REHAB POTENTIAL: Good  CLINICAL DECISION MAKING: Stable/uncomplicated  EVALUATION COMPLEXITY: Low   GOALS: Goals reviewed with patient? Yes  SHORT TERM GOALS: Target date: 01/31/23 The patient will be independent with HEP. Baseline: initiated at eval Goal status: INITIAL  2.  The patient will improve gait speed to > or equal to 0.5 ft/sec. Baseline: 0.302 ft/sec  Goal status: INITIAL  3.  The patient will reduce 5 time sit to stand to < or equal to 48 seconds. Baseline: 56.61 Goal status: INITIAL  LONG TERM GOALS: Target date: 8 weeks 03/01/23  The patient will be indep with progression of HEP. Baseline: initiated at eval. Goal status: INITIAL  2.  The patient will demo correct use of assistive  device. Baseline:  switches hands, needs height adjustment and more instruction Goal status: INITIAL  3.  The patient will improve gait speed to > or equal to 0.8 ft/sec Baseline:  0.302 ft/sec Goal status: INITIAL   4.  The patient will perform 5  time sit<>stand in < or equal to 40 seconds. Baseline: 56.61 seconds Goal status: INITIAL  5.  The patient will report ability to get out of her car and walk into clinic in < or equal to 10 minutes. Baseline:  Reports it takes extensive time to complete these tasks Goal status: INITIAL  PLAN:  PT FREQUENCY: 2x/week  PT DURATION: 8 weeks  PLANNED INTERVENTIONS: Therapeutic exercises, Therapeutic activity, Neuromuscular re-education, Balance training, Gait training, Patient/Family education, Self Care, and Joint mobilization  PLAN FOR NEXT SESSION:  Continue LE/core strengthening. Continue standing toe taps, STS; progress standing exercises & gait training as tolerated.   Sanjuana Mae, PTA 01/22/2023, 11:52 AM

## 2023-01-24 ENCOUNTER — Other Ambulatory Visit: Payer: Self-pay | Admitting: *Deleted

## 2023-01-24 MED ORDER — METFORMIN HCL 1000 MG PO TABS: 1000.0000 mg | ORAL_TABLET | Freq: Two times a day (BID) | ORAL | 1 refills | Status: DC

## 2023-01-25 ENCOUNTER — Ambulatory Visit: Payer: Medicare Other

## 2023-01-25 DIAGNOSIS — G8929 Other chronic pain: Secondary | ICD-10-CM | POA: Diagnosis not present

## 2023-01-25 DIAGNOSIS — M6281 Muscle weakness (generalized): Secondary | ICD-10-CM | POA: Diagnosis not present

## 2023-01-25 DIAGNOSIS — M25562 Pain in left knee: Secondary | ICD-10-CM | POA: Diagnosis not present

## 2023-01-25 DIAGNOSIS — M170 Bilateral primary osteoarthritis of knee: Secondary | ICD-10-CM | POA: Diagnosis not present

## 2023-01-25 DIAGNOSIS — R2689 Other abnormalities of gait and mobility: Secondary | ICD-10-CM | POA: Diagnosis not present

## 2023-01-25 DIAGNOSIS — M25561 Pain in right knee: Secondary | ICD-10-CM | POA: Diagnosis not present

## 2023-01-25 NOTE — Therapy (Signed)
OUTPATIENT PHYSICAL THERAPY LOWER EXTREMITY TREATMENT   Patient Name: Natalie Wright MRN: [id number] DOB:May 03, 1951, 72 y.o., female Today's Date: 01/25/2023  END OF SESSION:  PT End of Session - 01/25/23 1100     Visit Number 5    Number of Visits 16    Date for PT Re-Evaluation 03/01/23    Authorization Type medicare and tricare    Authorization - Visit Number 5    Progress Note Due on Visit 10    PT Start Time 1100    PT Stop Time 1145    PT Time Calculation (min) 45 min    Activity Tolerance Patient tolerated treatment well    Behavior During Therapy WFL for tasks assessed/performed            Past Medical History:  Diagnosis Date   Allergy    to cats and dogs   Diabetes mellitus    Hiatal hernia    and delayed gastric emptying/ sees [facility] for recurrent diarrhea   History of cardiovascular disorder 12/10/2008   Qualifier: Diagnosis of  By: Linford Arnold MD, Catherine     Hypertension    OSA (obstructive sleep apnea)    CPAP   Pedal edema    Spastic bladder    urology in [facility]   TIA (transient ischemic attack)    hx of- sees Dr Rayburn Ma (Neurology)   Past Surgical History:  Procedure Laterality Date   CATARACT EXTRACTION  08/2021   vaginal skin tag removal     Patient Active Problem List   Diagnosis Date Noted   Lumbar spondylosis 07/03/2022   Lumbar spinal stenosis 02/26/2022   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/2022   Bilateral hip pain 01/12/2022   Hearing loss due to cerumen impaction, right 01/12/2022   Hammertoe, bilateral 08/23/2021   History of foot ulcer 08/23/2021   Primary osteoarthritis of both knees 12/09/2020   History of burning pain in leg 08/29/2020   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   Tremor 05/05/2020   Pre-ulcerative corn or callous 01/29/2020   History of transient ischemic attack (TIA) 05/15/2018   Family history of ovarian cancer 04/02/2017   Aortic atherosclerosis (HCC) 08/31/2016    Venous stasis 08/10/2016   Palpitations 07/16/2016   Atrial dilatation, left 07/16/2016   Atrial septal aneurysm 07/16/2016   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   Cataract 04/18/2012   Hyperlipidemia 02/13/2011   Pulmonary nodules 09/22/2010   LEG PAIN 09/12/2009   Sleep apnea 04/13/2009   GERD 03/14/2009   Morbid obesity (HCC) 12/10/2008   Essential hypertension, benign 12/10/2008   RAYNAUD'S DISEASE 12/10/2008   OSTEOARTHRITIS 12/10/2008   URINARY INCONTINENCE 12/10/2008    PCP: [name], MD REFERRING PROVIDER: [name], MD REFERRING DIAG:  M17.0 (ICD-10-CM) - Primary osteoarthritis of both knees   THERAPY DIAG:  Muscle weakness (generalized)  Other abnormalities of gait and mobility  Chronic pain of right knee  Chronic pain of left knee  Rationale for Evaluation and Treatment: Rehabilitation  ONSET DATE: 12/17/22  SUBJECTIVE:  SUBJECTIVE STATEMENT: Patient reports she notices she has more strength in her legs when getting out of the car; states she notices improvement with "shifting" of bone in knees. Patient states she continues to have pain in knees when turning and pivoting.   PERTINENT HISTORY: Diabetes, HTN, TIA, spinal stenosis, polymyalgia rheumatica PAIN:  Are you having pain? Yes: NPRS scale: 6/10 Pain location: back and knees Pain description: "snap, crackle, pop" pain, instability at knees where  they give out, and neuropathy in her feet Aggravating factors: trying to move after standing, pivoting to step  Relieving factors: nothing *pain also down R hip into thigh  PRECAUTIONS: Fall  WEIGHT BEARING RESTRICTIONS: No  FALLS:  Has patient fallen in last 6 months? Yes. Number of falls 1  LIVING ENVIRONMENT: Lives with: lives alone spouse passed away 2022-05-28 Lives in: House/apartment Stairs: No Has following equipment at home: Quad cane large base and shower bench, grab bars, scooter (uses husband's scooter)  PLOF:   declining   mobility  PATIENT GOALS: "I'm not sure. I don't know how much therapy will help before knee surgery."  Be able to get in and out of her car easier-- right now, it is so slow and a long process.   OBJECTIVE:   PATIENT SURVEYS:  FOTO n/a-- mobility focus  SENSATION: Notes neuropathy with some burning in feet, dec'd sensation  EDEMA:  Bilateral swelling noted in LEs  LOWER EXTREMITY ROM: Active ROM Right eval Left eval  Hip flexion    Hip extension    Hip abduction    Hip adduction    Hip internal rotation    Hip external rotation    Knee flexion 110 110  Knee extension 0 0  Ankle dorsiflexion    Ankle plantarflexion    Ankle inversion    Ankle eversion     (Blank rows = not tested)  LOWER EXTREMITY MMT: *can lift against gravity MMT Right eval Left eval  Hip flexion 3/5 3/5  Hip extension    Hip abduction    Hip adduction    Hip internal rotation    Hip external rotation    Knee flexion 3/5 3/5  Knee extension 3/5 3/5  Ankle dorsiflexion 4/5 4/5  Ankle plantarflexion 3/5 3/5  Ankle inversion    Ankle eversion     (Blank rows = not tested)  FUNCTIONAL TESTS:  5 time sit to stand=56.61  Gait speed=0.302 ft/sec *significantly slowed speed*  GAIT: Distance walked: with [facility] slowed pace x 75 feet requiring increased time Assistive device utilized: Quad cane large base Level of assistance: Modified independence  slowed pace Comments: patient's quad cane is too high and she switches R and L sides-- PT to address Shoewear: Patient has to use slip on shoes and these slide during gait activities-- PT to address  Bed mobility: Independent climbing into high bed and rolling into bed   [facility] Adult PT Treatment:                                                DATE: 01/25/2023 Therapeutic Exercise: Supine: AROM heel slides --> added RTB resistance during knee extension x10 (B) SAQ (green bolster) + 3#AW x10 (B) Bridges + ball squeeze x12 Therapeutic  Activity: Walking with FWW and focusing on UE/LE alignment when turning Foot taps on 4" step --> progressed to fwd weight shifting --> step up (alt feet)    [facility] Adult PT Treatment:                                                DATE: 01/22/2023 Therapeutic Exercise: Supine: AROM heel slides x12 (B) SLR (small range) 10x5" Bridge + ball squeeze -->  bridges, no ball x10 SAQ (green bolster) + 3#AW x10 (B) Seated resisted hip abd/add (resistance by therapist)  Standing: Alt toe taps on 4" step (HHAx2) x10  STS x5 (UE x2)    [facility] Adult PT Treatment:                                                DATE: 01/18/2023 Therapeutic Exercise: Standing staggered stance (FWW) with anterior weight shifting + glute activation Bkwd walking FWW x10' Supine:  AROM heel slides x10 (B) Hooklying ball squeeze 10x5" Hooklying knee press on gait belt (abd iso, ankle abd press on ball b/w ankles) 10x5" Active SLR x10 (B) Bent knee fall out x15 RTB  Unilateral clamshell (no resistance)  Self Care: Adjusting FWW; showing patient how to replace sliders on back legs    PATIENT EDUCATION:  Education details: HEP, plan of care Person educated: Patient Education method: Explanation, Demonstration, and Handouts Education comprehension: verbalized understanding, returned demonstration, and needs further education  HOME EXERCISE PROGRAM: Access Code: [email or web address] URL: [email or web address] Date: 01/25/2023 Prepared by: Carlynn Herald  Exercises - Supine Knee and Hip Extension with Resistance  - 1 x daily - 7 x weekly - 3 sets - 10 reps - Supine Active Straight Leg Raise  - 1 x daily - 7 x weekly - 1 sets - 10 reps - Bent Knee Fallouts  - 1 x daily - 7 x weekly - 1 sets - 10 reps - Seated Long Arc Quad  - 1 x daily - 7 x weekly - 1 sets - 10 reps - Sit to Stand with Armchair  - 1 x daily - 7 x weekly - 1 sets - 5 reps - Lateral Step Up with Counter Support  - 1 x daily - 7 x weekly - 3 sets -  10 reps - Forward Step Up with Counter Support  - 1 x daily - 7 x weekly - 3 sets - 10 reps - Side Stepping with Counter Support  - 1 x daily - 7 x weekly - 3 sets - 10 reps  ASSESSMENT:  CLINICAL IMPRESSION: Resistance added with heel slides to progress quad activation and strengthening. Noted knee locking with weight bearing during step up activity; patient had difficulty with functional knee bending and eccentric control due to knee pain and weakness. Patient fatigues quickly and requires brief rest breaks between exercises.    OBJECTIVE IMPAIRMENTS: Abnormal gait, decreased activity tolerance, decreased balance, decreased mobility, decreased strength, impaired flexibility, obesity, and pain.   ACTIVITY LIMITATIONS: lifting, bending, standing, squatting, transfers, and locomotion level  PARTICIPATION LIMITATIONS: cleaning and community activity  PERSONAL FACTORS: 3+ comorbidities: obesity, diabetes, HTN, spinal stenosis  are also affecting patient's functional outcome.   REHAB POTENTIAL: Good  CLINICAL DECISION MAKING: Stable/uncomplicated  EVALUATION COMPLEXITY: Low   GOALS: Goals reviewed with patient? Yes  SHORT TERM GOALS: Target date: 01/31/23  The patient will be independent with HEP. Baseline: initiated at eval Goal status: INITIAL  2.  The patient will improve gait speed to > or equal to 0.5 ft/sec. Baseline: 0.302 ft/sec  Goal status: INITIAL  3.  The patient will reduce 5 time sit to stand to < or equal to 48 seconds. Baseline: 56.61 Goal status: INITIAL   LONG TERM GOALS: Target date: 8 weeks 03/01/23  The patient will be indep with progression of HEP.  Baseline: initiated at eval. Goal status: INITIAL  2.  The patient will demo correct use of assistive device. Baseline:  switches hands, needs height adjustment and more instruction Goal status: INITIAL  3.  The patient will improve gait speed to > or equal to 0.8 ft/sec Baseline:  0.302 ft/sec Goal  status: INITIAL   4.  The patient will perform 5 time sit<>stand in < or equal to 40 seconds. Baseline: 56.61 seconds Goal status: INITIAL  5.  The patient will report ability to get out of her car and walk into clinic in < or equal to 10 minutes. Baseline:  Reports it takes extensive time to complete these tasks Goal status: INITIAL  PLAN:  PT FREQUENCY: 2x/week  PT DURATION: 8 weeks  PLANNED INTERVENTIONS: Therapeutic exercises, Therapeutic activity, Neuromuscular re-education, Balance training, Gait training, Patient/Family education, Self Care, and Joint mobilization  PLAN FOR NEXT SESSION:  Trial lateral step up on low stepper next visit. Continue LE/core strengthening. Continue standing toe taps, STS; progress standing exercises & gait training as tolerated.   Sanjuana Mae, PTA 01/25/2023, 11:47 AM

## 2023-01-27 NOTE — Therapy (Signed)
OUTPATIENT PHYSICAL THERAPY LOWER EXTREMITY TREATMENT   Patient Name: Natalie Wright MRN: [id number] DOB:1951/05/06, 72 y.o., female Today's Date: 01/28/2023  END OF SESSION:  PT End of Session - 01/28/23 1315     Visit Number 6    Number of Visits 16    Date for PT Re-Evaluation 03/01/23    Authorization - Visit Number 6    Progress Note Due on Visit 10    PT Start Time 1316    PT Stop Time 1359    PT Time Calculation (min) 43 min    Activity Tolerance Patient tolerated treatment well    Behavior During Therapy WFL for tasks assessed/performed             Past Medical History:  Diagnosis Date   Allergy    to cats and dogs   Diabetes mellitus    Hiatal hernia    and delayed gastric emptying/ sees [facility] for recurrent diarrhea   History of cardiovascular disorder 12/10/2008   Qualifier: Diagnosis of  By: Linford Arnold MD, Catherine     Hypertension    OSA (obstructive sleep apnea)    CPAP   Pedal edema    Spastic bladder    urology in [place]   TIA (transient ischemic attack)    hx of- sees Dr Rayburn Ma (Neurology)   Past Surgical History:  Procedure Laterality Date   CATARACT EXTRACTION  08/2021   vaginal skin tag removal     Patient Active Problem List   Diagnosis Date Noted   Lumbar spondylosis 07/03/2022   Lumbar spinal stenosis 02/26/2022   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/2022   Bilateral hip pain 01/12/2022   Hearing loss due to cerumen impaction, right 01/12/2022   Hammertoe, bilateral 08/23/2021   History of foot ulcer 08/23/2021   Primary osteoarthritis of both knees 12/09/2020   History of burning pain in leg 08/29/2020   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   Tremor 05/05/2020   Pre-ulcerative corn or callous 01/29/2020   History of transient ischemic attack (TIA) 05/15/2018   Family history of ovarian cancer 04/02/2017   Aortic atherosclerosis (HCC) 08/31/2016   Venous stasis 08/10/2016    Palpitations 07/16/2016   Atrial dilatation, left 07/16/2016   Atrial septal aneurysm 07/16/2016   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   Cataract 04/18/2012   Hyperlipidemia 02/13/2011   Pulmonary nodules 09/22/2010   LEG PAIN 09/12/2009   Sleep apnea 04/13/2009   GERD 03/14/2009   Morbid obesity (HCC) 12/10/2008   Essential hypertension, benign 12/10/2008   RAYNAUD'S DISEASE 12/10/2008   OSTEOARTHRITIS 12/10/2008   URINARY INCONTINENCE 12/10/2008    PCP: [name], MD REFERRING PROVIDER: [name], MD REFERRING DIAG:  M17.0 (ICD-10-CM) - Primary osteoarthritis of both knees   THERAPY DIAG:  Muscle weakness (generalized)  Other abnormalities of gait and mobility  Chronic pain of right knee  Chronic pain of left knee  Rationale for Evaluation and Treatment: Rehabilitation  ONSET DATE: 12/17/22  SUBJECTIVE:  SUBJECTIVE STATEMENT: Sunday had excruciating L knee pain  PERTINENT HISTORY: Diabetes, HTN, TIA, spinal stenosis, polymyalgia rheumatica PAIN:  Are you having pain? Yes: NPRS scale: 8/10 Pain location: back and knees Pain description: "snap, crackle, pop" pain, instability at knees where they give out, and neuropathy in her feet Aggravating factors: trying to move after standing, pivoting to step  Relieving factors: nothing *pain also down R hip into thigh  PRECAUTIONS: Fall  WEIGHT BEARING RESTRICTIONS: No  FALLS:  Has patient  fallen in last 6 months? Yes. Number of falls 1  LIVING ENVIRONMENT: Lives with: lives alone spouse passed away 2022/05/10 Lives in: House/apartment Stairs: No Has following equipment at home: Quad cane large base and shower bench, grab bars, scooter (uses husband's scooter)  PLOF:  declining   mobility  PATIENT GOALS: "I'm not sure. I don't know how much therapy will help before knee surgery."  Be able to get in and out of her car easier-- right now, it is so slow and a long process.   OBJECTIVE:   PATIENT  SURVEYS:  FOTO n/a-- mobility focus  SENSATION: Notes neuropathy with some burning in feet, dec'd sensation  EDEMA:  Bilateral swelling noted in LEs  LOWER EXTREMITY ROM: Active ROM Right eval Left eval  Hip flexion    Hip extension    Hip abduction    Hip adduction    Hip internal rotation    Hip external rotation    Knee flexion 110 110  Knee extension 0 0  Ankle dorsiflexion    Ankle plantarflexion    Ankle inversion    Ankle eversion     (Blank rows = not tested)  LOWER EXTREMITY MMT: *can lift against gravity MMT Right eval Left eval  Hip flexion 3/5 3/5  Hip extension    Hip abduction    Hip adduction    Hip internal rotation    Hip external rotation    Knee flexion 3/5 3/5  Knee extension 3/5 3/5  Ankle dorsiflexion 4/5 4/5  Ankle plantarflexion 3/5 3/5  Ankle inversion    Ankle eversion     (Blank rows = not tested)  FUNCTIONAL TESTS:  5 time sit to stand=56.61  Gait speed=0.302 ft/sec *significantly slowed speed*  GAIT: Distance walked: with [facility] slowed pace x 75 feet requiring increased time Assistive device utilized: Quad cane large base Level of assistance: Modified independence  slowed pace Comments: patient's quad cane is too high and she switches R and L sides-- PT to address Shoewear: Patient has to use slip on shoes and these slide during gait activities-- PT to address  Bed mobility: Independent climbing into high bed and rolling into bed   [facility] Adult PT Treatment:                                                DATE: 01/28/2023 Therapeutic Exercise: Supine: AROM heel slides --> added RTB resistance during knee extension x10 (B) SAQ (green bolster) + 3#AW x10 (B) Bridges + ball squeeze x12 Prone quad stretch with strap 2x 30 sec ea Supine quad stretch with strap x 30 sec L only for HEP Lateral step ups on 4 inch step L side only x 10 (increased popping and crunching) Lateral step taps x 8 B with one UE support and cues to engage  gluteals - pain "unlocking" standing knee after.     [facility] Adult PT Treatment:                                                DATE: 01/25/2023 Therapeutic Exercise: Supine: AROM heel slides --> added RTB resistance during knee extension x10 (B) SAQ (green bolster) + 3#AW x10 (B) Bridges + ball squeeze x12 Therapeutic  Activity: Walking with FWW and focusing on UE/LE alignment when turning Foot taps on 4" step --> progressed to fwd weight shifting --> step up (alt feet)    [facility] Adult PT Treatment:                                                DATE: 01/22/2023 Therapeutic Exercise: Supine: AROM heel slides x12 (B) SLR (small range) 10x5" Bridge + ball squeeze --> bridges, no ball x10 SAQ (green bolster) + 3#AW x10 (B) Seated resisted hip abd/add (resistance by therapist)  Standing: Alt toe taps on 4" step (HHAx2) x10  STS x5 (UE x2)    [facility] Adult PT Treatment:                                                DATE: 01/18/2023 Therapeutic Exercise: Standing staggered stance (FWW) with anterior weight shifting + glute activation Bkwd walking FWW x10' Supine:  AROM heel slides x10 (B) Hooklying ball squeeze 10x5" Hooklying knee press on gait belt (abd iso, ankle abd press on ball b/w ankles) 10x5" Active SLR x10 (B) Bent knee fall out x15 RTB  Unilateral clamshell (no resistance)  Self Care: Adjusting FWW; showing patient how to replace sliders on back legs    PATIENT EDUCATION:  Education details: HEP, plan of care Person educated: Patient Education method: Explanation, Demonstration, and Handouts Education comprehension: verbalized understanding, returned demonstration, and needs further education  HOME EXERCISE PROGRAM: Access Code: [email or web address] URL: [email or web address] Date: 01/28/2023 Prepared by: Raynelle Fanning  Exercises - Supine Knee and Hip Extension with Resistance  - 1 x daily - 7 x weekly - 3 sets - 10 reps - Supine Active Straight Leg Raise  - 1 x daily  - 7 x weekly - 1 sets - 10 reps - Bent Knee Fallouts  - 1 x daily - 7 x weekly - 1 sets - 10 reps - Seated Long Arc Quad  - 1 x daily - 7 x weekly - 1 sets - 10 reps - Sit to Stand with Armchair  - 1 x daily - 7 x weekly - 1 sets - 5 reps - Lateral Step Up with Counter Support  - 1 x daily - 7 x weekly - 3 sets - 10 reps - Forward Step Up with Counter Support  - 1 x daily - 7 x weekly - 3 sets - 10 reps - Side Stepping with Counter Support  - 1 x daily - 7 x weekly - 3 sets - 10 reps - Supine Quadriceps Stretch with Strap on Table  - 2 x daily - 7 x weekly - 1 sets - 3 reps - 30-60 sec hold  ASSESSMENT:  CLINICAL IMPRESSION: Natalie Wright reports increased knee pain yesterday of unknown reason. We added a quad stretch to her HEP in case it was related to muscle tightness post exercise. She did not tolerate lateral step ups very well. She had marked popping and some discomfort. We modified to a step tap which led to pt having difficulty "unlocking" her standing knee once finished. Advised pt to try to keep a soft knee when performing these types of exercises at home. She will try them to tolerance. Natalie Wright continues to demonstrate potential  for improvement and would benefit from continued skilled therapy to address impairments.    OBJECTIVE IMPAIRMENTS: Abnormal gait, decreased activity tolerance, decreased balance, decreased mobility, decreased strength, impaired flexibility, obesity, and pain.   ACTIVITY LIMITATIONS: lifting, bending, standing, squatting, transfers, and locomotion level  PARTICIPATION LIMITATIONS: cleaning and community activity  PERSONAL FACTORS: 3+ comorbidities: obesity, diabetes, HTN, spinal stenosis  are also affecting patient's functional outcome.   REHAB POTENTIAL: Good  CLINICAL DECISION MAKING: Stable/uncomplicated  EVALUATION COMPLEXITY: Low   GOALS: Goals reviewed with patient? Yes  SHORT TERM GOALS: Target date: 01/31/23  The patient will be independent  with HEP. Baseline: initiated at eval Goal status: INITIAL  2.  The patient will improve gait speed to > or equal to 0.5 ft/sec. Baseline: 0.302 ft/sec  Goal status: INITIAL  3.  The patient will reduce 5 time sit to stand to < or equal to 48 seconds. Baseline: 56.61 Goal status: INITIAL   LONG TERM GOALS: Target date: 8 weeks 03/01/23  The patient will be indep with progression of HEP. Baseline: initiated at eval. Goal status: INITIAL  2.  The patient will demo correct use of assistive device. Baseline:  switches hands, needs height adjustment and more instruction Goal status: INITIAL  3.  The patient will improve gait speed to > or equal to 0.8 ft/sec Baseline:  0.302 ft/sec Goal status: INITIAL   4.  The patient will perform 5 time sit<>stand in < or equal to 40 seconds. Baseline: 56.61 seconds Goal status: INITIAL  5.  The patient will report ability to get out of her car and walk into clinic in < or equal to 10 minutes. Baseline:  Reports it takes extensive time to complete these tasks Goal status: INITIAL  PLAN:  PT FREQUENCY: 2x/week  PT DURATION: 8 weeks  PLANNED INTERVENTIONS: Therapeutic exercises, Therapeutic activity, Neuromuscular re-education, Balance training, Gait training, Patient/Family education, Self Care, and Joint mobilization  PLAN FOR NEXT SESSION:  Continue LE/core strengthening. Continue standing toe taps, STS; progress standing exercises & gait training as tolerated.   Solon Palm, PT  01/28/2023, 2:10 PM

## 2023-01-28 ENCOUNTER — Encounter: Payer: Self-pay | Admitting: Physical Therapy

## 2023-01-28 ENCOUNTER — Ambulatory Visit: Payer: Medicare Other | Admitting: Physical Therapy

## 2023-01-28 DIAGNOSIS — G8929 Other chronic pain: Secondary | ICD-10-CM | POA: Diagnosis not present

## 2023-01-28 DIAGNOSIS — R2689 Other abnormalities of gait and mobility: Secondary | ICD-10-CM

## 2023-01-28 DIAGNOSIS — M25561 Pain in right knee: Secondary | ICD-10-CM | POA: Diagnosis not present

## 2023-01-28 DIAGNOSIS — M6281 Muscle weakness (generalized): Secondary | ICD-10-CM

## 2023-01-28 DIAGNOSIS — M170 Bilateral primary osteoarthritis of knee: Secondary | ICD-10-CM | POA: Diagnosis not present

## 2023-01-28 DIAGNOSIS — M25562 Pain in left knee: Secondary | ICD-10-CM | POA: Diagnosis not present

## 2023-01-29 ENCOUNTER — Ambulatory Visit: Payer: Medicare Other | Admitting: Family Medicine

## 2023-01-29 DIAGNOSIS — E113213 Type 2 diabetes mellitus with mild nonproliferative diabetic retinopathy with macular edema, bilateral: Secondary | ICD-10-CM | POA: Diagnosis not present

## 2023-01-29 DIAGNOSIS — H2512 Age-related nuclear cataract, left eye: Secondary | ICD-10-CM | POA: Diagnosis not present

## 2023-01-29 DIAGNOSIS — H20021 Recurrent acute iridocyclitis, right eye: Secondary | ICD-10-CM | POA: Diagnosis not present

## 2023-01-29 DIAGNOSIS — H43811 Vitreous degeneration, right eye: Secondary | ICD-10-CM | POA: Diagnosis not present

## 2023-02-04 DIAGNOSIS — H40023 Open angle with borderline findings, high risk, bilateral: Secondary | ICD-10-CM | POA: Diagnosis not present

## 2023-02-04 DIAGNOSIS — H35351 Cystoid macular degeneration, right eye: Secondary | ICD-10-CM | POA: Diagnosis not present

## 2023-02-06 ENCOUNTER — Ambulatory Visit: Payer: Medicare Other | Attending: Sports Medicine

## 2023-02-06 DIAGNOSIS — M25562 Pain in left knee: Secondary | ICD-10-CM | POA: Insufficient documentation

## 2023-02-06 DIAGNOSIS — M25561 Pain in right knee: Secondary | ICD-10-CM | POA: Insufficient documentation

## 2023-02-06 DIAGNOSIS — R2689 Other abnormalities of gait and mobility: Secondary | ICD-10-CM | POA: Insufficient documentation

## 2023-02-06 DIAGNOSIS — G8929 Other chronic pain: Secondary | ICD-10-CM | POA: Insufficient documentation

## 2023-02-06 DIAGNOSIS — M6281 Muscle weakness (generalized): Secondary | ICD-10-CM | POA: Insufficient documentation

## 2023-02-06 NOTE — Therapy (Signed)
OUTPATIENT PHYSICAL THERAPY LOWER EXTREMITY TREATMENT   Patient Name: Natalie Wright MRN: [id number] DOB:16-Jun-1951, 72 y.o., female Today's Date: 02/06/2023  END OF SESSION:  PT End of Session - 02/06/23 0939     Visit Number 7    Number of Visits 16    Date for PT Re-Evaluation 03/01/23    Authorization Type medicare and tricare    Authorization - Visit Number 7    Progress Note Due on Visit 10    PT Start Time [phone]    PT Stop Time 1021    PT Time Calculation (min) 45 min    Activity Tolerance Patient tolerated treatment well    Behavior During Therapy WFL for tasks assessed/performed             Past Medical History:  Diagnosis Date   Allergy    to cats and dogs   Diabetes mellitus    Hiatal hernia    and delayed gastric emptying/ sees [facility] for recurrent diarrhea   History of cardiovascular disorder 12/10/2008   Qualifier: Diagnosis of  By: Linford Arnold MD, Catherine     Hypertension    OSA (obstructive sleep apnea)    CPAP   Pedal edema    Spastic bladder    urology in [place]   TIA (transient ischemic attack)    hx of- sees Dr Rayburn Ma (Neurology)   Past Surgical History:  Procedure Laterality Date   CATARACT EXTRACTION  08/2021   vaginal skin tag removal     Patient Active Problem List   Diagnosis Date Noted   Lumbar spondylosis 07/03/2022   Lumbar spinal stenosis 02/26/2022   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/2022   Bilateral hip pain 01/12/2022   Hearing loss due to cerumen impaction, right 01/12/2022   Hammertoe, bilateral 08/23/2021   History of foot ulcer 08/23/2021   Primary osteoarthritis of both knees 12/09/2020   History of burning pain in leg 08/29/2020   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   Tremor 05/05/2020   Pre-ulcerative corn or callous 01/29/2020   History of transient ischemic attack (TIA) 05/15/2018   Family history of ovarian cancer 04/02/2017   Aortic atherosclerosis (HCC)  08/31/2016   Venous stasis 08/10/2016   Palpitations 07/16/2016   Atrial dilatation, left 07/16/2016   Atrial septal aneurysm 07/16/2016   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   Cataract 04/18/2012   Hyperlipidemia 02/13/2011   Pulmonary nodules 09/22/2010   LEG PAIN 09/12/2009   Sleep apnea 04/13/2009   GERD 03/14/2009   Morbid obesity (HCC) 12/10/2008   Essential hypertension, benign 12/10/2008   RAYNAUD'S DISEASE 12/10/2008   OSTEOARTHRITIS 12/10/2008   URINARY INCONTINENCE 12/10/2008    PCP: [name], MD REFERRING PROVIDER: [name], MD REFERRING DIAG:  M17.0 (ICD-10-CM) - Primary osteoarthritis of both knees   THERAPY DIAG:  Muscle weakness (generalized)  Other abnormalities of gait and mobility  Chronic pain of right knee  Chronic pain of left knee  Rationale for Evaluation and Treatment: Rehabilitation  ONSET DATE: 12/17/22  SUBJECTIVE:  SUBJECTIVE STATEMENT: Patient reports her knees are feeling the same, states R knee is feeling much better since last visit. Patient states her hip pain is 4/10 and knee pain is 6/10.  PERTINENT HISTORY: Diabetes, HTN, TIA, spinal stenosis, polymyalgia rheumatica PAIN:  Are you having pain? Yes: NPRS scale: 6/10 Pain location: back and knees Pain description: "snap, crackle, pop" pain, instability at knees where they give out, and neuropathy in her feet Aggravating factors:  trying to move after standing, pivoting to step  Relieving factors: nothing *pain also down R hip into thigh  PRECAUTIONS: Fall  WEIGHT BEARING RESTRICTIONS: No  FALLS:  Has patient fallen in last 6 months? Yes. Number of falls 1  LIVING ENVIRONMENT: Lives with: lives alone spouse passed away 2022-05-15 Lives in: House/apartment Stairs: No Has following equipment at home: Quad cane large base and shower bench, grab bars, scooter (uses husband's scooter)  PLOF:  declining   mobility  PATIENT GOALS: "I'm not sure. I don't know  how much therapy will help before knee surgery."  Be able to get in and out of her car easier-- right now, it is so slow and a long process.   OBJECTIVE:   PATIENT SURVEYS:  FOTO n/a-- mobility focus  SENSATION: Notes neuropathy with some burning in feet, dec'd sensation  EDEMA:  Bilateral swelling noted in LEs  LOWER EXTREMITY ROM: Active ROM Right eval Left eval  Hip flexion    Hip extension    Hip abduction    Hip adduction    Hip internal rotation    Hip external rotation    Knee flexion 110 110  Knee extension 0 0  Ankle dorsiflexion    Ankle plantarflexion    Ankle inversion    Ankle eversion     (Blank rows = not tested)  LOWER EXTREMITY MMT: *can lift against gravity MMT Right eval Left eval  Hip flexion 3/5 3/5  Hip extension    Hip abduction    Hip adduction    Hip internal rotation    Hip external rotation    Knee flexion 3/5 3/5  Knee extension 3/5 3/5  Ankle dorsiflexion 4/5 4/5  Ankle plantarflexion 3/5 3/5  Ankle inversion    Ankle eversion     (Blank rows = not tested)  FUNCTIONAL TESTS:  5 time sit to stand=56.61  Gait speed=0.302 ft/sec *significantly slowed speed*  GAIT: Distance walked: with [facility] slowed pace x 75 feet requiring increased time Assistive device utilized: Quad cane large base Level of assistance: Modified independence  slowed pace Comments: patient's quad cane is too high and she switches R and L sides-- PT to address Shoewear: Patient has to use slip on shoes and these slide during gait activities-- PT to address  Bed mobility: Independent climbing into high bed and rolling into bed  [facility] Adult PT Treatment:                                                DATE: 02/06/2023 Therapeutic Exercise: Supine: Heel slides GTB x12 (B) SAQ (green bolster) 4#AW x10 (B) Bridges + ball x10  Bridges + clamshell GTB x10 Bent knee fall out GTB x10 (B) Prone quad stretch w/strap 3x30" Seated straight leg hip abd raises M/L over  yellow therabar x10 (B) Supine modified piriformis stretch   [facility] Adult PT Treatment:                                                DATE: 01/28/2023 Therapeutic Exercise: Supine: AROM heel slides --> added RTB resistance during knee extension x10 (B) SAQ (green bolster) + 3#AW x10 (B) Bridges + ball squeeze x12 Prone quad stretch with strap  2x 30 sec ea Supine quad stretch with strap x 30 sec L only for HEP Lateral step ups on 4 inch step L side only x 10 (increased popping and crunching) Lateral step taps x 8 B with one UE support and cues to engage gluteals - pain "unlocking" standing knee after.    [facility] Adult PT Treatment:                                                DATE: 01/25/2023 Therapeutic Exercise: Supine: AROM heel slides --> added RTB resistance during knee extension x10 (B) SAQ (green bolster) + 3#AW x10 (B) Bridges + ball squeeze x12 Therapeutic Activity: Walking with FWW and focusing on UE/LE alignment when turning Foot taps on 4" step --> progressed to fwd weight shifting --> step up (alt feet)   PATIENT EDUCATION:  Education details: HEP, plan of care Person educated: Patient Education method: Explanation, Demonstration, and Handouts Education comprehension: verbalized understanding, returned demonstration, and needs further education  HOME EXERCISE PROGRAM: Access Code: [email or web address] URL: [email or web address] Date: 02/06/2023 Prepared by: Carlynn Herald  Program Notes Aim for [phone] steps per day  Exercises - Sit to Stand with Armchair  - 1 x daily - 7 x weekly - 1 sets - 5 reps - Seated Knee Extension with Resistance  - 1 x daily - 7 x weekly - 3 sets - 10 reps - Seated March with Resistance  - 1 x daily - 7 x weekly - 3 sets - 10 reps - Seated Hamstring Stretch  - 1 x daily - 7 x weekly - 1 sets - 3 reps - 20 hold - Standing Hip Extension with Counter Support  - 1 x daily - 7 x weekly - 3 sets - 10 reps - Mini Squat with Counter Support   - 1 x daily - 7 x weekly - 3 sets - 10 reps - Clamshell with Resistance  - 1 x daily - 7 x weekly - 3 sets - 10 reps - Single Leg Stance with Support  - 1 x daily - 7 x weekly - 3 sets - 10 reps - Standing Tandem Balance with Counter Support  - 1 x daily - 7 x weekly - 3 sets - 10 reps - Supine Single Leg Ankle Pumps  - 1 x daily - 7 x weekly - 3 sets - 10 reps - Supine Ankle Circles  - 1 x daily - 7 x weekly - 3 sets - 10 reps - Seated Ankle Alphabet  - 1 x daily - 7 x weekly - 3 sets - 10 reps - Seated March  - 1 x daily - 7 x weekly - 3 sets - 10 reps - Supine Piriformis Stretch with Towel  - 1 x daily - 7 x weekly - 1 sets - 3-5 reps - 20-30 sec hold  ASSESSMENT:  CLINICAL IMPRESSION: Hip and quad strengthening exercises continued to progress LE strength and stability. Hip flexor strengthening addressed with seated leg raises to address difficulty with entering/exiting a car.   OBJECTIVE IMPAIRMENTS: Abnormal gait, decreased activity tolerance, decreased balance, decreased mobility, decreased strength, impaired flexibility, obesity, and pain.   ACTIVITY LIMITATIONS: lifting, bending, standing, squatting, transfers, and locomotion level  PARTICIPATION LIMITATIONS: cleaning and community activity  PERSONAL FACTORS: 3+ comorbidities: obesity, diabetes, HTN, spinal stenosis  are also affecting patient's functional outcome.  REHAB POTENTIAL: Good  CLINICAL DECISION MAKING: Stable/uncomplicated  EVALUATION COMPLEXITY: Low   GOALS: Goals reviewed with patient? Yes  SHORT TERM GOALS: Target date: 01/31/23  The patient will be independent with HEP. Baseline: initiated at eval Goal status: INITIAL  2.  The patient will improve gait speed to > or equal to 0.5 ft/sec. Baseline: 0.302 ft/sec  Goal status: INITIAL  3.  The patient will reduce 5 time sit to stand to < or equal to 48 seconds. Baseline: 56.61 Goal status: INITIAL   LONG TERM GOALS: Target date: 8 weeks  03/01/23  The patient will be indep with progression of HEP. Baseline: initiated at eval. Goal status: INITIAL  2.  The patient will demo correct use of assistive device. Baseline:  switches hands, needs height adjustment and more instruction Goal status: INITIAL  3.  The patient will improve gait speed to > or equal to 0.8 ft/sec Baseline:  0.302 ft/sec Goal status: INITIAL   4.  The patient will perform 5 time sit<>stand in < or equal to 40 seconds. Baseline: 56.61 seconds Goal status: INITIAL  5.  The patient will report ability to get out of her car and walk into clinic in < or equal to 10 minutes. Baseline:  Reports it takes extensive time to complete these tasks Goal status: INITIAL  PLAN:  PT FREQUENCY: 2x/week  PT DURATION: 8 weeks  PLANNED INTERVENTIONS: Therapeutic exercises, Therapeutic activity, Neuromuscular re-education, Balance training, Gait training, Patient/Family education, Self Care, and Joint mobilization  PLAN FOR NEXT SESSION:  Continue LE/core strengthening. Continue standing toe taps, STS; progress standing exercises & gait training as tolerated.   Carlynn Herald, PTA 02/06/2023 10:21 AM

## 2023-02-08 ENCOUNTER — Ambulatory Visit: Payer: Medicare Other | Admitting: Podiatry

## 2023-02-11 ENCOUNTER — Encounter: Payer: Self-pay | Admitting: Rehabilitative and Restorative Service Providers"

## 2023-02-11 ENCOUNTER — Ambulatory Visit: Payer: Medicare Other | Admitting: Rehabilitative and Restorative Service Providers"

## 2023-02-11 DIAGNOSIS — G8929 Other chronic pain: Secondary | ICD-10-CM

## 2023-02-11 DIAGNOSIS — R2689 Other abnormalities of gait and mobility: Secondary | ICD-10-CM | POA: Diagnosis not present

## 2023-02-11 DIAGNOSIS — M6281 Muscle weakness (generalized): Secondary | ICD-10-CM | POA: Diagnosis not present

## 2023-02-11 DIAGNOSIS — M25562 Pain in left knee: Secondary | ICD-10-CM | POA: Diagnosis not present

## 2023-02-11 DIAGNOSIS — M25561 Pain in right knee: Secondary | ICD-10-CM | POA: Diagnosis not present

## 2023-02-11 NOTE — Therapy (Unsigned)
OUTPATIENT PHYSICAL THERAPY LOWER EXTREMITY TREATMENT   Patient Name: Natalie Wright MRN: [id number] DOB:1951/02/05, 72 y.o., female Today's Date: 02/11/2023  END OF SESSION:  PT End of Session - 02/11/23 1319     Visit Number 8    Number of Visits 16    Date for PT Re-Evaluation 03/01/23    Authorization Type medicare and tricare    Progress Note Due on Visit 10    PT Start Time 1320    PT Stop Time 1400    PT Time Calculation (min) 40 min    Activity Tolerance Patient tolerated treatment well    Behavior During Therapy WFL for tasks assessed/performed             Past Medical History:  Diagnosis Date   Allergy    to cats and dogs   Diabetes mellitus    Hiatal hernia    and delayed gastric emptying/ sees [facility] for recurrent diarrhea   History of cardiovascular disorder 12/10/2008   Qualifier: Diagnosis of  By: Linford Arnold MD, Catherine     Hypertension    OSA (obstructive sleep apnea)    CPAP   Pedal edema    Spastic bladder    urology in [place]   TIA (transient ischemic attack)    hx of- sees Dr Rayburn Ma (Neurology)   Past Surgical History:  Procedure Laterality Date   CATARACT EXTRACTION  08/2021   vaginal skin tag removal     Patient Active Problem List   Diagnosis Date Noted   Lumbar spondylosis 07/03/2022   Lumbar spinal stenosis 02/26/2022   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/2022   Bilateral hip pain 01/12/2022   Hearing loss due to cerumen impaction, right 01/12/2022   Hammertoe, bilateral 08/23/2021   History of foot ulcer 08/23/2021   Primary osteoarthritis of both knees 12/09/2020   History of burning pain in leg 08/29/2020   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   Tremor 05/05/2020   Pre-ulcerative corn or callous 01/29/2020   History of transient ischemic attack (TIA) 05/15/2018   Family history of ovarian cancer 04/02/2017   Aortic atherosclerosis (HCC) 08/31/2016   Venous stasis 08/10/2016    Palpitations 07/16/2016   Atrial dilatation, left 07/16/2016   Atrial septal aneurysm 07/16/2016   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   Cataract 04/18/2012   Hyperlipidemia 02/13/2011   Pulmonary nodules 09/22/2010   LEG PAIN 09/12/2009   Sleep apnea 04/13/2009   GERD 03/14/2009   Morbid obesity (HCC) 12/10/2008   Essential hypertension, benign 12/10/2008   RAYNAUD'S DISEASE 12/10/2008   OSTEOARTHRITIS 12/10/2008   URINARY INCONTINENCE 12/10/2008    PCP: [name], MD REFERRING PROVIDER: [name], MD REFERRING DIAG:  M17.0 (ICD-10-CM) - Primary osteoarthritis of both knees   THERAPY DIAG:  Muscle weakness (generalized)  Other abnormalities of gait and mobility  Chronic pain of right knee  Chronic pain of left knee  Rationale for Evaluation and Treatment: Rehabilitation  ONSET DATE: 12/17/22  SUBJECTIVE:  SUBJECTIVE STATEMENT: Patient reports her knees are feeling the same, states R knee is feeling much better since last visit. Patient states her hip pain is 4/10 and knee pain is 6/10.  PERTINENT HISTORY: Diabetes, HTN, TIA, spinal stenosis, polymyalgia rheumatica PAIN:  Are you having pain? Yes: NPRS scale: 6/10 Pain location: back and knees Pain description: "snap, crackle, pop" pain, instability at knees where they give out, and neuropathy in her feet Aggravating factors: trying to move after standing, pivoting to step  Relieving factors: nothing *pain also down R hip into thigh  PRECAUTIONS: Fall  WEIGHT BEARING RESTRICTIONS: No  FALLS:  Has patient fallen in last 6 months? Yes. Number of falls 1  LIVING ENVIRONMENT: Lives with: lives alone spouse passed away 2022/05/29 Lives in: House/apartment Stairs: No Has following equipment at home: Quad cane large base and shower bench, grab bars, scooter (uses husband's scooter)  PLOF:  declining   mobility  PATIENT GOALS: "I'm not sure. I don't know how much therapy will help before knee  surgery."  Be able to get in and out of her car easier-- right now, it is so slow and a long process.   OBJECTIVE:  (Measures in this section from initial evaluation unless otherwise noted) PATIENT SURVEYS:  FOTO n/a-- mobility focus SENSATION: Notes neuropathy with some burning in feet, dec'd sensation EDEMA:  Bilateral swelling noted in LEs LOWER EXTREMITY ROM: Active ROM Right eval Left eval  Hip flexion    Hip extension    Hip abduction    Hip adduction    Hip internal rotation    Hip external rotation    Knee flexion 110 110  Knee extension 0 0  Ankle dorsiflexion    Ankle plantarflexion    Ankle inversion    Ankle eversion     (Blank rows = not tested) LOWER EXTREMITY MMT: *can lift against gravity MMT Right eval Left eval  Hip flexion 3/5 3/5  Hip extension    Hip abduction    Hip adduction    Hip internal rotation    Hip external rotation    Knee flexion 3/5 3/5  Knee extension 3/5 3/5  Ankle dorsiflexion 4/5 4/5  Ankle plantarflexion 3/5 3/5  Ankle inversion    Ankle eversion     (Blank rows = not tested) FUNCTIONAL TESTS:  5 time sit to stand=56.61  Gait speed=0.302 ft/sec *significantly slowed speed* GAIT: Distance walked: with [facility] slowed pace x 75 feet requiring increased time Assistive device utilized: Quad cane large base Level of assistance: Modified independence  slowed pace Comments: patient's quad cane is too high and she switches R and L sides-- PT to address Shoewear: Patient has to use slip on shoes and these slide during gait activities-- PT to address  Bed mobility: Independent climbing into high bed and rolling into bed  [facility] Adult PT Treatment:                                                DATE: 02/11/2023 Therapeutic Exercise: Seated Isometric LAQ for quad engagement into physioball x 10 reps HS isometrics x 10 reps R and L  Standing Sit<>stand x 5 reps Mini squats near countertop with walker support x 10 reps Reaching  overhead with 1 # weights alternating R and L sides x 5 reps each side (like reaching into cabinet) Step ups to 4" step 3 reps R and L Alternating foot taps with UE support to 4" step Therapeutic Activity: Stepping over yoga block on its side x 10 reps R and L to begin work to help with car transfers 5 time sit<>stand=22.5 seconds Gait: Gait for warm up with RW x 160 feet with mod indep using RW 0.66 ft/sec gait speed with RW today   [facility] Adult PT Treatment:  DATE: 02/06/2023 Therapeutic Exercise: Supine: Heel slides GTB x12 (B) SAQ (green bolster) 4#AW x10 (B) Bridges + ball x10  Bridges + clamshell GTB x10 Bent knee fall out GTB x10 (B) Prone quad stretch w/strap 3x30" Seated straight leg hip abd raises M/L over yellow therabar x10 (B) Supine modified piriformis stretch   [facility] Adult PT Treatment:                                                DATE: 01/28/2023 Therapeutic Exercise: Supine: AROM heel slides --> added RTB resistance during knee extension x10 (B) SAQ (green bolster) + 3#AW x10 (B) Bridges + ball squeeze x12 Prone quad stretch with strap 2x 30 sec ea Supine quad stretch with strap x 30 sec L only for HEP Lateral step ups on 4 inch step L side only x 10 (increased popping and crunching) Lateral step taps x 8 B with one UE support and cues to engage gluteals - pain "unlocking" standing knee after.    [facility] Adult PT Treatment:                                                DATE: 01/25/2023 Therapeutic Exercise: Supine: AROM heel slides --> added RTB resistance during knee extension x10 (B) SAQ (green bolster) + 3#AW x10 (B) Bridges + ball squeeze x12 Therapeutic Activity: Walking with FWW and focusing on UE/LE alignment when turning Foot taps on 4" step --> progressed to fwd weight shifting --> step up (alt feet)   PATIENT EDUCATION:  Education details: HEP, plan of care Person educated: Patient Education method:  Explanation, Demonstration, and Handouts Education comprehension: verbalized understanding, returned demonstration, and needs further education  HOME EXERCISE PROGRAM: Access Code: [email or web address] URL: [email or web address] Date: 01/28/2023 Prepared by: Raynelle Fanning   Exercises - Supine Knee and Hip Extension with Resistance  - 1 x daily - 7 x weekly - 3 sets - 10 reps - Supine Active Straight Leg Raise  - 1 x daily - 7 x weekly - 1 sets - 10 reps - Bent Knee Fallouts  - 1 x daily - 7 x weekly - 1 sets - 10 reps - Seated Long Arc Quad  - 1 x daily - 7 x weekly - 1 sets - 10 reps - Sit to Stand with Armchair  - 1 x daily - 7 x weekly - 1 sets - 5 reps - Lateral Step Up with Counter Support  - 1 x daily - 7 x weekly - 3 sets - 10 reps - Forward Step Up with Counter Support  - 1 x daily - 7 x weekly - 3 sets - 10 reps - Side Stepping with Counter Support  - 1 x daily - 7 x weekly - 3 sets - 10 reps - Supine Quadriceps Stretch with Strap on Table  - 2 x daily - 7 x weekly - 1 sets - 3 reps - 30-60 sec hold  ASSESSMENT:  CLINICAL IMPRESSION: ***Hip and quad strengthening exercises continued to progress LE strength and stability. Hip flexor strengthening addressed with seated leg raises to address difficulty with entering/exiting a car.   OBJECTIVE IMPAIRMENTS: Abnormal gait, decreased activity tolerance, decreased balance, decreased mobility, decreased strength, impaired flexibility, obesity, and  pain.   GOALS: Goals reviewed with patient? Yes  SHORT TERM GOALS: Target date: 01/31/23  The patient will be independent with HEP. Baseline: initiated at eval Goal status:MET  2.  The patient will improve gait speed to > or equal to 0.5 ft/sec. Baseline: 0.302 ft/sec  Goal status: MET-- 0.66 ft/sec  3.  The patient will reduce 5 time sit to stand to < or equal to 48 seconds. Baseline: 56.61 Goal status: MET-- scores 22.5 seconds on 02/11/23   LONG TERM GOALS: Target date: 8 weeks  03/01/23  The patient will be indep with progression of HEP. Baseline: initiated at eval. Goal status: INITIAL  2.  The patient will demo correct use of assistive device. Baseline:  switches hands, needs height adjustment and more instruction Goal status: INITIAL  3.  The patient will improve gait speed to > or equal to 0.8 ft/sec Baseline:  0.302 ft/sec Goal status: INITIAL   4.  The patient will perform 5 time sit<>stand in < or equal to 40 seconds. Baseline: 56.61 seconds Goal status: MET -- met on 02/11/23 (see STG)  5.  The patient will report ability to get out of her car and walk into clinic in < or equal to 10 minutes. Baseline:  Reports it takes extensive time to complete these tasks Goal status: INITIAL  PLAN:  PT FREQUENCY: 2x/week  PT DURATION: 8 weeks  PLANNED INTERVENTIONS: Therapeutic exercises, Therapeutic activity, Neuromuscular re-education, Balance training, Gait training, Patient/Family education, Self Care, and Joint mobilization  PLAN FOR NEXT SESSION:  Continue LE/core strengthening. Continue standing toe taps, STS; progress standing exercises & gait training as tolerated.   Carlynn Herald, PTA 02/11/2023 1:20 PM

## 2023-02-14 ENCOUNTER — Ambulatory Visit: Payer: Medicare Other

## 2023-02-14 DIAGNOSIS — M6281 Muscle weakness (generalized): Secondary | ICD-10-CM | POA: Diagnosis not present

## 2023-02-14 DIAGNOSIS — R2689 Other abnormalities of gait and mobility: Secondary | ICD-10-CM | POA: Diagnosis not present

## 2023-02-14 DIAGNOSIS — G8929 Other chronic pain: Secondary | ICD-10-CM

## 2023-02-14 DIAGNOSIS — M25561 Pain in right knee: Secondary | ICD-10-CM | POA: Diagnosis not present

## 2023-02-14 DIAGNOSIS — M25562 Pain in left knee: Secondary | ICD-10-CM | POA: Diagnosis not present

## 2023-02-14 NOTE — Therapy (Signed)
OUTPATIENT PHYSICAL THERAPY LOWER EXTREMITY TREATMENT   Patient Name: Natalie Wright MRN: [id number] DOB:1950/11/19, 72 y.o., female Today's Date: 02/14/2023  END OF SESSION:  PT End of Session - 02/14/23 1105     Visit Number 9    Number of Visits 16    Date for PT Re-Evaluation 03/01/23    Authorization Type medicare and tricare    Authorization - Visit Number 9    Progress Note Due on Visit 10    PT Start Time 1105    PT Stop Time 1143    PT Time Calculation (min) 38 min    Activity Tolerance Patient tolerated treatment well    Behavior During Therapy WFL for tasks assessed/performed             Past Medical History:  Diagnosis Date   Allergy    to cats and dogs   Diabetes mellitus    Hiatal hernia    and delayed gastric emptying/ sees [facility] for recurrent diarrhea   History of cardiovascular disorder 12/10/2008   Qualifier: Diagnosis of  By: Linford Arnold MD, Catherine     Hypertension    OSA (obstructive sleep apnea)    CPAP   Pedal edema    Spastic bladder    urology in [place]   TIA (transient ischemic attack)    hx of- sees Dr Rayburn Ma (Neurology)   Past Surgical History:  Procedure Laterality Date   CATARACT EXTRACTION  08/2021   vaginal skin tag removal     Patient Active Problem List   Diagnosis Date Noted   Lumbar spondylosis 07/03/2022   Lumbar spinal stenosis 02/26/2022   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/2022   Bilateral hip pain 01/12/2022   Hearing loss due to cerumen impaction, right 01/12/2022   Hammertoe, bilateral 08/23/2021   History of foot ulcer 08/23/2021   Primary osteoarthritis of both knees 12/09/2020   History of burning pain in leg 08/29/2020   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   Tremor 05/05/2020   Pre-ulcerative corn or callous 01/29/2020   History of transient ischemic attack (TIA) 05/15/2018   Family history of ovarian cancer 04/02/2017   Aortic atherosclerosis (HCC)  08/31/2016   Venous stasis 08/10/2016   Palpitations 07/16/2016   Atrial dilatation, left 07/16/2016   Atrial septal aneurysm 07/16/2016   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   Cataract 04/18/2012   Hyperlipidemia 02/13/2011   Pulmonary nodules 09/22/2010   LEG PAIN 09/12/2009   Sleep apnea 04/13/2009   GERD 03/14/2009   Morbid obesity (HCC) 12/10/2008   Essential hypertension, benign 12/10/2008   RAYNAUD'S DISEASE 12/10/2008   OSTEOARTHRITIS 12/10/2008   URINARY INCONTINENCE 12/10/2008    PCP: [name], MD REFERRING PROVIDER: [name], MD REFERRING DIAG:  M17.0 (ICD-10-CM) - Primary osteoarthritis of both knees   THERAPY DIAG:  Muscle weakness (generalized)  Other abnormalities of gait and mobility  Chronic pain of right knee  Chronic pain of left knee  Rationale for Evaluation and Treatment: Rehabilitation  ONSET DATE: 12/17/22  SUBJECTIVE:  SUBJECTIVE STATEMENT: Patient reports she went out to lunch yesterday and had knee pain/nerve pain similar to when she is driving in R leg while sitting due to being in a tight space at the restaurant, states after she rested the pain went away. Patient continues to have 6/10 pain in bilateral knees.  PERTINENT HISTORY: Diabetes, HTN, TIA, spinal stenosis, polymyalgia rheumatica PAIN:  Are you having pain? Yes: NPRS scale: 6/10 Pain location: back and  knees Pain description: "snap, crackle, pop" pain, instability at knees where they give out, and neuropathy in her feet Aggravating factors: trying to move after standing, pivoting to step  Relieving factors: nothing *pain also down R hip into thigh  PRECAUTIONS: Fall  WEIGHT BEARING RESTRICTIONS: No  FALLS:  Has patient fallen in last 6 months? Yes. Number of falls 1  LIVING ENVIRONMENT: Lives with: lives alone spouse passed away 06-07-2022 Lives in: House/apartment Stairs: No Has following equipment at home: Quad cane large base and shower bench, grab  bars, scooter (uses husband's scooter)  PLOF:  declining   mobility  PATIENT GOALS: "I'm not sure. I don't know how much therapy will help before knee surgery."  Be able to get in and out of her car easier-- right now, it is so slow and a long process.   OBJECTIVE:  (Measures in this section from initial evaluation unless otherwise noted) PATIENT SURVEYS:  FOTO n/a-- mobility focus SENSATION: Notes neuropathy with some burning in feet, dec'd sensation EDEMA:  Bilateral swelling noted in LEs LOWER EXTREMITY ROM: Active ROM Right eval Left eval  Hip flexion    Hip extension    Hip abduction    Hip adduction    Hip internal rotation    Hip external rotation    Knee flexion 110 110  Knee extension 0 0  Ankle dorsiflexion    Ankle plantarflexion    Ankle inversion    Ankle eversion     (Blank rows = not tested) LOWER EXTREMITY MMT: *can lift against gravity MMT Right eval Left eval  Hip flexion 3/5 3/5  Hip extension    Hip abduction    Hip adduction    Hip internal rotation    Hip external rotation    Knee flexion 3/5 3/5  Knee extension 3/5 3/5  Ankle dorsiflexion 4/5 4/5  Ankle plantarflexion 3/5 3/5  Ankle inversion    Ankle eversion     (Blank rows = not tested) FUNCTIONAL TESTS:  5 time sit to stand=56.61  Gait speed=0.302 ft/sec *significantly slowed speed* GAIT: Distance walked: with [facility] slowed pace x 75 feet requiring increased time Assistive device utilized: Quad cane large base Level of assistance: Modified independence  slowed pace Comments: patient's quad cane is too high and she switches R and L sides-- PT to address Shoewear: Patient has to use slip on shoes and these slide during gait activities-- PT to address  Bed mobility: Independent climbing into high bed and rolling into bed   [facility] Adult PT Treatment:                                                DATE: 02/14/2023 Therapeutic Exercise: Seated: Isometric LAQ for quad engagement into  orange PB x 10 reps (B) HS isometrics into orange PB x 10 reps (B) Straight leg hip abd raises M/L over block x10 (B) Standing mini squat with eccentric resistance (orange super band) Seated TA bracing (review of handout) Therapeutic Activity: 4" step: step ups x5 leading each leg 4" step: alt foot taps x10 Lateral weight shifting with glute iso activation --> fwd weight shift with foot on 4" step   [facility] Adult PT Treatment:  DATE: 02/11/2023 Therapeutic Exercise: Seated Isometric LAQ for quad engagement into physioball x 10 reps HS isometrics x 10 reps R and L  Standing Sit<>stand x 5 reps Mini squats near countertop with walker support x 10 reps Reaching overhead with 1 # weights alternating R and L sides x 5 reps each side (like reaching into cabinet) Step ups to 4" step 3 reps R and L Alternating foot taps with UE support to 4" step Therapeutic Activity: Seated stepping over yoga block on its side x 10 reps R and L to begin work to help with car transfers 5 time sit<>stand=22.5 seconds Gait: Gait for warm up with RW x 160 feet with mod indep using RW, 80 ft at end of session with RW 0.66 ft/sec gait speed with RW today    [facility] Adult PT Treatment:                                                DATE: 02/06/2023 Therapeutic Exercise: Supine: Heel slides GTB x12 (B) SAQ (green bolster) 4#AW x10 (B) Bridges + ball x10  Bridges + clamshell GTB x10 Bent knee fall out GTB x10 (B) Prone quad stretch w/strap 3x30" Seated straight leg hip abd raises M/L over yellow therabar x10 (B) Supine modified piriformis stretch   PATIENT EDUCATION:  Education details: Updated HEP Person educated: Patient Education method: Explanation, Demonstration, and Handouts Education comprehension: verbalized understanding, returned demonstration, and needs further education  HOME EXERCISE PROGRAM: Access Code: [email or web address] URL:  [email or web address] Date: 02/14/2023 Prepared by: Carlynn Herald  Exercises - Supine Knee and Hip Extension with Resistance  - 1 x daily - 7 x weekly - 3 sets - 10 reps - Supine Active Straight Leg Raise  - 1 x daily - 7 x weekly - 1 sets - 10 reps - Bent Knee Fallouts  - 1 x daily - 7 x weekly - 1 sets - 10 reps - Seated Long Arc Quad  - 1 x daily - 7 x weekly - 1 sets - 10 reps - Sit to Stand with Armchair  - 1 x daily - 7 x weekly - 1 sets - 5 reps - Lateral Step Up with Counter Support  - 1 x daily - 7 x weekly - 3 sets - 10 reps - Forward Step Up with Counter Support  - 1 x daily - 7 x weekly - 3 sets - 10 reps - Side Stepping with Counter Support  - 1 x daily - 7 x weekly - 3 sets - 10 reps - Supine Quadriceps Stretch with Strap on Table  - 2 x daily - 7 x weekly - 1 sets - 3 reps - 30-60 sec hold - Seated Transversus Abdominis Bracing  - 1 x daily - 7 x weekly - 1 sets - 10 reps - 5 sec hold  ASSESSMENT:  CLINICAL IMPRESSION: Eccentric squats with resistance incorporated to progress LE strengthening; cueing hip hinge decreased anterior knee pain. Exercises continued with focus on improving car transfers and stair navigation. L>R LE weakness noted during step ups on low stair; worked on glute activation with transfer onto stabilizing leg. TA bracing handout provided to progress core stability.  OBJECTIVE IMPAIRMENTS: Abnormal gait, decreased activity tolerance, decreased balance, decreased mobility, decreased strength, impaired flexibility, obesity, and pain.   GOALS: Goals reviewed with patient? Yes  SHORT TERM GOALS:  Target date: 01/31/23  The patient will be independent with HEP. Baseline: initiated at eval Goal status:MET  2.  The patient will improve gait speed to > or equal to 0.5 ft/sec. Baseline: 0.302 ft/sec  Goal status: MET-- 0.66 ft/sec  3.  The patient will reduce 5 time sit to stand to < or equal to 48 seconds. Baseline: 56.61 Goal status: MET--  scores 22.5 seconds on 02/11/23   LONG TERM GOALS: Target date: 8 weeks 03/01/23  The patient will be indep with progression of HEP. Baseline: initiated at eval. Goal status: MET  2.  The patient will demo correct use of assistive device. Baseline:  switches hands, needs height adjustment and more instruction Goal status: INITIAL  3.  The patient will improve gait speed to > or equal to 0.8 ft/sec Baseline:  0.302 ft/sec Goal status: INITIAL   4.  The patient will perform 5 time sit<>stand in < or equal to 40 seconds. Baseline: 56.61 seconds Goal status: MET -- met on 02/11/23 (see STG)  5.  The patient will report ability to get out of her car and walk into clinic in < or equal to 10 minutes. Baseline:  2-3 min with FWW Goal status: MET  PLAN:  PT FREQUENCY: 2x/week  PT DURATION: 8 weeks  PLANNED INTERVENTIONS: Therapeutic exercises, Therapeutic activity, Neuromuscular re-education, Balance training, Gait training, Patient/Family education, Self Care, and Joint mobilization  PLAN FOR NEXT SESSION:  Continue LE/core strengthening. Continue standing toe taps, STS; progress standing exercises & gait training as tolerated.  Sanjuana Mae, PTA 02/14/2023

## 2023-02-17 NOTE — Therapy (Incomplete)
OUTPATIENT PHYSICAL THERAPY LOWER EXTREMITY TREATMENT   Patient Name: Natalie Wright MRN: [id number] DOB:1951/05/20, 72 y.o., female Today's Date: 02/17/2023  END OF SESSION:    Past Medical History:  Diagnosis Date   Allergy    to cats and dogs   Diabetes mellitus    Hiatal hernia    and delayed gastric emptying/ sees [facility] for recurrent diarrhea   History of cardiovascular disorder 12/10/2008   Qualifier: Diagnosis of  By: Linford Arnold MD, Catherine     Hypertension    OSA (obstructive sleep apnea)    CPAP   Pedal edema    Spastic bladder    urology in [place]   TIA (transient ischemic attack)    hx of- sees Dr Rayburn Ma (Neurology)   Past Surgical History:  Procedure Laterality Date   CATARACT EXTRACTION  08/2021   vaginal skin tag removal     Patient Active Problem List   Diagnosis Date Noted   Lumbar spondylosis 07/03/2022   Lumbar spinal stenosis 02/26/2022   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/2022   Bilateral hip pain 01/12/2022   Hearing loss due to cerumen impaction, right 01/12/2022   Hammertoe, bilateral 08/23/2021   History of foot ulcer 08/23/2021   Primary osteoarthritis of both knees 12/09/2020   History of burning pain in leg 08/29/2020   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   Tremor 05/05/2020   Pre-ulcerative corn or callous 01/29/2020   History of transient ischemic attack (TIA) 05/15/2018   Family history of ovarian cancer 04/02/2017   Aortic atherosclerosis (HCC) 08/31/2016   Venous stasis 08/10/2016   Palpitations 07/16/2016   Atrial dilatation, left 07/16/2016   Atrial septal aneurysm 07/16/2016   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   Cataract 04/18/2012   Hyperlipidemia 02/13/2011   Pulmonary nodules 09/22/2010   LEG PAIN 09/12/2009   Sleep apnea 04/13/2009   GERD 03/14/2009   Morbid obesity (HCC) 12/10/2008   Essential hypertension, benign 12/10/2008   RAYNAUD'S DISEASE 12/10/2008    OSTEOARTHRITIS 12/10/2008   URINARY INCONTINENCE 12/10/2008    PCP: [name], MD REFERRING PROVIDER: [name], MD REFERRING DIAG:  M17.0 (ICD-10-CM) - Primary osteoarthritis of both knees   THERAPY DIAG:  No diagnosis found.  Rationale for Evaluation and Treatment: Rehabilitation  ONSET DATE: 12/17/22  SUBJECTIVE:  SUBJECTIVE STATEMENT: ***  PERTINENT HISTORY: Diabetes, HTN, TIA, spinal stenosis, polymyalgia rheumatica PAIN:  Are you having pain? Yes: NPRS scale: 6/10 Pain location: back and knees Pain description: "snap, crackle, pop" pain, instability at knees where they give out, and neuropathy in her feet Aggravating factors: trying to move after standing, pivoting to step  Relieving factors: nothing *pain also down R hip into thigh  PRECAUTIONS: Fall  WEIGHT BEARING RESTRICTIONS: No  FALLS:  Has patient fallen in last 6 months? Yes. Number of falls 1  LIVING ENVIRONMENT: Lives with: lives alone spouse passed away 09-Jun-2022 Lives in: House/apartment Stairs: No Has following equipment at home: Quad cane large base and shower bench, grab bars, scooter (uses husband's scooter)  PLOF:  declining   mobility  PATIENT GOALS: "I'm not sure. I don't know how much therapy will help before knee surgery."  Be able to get in and out of her car easier-- right now, it is so slow and a long process.   OBJECTIVE:  (Measures in this section from initial evaluation unless otherwise noted) PATIENT SURVEYS:  FOTO n/a-- mobility focus SENSATION: Notes neuropathy with some burning in feet, dec'd sensation EDEMA:  Bilateral swelling noted in LEs LOWER EXTREMITY ROM: Active ROM Right eval Left eval  Hip flexion    Hip extension    Hip abduction    Hip adduction    Hip internal rotation    Hip external rotation    Knee flexion 110 110  Knee extension 0 0  Ankle dorsiflexion    Ankle plantarflexion    Ankle inversion    Ankle eversion     (Blank  rows = not tested) LOWER EXTREMITY MMT: *can lift against gravity MMT Right eval Left eval  Hip flexion 3/5 3/5  Hip extension    Hip abduction    Hip adduction    Hip internal rotation    Hip external rotation    Knee flexion 3/5 3/5  Knee extension 3/5 3/5  Ankle dorsiflexion 4/5 4/5  Ankle plantarflexion 3/5 3/5  Ankle inversion    Ankle eversion     (Blank rows = not tested) FUNCTIONAL TESTS:  5 time sit to stand=56.61  Gait speed=0.302 ft/sec *significantly slowed speed* GAIT: Distance walked: with [facility] slowed pace x 75 feet requiring increased time Assistive device utilized: Quad cane large base Level of assistance: Modified independence  slowed pace Comments: patient's quad cane is too high and she switches R and L sides-- PT to address Shoewear: Patient has to use slip on shoes and these slide during gait activities-- PT to address  Bed mobility: Independent climbing into high bed and rolling into bed   [facility] Adult PT Treatment:                                                DATE: 02/18/2023 Therapeutic Exercise: Seated: Isometric LAQ for quad engagement into orange PB x 10 reps (B) HS isometrics into orange PB x 10 reps (B) Straight leg hip abd raises M/L over block x10 (B) Standing mini squat with eccentric resistance (orange super band) Seated TA bracing (review of handout) Therapeutic Activity: 4" step: step ups x5 leading each leg 4" step: alt foot taps x10 Lateral weight shifting with glute iso activation --> fwd weight shift with foot on 4" step  [facility] Adult PT Treatment:                                                DATE: 02/14/2023 Therapeutic Exercise: Seated: Isometric LAQ for quad engagement into orange PB x 10 reps (B) HS isometrics into orange PB x 10 reps (B) Straight leg hip abd raises M/L over block x10 (B) Standing mini squat with eccentric resistance (orange super band) Seated TA bracing (review of handout) Therapeutic Activity: 4" step:  step ups x5 leading each leg 4" step: alt foot taps x10 Lateral weight shifting with glute iso activation --> fwd weight shift with foot on 4" step   [facility] Adult PT Treatment:                                                DATE: 02/11/2023 Therapeutic Exercise: Seated Isometric LAQ for quad engagement into physioball x 10 reps HS isometrics x 10 reps R  and L  Standing Sit<>stand x 5 reps Mini squats near countertop with walker support x 10 reps Reaching overhead with 1 # weights alternating R and L sides x 5 reps each side (like reaching into cabinet) Step ups to 4" step 3 reps R and L Alternating foot taps with UE support to 4" step Therapeutic Activity: Seated stepping over yoga block on its side x 10 reps R and L to begin work to help with car transfers 5 time sit<>stand=22.5 seconds Gait: Gait for warm up with RW x 160 feet with mod indep using RW, 80 ft at end of session with RW 0.66 ft/sec gait speed with RW today   PATIENT EDUCATION:  Education details: Updated HEP Person educated: Patient Education method: Programmer, multimedia, Demonstration, and Handouts Education comprehension: verbalized understanding, returned demonstration, and needs further education  HOME EXERCISE PROGRAM: Access Code: [email or web address] URL: [email or web address] Date: 02/14/2023 Prepared by: Carlynn Herald  Exercises - Supine Knee and Hip Extension with Resistance  - 1 x daily - 7 x weekly - 3 sets - 10 reps - Supine Active Straight Leg Raise  - 1 x daily - 7 x weekly - 1 sets - 10 reps - Bent Knee Fallouts  - 1 x daily - 7 x weekly - 1 sets - 10 reps - Seated Long Arc Quad  - 1 x daily - 7 x weekly - 1 sets - 10 reps - Sit to Stand with Armchair  - 1 x daily - 7 x weekly - 1 sets - 5 reps - Lateral Step Up with Counter Support  - 1 x daily - 7 x weekly - 3 sets - 10 reps - Forward Step Up with Counter Support  - 1 x daily - 7 x weekly - 3 sets - 10 reps - Side Stepping with Counter Support  - 1 x  daily - 7 x weekly - 3 sets - 10 reps - Supine Quadriceps Stretch with Strap on Table  - 2 x daily - 7 x weekly - 1 sets - 3 reps - 30-60 sec hold - Seated Transversus Abdominis Bracing  - 1 x daily - 7 x weekly - 1 sets - 10 reps - 5 sec hold  ASSESSMENT:  CLINICAL IMPRESSION: ***  OBJECTIVE IMPAIRMENTS: Abnormal gait, decreased activity tolerance, decreased balance, decreased mobility, decreased strength, impaired flexibility, obesity, and pain.   GOALS: Goals reviewed with patient? Yes  SHORT TERM GOALS: Target date: 01/31/23  The patient will be independent with HEP. Baseline: initiated at eval Goal status:MET  2.  The patient will improve gait speed to > or equal to 0.5 ft/sec. Baseline: 0.302 ft/sec  Goal status: MET-- 0.66 ft/sec  3.  The patient will reduce 5 time sit to stand to < or equal to 48 seconds. Baseline: 56.61 Goal status: MET-- scores 22.5 seconds on 02/11/23   LONG TERM GOALS: Target date: 8 weeks 03/01/23  The patient will be indep with progression of HEP. Baseline: initiated at eval. Goal status: MET  2.  The patient will demo correct use of assistive device. Baseline:  switches hands, needs height adjustment and more instruction Goal status: INITIAL  3.  The patient will improve gait speed to > or equal to 0.8 ft/sec Baseline:  0.302 ft/sec Goal status: INITIAL   4.  The patient will perform 5 time sit<>stand in < or equal to 40 seconds. Baseline: 56.61 seconds Goal status: MET -- met on 02/11/23 (see STG)  5.  The patient will  report ability to get out of her car and walk into clinic in < or equal to 10 minutes. Baseline:  2-3 min with FWW Goal status: MET  PLAN:  PT FREQUENCY: 2x/week  PT DURATION: 8 weeks  PLANNED INTERVENTIONS: Therapeutic exercises, Therapeutic activity, Neuromuscular re-education, Balance training, Gait training, Patient/Family education, Self Care, and Joint mobilization  PLAN FOR NEXT SESSION:  Continue LE/core  strengthening. Continue standing toe taps, STS; progress standing exercises & gait training as tolerated.  Solon Palm, PT  02/17/2023

## 2023-02-18 ENCOUNTER — Encounter: Payer: Self-pay | Admitting: Rehabilitative and Restorative Service Providers"

## 2023-02-18 ENCOUNTER — Ambulatory Visit: Payer: Medicare Other | Admitting: Rehabilitative and Restorative Service Providers"

## 2023-02-18 DIAGNOSIS — R2689 Other abnormalities of gait and mobility: Secondary | ICD-10-CM | POA: Diagnosis not present

## 2023-02-18 DIAGNOSIS — G8929 Other chronic pain: Secondary | ICD-10-CM | POA: Diagnosis not present

## 2023-02-18 DIAGNOSIS — M6281 Muscle weakness (generalized): Secondary | ICD-10-CM | POA: Diagnosis not present

## 2023-02-18 DIAGNOSIS — M25562 Pain in left knee: Secondary | ICD-10-CM | POA: Diagnosis not present

## 2023-02-18 DIAGNOSIS — M25561 Pain in right knee: Secondary | ICD-10-CM | POA: Diagnosis not present

## 2023-02-18 NOTE — Therapy (Signed)
OUTPATIENT PHYSICAL THERAPY LOWER EXTREMITY TREATMENT and 10TH VISIT PROGRESS NOTE   Patient Name: Natalie Wright MRN: [id number] DOB:Jan 31, 1951, 72 y.o., female Today's Date: 02/18/2023  Physical Therapy Progress Note   Dates of Reporting Period:12/31/22-02/18/23   Objective Measurements: See goals below-- patient gait speed and strength improving.  Reason Skilled Services are Required: See assessment--- continued need for skilled care.  Thank you for the referral of this patient.   END OF SESSION:  PT End of Session - 02/18/23 1014     Visit Number 10    Number of Visits 16    Date for PT Re-Evaluation 03/01/23    Authorization Type medicare and tricare    Progress Note Due on Visit 10    PT Start Time 1016    PT Stop Time 1054    PT Time Calculation (min) 38 min    Activity Tolerance Patient tolerated treatment well    Behavior During Therapy WFL for tasks assessed/performed             Past Medical History:  Diagnosis Date   Allergy    to cats and dogs   Diabetes mellitus    Hiatal hernia    and delayed gastric emptying/ sees [facility] for recurrent diarrhea   History of cardiovascular disorder 12/10/2008   Qualifier: Diagnosis of  By: Linford Arnold MD, Catherine     Hypertension    OSA (obstructive sleep apnea)    CPAP   Pedal edema    Spastic bladder    urology in [facility]   TIA (transient ischemic attack)    hx of- sees Dr Rayburn Ma (Neurology)   Past Surgical History:  Procedure Laterality Date   CATARACT EXTRACTION  08/2021   vaginal skin tag removal     Patient Active Problem List   Diagnosis Date Noted   Lumbar spondylosis 07/03/2022   Lumbar spinal stenosis 02/26/2022   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/2022   Bilateral hip pain 01/12/2022   Hearing loss due to cerumen impaction, right 01/12/2022   Hammertoe, bilateral 08/23/2021   History of foot ulcer 08/23/2021   Primary osteoarthritis of both knees 12/09/2020   History of burning pain in  leg 08/29/2020   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   Tremor 05/05/2020   Pre-ulcerative corn or callous 01/29/2020   History of transient ischemic attack (TIA) 05/15/2018   Family history of ovarian cancer 04/02/2017   Aortic atherosclerosis (HCC) 08/31/2016   Venous stasis 08/10/2016   Palpitations 07/16/2016   Atrial dilatation, left 07/16/2016   Atrial septal aneurysm 07/16/2016   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   Cataract 04/18/2012   Hyperlipidemia 02/13/2011   Pulmonary nodules 09/22/2010   LEG PAIN 09/12/2009   Sleep apnea 04/13/2009   GERD 03/14/2009   Morbid obesity (HCC) 12/10/2008   Essential hypertension, benign 12/10/2008   RAYNAUD'S DISEASE 12/10/2008   OSTEOARTHRITIS 12/10/2008   URINARY INCONTINENCE 12/10/2008    PCP: [name], MD REFERRING PROVIDER: [name], MD REFERRING DIAG:  M17.0 (ICD-10-CM) - Primary osteoarthritis of both knees   THERAPY DIAG:  Muscle weakness (generalized)  Chronic pain of right knee  Chronic pain of left knee  Other abnormalities of gait and mobility  Rationale for Evaluation and Treatment: Rehabilitation  ONSET DATE: 12/17/22  SUBJECTIVE:  SUBJECTIVE STATEMENT: The patient feels that her knees are feeling better. She is still struggling getting into and out of the car.  Patient notices increased stamina for housework and reports not having  to leave dishes until later. She also reports her knees did not slip any more this past weekend.   PERTINENT HISTORY: Diabetes, HTN, TIA, spinal stenosis, polymyalgia rheumatica PAIN:  Are you having pain? Yes: NPRS scale: 5/10 Pain location: back and knees Pain description: "snap, crackle, pop" pain, instability at knees where they give out, and neuropathy in her feet Aggravating factors: trying to move after standing, pivoting to step  Relieving factors: nothing *pain also down R hip into thigh  PRECAUTIONS:  Fall  WEIGHT BEARING RESTRICTIONS: No  FALLS:  Has patient fallen in last 6 months? Yes. Number of falls 1  LIVING ENVIRONMENT: Lives with: lives alone spouse passed away Jun 16, 2022 Lives in: House/apartment Stairs: No Has following equipment at home: Quad cane large base and shower bench, grab bars, scooter (uses husband's scooter)  PLOF:  declining   mobility  PATIENT GOALS: "I'm not sure. I don't know how much therapy will help before knee surgery."  Be able to get in and out of her car easier-- right now, it is so slow and a long process.   OBJECTIVE:  (Measures in this section from initial evaluation unless otherwise noted) PATIENT SURVEYS:  FOTO n/a-- mobility focus SENSATION: Notes neuropathy with some burning in feet, dec'd sensation EDEMA:  Bilateral swelling noted in LEs LOWER EXTREMITY ROM: Active ROM Right eval Left eval  Hip flexion    Hip extension    Hip abduction    Hip adduction    Hip internal rotation    Hip external rotation    Knee flexion 110 110  Knee extension 0 0  Ankle dorsiflexion    Ankle plantarflexion    Ankle inversion    Ankle eversion     (Blank rows = not tested) LOWER EXTREMITY MMT: *can lift against gravity MMT Right eval Left eval Right 02/18/23 Left 02/18/23  Hip flexion 3/5 3/5 4+/5 4+/5  Hip extension      Hip abduction      Hip adduction      Hip internal rotation      Hip external rotation      Knee flexion 3/5 3/5 5/5 5/5  Knee extension 3/5 3/5 5/5 5/5  Ankle dorsiflexion 4/5 4/5    Ankle plantarflexion 3/5 3/5    Ankle inversion      Ankle eversion       (Blank rows = not tested) FUNCTIONAL TESTS:  5 time sit to stand=56.61  Gait speed=0.302 ft/sec *significantly slowed speed* GAIT: Distance walked: with [facility] slowed pace x 75 feet requiring increased time Assistive device utilized: Quad cane large base Level of assistance: Modified independence  slowed pace Comments: patient's quad cane is too high and she  switches R and L sides-- PT to address Shoewear: Patient has to use slip on shoes and these slide during gait activities-- PT to address  Bed mobility: Independent climbing into high bed and rolling into bed  [facility] Adult PT Treatment:                                                DATE: 02/18/23 Therapeutic Exercise: Seated Isometric LAQ into physioball x 10 reps bilat Isometric HS curl into physioball x 10 reps bilat Abdominal bracing x 5 reps See MMT above Therapeutic Activity: Sit<>stand x 5 reps In and out of car practice stepping over yoga blocks  and around corner/edge of mat Accompanied patient to her car and worked on different techniques-- the patient demonstrated that she stands and steps laterally with right leg to clear edge of car, then she sits and brings her L leg in. We trialed different techniques (sitting with both legs facing walker, then pivoting to face steering wheel) and patient's current technique appears to work best. Patient lifts RW over her to the passenger side. Gait: With RW x 160 feet nonstop Gait outdoor surfaces with RW mod indep to crosswalk and then on concrete/level parking lot surfaces Self Care: Discussed various ways to complete car transfer  [facility] Adult PT Treatment:                                                DATE: 02/14/2023 Therapeutic Exercise: Seated: Isometric LAQ for quad engagement into orange PB x 10 reps (B) HS isometrics into orange PB x 10 reps (B) Straight leg hip abd raises M/L over block x10 (B) Standing mini squat with eccentric resistance (orange super band) Seated TA bracing (review of handout) Therapeutic Activity: 4" step: step ups x5 leading each leg 4" step: alt foot taps x10 Lateral weight shifting with glute iso activation --> fwd weight shift with foot on 4" step  [facility] Adult PT Treatment:                                                DATE: 02/11/2023 Therapeutic Exercise: Seated Isometric LAQ for quad engagement into  physioball x 10 reps HS isometrics x 10 reps R and L  Standing Sit<>stand x 5 reps Mini squats near countertop with walker support x 10 reps Reaching overhead with 1 # weights alternating R and L sides x 5 reps each side (like reaching into cabinet) Step ups to 4" step 3 reps R and L Alternating foot taps with UE support to 4" step Therapeutic Activity: Seated stepping over yoga block on its side x 10 reps R and L to begin work to help with car transfers 5 time sit<>stand=22.5 seconds Gait: Gait for warm up with RW x 160 feet with mod indep using RW, 80 ft at end of session with RW 0.66 ft/sec gait speed with RW today  PATIENT EDUCATION:  Education details: Updated HEP Person educated: Patient Education method: Programmer, multimedia, Demonstration, and Handouts Education comprehension: verbalized understanding, returned demonstration, and needs further education  HOME EXERCISE PROGRAM: Access Code: [email or web address] URL: [email or web address] Date: 02/14/2023 Prepared by: Carlynn Herald  Exercises - Supine Knee and Hip Extension with Resistance  - 1 x daily - 7 x weekly - 3 sets - 10 reps - Supine Active Straight Leg Raise  - 1 x daily - 7 x weekly - 1 sets - 10 reps - Bent Knee Fallouts  - 1 x daily - 7 x weekly - 1 sets - 10 reps - Seated Long Arc Quad  - 1 x daily - 7 x weekly - 1 sets - 10 reps - Sit to Stand with Armchair  - 1 x daily - 7 x weekly - 1 sets - 5 reps - Lateral Step Up with Counter Support  - 1 x daily - 7 x weekly - 3 sets - 10 reps -  Forward Step Up with Counter Support  - 1 x daily - 7 x weekly - 3 sets - 10 reps - Side Stepping with Counter Support  - 1 x daily - 7 x weekly - 3 sets - 10 reps - Supine Quadriceps Stretch with Strap on Table  - 2 x daily - 7 x weekly - 1 sets - 3 reps - 30-60 sec hold - Seated Transversus Abdominis Bracing  - 1 x daily - 7 x weekly - 1 sets - 10 reps - 5 sec hold  ASSESSMENT:  CLINICAL IMPRESSION: The patient notes some  significant improvement with functional tasks of not feeling sensation of "knees slipping" and improved ability to complete daily chores at home. She continues to have difficulty with car transfers and walking in the community with quad cane. Her gait speed is significantly improved with RW versus quad cane. PT to continue working towards remaining LTGs. Plan to renew at end of themonth as patient has need for skilled care to progress LE strength, functional mobility, and reduce risk for falls.   OBJECTIVE IMPAIRMENTS: Abnormal gait, decreased activity tolerance, decreased balance, decreased mobility, decreased strength, impaired flexibility, obesity, and pain.   GOALS: Goals reviewed with patient? Yes  SHORT TERM GOALS: Target date: 01/31/23  The patient will be independent with HEP. Baseline: initiated at eval Goal status:MET  2.  The patient will improve gait speed to > or equal to 0.5 ft/sec. Baseline: 0.302 ft/sec  Goal status: MET-- 0.66 ft/sec  3.  The patient will reduce 5 time sit to stand to < or equal to 48 seconds. Baseline: 56.61 Goal status: MET-- scores 22.5 seconds on 02/11/23   LONG TERM GOALS: Target date: 8 weeks 03/01/23  The patient will be indep with progression of HEP. Baseline: initiated at eval. Goal status: MET  2.  The patient will demo correct use of assistive device. Baseline:  switches hands, needs height adjustment and more instruction Goal status: INITIAL  3.  The patient will improve gait speed to > or equal to 0.8 ft/sec Baseline:  0.302 ft/sec Goal status: INITIAL   4.  The patient will perform 5 time sit<>stand in < or equal to 40 seconds. Baseline: 56.61 seconds Goal status: MET -- met on 02/11/23 (see STG)  5.  The patient will report ability to get out of her car and walk into clinic in < or equal to 10 minutes. Baseline:  2-3 min with FWW Goal status: MET  PLAN:  PT FREQUENCY: 2x/week  PT DURATION: 8 weeks  PLANNED INTERVENTIONS:  Therapeutic exercises, Therapeutic activity, Neuromuscular re-education, Balance training, Gait training, Patient/Family education, Self Care, and Joint mobilization  PLAN FOR NEXT SESSION:  Continue LE/core strengthening. Continue standing toe taps, STS; progress standing exercises & gait training as tolerated. Renew at end of the month.  Taiquan Campanaro, PT 02/18/2023

## 2023-02-19 ENCOUNTER — Ambulatory Visit (INDEPENDENT_AMBULATORY_CARE_PROVIDER_SITE_OTHER): Payer: Medicare Other | Admitting: Sports Medicine

## 2023-02-19 DIAGNOSIS — M170 Bilateral primary osteoarthritis of knee: Secondary | ICD-10-CM

## 2023-02-19 NOTE — Progress Notes (Signed)
    Procedures performed today:    None.  Independent interpretation of notes and tests performed by another provider:   None.  Brief History, Exam, Impression, and Recommendations:    Primary osteoarthritis of both knees This very pleasant 72 year old female returns, she is now about 6 weeks status post a series of Orthovisc, we started in June. She has noted good improvements with Orthovisc but also dramatic improvements in her ADLs with physical therapy, she will continue this. She is not a candidate for arthroplasty due to her body mass index. She still has significant pain in her knees that she describes as limiting, so we will going get her a consultation regarding geniculate artery embolization.  She has not yet decided whether she would want to go through with the procedure just yet.    ____________________________________________ Ihor Austin. Benjamin Stain, M.D., ABFM., CAQSM., AME. Primary Care and Sports Medicine [facility]  Adjunct Professor of Family Medicine  [employer]  Restaurant manager, fast food

## 2023-02-19 NOTE — Patient Instructions (Signed)
Look into geniculate artery embolization.

## 2023-02-19 NOTE — Assessment & Plan Note (Addendum)
This very pleasant 72 year old female returns, she is now about 6 weeks status post a series of Orthovisc, we started in June. She has noted good improvements with Orthovisc but also dramatic improvements in her ADLs with physical therapy, she will continue this. She is not a candidate for arthroplasty due to her body mass index. She still has significant pain in her knees that she describes as limiting, so we will going get her a consultation regarding geniculate artery embolization.  She has not yet decided whether she would want to go through with the procedure just yet.

## 2023-02-21 ENCOUNTER — Ambulatory Visit: Payer: Medicare Other

## 2023-02-21 DIAGNOSIS — M6281 Muscle weakness (generalized): Secondary | ICD-10-CM

## 2023-02-21 DIAGNOSIS — R2689 Other abnormalities of gait and mobility: Secondary | ICD-10-CM

## 2023-02-21 DIAGNOSIS — M25562 Pain in left knee: Secondary | ICD-10-CM | POA: Diagnosis not present

## 2023-02-21 DIAGNOSIS — G8929 Other chronic pain: Secondary | ICD-10-CM

## 2023-02-21 DIAGNOSIS — M25561 Pain in right knee: Secondary | ICD-10-CM | POA: Diagnosis not present

## 2023-02-21 NOTE — Therapy (Signed)
OUTPATIENT PHYSICAL THERAPY LOWER EXTREMITY TREATMENT   Patient Name: Natalie Wright MRN: [id number] DOB:09-03-1950, 72 y.o., female Today's Date: 02/21/2023  END OF SESSION:  PT End of Session - 02/21/23 1157     Visit Number 11    Number of Visits 16    Date for PT Re-Evaluation 03/01/23    Authorization Type medicare and tricare    Authorization - Visit Number 11    Progress Note Due on Visit 10    PT Start Time 1153    PT Stop Time 1233    PT Time Calculation (min) 40 min    Activity Tolerance Patient tolerated treatment well    Behavior During Therapy WFL for tasks assessed/performed             Past Medical History:  Diagnosis Date   Allergy    to cats and dogs   Diabetes mellitus    Hiatal hernia    and delayed gastric emptying/ sees [facility] for recurrent diarrhea   History of cardiovascular disorder 12/10/2008   Qualifier: Diagnosis of  By: Linford Arnold MD, Catherine     Hypertension    OSA (obstructive sleep apnea)    CPAP   Pedal edema    Spastic bladder    urology in [place]   TIA (transient ischemic attack)    hx of- sees Dr Rayburn Ma (Neurology)   Past Surgical History:  Procedure Laterality Date   CATARACT EXTRACTION  08/2021   vaginal skin tag removal     Patient Active Problem List   Diagnosis Date Noted   Lumbar spondylosis 07/03/2022   Lumbar spinal stenosis 02/26/2022   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/2022   Bilateral hip pain 01/12/2022   Hearing loss due to cerumen impaction, right 01/12/2022   Hammertoe, bilateral 08/23/2021   History of foot ulcer 08/23/2021   Primary osteoarthritis of both knees 12/09/2020   History of burning pain in leg 08/29/2020   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   Tremor 05/05/2020   Pre-ulcerative corn or callous 01/29/2020   History of transient ischemic attack (TIA) 05/15/2018   Family history of ovarian cancer 04/02/2017   Aortic atherosclerosis (HCC)  08/31/2016   Venous stasis 08/10/2016   Palpitations 07/16/2016   Atrial dilatation, left 07/16/2016   Atrial septal aneurysm 07/16/2016   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   Cataract 04/18/2012   Hyperlipidemia 02/13/2011   Pulmonary nodules 09/22/2010   LEG PAIN 09/12/2009   Sleep apnea 04/13/2009   GERD 03/14/2009   Morbid obesity (HCC) 12/10/2008   Essential hypertension, benign 12/10/2008   RAYNAUD'S DISEASE 12/10/2008   OSTEOARTHRITIS 12/10/2008   URINARY INCONTINENCE 12/10/2008    PCP: [name], MD REFERRING PROVIDER: [name], MD REFERRING DIAG:  M17.0 (ICD-10-CM) - Primary osteoarthritis of both knees   THERAPY DIAG:  Muscle weakness (generalized)  Chronic pain of right knee  Chronic pain of left knee  Other abnormalities of gait and mobility  Rationale for Evaluation and Treatment: Rehabilitation  ONSET DATE: 12/17/22  SUBJECTIVE:  SUBJECTIVE STATEMENT: Patient reports no significant change since last visit, states her pain is the same and her knees are "slipping" less. Patient states she is waiting to be scheduled for a consult on a knee ablation as recommended by MD; she has next gel injection scheduled in November.   PERTINENT HISTORY: Diabetes, HTN, TIA, spinal stenosis, polymyalgia rheumatica PAIN:  Are you having pain? Yes: NPRS scale: 5/10 Pain location: back and knees Pain description: "  snap, crackle, pop" pain, instability at knees where they give out, and neuropathy in her feet Aggravating factors: trying to move after standing, pivoting to step  Relieving factors: nothing *pain also down R hip into thigh  PRECAUTIONS: Fall  WEIGHT BEARING RESTRICTIONS: No  FALLS:  Has patient fallen in last 6 months? Yes. Number of falls 1  LIVING ENVIRONMENT: Lives with: lives alone spouse passed away 06-13-2022 Lives in: House/apartment Stairs: No Has following equipment at home: Quad cane large base and shower bench, grab bars,  scooter (uses husband's scooter)  PLOF:  declining   mobility  PATIENT GOALS: "I'm not sure. I don't know how much therapy will help before knee surgery."  Be able to get in and out of her car easier-- right now, it is so slow and a long process.   OBJECTIVE:  (Measures in this section from initial evaluation unless otherwise noted) PATIENT SURVEYS:  FOTO n/a-- mobility focus SENSATION: Notes neuropathy with some burning in feet, dec'd sensation EDEMA:  Bilateral swelling noted in LEs LOWER EXTREMITY ROM: Active ROM Right eval Left eval  Hip flexion    Hip extension    Hip abduction    Hip adduction    Hip internal rotation    Hip external rotation    Knee flexion 110 110  Knee extension 0 0  Ankle dorsiflexion    Ankle plantarflexion    Ankle inversion    Ankle eversion     (Blank rows = not tested) LOWER EXTREMITY MMT: *can lift against gravity MMT Right eval Left eval Right 02/18/23 Left 02/18/23  Hip flexion 3/5 3/5 4+/5 4+/5  Hip extension      Hip abduction      Hip adduction      Hip internal rotation      Hip external rotation      Knee flexion 3/5 3/5 5/5 5/5  Knee extension 3/5 3/5 5/5 5/5  Ankle dorsiflexion 4/5 4/5    Ankle plantarflexion 3/5 3/5    Ankle inversion      Ankle eversion       (Blank rows = not tested) FUNCTIONAL TESTS:  5 time sit to stand=56.61  Gait speed=0.302 ft/sec *significantly slowed speed* GAIT: Distance walked: with [facility] slowed pace x 75 feet requiring increased time Assistive device utilized: Quad cane large base Level of assistance: Modified independence  slowed pace Comments: patient's quad cane is too high and she switches R and L sides-- PT to address Shoewear: Patient has to use slip on shoes and these slide during gait activities-- PT to address  Bed mobility: Independent climbing into high bed and rolling into bed    [facility] Adult PT Treatment:                                                DATE:  02/21/2023 Therapeutic Exercise: Seated:  Straight leg raises M/L over tall block x10 (B) Isometric LAQ into physioball x 10 reps (B) Isometric HS curl into physioball x 10 reps (B) LAQ + hip add iso (ball squeeze) x10 (B) Slow core marching + abdominal bracing 10x3"  Prone quad with strap 3x30" Therapeutic Activity: Standing alt foot taps on 4" step Walking + stepping over blocks (mid & low) Discussion on how to modify thomas stretch at home (couch vs bed)    [facility] Adult PT Treatment:  DATE: 02/18/23 Therapeutic Exercise: Seated Isometric LAQ into physioball x 10 reps bilat Isometric HS curl into physioball x 10 reps bilat Abdominal bracing x 5 reps See MMT above Therapeutic Activity: Sit<>stand x 5 reps In and out of car practice stepping over yoga blocks and around corner/edge of mat Accompanied patient to her car and worked on different techniques-- the patient demonstrated that she stands and steps laterally with right leg to clear edge of car, then she sits and brings her L leg in. We trialed different techniques (sitting with both legs facing walker, then pivoting to face steering wheel) and patient's current technique appears to work best. Patient lifts RW over her to the passenger side. Gait: With RW x 160 feet nonstop Gait outdoor surfaces with RW mod indep to crosswalk and then on concrete/level parking lot surfaces Self Care: Discussed various ways to complete car transfer    [facility] Adult PT Treatment:                                                DATE: 02/14/2023 Therapeutic Exercise: Seated: Isometric LAQ for quad engagement into orange PB x 10 reps (B) HS isometrics into orange PB x 10 reps (B) Straight leg hip abd raises M/L over block x10 (B) Standing mini squat with eccentric resistance (orange super band) Seated TA bracing (review of handout) Therapeutic Activity: 4" step: step ups x5 leading each leg 4" step:  alt foot taps x10 Lateral weight shifting with glute iso activation --> fwd weight shift with foot on 4" step    PATIENT EDUCATION:  Education details: Updated HEP Person educated: Patient Education method: Explanation, Demonstration, and Handouts Education comprehension: verbalized understanding, returned demonstration, and needs further education  HOME EXERCISE PROGRAM: Access Code: [email or web address] URL: [email or web address] Date: 02/14/2023 Prepared by: Carlynn Herald  Exercises - Supine Knee and Hip Extension with Resistance  - 1 x daily - 7 x weekly - 3 sets - 10 reps - Supine Active Straight Leg Raise  - 1 x daily - 7 x weekly - 1 sets - 10 reps - Bent Knee Fallouts  - 1 x daily - 7 x weekly - 1 sets - 10 reps - Seated Long Arc Quad  - 1 x daily - 7 x weekly - 1 sets - 10 reps - Sit to Stand with Armchair  - 1 x daily - 7 x weekly - 1 sets - 5 reps - Lateral Step Up with Counter Support  - 1 x daily - 7 x weekly - 3 sets - 10 reps - Forward Step Up with Counter Support  - 1 x daily - 7 x weekly - 3 sets - 10 reps - Side Stepping with Counter Support  - 1 x daily - 7 x weekly - 3 sets - 10 reps - Supine Quadriceps Stretch with Strap on Table  - 2 x daily - 7 x weekly - 1 sets - 3 reps - 30-60 sec hold - Seated Transversus Abdominis Bracing  - 1 x daily - 7 x weekly - 1 sets - 10 reps - 5 sec hold  ASSESSMENT:  CLINICAL IMPRESSION: Noted circumductive pattern with second leg when stepping over low blocks; cueing improved gait and awareness. Noted adductor fatigue during seated knee extension with hip add isometric activation (ball squeeze). Discussion with patient on trying thomas stretch on loveseat  versus bed for better alignment and positioning.   OBJECTIVE IMPAIRMENTS: Abnormal gait, decreased activity tolerance, decreased balance, decreased mobility, decreased strength, impaired flexibility, obesity, and pain.   GOALS: Goals reviewed with patient? Yes  SHORT  TERM GOALS: Target date: 01/31/23  The patient will be independent with HEP. Baseline: initiated at eval Goal status:MET  2.  The patient will improve gait speed to > or equal to 0.5 ft/sec. Baseline: 0.302 ft/sec  Goal status: MET-- 0.66 ft/sec  3.  The patient will reduce 5 time sit to stand to < or equal to 48 seconds. Baseline: 56.61 Goal status: MET-- scores 22.5 seconds on 02/11/23   LONG TERM GOALS: Target date: 8 weeks 03/01/23  The patient will be indep with progression of HEP. Baseline: initiated at eval. Goal status: MET  2.  The patient will demo correct use of assistive device. Baseline: switching hands less Goal status: IN PROGRESS  3.  The patient will improve gait speed to > or equal to 0.8 ft/sec Baseline:  0.302 ft/sec Goal status: INITIAL   4.  The patient will perform 5 time sit<>stand in < or equal to 40 seconds. Baseline: 56.61 seconds Goal status: MET -- met on 02/11/23 (see STG)  5.  The patient will report ability to get out of her car and walk into clinic in < or equal to 10 minutes. Baseline:  2-3 min with FWW Goal status: MET  PLAN:  PT FREQUENCY: 2x/week  PT DURATION: 8 weeks  PLANNED INTERVENTIONS: Therapeutic exercises, Therapeutic activity, Neuromuscular re-education, Balance training, Gait training, Patient/Family education, Self Care, and Joint mobilization  PLAN FOR NEXT SESSION:  Continue LE/core strengthening. Continue standing toe taps, STS; progress standing exercises & gait training as tolerated. Renew at end of the month.  Sanjuana Mae, PTA 02/21/2023 12:34 PM

## 2023-02-22 NOTE — Therapy (Signed)
OUTPATIENT PHYSICAL THERAPY LOWER EXTREMITY TREATMENT   Patient Name: Natalie Wright MRN: [id number] DOB:1950/08/13, 72 y.o., female Today's Date: 02/25/2023  END OF SESSION:  PT End of Session - 02/25/23 1110     Visit Number 12    Number of Visits 16    Date for PT Re-Evaluation 03/01/23    Authorization Type medicare and tricare    Progress Note Due on Visit 10    PT Start Time 1110   pt 10 min late   PT Stop Time 1145    PT Time Calculation (min) 35 min    Activity Tolerance Patient tolerated treatment well    Behavior During Therapy WFL for tasks assessed/performed              Past Medical History:  Diagnosis Date   Allergy    to cats and dogs   Diabetes mellitus    Hiatal hernia    and delayed gastric emptying/ sees [facility] for recurrent diarrhea   History of cardiovascular disorder 12/10/2008   Qualifier: Diagnosis of  By: Linford Arnold MD, Catherine     Hypertension    OSA (obstructive sleep apnea)    CPAP   Pedal edema    Spastic bladder    urology in [place]   TIA (transient ischemic attack)    hx of- sees Dr Rayburn Ma (Neurology)   Past Surgical History:  Procedure Laterality Date   CATARACT EXTRACTION  08/2021   vaginal skin tag removal     Patient Active Problem List   Diagnosis Date Noted   Lumbar spondylosis 07/03/2022   Lumbar spinal stenosis 02/26/2022   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/2022   Bilateral hip pain 01/12/2022   Hearing loss due to cerumen impaction, right 01/12/2022   Hammertoe, bilateral 08/23/2021   History of foot ulcer 08/23/2021   Primary osteoarthritis of both knees 12/09/2020   History of burning pain in leg 08/29/2020   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   Tremor 05/05/2020   Pre-ulcerative corn or callous 01/29/2020   History of transient ischemic attack (TIA) 05/15/2018   Family history of ovarian cancer 04/02/2017   Aortic atherosclerosis (HCC) 08/31/2016   Venous  stasis 08/10/2016   Palpitations 07/16/2016   Atrial dilatation, left 07/16/2016   Atrial septal aneurysm 07/16/2016   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   Cataract 04/18/2012   Hyperlipidemia 02/13/2011   Pulmonary nodules 09/22/2010   LEG PAIN 09/12/2009   Sleep apnea 04/13/2009   GERD 03/14/2009   Morbid obesity (HCC) 12/10/2008   Essential hypertension, benign 12/10/2008   RAYNAUD'S DISEASE 12/10/2008   OSTEOARTHRITIS 12/10/2008   URINARY INCONTINENCE 12/10/2008    PCP: [name], MD REFERRING PROVIDER: [name], MD REFERRING DIAG:  M17.0 (ICD-10-CM) - Primary osteoarthritis of both knees   THERAPY DIAG:  Muscle weakness (generalized)  Chronic pain of right knee  Chronic pain of left knee  Other abnormalities of gait and mobility  Rationale for Evaluation and Treatment: Rehabilitation  ONSET DATE: 12/17/22  SUBJECTIVE:  SUBJECTIVE STATEMENT: I think my back injection is wearing off. I'm feeling it across by hip from left to right.   PERTINENT HISTORY: Diabetes, HTN, TIA, spinal stenosis, polymyalgia rheumatica PAIN:  Are you having pain? Yes: NPRS scale: 7 back/knees 5-6/10 Pain location: back (hip to hip) and knees Pain description: "snap, crackle, pop" pain, instability at knees where they give out, and neuropathy in her feet Aggravating factors: trying to move after standing, pivoting to step  Relieving factors: nothing *pain also down R hip into thigh  PRECAUTIONS: Fall  WEIGHT BEARING RESTRICTIONS: No  FALLS:  Has patient fallen in last 6 months? Yes. Number of falls 1  LIVING ENVIRONMENT: Lives with: lives alone spouse passed away May 08, 2022 Lives in: House/apartment Stairs: No Has following equipment at home: Quad cane large base and shower bench, grab bars, scooter (uses husband's scooter)  PLOF:  declining   mobility  PATIENT GOALS: "I'm not sure. I don't know how much therapy will help before knee surgery."  Be able to get  in and out of her car easier-- right now, it is so slow and a long process.   OBJECTIVE:  (Measures in this section from initial evaluation unless otherwise noted) PATIENT SURVEYS:  FOTO n/a-- mobility focus SENSATION: Notes neuropathy with some burning in feet, dec'd sensation EDEMA:  Bilateral swelling noted in LEs LOWER EXTREMITY ROM: Active ROM Right eval Left eval  Hip flexion    Hip extension    Hip abduction    Hip adduction    Hip internal rotation    Hip external rotation    Knee flexion 110 110  Knee extension 0 0  Ankle dorsiflexion    Ankle plantarflexion    Ankle inversion    Ankle eversion     (Blank rows = not tested) LOWER EXTREMITY MMT: *can lift against gravity MMT Right eval Left eval Right 02/18/23 Left 02/18/23  Hip flexion 3/5 3/5 4+/5 4+/5  Hip extension      Hip abduction      Hip adduction      Hip internal rotation      Hip external rotation      Knee flexion 3/5 3/5 5/5 5/5  Knee extension 3/5 3/5 5/5 5/5  Ankle dorsiflexion 4/5 4/5    Ankle plantarflexion 3/5 3/5    Ankle inversion      Ankle eversion       (Blank rows = not tested) FUNCTIONAL TESTS:  5 time sit to stand=56.61  Gait speed=0.302 ft/sec *significantly slowed speed* GAIT: Distance walked: with [facility] slowed pace x 75 feet requiring increased time Assistive device utilized: Quad cane large base Level of assistance: Modified independence  slowed pace Comments: patient's quad cane is too high and she switches R and L sides-- PT to address Shoewear: Patient has to use slip on shoes and these slide during gait activities-- PT to address  Bed mobility: Independent climbing into high bed and rolling into bed  [facility] Adult PT Treatment:                                                DATE: 02/25/2023 Therapeutic Exercise: Straight leg raises M/L over tall block x10 (B) LAQ 3# x 10 reps (B) Slow core marching + abdominal bracing 3#AW 10x3"  Standing HS curl 3# x 10  ea Therapeutic Activity: Standing lateral foot taps to 8 inch step x 10 R only Standing fwd foot taps to 8 inch step B x 10   [facility] Adult PT Treatment:                                                DATE: 02/21/2023 Therapeutic Exercise: Seated:  Straight leg raises M/L over tall block x10 (B) Isometric LAQ into physioball x 10 reps (B) Isometric HS curl into physioball x 10 reps (B) LAQ + hip add iso (ball squeeze) x10 (B) Slow core marching + abdominal bracing 10x3"  Prone quad with strap 3x30" Therapeutic Activity: Standing alt foot taps on 4" step Walking + stepping over blocks (mid & low) Discussion on how to modify thomas stretch at home (couch vs bed)    [facility] Adult PT Treatment:                                                DATE: 02/18/23 Therapeutic Exercise: Seated Isometric LAQ into physioball x 10 reps bilat Isometric HS curl into physioball x 10 reps bilat Abdominal bracing x 5 reps See MMT above Therapeutic Activity: Sit<>stand x 5 reps In and out of car practice stepping over yoga blocks and around corner/edge of mat Accompanied patient to her car and worked on different techniques-- the patient demonstrated that she stands and steps laterally with right leg to clear edge of car, then she sits and brings her L leg in. We trialed different techniques (sitting with both legs facing walker, then pivoting to face steering wheel) and patient's current technique appears to work best. Patient lifts RW over her to the passenger side. Gait: With RW x 160 feet nonstop Gait outdoor surfaces with RW mod indep to crosswalk and then on concrete/level parking lot surfaces Self Care: Discussed various ways to complete car transfer    [facility] Adult PT Treatment:                                                DATE: 02/14/2023 Therapeutic Exercise: Seated: Isometric LAQ for quad engagement into orange PB x 10 reps (B) HS isometrics into orange PB x 10 reps (B) Straight leg hip abd  raises M/L over block x10 (B) Standing mini squat with eccentric resistance (orange super band) Seated TA bracing (review of handout) Therapeutic Activity: 4" step: step ups x5 leading each leg 4" step: alt foot taps x10 Lateral weight shifting with glute iso activation --> fwd weight shift with foot on 4" step    PATIENT EDUCATION:  Education details: Updated HEP Person educated: Patient Education method: Explanation, Demonstration, and Handouts Education comprehension: verbalized understanding, returned demonstration, and needs further education  HOME EXERCISE PROGRAM: Access Code: [email or web address] URL: [email or web address] Date: 02/14/2023 Prepared by: Carlynn Herald  Exercises - Supine Knee and Hip Extension with Resistance  - 1 x daily - 7 x weekly - 3 sets - 10 reps - Supine Active Straight Leg Raise  - 1 x daily - 7 x weekly - 1 sets - 10 reps - Bent Knee Fallouts  - 1 x daily - 7 x weekly - 1 sets - 10 reps - Seated Long Arc Quad  - 1 x daily - 7 x weekly - 1 sets - 10 reps - Sit to Stand with Armchair  - 1 x daily - 7 x weekly - 1 sets - 5 reps - Lateral Step Up with Counter Support  - 1 x daily - 7 x weekly - 3 sets - 10 reps - Forward Step Up with Counter Support  -  1 x daily - 7 x weekly - 3 sets - 10 reps - Side Stepping with Counter Support  - 1 x daily - 7 x weekly - 3 sets - 10 reps - Supine Quadriceps Stretch with Strap on Table  - 2 x daily - 7 x weekly - 1 sets - 3 reps - 30-60 sec hold - Seated Transversus Abdominis Bracing  - 1 x daily - 7 x weekly - 1 sets - 10 reps - 5 sec hold  ASSESSMENT:  CLINICAL IMPRESSION:  Able to progress patient with increased wt today without complaint of pain. We focused on hip strengthening to address car transfers. She is able to lift foot to 8 inch step (challenging) but not higher step stool yet which is similar to car height. Patient was 10 min late today so treatment session shortened.      OBJECTIVE  IMPAIRMENTS: Abnormal gait, decreased activity tolerance, decreased balance, decreased mobility, decreased strength, impaired flexibility, obesity, and pain.   GOALS: Goals reviewed with patient? Yes  SHORT TERM GOALS: Target date: 01/31/23  The patient will be independent with HEP. Baseline: initiated at eval Goal status:MET  2.  The patient will improve gait speed to > or equal to 0.5 ft/sec. Baseline: 0.302 ft/sec  Goal status: MET-- 0.66 ft/sec  3.  The patient will reduce 5 time sit to stand to < or equal to 48 seconds. Baseline: 56.61 Goal status: MET-- scores 22.5 seconds on 02/11/23   LONG TERM GOALS: Target date: 8 weeks 03/01/23  The patient will be indep with progression of HEP. Baseline: initiated at eval. Goal status: MET  2.  The patient will demo correct use of assistive device. Baseline: switching hands less Goal status: IN PROGRESS  3.  The patient will improve gait speed to > or equal to 0.8 ft/sec Baseline:  0.302 ft/sec Goal status: INITIAL   4.  The patient will perform 5 time sit<>stand in < or equal to 40 seconds. Baseline: 56.61 seconds Goal status: MET -- met on 02/11/23 (see STG)  5.  The patient will report ability to get out of her car and walk into clinic in < or equal to 10 minutes. Baseline:  2-3 min with FWW Goal status: MET  PLAN:  PT FREQUENCY: 2x/week  PT DURATION: 8 weeks  PLANNED INTERVENTIONS: Therapeutic exercises, Therapeutic activity, Neuromuscular re-education, Balance training, Gait training, Patient/Family education, Self Care, and Joint mobilization  PLAN FOR NEXT SESSION:  Continue LE/core strengthening. Continue standing toe taps, STS; progress standing exercises & gait training as tolerated. Renew at end of the month.  Solon Palm, PT  02/25/2023 1:01 PM

## 2023-02-25 ENCOUNTER — Ambulatory Visit: Payer: Medicare Other | Admitting: Physical Therapy

## 2023-02-25 ENCOUNTER — Encounter: Payer: Self-pay | Admitting: Physical Therapy

## 2023-02-25 DIAGNOSIS — M6281 Muscle weakness (generalized): Secondary | ICD-10-CM | POA: Diagnosis not present

## 2023-02-25 DIAGNOSIS — R2689 Other abnormalities of gait and mobility: Secondary | ICD-10-CM

## 2023-02-25 DIAGNOSIS — M25562 Pain in left knee: Secondary | ICD-10-CM | POA: Diagnosis not present

## 2023-02-25 DIAGNOSIS — M25561 Pain in right knee: Secondary | ICD-10-CM | POA: Diagnosis not present

## 2023-02-25 DIAGNOSIS — G8929 Other chronic pain: Secondary | ICD-10-CM | POA: Diagnosis not present

## 2023-02-26 ENCOUNTER — Ambulatory Visit (INDEPENDENT_AMBULATORY_CARE_PROVIDER_SITE_OTHER): Payer: Medicare Other | Admitting: Family Medicine

## 2023-02-26 ENCOUNTER — Encounter: Payer: Self-pay | Admitting: Family Medicine

## 2023-02-26 VITALS — BP 92/70 | HR 71 | Ht 61.0 in | Wt 342.0 lb

## 2023-02-26 DIAGNOSIS — I10 Essential (primary) hypertension: Secondary | ICD-10-CM | POA: Diagnosis not present

## 2023-02-26 DIAGNOSIS — J069 Acute upper respiratory infection, unspecified: Secondary | ICD-10-CM

## 2023-02-26 DIAGNOSIS — E1149 Type 2 diabetes mellitus with other diabetic neurological complication: Secondary | ICD-10-CM | POA: Diagnosis not present

## 2023-02-26 DIAGNOSIS — E113213 Type 2 diabetes mellitus with mild nonproliferative diabetic retinopathy with macular edema, bilateral: Secondary | ICD-10-CM | POA: Diagnosis not present

## 2023-02-26 DIAGNOSIS — L03316 Cellulitis of umbilicus: Secondary | ICD-10-CM

## 2023-02-26 DIAGNOSIS — Z7985 Long-term (current) use of injectable non-insulin antidiabetic drugs: Secondary | ICD-10-CM | POA: Diagnosis not present

## 2023-02-26 DIAGNOSIS — Z7984 Long term (current) use of oral hypoglycemic drugs: Secondary | ICD-10-CM

## 2023-02-26 DIAGNOSIS — Z23 Encounter for immunization: Secondary | ICD-10-CM

## 2023-02-26 DIAGNOSIS — E11319 Type 2 diabetes mellitus with unspecified diabetic retinopathy without macular edema: Secondary | ICD-10-CM | POA: Insufficient documentation

## 2023-02-26 LAB — POCT GLYCOSYLATED HEMOGLOBIN (HGB A1C): Hemoglobin A1C: 6.6 % — AB (ref 4.0–5.6)

## 2023-02-26 LAB — POCT UA - MICROALBUMIN
Albumin/Creatinine Ratio, Urine, POC: <30
Creatinine, POC: 100 mg/dL
Microalbumin Ur, POC: 30 mg/L

## 2023-02-26 MED ORDER — TRULICITY 4.5 MG/0.5ML ~~LOC~~ SOAJ
4.5000 mg | SUBCUTANEOUS | 1 refills | Status: DC
Start: 2023-02-26 — End: 2023-06-04

## 2023-02-26 NOTE — Assessment & Plan Note (Signed)
Last eye appt in June. Dr. Sherlyn Hay

## 2023-02-26 NOTE — Assessment & Plan Note (Signed)
Pressure is just a little on the low side today though she also brought in a home log and on her home log about half the blood pressures are actually above 130.  Might have her bring her home cuff in to confirm.

## 2023-02-26 NOTE — Progress Notes (Unsigned)
Established Patient Office Visit  Subjective   Patient ID: Natalie Wright, female    DOB: [birth year]  Age: 72 y.o. MRN: [id number]  Chief Complaint  Patient presents with   Diabetes   Hypertension    HPI  Diabetes - no hypoglycemic events. No wounds or sores that are not healing well. No increased thirst or urination. Checking glucose at home. Taking medications as prescribed without any side effects. She was finally able to get the Trulicity 3mg .  HTN - she is doing well on inc dose of lisinopril 20mg .  Kept home BP log  Raspy throat and cough for a few days. Sister with similar sxs. No fever or chills midl cough.   Reports recurring rash and driange form her belly button since surgery for tubal years ago. It is red and inflammed currently.    She has been going to physical therapy very regularly and follows with Dr. Karie Schwalbe for her knee arthritis currently.  Filed Weights   02/26/23 1413  Weight: (!) 342 lb (155.1 kg)       ROS    Objective:     BP 92/70   Pulse 71   Ht 5\' 1"  (1.549 m)   Wt (!) 342 lb (155.1 kg)   SpO2 96%   BMI 64.62 kg/m    Physical Exam Vitals and nursing note reviewed.  Constitutional:      Appearance: Normal appearance.  HENT:     Head: Normocephalic and atraumatic.     Right Ear: Tympanic membrane, ear canal and external ear normal. There is no impacted cerumen.     Left Ear: Tympanic membrane, ear canal and external ear normal. There is no impacted cerumen.     Nose: Nose normal.     Mouth/Throat:     Pharynx: Oropharynx is clear.  Eyes:     Conjunctiva/sclera: Conjunctivae normal.  Cardiovascular:     Rate and Rhythm: Normal rate and regular rhythm.  Pulmonary:     Effort: Pulmonary effort is normal.     Breath sounds: Normal breath sounds.  Musculoskeletal:     Cervical back: Neck supple. No tenderness.  Lymphadenopathy:     Cervical: No cervical adenopathy.  Skin:    General: Skin is warm and dry.  Neurological:      Mental Status: She is alert and oriented to person, place, and time.  Psychiatric:        Mood and Affect: Mood normal.      Results for orders placed or performed in visit on 02/26/23  CMP14+EGFR  Result Value Ref Range   Glucose 99 70 - 99 mg/dL   BUN 18 8 - 27 mg/dL   Creatinine, Ser 1.88 0.57 - 1.00 mg/dL   eGFR 82 >41 YS/AYT/0.16   BUN/Creatinine Ratio 23 12 - 28   Sodium 139 134 - 144 mmol/L   Potassium 3.5 3.5 - 5.2 mmol/L   Chloride 96 96 - 106 mmol/L   CO2 27 20 - 29 mmol/L   Calcium 9.0 8.7 - 10.3 mg/dL   Total Protein 6.5 6.0 - 8.5 g/dL   Albumin 4.2 3.8 - 4.8 g/dL   Globulin, Total 2.3 1.5 - 4.5 g/dL   Bilirubin Total 0.3 0.0 - 1.2 mg/dL   Alkaline Phosphatase 91 44 - 121 IU/L   AST 16 0 - 40 IU/L   ALT 13 0 - 32 IU/L  POCT HgB A1C  Result Value Ref Range   Hemoglobin A1C 6.6 (A) 4.0 - 5.6 %  HbA1c POC (<> result, manual entry)     HbA1c, POC (prediabetic range)     HbA1c, POC (controlled diabetic range)    POCT UA - Microalbumin  Result Value Ref Range   Microalbumin Ur, POC 30 mg/L   Creatinine, POC 100 mg/dL   Albumin/Creatinine Ratio, Urine, POC <30       The 10-year ASCVD risk score (Arnett DK, et al., 2019) is: 13.4%    Assessment & Plan:   Problem List Items Addressed This Visit       Cardiovascular and Mediastinum   Essential hypertension, benign - Primary    Pressure is just a little on the low side today though she also brought in a home log and on her home log about half the blood pressures are actually above 130.  Might have her bring her home cuff in to confirm.      Relevant Orders   CMP14+EGFR (Completed)     Endocrine   Type 2 diabetes mellitus with neurological complications (HCC)    A1c looks absolutely phenomenal today at 6.6 down from 8.0.  Really truly fantastic progress.  Her A1c has improved significantly as her weight has also decreased.  Her goal is still to be able to get knee replacement surgery.  Doing really well  with 3 mg of Trulicity.  Discussed going up to 4.5 she has taken 4.5 before but we had significant difficulty with availability in our local area.  So she would like to try to see if we can get it again and to continue to help with appetite suppression and weight loss      Relevant Medications   Dulaglutide (TRULICITY) 4.5 MG/0.5ML SOPN   Other Relevant Orders   POCT HgB A1C (Completed)   POCT UA - Microalbumin (Completed)   CMP14+EGFR (Completed)   Diabetic retinopathy (HCC)    Last eye appt in June. Dr. Sherlyn Hay       Relevant Medications   Dulaglutide (TRULICITY) 4.5 MG/0.5ML SOPN   Other Visit Diagnoses     Viral upper respiratory tract infection       Cellulitis of umbilicus       Relevant Orders   Wound culture      URI - recommend symptomatic care. Call if not better in one week.    Cellulitis, local-wound culture obtained. 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  Benefit from mupirocin ointment to use as needed but we will start by getting the culture.  Return in about 3 months (around 05/29/2023) for Diabetes follow-up, Hypertension.   I spent 40 minutes on the day of the encounter to include pre-visit record review, face-to-face time with the patient and post visit ordering of test.   Natalie Gasser, MD

## 2023-02-27 LAB — CMP14+EGFR
ALT: 13 IU/L (ref 0–32)
AST: 16 IU/L (ref 0–40)
Albumin: 4.2 g/dL (ref 3.8–4.8)
Alkaline Phosphatase: 91 IU/L (ref 44–121)
BUN/Creatinine Ratio: 23 (ref 12–28)
BUN: 18 mg/dL (ref 8–27)
Bilirubin Total: 0.3 mg/dL (ref 0.0–1.2)
CO2: 27 mmol/L (ref 20–29)
Calcium: 9 mg/dL (ref 8.7–10.3)
Chloride: 96 mmol/L (ref 96–106)
Creatinine, Ser: 0.77 mg/dL (ref 0.57–1.00)
Globulin, Total: 2.3 g/dL (ref 1.5–4.5)
Glucose: 99 mg/dL (ref 70–99)
Potassium: 3.5 mmol/L (ref 3.5–5.2)
Sodium: 139 mmol/L (ref 134–144)
Total Protein: 6.5 g/dL (ref 6.0–8.5)
eGFR: 82 mL/min/{1.73_m2} (ref >59)

## 2023-02-27 NOTE — Progress Notes (Signed)
Hi Natalie Wright, no excess protein in the urine which is fantastic.  Glucose was 99 which is awesome!  And the rest of your metabolic panel looked great.

## 2023-02-27 NOTE — Assessment & Plan Note (Signed)
A1c looks absolutely phenomenal today at 6.6 down from 8.0.  Really truly fantastic progress.  Her A1c has improved significantly as her weight has also decreased.  Her goal is still to be able to get knee replacement surgery.  Doing really well with 3 mg of Trulicity.  Discussed going up to 4.5 she has taken 4.5 before but we had significant difficulty with availability in our local area.  So she would like to try to see if we can get it again and to continue to help with appetite suppression and weight loss

## 2023-02-28 ENCOUNTER — Telehealth: Payer: Self-pay | Admitting: Family Medicine

## 2023-02-28 ENCOUNTER — Ambulatory Visit: Payer: Medicare Other

## 2023-02-28 NOTE — Telephone Encounter (Signed)
Pt called in this morning stating that she was in office on Tues. She tested positive on Wed for Covid. Due to her medications and medical condition she is wondering if you will have any specific instructions for her to follow?

## 2023-03-01 LAB — WOUND CULTURE

## 2023-03-01 MED ORDER — NIRMATRELVIR/RITONAVIR (PAXLOVID)TABLET: 3.0000 | ORAL_TABLET | Freq: Two times a day (BID) | ORAL | 0 refills | Status: AC

## 2023-03-01 MED ORDER — AMOXICILLIN-POT CLAVULANATE 875-125 MG PO TABS: 1.0000 | ORAL_TABLET | Freq: Two times a day (BID) | ORAL | 0 refills | Status: DC

## 2023-03-01 NOTE — Telephone Encounter (Signed)
Patient informed. Wanted to let Dr. Linford Arnold know- States she has had some moderate blood with cough  and drainage No fever  Right ear buzzing sound  She states feels like cold symptoms

## 2023-03-01 NOTE — Telephone Encounter (Signed)
Paxlovid sent to [facility].

## 2023-03-01 NOTE — Addendum Note (Signed)
Addended by: [name] D on: 03/01/2023 08:38 AM   Modules accepted: Orders

## 2023-03-01 NOTE — Progress Notes (Signed)
Hi Natalie Wright, the culture from the bellybutton area came back positive for a bacteria called Klebsiella.  Send over an antibiotic for you to try to knock it down.  I know you are sick right now with COVID so if you want to wait until you finish the Paxlovid for that and then start the antibiotic that is perfectly fine.

## 2023-03-05 ENCOUNTER — Ambulatory Visit: Payer: Medicare Other

## 2023-03-07 ENCOUNTER — Ambulatory Visit (INDEPENDENT_AMBULATORY_CARE_PROVIDER_SITE_OTHER): Payer: Medicare Other | Admitting: Podiatry

## 2023-03-07 ENCOUNTER — Encounter (INDEPENDENT_AMBULATORY_CARE_PROVIDER_SITE_OTHER): Payer: Medicare Other | Admitting: Sports Medicine

## 2023-03-07 DIAGNOSIS — L84 Corns and callosities: Secondary | ICD-10-CM | POA: Diagnosis not present

## 2023-03-07 DIAGNOSIS — B351 Tinea unguium: Secondary | ICD-10-CM | POA: Diagnosis not present

## 2023-03-07 DIAGNOSIS — Z7901 Long term (current) use of anticoagulants: Secondary | ICD-10-CM

## 2023-03-07 DIAGNOSIS — E1149 Type 2 diabetes mellitus with other diabetic neurological complication: Secondary | ICD-10-CM

## 2023-03-07 DIAGNOSIS — M79674 Pain in right toe(s): Secondary | ICD-10-CM

## 2023-03-07 DIAGNOSIS — M48061 Spinal stenosis, lumbar region without neurogenic claudication: Secondary | ICD-10-CM

## 2023-03-07 DIAGNOSIS — M79675 Pain in left toe(s): Secondary | ICD-10-CM

## 2023-03-07 NOTE — Telephone Encounter (Signed)

## 2023-03-07 NOTE — Progress Notes (Signed)
Subjective: Chief Complaint  Patient presents with   Nail Problem    Diabetic Foot Care-nail trim    Callouses    Callus trim      72 y.o. returns the office today for painful, elongated, thickened toenails which they cannot trim herself.  She denies any open sores.  No open lesions.  She has no other concerns today.  She is on Plavix.  PCP: Agapito Games, MD Last seen 02/26/2023 A1c: 8 on 09/10/2022  Objective: AAO 3, NAD DP/PT pulses palpable, CRT less than 3 seconds Protective sensation decreased with Simms Weinstein monofilament Nails hypertrophic, dystrophic, elongated, brittle, discolored 10. There is tenderness overlying the nails 1-5 bilaterally. There is no surrounding erythema or drainage along the nail sites. Hyperkeratotic lesion right foot submetatarsal 5, metatarsal base as well as the heel.  No ulcerations noted. No other areas of discomfort. Decrease in arch upon weightbearing.  No open lesions or other pre-ulcerative lesions are identified. No pain with calf compression, swelling, warmth, erythema.  Assessment: Patient presents with symptomatic onychomycosis, hyperkeratotic lesions  Plan: -Treatment options including alternatives, risks, complications were discussed -Nails sharply debrided 10 without complication/bleeding. -Hyperkeratotic lesion sharply debrided x 4 without any complications or bleeding.  Recommend moisturizer daily.  -Continue offloading inserts. -Discussed daily foot inspection. If there are any changes, to call the office immediately.  -Follow-up in 3 months or sooner if any problems are to arise. In the meantime, encouraged to call the office with any questions, concerns, changes symptoms.  *During today's appointment she did get nauseated.  This did not last very long as she does not report any chest pain or shortness of breath.  She states that she has had chronic GI issues as it happens occasionally.  She states that she has  follow-up for this.  Her blood sugar was within normal limits today.  Ovid Curd, DPM

## 2023-03-08 ENCOUNTER — Encounter: Payer: Self-pay | Admitting: Rehabilitative and Restorative Service Providers"

## 2023-03-08 ENCOUNTER — Ambulatory Visit: Payer: Medicare Other | Attending: Sports Medicine | Admitting: Rehabilitative and Restorative Service Providers"

## 2023-03-08 ENCOUNTER — Ambulatory Visit
Admission: RE | Admit: 2023-03-08 | Discharge: 2023-03-08 | Disposition: A | Discharge status: Home / Self Care | Payer: Medicare Other | Source: Ambulatory Visit | Attending: Sports Medicine | Admitting: Sports Medicine

## 2023-03-08 DIAGNOSIS — G8929 Other chronic pain: Secondary | ICD-10-CM | POA: Diagnosis present

## 2023-03-08 DIAGNOSIS — M6281 Muscle weakness (generalized): Secondary | ICD-10-CM | POA: Diagnosis not present

## 2023-03-08 DIAGNOSIS — M170 Bilateral primary osteoarthritis of knee: Secondary | ICD-10-CM

## 2023-03-08 DIAGNOSIS — M25562 Pain in left knee: Secondary | ICD-10-CM | POA: Diagnosis present

## 2023-03-08 DIAGNOSIS — M25561 Pain in right knee: Secondary | ICD-10-CM | POA: Insufficient documentation

## 2023-03-08 DIAGNOSIS — R2689 Other abnormalities of gait and mobility: Secondary | ICD-10-CM | POA: Insufficient documentation

## 2023-03-08 HISTORY — PX: IR RADIOLOGIST EVAL & MGMT: IMG5224

## 2023-03-08 NOTE — Therapy (Addendum)
OUTPATIENT PHYSICAL THERAPY LOWER EXTREMITY TREATMENT and RECERTIFICATION  Patient Name: Natalie Wright MRN: [id number] DOB:[birth year], 72 y.o., female Today's Date: 03/08/2023   03/08/23 1108  PT Visits / Re-Eval  Visit Number 13  Number of Visits 29  Date for PT Re-Evaluation 05/07/23  Authorization  Authorization Type medicare and tricare  Progress Note Due on Visit 10  PT Time Calculation  PT Start Time 1107  PT Stop Time 1146  PT Time Calculation (min) 39 min  PT - End of Session  Activity Tolerance Patient tolerated treatment well  Behavior During Therapy WFL for tasks assessed/performed     Past Medical History:  Diagnosis Date   Allergy    to cats and dogs   Diabetes mellitus    Hiatal hernia    and delayed gastric emptying/ sees [facility] for recurrent diarrhea   History of cardiovascular disorder 12/10/2008   Qualifier: Diagnosis of  By: Linford Arnold MD, Catherine     Hypertension    OSA (obstructive sleep apnea)    CPAP   Pedal edema    Spastic bladder    urology in [place]   TIA (transient ischemic attack)    hx of- sees Dr Rayburn Ma (Neurology)   Past Surgical History:  Procedure Laterality Date   CATARACT EXTRACTION  08/2021   vaginal skin tag removal     Patient Active Problem List   Diagnosis Date Noted   Diabetic retinopathy (HCC) 02/26/2023   Lumbar spondylosis 07/03/2022   Lumbar spinal stenosis 02/26/2022   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/2022   Bilateral hip pain 01/12/2022   Hearing loss due to cerumen impaction, right 01/12/2022   Hammertoe, bilateral 08/23/2021   History of foot ulcer 08/23/2021   Primary osteoarthritis of both knees 12/09/2020   History of burning pain in leg 08/29/2020   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   Tremor 05/05/2020   Pre-ulcerative corn or callous 01/29/2020   History of transient ischemic attack (TIA) 05/15/2018   Family history of ovarian cancer 04/02/2017    Aortic atherosclerosis (HCC) 08/31/2016   Venous stasis 08/10/2016   Palpitations 07/16/2016   Atrial dilatation, left 07/16/2016   Atrial septal aneurysm 07/16/2016   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   Cataract 04/18/2012   Hyperlipidemia 02/13/2011   Pulmonary nodules 09/22/2010   LEG PAIN 09/12/2009   Sleep apnea 04/13/2009   GERD 03/14/2009   Morbid obesity (HCC) 12/10/2008   Essential hypertension, benign 12/10/2008   RAYNAUD'S DISEASE 12/10/2008   OSTEOARTHRITIS 12/10/2008   URINARY INCONTINENCE 12/10/2008    PCP: [name], MD REFERRING PROVIDER: [name], MD REFERRING DIAG:  M17.0 (ICD-10-CM) - Primary osteoarthritis of both knees   THERAPY DIAG:  Muscle weakness (generalized)  Chronic pain of right knee  Chronic pain of left knee  Other abnormalities of gait and mobility  Rationale for Evaluation and Treatment: Rehabilitation  ONSET DATE: 12/17/22  SUBJECTIVE:  SUBJECTIVE STATEMENT: I think my back injection is wearing off. I'm feeling it across by hip from left to right. She had Covid and missed therapy for the past 10 days. The patient reports she still has some pain that is worse after standing in one spot.  PERTINENT HISTORY: Diabetes, HTN, TIA, spinal stenosis, polymyalgia rheumatica PAIN:  Are you having pain? Yes: NPRS scale: 5/10 most days/10 Pain location: back (hip to hip) and knees Pain description: "snap, crackle, pop" pain, instability at knees where they give out, and neuropathy in her feet Aggravating factors: trying  to move after standing, pivoting to step  Relieving factors: nothing  PRECAUTIONS: Fall  WEIGHT BEARING RESTRICTIONS: No  FALLS:  Has patient fallen in last 6 months? Yes. Number of falls 1  LIVING ENVIRONMENT: Lives with: lives alone spouse passed away 06-06-2022 Lives in: House/apartment Stairs: No Has following equipment at home: Quad cane large base and shower bench, grab bars, scooter (uses  husband's scooter)  PLOF:  declining   mobility  PATIENT GOALS: "I'm not sure. I don't know how much therapy will help before knee surgery."  Be able to get in and out of her car easier-- right now, it is so slow and a long process.   OBJECTIVE:  (Measures in this section from initial evaluation unless otherwise noted) PATIENT SURVEYS:  FOTO n/a-- mobility focus SENSATION: Notes neuropathy with some burning in feet, dec'd sensation EDEMA:  Bilateral swelling noted in LEs LOWER EXTREMITY ROM: Active ROM Right eval Left eval  Hip flexion    Hip extension    Hip abduction    Hip adduction    Hip internal rotation    Hip external rotation    Knee flexion 110 110  Knee extension 0 0  Ankle dorsiflexion    Ankle plantarflexion    Ankle inversion    Ankle eversion     (Blank rows = not tested) LOWER EXTREMITY MMT: *can lift against gravity MMT Right eval Left eval Right 02/18/23 Left 02/18/23  Hip flexion 3/5 3/5 4+/5 4+/5  Hip extension      Hip abduction      Hip adduction      Hip internal rotation      Hip external rotation      Knee flexion 3/5 3/5 5/5 5/5  Knee extension 3/5 3/5 5/5 5/5  Ankle dorsiflexion 4/5 4/5    Ankle plantarflexion 3/5 3/5    Ankle inversion      Ankle eversion       (Blank rows = not tested) FUNCTIONAL TESTS:  5 time sit to stand=56.61  Gait speed=0.302 ft/sec *significantly slowed speed* GAIT: Distance walked: with [facility] slowed pace x 75 feet requiring increased time Assistive device utilized: Quad cane large base Level of assistance: Modified independence  slowed pace Comments: patient's quad cane is too high and she switches R and L sides-- PT to address Shoewear: Patient has to use slip on shoes and these slide during gait activities-- PT to address  Bed mobility: Independent climbing into high bed and rolling into bed  [facility] Adult PT Treatment:                                                DATE: 03/08/23 Therapeutic Exercise: Sit  to stand x 10 reps mod indep Seated Knee isometrics small physioball extension and flexion x 10 reps x 3 second holds Standing Lateral weight shift with bilat UE support Alternating foot tapping with bilat UE support Heel raises x 10 reps with bilat UE support Supine SLR x 10 reps R and L sides Marching x 10 reps alternating R and L sides Gait: Ambulation x 120 feet x 2 reps   [facility] Adult PT Treatment:  DATE: 02/25/2023 Therapeutic Exercise: Straight leg raises M/L over tall block x10 (B) LAQ 3# x 10 reps (B) Slow core marching + abdominal bracing 3#AW 10x3"  Standing HS curl 3# x 10 ea Therapeutic Activity: Standing lateral foot taps to 8 inch step x 10 R only Standing fwd foot taps to 8 inch step B x 10   [facility] Adult PT Treatment:                                                DATE: 02/21/2023 Therapeutic Exercise: Seated:  Straight leg raises M/L over tall block x10 (B) Isometric LAQ into physioball x 10 reps (B) Isometric HS curl into physioball x 10 reps (B) LAQ + hip add iso (ball squeeze) x10 (B) Slow core marching + abdominal bracing 10x3"  Prone quad with strap 3x30" Therapeutic Activity: Standing alt foot taps on 4" step Walking + stepping over blocks (mid & low) Discussion on how to modify thomas stretch at home (couch vs bed)    [facility] Adult PT Treatment:                                                DATE: 02/18/23 Therapeutic Exercise: Seated Isometric LAQ into physioball x 10 reps bilat Isometric HS curl into physioball x 10 reps bilat Abdominal bracing x 5 reps See MMT above Therapeutic Activity: Sit<>stand x 5 reps In and out of car practice stepping over yoga blocks and around corner/edge of mat Accompanied patient to her car and worked on different techniques-- the patient demonstrated that she stands and steps laterally with right leg to clear edge of car, then she sits and brings her L leg in. We trialed  different techniques (sitting with both legs facing walker, then pivoting to face steering wheel) and patient's current technique appears to work best. Patient lifts RW over her to the passenger side. Gait: With RW x 160 feet nonstop Gait outdoor surfaces with RW mod indep to crosswalk and then on concrete/level parking lot surfaces Self Care: Discussed various ways to complete car transfer    [facility] Adult PT Treatment:                                                DATE: 02/14/2023 Therapeutic Exercise: Seated: Isometric LAQ for quad engagement into orange PB x 10 reps (B) HS isometrics into orange PB x 10 reps (B) Straight leg hip abd raises M/L over block x10 (B) Standing mini squat with eccentric resistance (orange super band) Seated TA bracing (review of handout) Therapeutic Activity: 4" step: step ups x5 leading each leg 4" step: alt foot taps x10 Lateral weight shifting with glute iso activation --> fwd weight shift with foot on 4" step    PATIENT EDUCATION:  Education details: Updated HEP Person educated: Patient Education method: Explanation, Demonstration, and Handouts Education comprehension: verbalized understanding, returned demonstration, and needs further education  HOME EXERCISE PROGRAM: Access Code: [email or web address] URL: [email or web address] Date: 02/14/2023 Prepared by: Carlynn Herald  Exercises - Supine Knee and Hip Extension with Resistance  - 1 x daily -  7 x weekly - 3 sets - 10 reps - Supine Active Straight Leg Raise  - 1 x daily - 7 x weekly - 1 sets - 10 reps - Bent Knee Fallouts  - 1 x daily - 7 x weekly - 1 sets - 10 reps - Seated Long Arc Quad  - 1 x daily - 7 x weekly - 1 sets - 10 reps - Sit to Stand with Armchair  - 1 x daily - 7 x weekly - 1 sets - 5 reps - Lateral Step Up with Counter Support  - 1 x daily - 7 x weekly - 3 sets - 10 reps - Forward Step Up with Counter Support  - 1 x daily - 7 x weekly - 3 sets - 10 reps - Side Stepping  with Counter Support  - 1 x daily - 7 x weekly - 3 sets - 10 reps - Supine Quadriceps Stretch with Strap on Table  - 2 x daily - 7 x weekly - 1 sets - 3 reps - 30-60 sec hold - Seated Transversus Abdominis Bracing  - 1 x daily - 7 x weekly - 1 sets - 10 reps - 5 sec hold  ASSESSMENT:  CLINICAL IMPRESSION: The patient is reporting increased low back pain and feels she is due for an injection. She has met STGs and LTGs established at initial evaluation. We have discussed her gait speed and safety is improved with the use of the RW. PT plans to continue to progress working on improving mobility, dec'ing fall risk, reducing pain, and improving strength.   OBJECTIVE IMPAIRMENTS: Abnormal gait, decreased activity tolerance, decreased balance, decreased mobility, decreased strength, impaired flexibility, obesity, and pain.   GOALS: Goals reviewed with patient? Yes  SHORT TERM GOALS: Target date: 01/31/23  The patient will be independent with HEP. Baseline: initiated at eval Goal status:MET  2.  The patient will improve gait speed to > or equal to 0.5 ft/sec. Baseline: 0.302 ft/sec  Goal status: MET-- 0.66 ft/sec  3.  The patient will reduce 5 time sit to stand to < or equal to 48 seconds. Baseline: 56.61 Goal status: MET-- scores 22.5 seconds on 02/11/23   LONG TERM GOALS: Target date: 8 weeks 03/01/23  The patient will be indep with progression of HEP. Baseline: initiated at eval. Goal status: MET  2.  The patient will demo correct use of assistive device. Baseline: switching hands less Goal status: MET  3.  The patient will improve gait speed to > or equal to 0.8 ft/sec Baseline:  0.302 ft/sec Goal status:  MET 1.11 ft/sec on 03/08/23--   4.  The patient will perform 5 time sit<>stand in < or equal to 40 seconds. Baseline: 56.61 seconds Goal status: MET -- met on 02/11/23 (see STG)  5.  The patient will report ability to get out of her car and walk into clinic in < or equal to 10  minutes. Baseline:  2-3 min with FWW Goal status: MET   UPDATED GOALS: SHORT TERM GOALS: Target date: 04/07/23  1.  The patient will report dec'd pain in R leg while driving.  Baseline: Notes pain with being in the car has increased.  Goal status: REVISED  LONG TERM GOALS: Target date: 05/07/23  The patient will be indep with progression of HEP. Baseline: initiated at eval. Goal status: REVISED   2.   The patient will improve gait speed to > or equal to 1.3  ft/sec to demo transition to  community ambulation status. Baseline:  0.302 ft/sec at eval and 1.11 ft/sec re-eval Goal status:  REVISED  4.  The patient will perform 5 time sit<>stand in < or equal to 20 seconds. Baseline: 56.61 seconds AT EVAL, 22.5 sec re-eval Goal status: REVISED  5.  The patient will report ability to stand at the kitchen counter x 10 minutes without sensation of not being able to lift her legs after standing. Baseline:  After standing in one spot, patient reports unable to lift her leg to walk Goal status: REVISED    PLAN:  PT FREQUENCY: 2x/week  PT DURATION: 8 weeks  PLANNED INTERVENTIONS: Therapeutic exercises, Therapeutic activity, Neuromuscular re-education, Balance training, Gait training, Patient/Family education, Self Care, and Joint mobilization  PLAN FOR NEXT SESSION:  Continue LE/core strengthening. Continue standing toe taps, STS; progress standing exercises & gait training as tolerated.    Armanii Urbanik, PT 03/08/2023

## 2023-03-08 NOTE — Consult Note (Signed)
Chief Complaint: Patient was seen in virtual telephone consultation today for bilateral knee pain  Referring Physician(s): Rodney Langton, MD  History of Present Illness: Natalie Wright is a 72 y.o. female with history of severe bilateral knee pain related to osteoarthritis.  Her pertinent cormorbidities include obesity, diabetes mellitus, hypertension, obstructive sleep apnea, history of TIA.    She describes her knee pain worst when standing. The pain gets significantly worse when initially lifting leg to ambulate.  Her right knee pain is slightly worse than the left.  She is currently in physical therapy.   Has had series of hyaluronic acid injections with Dr. Benjamin Stain with some improvement but pain persists and is lifestyle limiting.  She is not a candidate for arthroplasty due to elevated body mass index.  Has lost around 120 lbs, hoping to lose about 70 more lbs to get to 200 lbs for TKA.    WOMAC Pain Score:  56/96 VAS Pain Score:  8/10    Past Medical History:  Diagnosis Date   Allergy    to cats and dogs   Diabetes mellitus    Hiatal hernia    and delayed gastric emptying/ sees [facility] for recurrent diarrhea   History of cardiovascular disorder 12/10/2008   Qualifier: Diagnosis of  By: Linford Arnold MD, Catherine     Hypertension    OSA (obstructive sleep apnea)    CPAP   Pedal edema    Spastic bladder    urology in [place]   TIA (transient ischemic attack)    hx of- sees Dr Rayburn Ma (Neurology)    Past Surgical History:  Procedure Laterality Date   CATARACT EXTRACTION  08/2021   vaginal skin tag removal      Allergies: Atorvastatin, Beclomethasone, Cefaclor, Pioglitazone, Pravastatin, Sulfa antibiotics, Cephalexin, Lipitor [atorvastatin calcium], and Statins  Medications: Prior to Admission medications   Medication Sig Start Date End Date Taking? Authorizing Provider  AMBULATORY NON FORMULARY MEDICATION Medication Name: custom fit knee high  compression stocking with 15-20 mmHg pressure. Open toe preferred. 08/10/16   Agapito Games, MD  amoxicillin-clavulanate (AUGMENTIN) 875-125 MG tablet Take 1 tablet by mouth 2 (two) times daily. 03/01/23   Agapito Games, MD  aspirin 325 MG tablet Take 325 mg by mouth daily.    [provider]  cetirizine (ZYRTEC) 10 MG tablet Take 10 mg by mouth daily.    [provider]  chlorthalidone (HYGROTON) 25 MG tablet Take 1 tablet (25 mg total) by mouth every other day. 10/04/22   Agapito Games, MD  clopidogrel (PLAVIX) 75 MG tablet Take 1 tablet (75 mg total) by mouth daily. 09/10/22   Agapito Games, MD  diltiazem (CARDIZEM CD) 360 MG 24 hr capsule TAKE 1 CAPSULE(360 MG) BY MOUTH DAILY 09/30/17   Agapito Games, MD  Dulaglutide (TRULICITY) 4.5 MG/0.5ML SOPN Inject 4.5 mg as directed once a week. 02/26/23   Agapito Games, MD  DULoxetine (CYMBALTA) 30 MG capsule Take 1 capsule (30 mg total) by mouth daily. For musculoskeletal pain 12/07/22   Agapito Games, MD  Ferrous Sulfate Dried 200 (65 Fe) MG TABS Take 1 tablet by mouth daily.    [provider]  fluconazole (DIFLUCAN) 150 MG tablet TAKE 1 TABLET BY MOUTH ONCE 09/10/22   Agapito Games, MD  insulin glargine (LANTUS) 100 UNIT/ML injection INJECT 35 UNITS UNDER THE SKIN DAILY 09/10/22   Agapito Games, MD  Insulin Pen Needle (NOVOFINE) 30G X 8 MM  MISC For use each time injection for insulin 02/10/16   Agapito Games, MD  JARDIANCE 25 MG TABS tablet TAKE 1 TABLET DAILY 03/27/22   Agapito Games, MD  latanoprost (XALATAN) 0.005 % ophthalmic solution 1 drop.    [provider]  lisinopril (ZESTRIL) 20 MG tablet Take 1 tablet (20 mg total) by mouth daily. 01/21/23   Agapito Games, MD  meloxicam (MOBIC) 15 MG tablet Take 1 tablet (15 mg total) by mouth daily as needed. 01/21/23   [name], MD  metFORMIN (GLUCOPHAGE) 1000 MG tablet Take 1  tablet (1,000 mg total) by mouth 2 (two) times daily with a meal. 01/24/23   Agapito Games, MD  Misc. Devices MISC Transfer of care to [facility].  Please provide service to her current CPAP machine for OSA at 14 cm. water pressure.  Send directly to [place] at [facility]. 05/15/19   [provider]  Omega-3 Fatty Acids (FISH OIL) 1200 MG CAPS Take 1 capsule by mouth daily.    [provider]  pantoprazole (PROTONIX) 40 MG tablet Take 1 tablet (40 mg total) by mouth 2 (two) times daily. 01/22/23   Agapito Games, MD  rosuvastatin (CRESTOR) 10 MG tablet TAKE 1 TABLET ONCE A WEEK AT BEDTIME 03/27/22   Agapito Games, MD  traMADol (ULTRAM) 50 MG tablet Take 1 tablet (50 mg total) by mouth every 8 (eight) hours as needed for moderate pain. 10/08/22   [name], MD     Family History  Problem Relation Age of Onset   Diabetes Mother    Hypertension Mother    Glaucoma Mother    Cancer Mother        bone cancer   Alcohol abuse Father    Emphysema Father    Other Father        CHF   Ovarian cancer Sister    Endometrial cancer Sister        Endometrial stromal sarcoma    Social History   Socioeconomic History   Marital status: Married    Spouse name: Glynda Jaeger   Number of children: 2   Years of education: Masters   Highest education level: Master's degree (e.g., MA, MS, MEng, MEd, MSW, MBA)  Occupational History   Occupation: Conservation officer, historic buildings: [employer]    Comment: retired semi  Tobacco Use   Smoking status: Former   Smokeless tobacco: Never  Advertising account planner   Vaping status: Never Used  Substance and Sexual Activity   Alcohol use: Yes    Comment: social   Drug use: No   Sexual activity: Never  Other Topics Concern   Not on file  Social History Narrative   Married to [place].  Retired and works 1 day a week for her daughter just to be out and about. Enjoys reading and doing crafts in her free time.   Social Determinants of Health    Financial Resource Strain: Low Risk  (09/17/2022)   Received from [facility]   Overall Financial Resource Strain (CARDIA)    Difficulty of Paying Living Expenses: Not hard at all  Food Insecurity: No Food Insecurity (12/31/2022)   Hunger Vital Sign    Worried About Running Out of Food in the Last Year: Never true    Ran Out of Food in the Last Year: Never true  Transportation Needs: No Transportation Needs (12/31/2022)   PRAPARE - Administrator, Civil Service (Medical): No  Lack of Transportation (Non-Medical): No  Physical Activity: Insufficiently Active (12/31/2022)   Exercise Vital Sign    Days of Exercise per Week: 2 days    Minutes of Exercise per Session: 20 min  Stress: No Stress Concern Present (12/31/2022)   [employer] of Occupational Health - Occupational Stress Questionnaire    Feeling of Stress : Not at all  Social Connections: Socially Isolated (12/31/2022)   Social Connection and Isolation Panel [NHANES]    Frequency of Communication with Friends and Family: More than three times a week    Frequency of Social Gatherings with Friends and Family: Once a week    Attends Religious Services: Never    Database administrator or Organizations: No    Attends Banker Meetings: Never    Marital Status: Widowed    Review of Systems: A 12 point ROS discussed and pertinent positives are indicated in the HPI above.  All other systems are negative.  Vital Signs: There were no vitals taken for this visit.  Advance Care Plan: The advanced care plan/surrogate decision maker was discussed at the time of visit and documented in the medical record.   No physical examination was performed in lieu of virtual telephone clinic visit.  Imaging: Bilateral knee radiographs (08/17/22)  Right, Kellgren and Lawrence Grade 3   Left, Kellgren and Lawrence Grade 3  Labs:  CBC: Recent Labs    07/03/22 1544  WBC 10.1  HGB 14.5  HCT 43.6  PLT  369    COAGS: No results for input(s): "INR", "APTT" in the last 8760 hours.  BMP: Recent Labs    03/16/22 0000 03/20/22 0000 07/03/22 1544 02/26/23 1550  NA 135 138 140 139  K 4.4 4.5 4.4 3.5  CL 98 100 98 96  CO2 26 27 32 27  GLUCOSE 248* 132* 235* 99  BUN 26* 31* 12 18  CALCIUM 9.1 9.5 9.4 9.0  CREATININE 1.02* 1.11* 0.72 0.77    LIVER FUNCTION TESTS: Recent Labs    03/20/22 0000 07/03/22 1544 02/26/23 1550  BILITOT 0.4 0.4 0.3  AST 30 17 16   ALT 28 14 13   ALKPHOS  --   --  91  PROT 7.3 7.0 6.5  ALBUMIN  --   --  4.2    TUMOR MARKERS: No results for input(s): "AFPTM", "CEA", "CA199", "CHROMGRNA" in the last 8760 hours.  Assessment and Plan: 72 year old female with history of severe bilateral knee pain (WOMAC 56/96, VAS 8/10) secondary to advanced (K&G 3) bilateral knee osteoarthritis.  She has had a series of hyaluronic acid injections with some improvement but her pain remains lifestyle limiting.  She has done a remarkable job losing 120 lbs and is hopeful to lose 70 more over the next year to qualify for knee arthroplasty.  She is hopeful that geniculate artery embolization would help alleviate pain and allow her to become more active to continue to lose weight.    We discussed risks, benefits, peri-procedural and long term expectations related to geniculate artery embolization.  She would like to proceed with treating the more symptomatic, right knee.   If she responds well, we can then pursue left knee treatment.  Plan for right geniculate artery embolization at [facility]  Electronically Signed: Bennie Dallas, MD 03/08/2023, 2:23 PM   I spent a total of  40 Minutes in virtual telephone clinical consultation, greater than 50% of which was counseling/coordinating care for geniculate artery embolization.

## 2023-03-11 ENCOUNTER — Ambulatory Visit: Payer: Medicare Other | Admitting: Rehabilitative and Restorative Service Providers"

## 2023-03-11 ENCOUNTER — Encounter: Payer: Self-pay | Admitting: Rehabilitative and Restorative Service Providers"

## 2023-03-11 DIAGNOSIS — M6281 Muscle weakness (generalized): Secondary | ICD-10-CM

## 2023-03-11 DIAGNOSIS — R2689 Other abnormalities of gait and mobility: Secondary | ICD-10-CM | POA: Diagnosis not present

## 2023-03-11 DIAGNOSIS — M25561 Pain in right knee: Secondary | ICD-10-CM | POA: Diagnosis not present

## 2023-03-11 DIAGNOSIS — G8929 Other chronic pain: Secondary | ICD-10-CM

## 2023-03-11 DIAGNOSIS — M25562 Pain in left knee: Secondary | ICD-10-CM | POA: Diagnosis not present

## 2023-03-11 NOTE — Therapy (Addendum)
OUTPATIENT PHYSICAL THERAPY LOWER EXTREMITY TREATMENT  Patient Name: Natalie Wright MRN: [id number] DOB:1951-01-15, 72 y.o., female Today's Date: 03/11/2023    03/11/23 1102  PT Visits / Re-Eval  Visit Number 14  Number of Visits 29  Date for PT Re-Evaluation 05/07/23  Authorization  Authorization Type medicare and tricare  Progress Note Due on Visit 10  PT Time Calculation  PT Start Time 1102  PT Stop Time 1145  PT Time Calculation (min) 43 min  PT - End of Session  Activity Tolerance Patient tolerated treatment well  Behavior During Therapy WFL for tasks assessed/performed     Past Medical History:  Diagnosis Date   Allergy    to cats and dogs   Diabetes mellitus    Hiatal hernia    and delayed gastric emptying/ sees [facility] for recurrent diarrhea   History of cardiovascular disorder 12/10/2008   Qualifier: Diagnosis of  By: Linford Arnold MD, Catherine     Hypertension    OSA (obstructive sleep apnea)    CPAP   Pedal edema    Spastic bladder    urology in [place]   TIA (transient ischemic attack)    hx of- sees Dr Rayburn Ma (Neurology)   Past Surgical History:  Procedure Laterality Date   CATARACT EXTRACTION  08/2021   IR RADIOLOGIST EVAL & MGMT  03/08/2023   vaginal skin tag removal     Patient Active Problem List   Diagnosis Date Noted   Diabetic retinopathy (HCC) 02/26/2023   Lumbar spondylosis 07/03/2022   Lumbar spinal stenosis 02/26/2022   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/2022   Bilateral hip pain 01/12/2022   Hearing loss due to cerumen impaction, right 01/12/2022   Hammertoe, bilateral 08/23/2021   History of foot ulcer 08/23/2021   Primary osteoarthritis of both knees 12/09/2020   History of burning pain in leg 08/29/2020   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   Tremor 05/05/2020   Pre-ulcerative corn or callous 01/29/2020   History of transient ischemic attack (TIA) 05/15/2018   Family history of ovarian  cancer 04/02/2017   Aortic atherosclerosis (HCC) 08/31/2016   Venous stasis 08/10/2016   Palpitations 07/16/2016   Atrial dilatation, left 07/16/2016   Atrial septal aneurysm 07/16/2016   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   Cataract 04/18/2012   Hyperlipidemia 02/13/2011   Pulmonary nodules 09/22/2010   LEG PAIN 09/12/2009   Sleep apnea 04/13/2009   GERD 03/14/2009   Morbid obesity (HCC) 12/10/2008   Essential hypertension, benign 12/10/2008   RAYNAUD'S DISEASE 12/10/2008   OSTEOARTHRITIS 12/10/2008   URINARY INCONTINENCE 12/10/2008    PCP: [name], MD REFERRING PROVIDER: [name], MD REFERRING DIAG:  M17.0 (ICD-10-CM) - Primary osteoarthritis of both knees   THERAPY DIAG:  Muscle weakness (generalized)  Chronic pain of right knee  Chronic pain of left knee  Other abnormalities of gait and mobility  Rationale for Evaluation and Treatment: Rehabilitation  ONSET DATE: 12/17/22  SUBJECTIVE:  SUBJECTIVE STATEMENT: The patient reports she saw MD and is planning to proceed with the LE procedure (genicular nerve ablation). She reports the side effects could be strange sensations in other nerves.She consistently has 5/10 pain.  PERTINENT HISTORY: Diabetes, HTN, TIA, spinal stenosis, polymyalgia rheumatica PAIN:  Are you having pain? Yes: NPRS scale: 5/10 most days/10 Pain location: back (hip to hip) and knees Pain description: "snap, crackle, pop" pain, instability at knees where they give out, and neuropathy in her feet Aggravating factors: trying to move after  standing, pivoting to step  Relieving factors: nothing  PRECAUTIONS: Fall  WEIGHT BEARING RESTRICTIONS: No  FALLS:  Has patient fallen in last 6 months? Yes. Number of falls 1  LIVING ENVIRONMENT: Lives with: lives alone spouse passed away 08-Jun-2022 Lives in: House/apartment Stairs: No Has following equipment at home: Quad cane large base and shower bench, grab bars, scooter (uses  husband's scooter)  PATIENT GOALS: "I'm not sure. I don't know how much therapy will help before knee surgery."  Be able to get in and out of her car easier-- right now, it is so slow and a long process.   OBJECTIVE:  (Measures in this section from initial evaluation unless otherwise noted) PATIENT SURVEYS:  FOTO n/a-- mobility focus SENSATION: Notes neuropathy with some burning in feet, dec'd sensation EDEMA:  Bilateral swelling noted in LEs LOWER EXTREMITY ROM: Active ROM Right eval Left eval  Hip flexion    Hip extension    Hip abduction    Hip adduction    Hip internal rotation    Hip external rotation    Knee flexion 110 110  Knee extension 0 0  Ankle dorsiflexion    Ankle plantarflexion    Ankle inversion    Ankle eversion     (Blank rows = not tested) LOWER EXTREMITY MMT: *can lift against gravity MMT Right eval Left eval Right 02/18/23 Left 02/18/23  Hip flexion 3/5 3/5 4+/5 4+/5  Hip extension      Hip abduction      Hip adduction      Hip internal rotation      Hip external rotation      Knee flexion 3/5 3/5 5/5 5/5  Knee extension 3/5 3/5 5/5 5/5  Ankle dorsiflexion 4/5 4/5    Ankle plantarflexion 3/5 3/5    Ankle inversion      Ankle eversion       (Blank rows = not tested) FUNCTIONAL TESTS:  5 time sit to stand=56.61  Gait speed=0.302 ft/sec *significantly slowed speed* GAIT: Distance walked: with [facility] slowed pace x 75 feet requiring increased time Assistive device utilized: Quad cane large base Level of assistance: Modified independence  slowed pace Comments: patient's quad cane is too high and she switches R and L sides-- PT to address Shoewear: Patient has to use slip on shoes and these slide during gait activities-- PT to address  Bed mobility: Independent climbing into high bed and rolling into bed  [facility] Adult PT Treatment:                                                DATE: 03/11/23 Therapeutic Exercise: Sit<>stand x 10  reps Standing Sidestepping x 4 reps sideways x 5 steps R and L Backwards alternating stepping 5 reps R and L sides Lateral+posterior steps to targets 5 reps R And L sides Alteranting UE reaching tasks for ADL replication in kitchen 1# overhead lift x 10 reps R and L for trunk and standing strength Sidelying Hip abduction x 10 reps R and L  Supine Bilat hamstring isometrics into physioball x 12 reps SLR x 10 reps R and L sides Gait: 120 ft with RW with cues to reduce leaning on RW (increases some glut and knee pain) 10 ft with SPC--pt places it midline anteriorly to use it like a walker 30 ft with [facility] and cues on  correct use of device   [facility] Adult PT Treatment:                                                DATE: 03/08/23 Therapeutic Exercise: Sit to stand x 10 reps mod indep Seated Knee isometrics small physioball extension and flexion x 10 reps x 3 second holds Standing Lateral weight shift with bilat UE support Alternating foot tapping with bilat UE support Heel raises x 10 reps with bilat UE support Supine SLR x 10 reps R and L sides Marching x 10 reps alternating R and L sides Gait: Ambulation x 120 feet x 2 reps   [facility] Adult PT Treatment:                                                DATE: 02/25/2023 Therapeutic Exercise: Straight leg raises M/L over tall block x10 (B) LAQ 3# x 10 reps (B) Slow core marching + abdominal bracing 3#AW 10x3"  Standing HS curl 3# x 10 ea Therapeutic Activity: Standing lateral foot taps to 8 inch step x 10 R only Standing fwd foot taps to 8 inch step B x 10   [facility] Adult PT Treatment:                                                DATE: 02/21/2023 Therapeutic Exercise: Seated:  Straight leg raises M/L over tall block x10 (B) Isometric LAQ into physioball x 10 reps (B) Isometric HS curl into physioball x 10 reps (B) LAQ + hip add iso (ball squeeze) x10 (B) Slow core marching + abdominal bracing 10x3"  Prone quad with strap  3x30" Therapeutic Activity: Standing alt foot taps on 4" step Walking + stepping over blocks (mid & low) Discussion on how to modify thomas stretch at home (couch vs bed)    PATIENT EDUCATION:  Education details: Updated HEP Person educated: Patient Education method: Explanation, Demonstration, and Handouts Education comprehension: verbalized understanding, returned demonstration, and needs further education  HOME EXERCISE PROGRAM: Access Code: [email or web address] URL: [email or web address] Date: 03/11/2023 Prepared by: Margretta Ditty  Exercises - Supine Active Straight Leg Raise  - 1 x daily - 7 x weekly - 1 sets - 10 reps - Supine Quadriceps Stretch with Strap on Table  - 2 x daily - 7 x weekly - 1 sets - 3 reps - 30-60 sec hold - Seated Transversus Abdominis Bracing  - 1 x daily - 7 x weekly - 1 sets - 10 reps - 5 sec hold - Seated Long Arc Quad  - 1 x daily - 7 x weekly - 1 sets - 10 reps - Sit to Stand with Armchair  - 1 x daily - 7 x weekly - 1 sets - 5 reps - Lateral Step Up with Counter Support  - 1 x daily - 7 x weekly - 3 sets - 10 reps - Forward Step Up with Counter Support  - 1 x daily - 7 x weekly - 3 sets - 10 reps - Side Stepping with Counter Support  - 1 x daily - 7  x weekly - 3 sets - 10 reps - Forward and Backward Step Over with Counter Support  - 1 x daily - 7 x weekly - 1 sets - 10 reps  ASSESSMENT:  CLINICAL IMPRESSION: The patient tolerated increased standing activities today-- she gets increased soreness after sidestepping. PT continuing to work on standing tolerance and LE strength. We also focused on safe use of assistive devices, RW is recommended due to safety. PT plans to continue to progress working on improving mobility, dec'ing fall risk, reducing pain, and improving strength.   OBJECTIVE IMPAIRMENTS: Abnormal gait, decreased activity tolerance, decreased balance, decreased mobility, decreased strength, impaired flexibility, obesity, and pain.    GOALS: Goals reviewed with patient? Yes  UPDATED GOALS: SHORT TERM GOALS: Target date: 04/07/23  1.  The patient will report dec'd pain in R leg while driving.  Baseline: Notes pain with being in the car has increased.  Goal status: REVISED  LONG TERM GOALS: Target date: 05/07/23  The patient will be indep with progression of HEP. Baseline: initiated at eval. Goal status: REVISED   2.   The patient will improve gait speed to > or equal to 1.3  ft/sec to demo transition to community ambulation status. Baseline:  0.302 ft/sec at eval and 1.11 ft/sec re-eval Goal status:  REVISED  4.  The patient will perform 5 time sit<>stand in < or equal to 20 seconds. Baseline: 56.61 seconds AT EVAL, 22.5 sec re-eval Goal status: REVISED  5.  The patient will report ability to stand at the kitchen counter x 10 minutes without sensation of not being able to lift her legs after standing. Baseline:  After standing in one spot, patient reports unable to lift her leg to walk Goal status: REVISED    PLAN:  PT FREQUENCY: 2x/week  PT DURATION: 8 weeks  PLANNED INTERVENTIONS: Therapeutic exercises, Therapeutic activity, Neuromuscular re-education, Balance training, Gait training, Patient/Family education, Self Care, and Joint mobilization  PLAN FOR NEXT SESSION:  Continue LE/core strengthening. Continue standing toe taps, STS; progress standing exercises & gait training as tolerated.    Aaron Boeh, PT 03/11/2023

## 2023-03-12 ENCOUNTER — Other Ambulatory Visit (HOSPITAL_COMMUNITY): Payer: Self-pay | Admitting: Interventional Radiology

## 2023-03-12 DIAGNOSIS — H2512 Age-related nuclear cataract, left eye: Secondary | ICD-10-CM | POA: Diagnosis not present

## 2023-03-12 DIAGNOSIS — E113213 Type 2 diabetes mellitus with mild nonproliferative diabetic retinopathy with macular edema, bilateral: Secondary | ICD-10-CM | POA: Diagnosis not present

## 2023-03-12 DIAGNOSIS — M1711 Unilateral primary osteoarthritis, right knee: Secondary | ICD-10-CM

## 2023-03-12 DIAGNOSIS — H43811 Vitreous degeneration, right eye: Secondary | ICD-10-CM | POA: Diagnosis not present

## 2023-03-12 DIAGNOSIS — H20021 Recurrent acute iridocyclitis, right eye: Secondary | ICD-10-CM | POA: Diagnosis not present

## 2023-03-13 DIAGNOSIS — Z23 Encounter for immunization: Secondary | ICD-10-CM | POA: Diagnosis not present

## 2023-03-14 DIAGNOSIS — K8681 Exocrine pancreatic insufficiency: Secondary | ICD-10-CM | POA: Diagnosis not present

## 2023-03-14 DIAGNOSIS — K219 Gastro-esophageal reflux disease without esophagitis: Secondary | ICD-10-CM | POA: Diagnosis not present

## 2023-03-15 ENCOUNTER — Ambulatory Visit: Payer: Medicare Other

## 2023-03-15 DIAGNOSIS — G8929 Other chronic pain: Secondary | ICD-10-CM

## 2023-03-15 DIAGNOSIS — M6281 Muscle weakness (generalized): Secondary | ICD-10-CM | POA: Diagnosis not present

## 2023-03-15 DIAGNOSIS — R2689 Other abnormalities of gait and mobility: Secondary | ICD-10-CM | POA: Diagnosis not present

## 2023-03-15 DIAGNOSIS — M25561 Pain in right knee: Secondary | ICD-10-CM | POA: Diagnosis not present

## 2023-03-15 DIAGNOSIS — M25562 Pain in left knee: Secondary | ICD-10-CM | POA: Diagnosis not present

## 2023-03-15 NOTE — Therapy (Signed)
OUTPATIENT PHYSICAL THERAPY LOWER EXTREMITY TREATMENT  Patient Name: Natalie Wright MRN: [id number] DOB:03-Jan-1951, 72 y.o., female Today's Date: 03/15/2023  PT End of Session - 03/15/23 1105     Visit Number 15    Number of Visits 29    Date for PT Re-Evaluation 05/07/23    Authorization Type medicare and tricare    PT Start Time 1105    PT Stop Time 1143    PT Time Calculation (min) 38 min    Activity Tolerance Patient tolerated treatment well    Behavior During Therapy WFL for tasks assessed/performed            Past Medical History:  Diagnosis Date   Allergy    to cats and dogs   Diabetes mellitus    Hiatal hernia    and delayed gastric emptying/ sees [facility] for recurrent diarrhea   History of cardiovascular disorder 12/10/2008   Qualifier: Diagnosis of  By: Linford Arnold MD, Catherine     Hypertension    OSA (obstructive sleep apnea)    CPAP   Pedal edema    Spastic bladder    urology in [place]   TIA (transient ischemic attack)    hx of- sees Dr Rayburn Ma (Neurology)   Past Surgical History:  Procedure Laterality Date   CATARACT EXTRACTION  08/2021   IR RADIOLOGIST EVAL & MGMT  03/08/2023   vaginal skin tag removal     Patient Active Problem List   Diagnosis Date Noted   Diabetic retinopathy (HCC) 02/26/2023   Lumbar spondylosis 07/03/2022   Lumbar spinal stenosis 02/26/2022   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/2022   Bilateral hip pain 01/12/2022   Hearing loss due to cerumen impaction, right 01/12/2022   Hammertoe, bilateral 08/23/2021   History of foot ulcer 08/23/2021   Primary osteoarthritis of both knees 12/09/2020   History of burning pain in leg 08/29/2020   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   Tremor 05/05/2020   Pre-ulcerative corn or callous 01/29/2020   History of transient ischemic attack (TIA) 05/15/2018   Family history of ovarian cancer 04/02/2017   Aortic atherosclerosis (HCC) 08/31/2016    Venous stasis 08/10/2016   Palpitations 07/16/2016   Atrial dilatation, left 07/16/2016   Atrial septal aneurysm 07/16/2016   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   Cataract 04/18/2012   Hyperlipidemia 02/13/2011   Pulmonary nodules 09/22/2010   LEG PAIN 09/12/2009   Sleep apnea 04/13/2009   GERD 03/14/2009   Morbid obesity (HCC) 12/10/2008   Essential hypertension, benign 12/10/2008   RAYNAUD'S DISEASE 12/10/2008   OSTEOARTHRITIS 12/10/2008   URINARY INCONTINENCE 12/10/2008    PCP: [name], MD REFERRING PROVIDER: [name], MD REFERRING DIAG:  M17.0 (ICD-10-CM) - Primary osteoarthritis of both knees   THERAPY DIAG:  Muscle weakness (generalized)  Chronic pain of right knee  Chronic pain of left knee  Other abnormalities of gait and mobility  Rationale for Evaluation and Treatment: Rehabilitation  ONSET DATE: 12/17/22  SUBJECTIVE:  SUBJECTIVE STATEMENT: Patient reports she is working on getting scheduled for ablation procedure; states her spinal injection is wearing off and she is having a deep soreness in low back, 8/10 soreness.  PERTINENT HISTORY: Diabetes, HTN, TIA, spinal stenosis, polymyalgia rheumatica PAIN:  Are you having pain? Yes: NPRS scale: 5/10 most days/10 Pain location: back (hip to hip) and knees Pain description: "snap, crackle, pop" pain, instability at knees where they give out, and neuropathy in her feet Aggravating factors: trying to move  after standing, pivoting to step  Relieving factors: nothing  PRECAUTIONS: Fall  WEIGHT BEARING RESTRICTIONS: No  FALLS:  Has patient fallen in last 6 months? Yes. Number of falls 1  LIVING ENVIRONMENT: Lives with: lives alone spouse passed away May 20, 2022 Lives in: House/apartment Stairs: No Has following equipment at home: Quad cane large base and shower bench, grab bars, scooter (uses husband's scooter)  PATIENT GOALS: "I'm not sure. I don't know how much therapy will help before  knee surgery."  Be able to get in and out of her car easier-- right now, it is so slow and a long process.   OBJECTIVE:  (Measures in this section from initial evaluation unless otherwise noted) PATIENT SURVEYS:  FOTO n/a-- mobility focus SENSATION: Notes neuropathy with some burning in feet, dec'd sensation EDEMA:  Bilateral swelling noted in LEs LOWER EXTREMITY ROM: Active ROM Right eval Left eval  Hip flexion    Hip extension    Hip abduction    Hip adduction    Hip internal rotation    Hip external rotation    Knee flexion 110 110  Knee extension 0 0  Ankle dorsiflexion    Ankle plantarflexion    Ankle inversion    Ankle eversion     (Blank rows = not tested) LOWER EXTREMITY MMT: *can lift against gravity MMT Right eval Left eval Right 02/18/23 Left 02/18/23  Hip flexion 3/5 3/5 4+/5 4+/5  Hip extension      Hip abduction      Hip adduction      Hip internal rotation      Hip external rotation      Knee flexion 3/5 3/5 5/5 5/5  Knee extension 3/5 3/5 5/5 5/5  Ankle dorsiflexion 4/5 4/5    Ankle plantarflexion 3/5 3/5    Ankle inversion      Ankle eversion       (Blank rows = not tested) FUNCTIONAL TESTS:  5 time sit to stand=56.61  Gait speed=0.302 ft/sec *significantly slowed speed* GAIT: Distance walked: with [facility] slowed pace x 75 feet requiring increased time Assistive device utilized: Quad cane large base Level of assistance: Modified independence  slowed pace Comments: patient's quad cane is too high and she switches R and L sides-- PT to address Shoewear: Patient has to use slip on shoes and these slide during gait activities-- PT to address  Bed mobility: Independent climbing into high bed and rolling into bed   [facility] Adult PT Treatment:                                                DATE: 03/15/2023 Therapeutic Exercise: Counter: Side stepping Bkwd walking Lateral+posterior steps around targets Stepping out and around colored dot 2x5 (B) 1#  overhead lift x 10 reps R and L for trunk and standing strength Standing balance with unilateral 5#KB swing Supine: Bilateral HS isometric press into orange PB x15 SLR x12 (B) S/L straight leg hip abd x10 Therapeutic Activity: High knee step through walking with FWW x80'     [facility] Adult PT Treatment:                                                DATE: 03/11/23  Therapeutic Exercise: Sit<>stand x 10 reps Standing Sidestepping x 4 reps sideways x 5 steps R and L Backwards alternating stepping 5 reps R and L sides Lateral+posterior steps to targets 5 reps R And L sides Alteranting UE reaching tasks for ADL replication in kitchen 1# overhead lift x 10 reps R and L for trunk and standing strength Sidelying Hip abduction x 10 reps R and L  Supine Bilat hamstring isometrics into physioball x 12 reps SLR x 10 reps R and L sides Gait: 120 ft with RW with cues to reduce leaning on RW (increases some glut and knee pain) 10 ft with SPC--pt places it midline anteriorly to use it like a walker 30 ft with [facility] and cues on correct use of device    [facility] Adult PT Treatment:                                                DATE: 03/08/23 Therapeutic Exercise: Sit to stand x 10 reps mod indep Seated Knee isometrics small physioball extension and flexion x 10 reps x 3 second holds Standing Lateral weight shift with bilat UE support Alternating foot tapping with bilat UE support Heel raises x 10 reps with bilat UE support Supine SLR x 10 reps R and L sides Marching x 10 reps alternating R and L sides Gait: Ambulation x 120 feet x 2 reps   PATIENT EDUCATION:  Education details: Updated HEP Person educated: Patient Education method: Explanation, Demonstration, and Handouts Education comprehension: verbalized understanding, returned demonstration, and needs further education  HOME EXERCISE PROGRAM: Access Code: [email or web address] URL: [email or web address] Date: 03/11/2023 Prepared by:  Margretta Ditty  Exercises - Supine Active Straight Leg Raise  - 1 x daily - 7 x weekly - 1 sets - 10 reps - Supine Quadriceps Stretch with Strap on Table  - 2 x daily - 7 x weekly - 1 sets - 3 reps - 30-60 sec hold - Seated Transversus Abdominis Bracing  - 1 x daily - 7 x weekly - 1 sets - 10 reps - 5 sec hold - Seated Long Arc Quad  - 1 x daily - 7 x weekly - 1 sets - 10 reps - Sit to Stand with Armchair  - 1 x daily - 7 x weekly - 1 sets - 5 reps - Lateral Step Up with Counter Support  - 1 x daily - 7 x weekly - 3 sets - 10 reps - Forward Step Up with Counter Support  - 1 x daily - 7 x weekly - 3 sets - 10 reps - Side Stepping with Counter Support  - 1 x daily - 7 x weekly - 3 sets - 10 reps - Forward and Backward Step Over with Counter Support  - 1 x daily - 7 x weekly - 1 sets - 10 reps  ASSESSMENT:  CLINICAL IMPRESSION: Seated rest breaks taken as needed during standing exercises due to increased soreness in low back and bilateral knees. Gait training continued with focus on awareness of functional knee and hip flexion during swing phase for safe foot clearance; discussion with patient on trying to pick up feet versus pivoting on them during turns to decrease twisting at the knee.    OBJECTIVE IMPAIRMENTS: Abnormal gait, decreased activity tolerance, decreased balance, decreased mobility, decreased strength, impaired flexibility, obesity, and pain.   GOALS: Goals reviewed  with patient? Yes  UPDATED GOALS: SHORT TERM GOALS: Target date: 04/07/23  1.  The patient will report dec'd pain in R leg while driving.  Baseline: Notes pain with being in the car has increased.  Goal status: REVISED  LONG TERM GOALS: Target date: 05/07/23  The patient will be indep with progression of HEP. Baseline: initiated at eval. Goal status: REVISED   2.   The patient will improve gait speed to > or equal to 1.3  ft/sec to demo transition to community ambulation status. Baseline:  0.302 ft/sec at  eval and 1.11 ft/sec re-eval Goal status:  REVISED  4.  The patient will perform 5 time sit<>stand in < or equal to 20 seconds. Baseline: 56.61 seconds AT EVAL, 22.5 sec re-eval Goal status: REVISED  5.  The patient will report ability to stand at the kitchen counter x 10 minutes without sensation of not being able to lift her legs after standing. Baseline:  After standing in one spot, patient reports unable to lift her leg to walk Goal status: REVISED    PLAN:  PT FREQUENCY: 2x/week  PT DURATION: 8 weeks  PLANNED INTERVENTIONS: Therapeutic exercises, Therapeutic activity, Neuromuscular re-education, Balance training, Gait training, Patient/Family education, Self Care, and Joint mobilization  PLAN FOR NEXT SESSION:  Continue LE/core strengthening, progressing standing endurance & gait training as tolerated.    Sanjuana Mae, PTA 03/15/2023

## 2023-03-18 ENCOUNTER — Ambulatory Visit: Payer: Medicare Other | Admitting: Rehabilitative and Restorative Service Providers"

## 2023-03-18 ENCOUNTER — Encounter: Payer: Self-pay | Admitting: Rehabilitative and Restorative Service Providers"

## 2023-03-18 ENCOUNTER — Ambulatory Visit: Payer: Medicare Other

## 2023-03-18 DIAGNOSIS — R2689 Other abnormalities of gait and mobility: Secondary | ICD-10-CM

## 2023-03-18 DIAGNOSIS — M6281 Muscle weakness (generalized): Secondary | ICD-10-CM

## 2023-03-18 DIAGNOSIS — G8929 Other chronic pain: Secondary | ICD-10-CM | POA: Diagnosis not present

## 2023-03-18 DIAGNOSIS — M25562 Pain in left knee: Secondary | ICD-10-CM | POA: Diagnosis not present

## 2023-03-18 DIAGNOSIS — M25561 Pain in right knee: Secondary | ICD-10-CM | POA: Diagnosis not present

## 2023-03-18 NOTE — Therapy (Signed)
OUTPATIENT PHYSICAL THERAPY LOWER EXTREMITY TREATMENT  Patient Name: Natalie Wright MRN: [id number] DOB:06-22-1951, 72 y.o., female Today's Date: 03/18/2023  PT End of Session - 03/18/23 1108     Visit Number 16    Number of Visits 29    Date for PT Re-Evaluation 05/07/23    Authorization Type medicare and tricare    Progress Note Due on Visit 20    PT Start Time 1105    PT Stop Time 1145    PT Time Calculation (min) 40 min    Activity Tolerance Patient tolerated treatment well    Behavior During Therapy WFL for tasks assessed/performed             Past Medical History:  Diagnosis Date   Allergy    to cats and dogs   Diabetes mellitus    Hiatal hernia    and delayed gastric emptying/ sees [facility] for recurrent diarrhea   History of cardiovascular disorder 12/10/2008   Qualifier: Diagnosis of  By: Linford Arnold MD, Catherine     Hypertension    OSA (obstructive sleep apnea)    CPAP   Pedal edema    Spastic bladder    urology in [place]   TIA (transient ischemic attack)    hx of- sees Dr Rayburn Ma (Neurology)   Past Surgical History:  Procedure Laterality Date   CATARACT EXTRACTION  08/2021   IR RADIOLOGIST EVAL & MGMT  03/08/2023   vaginal skin tag removal     Patient Active Problem List   Diagnosis Date Noted   Diabetic retinopathy (HCC) 02/26/2023   Lumbar spondylosis 07/03/2022   Lumbar spinal stenosis 02/26/2022   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/2022   Bilateral hip pain 01/12/2022   Hearing loss due to cerumen impaction, right 01/12/2022   Hammertoe, bilateral 08/23/2021   History of foot ulcer 08/23/2021   Primary osteoarthritis of both knees 12/09/2020   History of burning pain in leg 08/29/2020   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   Tremor 05/05/2020   Pre-ulcerative corn or callous 01/29/2020   History of transient ischemic attack (TIA) 05/15/2018   Family history of ovarian cancer 04/02/2017   Aortic  atherosclerosis (HCC) 08/31/2016   Venous stasis 08/10/2016   Palpitations 07/16/2016   Atrial dilatation, left 07/16/2016   Atrial septal aneurysm 07/16/2016   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   Cataract 04/18/2012   Hyperlipidemia 02/13/2011   Pulmonary nodules 09/22/2010   LEG PAIN 09/12/2009   Sleep apnea 04/13/2009   GERD 03/14/2009   Morbid obesity (HCC) 12/10/2008   Essential hypertension, benign 12/10/2008   RAYNAUD'S DISEASE 12/10/2008   OSTEOARTHRITIS 12/10/2008   URINARY INCONTINENCE 12/10/2008    PCP: [name], MD REFERRING PROVIDER: [name], MD REFERRING DIAG:  M17.0 (ICD-10-CM) - Primary osteoarthritis of both knees   THERAPY DIAG:  Muscle weakness (generalized)  Chronic pain of right knee  Chronic pain of left knee  Other abnormalities of gait and mobility  Rationale for Evaluation and Treatment: Rehabilitation  ONSET DATE: 12/17/22  SUBJECTIVE:  SUBJECTIVE STATEMENT: The patient notes her right calf muscle is sore. She feels it when walking and not at rest. Her knees are about the same.  PERTINENT HISTORY: Diabetes, HTN, TIA, spinal stenosis, polymyalgia rheumatica PAIN:  Are you having pain? Yes: NPRS scale: 5/10 most days/10 Pain location: back (hip to hip) and knees Pain description: "snap, crackle, pop" pain, instability at knees where they give out, and neuropathy in her feet Aggravating  factors: trying to move after standing, pivoting to step  Relieving factors: nothing  PRECAUTIONS: Fall  WEIGHT BEARING RESTRICTIONS: No  FALLS:  Has patient fallen in last 6 months? Yes. Number of falls 1  LIVING ENVIRONMENT: Lives with: lives alone spouse passed away 2022-06-11 Lives in: House/apartment Stairs: No Has following equipment at home: Quad cane large base and shower bench, grab bars, scooter (uses husband's scooter)  PATIENT GOALS: "I'm not sure. I don't know how much therapy will help before knee surgery."  Be able  to get in and out of her car easier-- right now, it is so slow and a long process.   OBJECTIVE:  (Measures in this section from initial evaluation unless otherwise noted) PATIENT SURVEYS:  FOTO n/a-- mobility focus SENSATION: Notes neuropathy with some burning in feet, dec'd sensation EDEMA:  Bilateral swelling noted in LEs LOWER EXTREMITY ROM: Active ROM Right eval Left eval  Hip flexion    Hip extension    Hip abduction    Hip adduction    Hip internal rotation    Hip external rotation    Knee flexion 110 110  Knee extension 0 0  Ankle dorsiflexion    Ankle plantarflexion    Ankle inversion    Ankle eversion     (Blank rows = not tested) LOWER EXTREMITY MMT: *can lift against gravity MMT Right eval Left eval Right 02/18/23 Left 02/18/23  Hip flexion 3/5 3/5 4+/5 4+/5  Hip extension      Hip abduction      Hip adduction      Hip internal rotation      Hip external rotation      Knee flexion 3/5 3/5 5/5 5/5  Knee extension 3/5 3/5 5/5 5/5  Ankle dorsiflexion 4/5 4/5    Ankle plantarflexion 3/5 3/5    Ankle inversion      Ankle eversion       (Blank rows = not tested) FUNCTIONAL TESTS:  5 time sit to stand=56.61  Gait speed=0.302 ft/sec *significantly slowed speed* GAIT: Distance walked: with [facility] slowed pace x 75 feet requiring increased time Assistive device utilized: Quad cane large base Level of assistance: Modified independence  slowed pace Comments: patient's quad cane is too high and she switches R and L sides-- PT to address Shoewear: Patient has to use slip on shoes and these slide during gait activities-- PT to address  Bed mobility: Independent climbing into high bed and rolling into bed  [facility] Adult PT Treatment:                                                DATE: 03/18/23 Therapeutic Exercise: Standing Heel/toe raises x 10 reps Mini knee bends x 10 reps Knee flexion alternating R and L x 10 reps Sidelying Hip abduction x 10 reps x 2  sets Supine SLR x 10 reps Marching x 10 reps Seated LAQ with 2# x 10 reps x 2 sets Red band hamstring curl x 10 reps R and L sides Sit<>stand x 10 reps Gait: 60 ft with RW mod indep 180 ft with RW mod indep  [facility] Adult PT Treatment:  DATE: 03/15/2023 Therapeutic Exercise: Counter: Side stepping Bkwd walking Lateral+posterior steps around targets Stepping out and around colored dot 2x5 (B) 1# overhead lift x 10 reps R and L for trunk and standing strength Standing balance with unilateral 5#KB swing Supine: Bilateral HS isometric press into orange PB x15 SLR x12 (B) S/L straight leg hip abd x10 Therapeutic Activity: High knee step through walking with FWW x80'   [facility] Adult PT Treatment:                                                DATE: 03/11/23 Therapeutic Exercise: Sit<>stand x 10 reps Standing Sidestepping x 4 reps sideways x 5 steps R and L Backwards alternating stepping 5 reps R and L sides Lateral+posterior steps to targets 5 reps R And L sides Alteranting UE reaching tasks for ADL replication in kitchen 1# overhead lift x 10 reps R and L for trunk and standing strength Sidelying Hip abduction x 10 reps R and L  Supine Bilat hamstring isometrics into physioball x 12 reps SLR x 10 reps R and L sides Gait: 120 ft with RW with cues to reduce leaning on RW (increases some glut and knee pain) 10 ft with SPC--pt places it midline anteriorly to use it like a walker 30 ft with [facility] and cues on correct use of device    [facility] Adult PT Treatment:                                                DATE: 03/08/23 Therapeutic Exercise: Sit to stand x 10 reps mod indep Seated Knee isometrics small physioball extension and flexion x 10 reps x 3 second holds Standing Lateral weight shift with bilat UE support Alternating foot tapping with bilat UE support Heel raises x 10 reps with bilat UE support Supine SLR x 10 reps R and L  sides Marching x 10 reps alternating R and L sides Gait: Ambulation x 120 feet x 2 reps   PATIENT EDUCATION:  Education details: Updated HEP Person educated: Patient Education method: Explanation, Demonstration, and Handouts Education comprehension: verbalized understanding, returned demonstration, and needs further education  HOME EXERCISE PROGRAM: Access Code: [email or web address] URL: [email or web address] Date: 03/11/2023 Prepared by: Margretta Ditty  Exercises - Supine Active Straight Leg Raise  - 1 x daily - 7 x weekly - 1 sets - 10 reps - Supine Quadriceps Stretch with Strap on Table  - 2 x daily - 7 x weekly - 1 sets - 3 reps - 30-60 sec hold - Seated Transversus Abdominis Bracing  - 1 x daily - 7 x weekly - 1 sets - 10 reps - 5 sec hold - Seated Long Arc Quad  - 1 x daily - 7 x weekly - 1 sets - 10 reps - Sit to Stand with Armchair  - 1 x daily - 7 x weekly - 1 sets - 5 reps - Lateral Step Up with Counter Support  - 1 x daily - 7 x weekly - 3 sets - 10 reps - Forward Step Up with Counter Support  - 1 x daily - 7 x weekly - 3 sets - 10 reps - Side Stepping with Counter Support  - 1 x daily -  7 x weekly - 3 sets - 10 reps - Forward and Backward Step Over with Counter Support  - 1 x daily - 7 x weekly - 1 sets - 10 reps  ASSESSMENT:  CLINICAL IMPRESSION: The patient is tolerating increased activity within a session with standing tolerance improving. She notes same level of knee pain and back pain varies, depending on activities. PT to continue progressing exercise and gait. Plan to continue working to [employer] and LTGs.    OBJECTIVE IMPAIRMENTS: Abnormal gait, decreased activity tolerance, decreased balance, decreased mobility, decreased strength, impaired flexibility, obesity, and pain.   GOALS: Goals reviewed with patient? Yes  UPDATED GOALS: SHORT TERM GOALS: Target date: 04/07/23  1.  The patient will report dec'd pain in R leg while driving.  Baseline: Notes pain  with being in the car has increased.  Goal status: REVISED *reports on 03/18/23 that she modified set up using a seat cushion and lumbar support in the car.  LONG TERM GOALS: Target date: 05/07/23  The patient will be indep with progression of HEP. Baseline: initiated at eval. Goal status: REVISED   2.   The patient will improve gait speed to > or equal to 1.3  ft/sec to demo transition to community ambulation status. Baseline:  0.302 ft/sec at eval and 1.11 ft/sec re-eval Goal status:  REVISED  4.  The patient will perform 5 time sit<>stand in < or equal to 20 seconds. Baseline: 56.61 seconds AT EVAL, 22.5 sec re-eval Goal status: REVISED  5.  The patient will report ability to stand at the kitchen counter x 10 minutes without sensation of not being able to lift her legs after standing. Baseline:  After standing in one spot, patient reports unable to lift her leg to walk Goal status: REVISED    PLAN:  PT FREQUENCY: 2x/week  PT DURATION: 8 weeks  PLANNED INTERVENTIONS: Therapeutic exercises, Therapeutic activity, Neuromuscular re-education, Balance training, Gait training, Patient/Family education, Self Care, and Joint mobilization  PLAN FOR NEXT SESSION:  Continue LE/core strengthening, progressing standing endurance & gait training as tolerated.    Bernadetta Roell, PT 03/18/2023

## 2023-03-19 ENCOUNTER — Other Ambulatory Visit: Payer: Self-pay | Admitting: Family Medicine

## 2023-03-21 ENCOUNTER — Ambulatory Visit: Payer: Medicare Other

## 2023-03-25 ENCOUNTER — Ambulatory Visit: Payer: Medicare Other

## 2023-03-25 DIAGNOSIS — R2689 Other abnormalities of gait and mobility: Secondary | ICD-10-CM

## 2023-03-25 DIAGNOSIS — M25562 Pain in left knee: Secondary | ICD-10-CM | POA: Diagnosis not present

## 2023-03-25 DIAGNOSIS — M6281 Muscle weakness (generalized): Secondary | ICD-10-CM

## 2023-03-25 DIAGNOSIS — G8929 Other chronic pain: Secondary | ICD-10-CM

## 2023-03-25 DIAGNOSIS — M25561 Pain in right knee: Secondary | ICD-10-CM | POA: Diagnosis not present

## 2023-03-25 NOTE — Therapy (Signed)
OUTPATIENT PHYSICAL THERAPY LOWER EXTREMITY TREATMENT  Patient Name: Natalie Wright MRN: [id number] DOB:27-Mar-1951, 72 y.o., female Today's Date: 03/25/2023  PT End of Session - 03/25/23 1134     Visit Number 17    Date for PT Re-Evaluation 05/07/23    Authorization Type medicare and tricare    Progress Note Due on Visit 20    PT Start Time 1105    PT Stop Time 1143    PT Time Calculation (min) 38 min    Activity Tolerance Patient tolerated treatment well    Behavior During Therapy WFL for tasks assessed/performed              Past Medical History:  Diagnosis Date   Allergy    to cats and dogs   Diabetes mellitus    Hiatal hernia    and delayed gastric emptying/ sees [facility] for recurrent diarrhea   History of cardiovascular disorder 12/10/2008   Qualifier: Diagnosis of  By: Linford Arnold MD, Catherine     Hypertension    OSA (obstructive sleep apnea)    CPAP   Pedal edema    Spastic bladder    urology in [place]   TIA (transient ischemic attack)    hx of- sees Dr Rayburn Ma (Neurology)   Past Surgical History:  Procedure Laterality Date   CATARACT EXTRACTION  08/2021   IR RADIOLOGIST EVAL & MGMT  03/08/2023   vaginal skin tag removal     Patient Active Problem List   Diagnosis Date Noted   Diabetic retinopathy (HCC) 02/26/2023   Lumbar spondylosis 07/03/2022   Lumbar spinal stenosis 02/26/2022   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/2022   Bilateral hip pain 01/12/2022   Hearing loss due to cerumen impaction, right 01/12/2022   Hammertoe, bilateral 08/23/2021   History of foot ulcer 08/23/2021   Primary osteoarthritis of both knees 12/09/2020   History of burning pain in leg 08/29/2020   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   Tremor 05/05/2020   Pre-ulcerative corn or callous 01/29/2020   History of transient ischemic attack (TIA) 05/15/2018   Family history of ovarian cancer 04/02/2017   Aortic atherosclerosis (HCC)  08/31/2016   Venous stasis 08/10/2016   Palpitations 07/16/2016   Atrial dilatation, left 07/16/2016   Atrial septal aneurysm 07/16/2016   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   Cataract 04/18/2012   Hyperlipidemia 02/13/2011   Pulmonary nodules 09/22/2010   LEG PAIN 09/12/2009   Sleep apnea 04/13/2009   GERD 03/14/2009   Morbid obesity (HCC) 12/10/2008   Essential hypertension, benign 12/10/2008   RAYNAUD'S DISEASE 12/10/2008   OSTEOARTHRITIS 12/10/2008   URINARY INCONTINENCE 12/10/2008    PCP: [name], MD REFERRING PROVIDER: [name], MD REFERRING DIAG:  M17.0 (ICD-10-CM) - Primary osteoarthritis of both knees   THERAPY DIAG:  Muscle weakness (generalized)  Chronic pain of right knee  Chronic pain of left knee  Other abnormalities of gait and mobility  Rationale for Evaluation and Treatment: Rehabilitation  ONSET DATE: 12/17/22  SUBJECTIVE:  SUBJECTIVE STATEMENT: Patient reports she was able to tolerate standing for 30 min while cooking; states she has noticed less popping in knees. Patient states her legs are tired today.  PERTINENT HISTORY: Diabetes, HTN, TIA, spinal stenosis, polymyalgia rheumatica PAIN:  Are you having pain? Yes: NPRS scale: 5/10 most days/10 Pain location: back (hip to hip) and knees Pain description: "snap, crackle, pop" pain, instability at knees where they give out, and neuropathy in her feet Aggravating factors: trying  to move after standing, pivoting to step  Relieving factors: nothing  PRECAUTIONS: Fall  WEIGHT BEARING RESTRICTIONS: No  FALLS:  Has patient fallen in last 6 months? Yes. Number of falls 1  LIVING ENVIRONMENT: Lives with: lives alone spouse passed away 2022/05/28 Lives in: House/apartment Stairs: No Has following equipment at home: Quad cane large base and shower bench, grab bars, scooter (uses husband's scooter)  PATIENT GOALS: "I'm not sure. I don't know how much therapy will help before knee  surgery."  Be able to get in and out of her car easier-- right now, it is so slow and a long process.   OBJECTIVE:  (Measures in this section from initial evaluation unless otherwise noted) PATIENT SURVEYS:  FOTO n/a-- mobility focus SENSATION: Notes neuropathy with some burning in feet, dec'd sensation EDEMA:  Bilateral swelling noted in LEs LOWER EXTREMITY ROM: Active ROM Right eval Left eval  Hip flexion    Hip extension    Hip abduction    Hip adduction    Hip internal rotation    Hip external rotation    Knee flexion 110 110  Knee extension 0 0  Ankle dorsiflexion    Ankle plantarflexion    Ankle inversion    Ankle eversion     (Blank rows = not tested) LOWER EXTREMITY MMT: *can lift against gravity MMT Right eval Left eval Right 02/18/23 Left 02/18/23  Hip flexion 3/5 3/5 4+/5 4+/5  Hip extension      Hip abduction      Hip adduction      Hip internal rotation      Hip external rotation      Knee flexion 3/5 3/5 5/5 5/5  Knee extension 3/5 3/5 5/5 5/5  Ankle dorsiflexion 4/5 4/5    Ankle plantarflexion 3/5 3/5    Ankle inversion      Ankle eversion       (Blank rows = not tested) FUNCTIONAL TESTS:  5 time sit to stand=56.61  Gait speed=0.302 ft/sec *significantly slowed speed* GAIT: Distance walked: with [facility] slowed pace x 75 feet requiring increased time Assistive device utilized: Quad cane large base Level of assistance: Modified independence  slowed pace Comments: patient's quad cane is too high and she switches R and L sides-- PT to address Shoewear: Patient has to use slip on shoes and these slide during gait activities-- PT to address  Bed mobility: Independent climbing into high bed and rolling into bed  [facility] Adult PT Treatment:                                                DATE: 03/25/2023 Therapeutic Exercise: Standing: Heel/toe raises x10 Mini squat x10 Alt knee flexion x10 Side stepping at railing Supine: Bridges x10 SLR x10 (B) Side  Lying: Straight leg hip abd (foot propped on blocks) x10 (B) Straight leg arc lifts fwd/bkwd with blocks x10 (B) Standing: 2#DB lateral arm raises 2#DB bicep curls Therapeutic Activity: Walking + stepping low objects (dumbbells) Alt foot taps on low stepper with FWW    [facility] Adult PT Treatment:                                                DATE:  03/18/23 Therapeutic Exercise: Standing Heel/toe raises x 10 reps Mini knee bends x 10 reps Knee flexion alternating R and L x 10 reps Sidelying Hip abduction x 10 reps x 2 sets Supine SLR x 10 reps Marching x 10 reps Seated LAQ with 2# x 10 reps x 2 sets Red band hamstring curl x 10 reps R and L sides Sit<>stand x 10 reps Gait: 60 ft with RW mod indep 180 ft with RW mod indep    [facility] Adult PT Treatment:                                                DATE: 03/15/2023 Therapeutic Exercise: Counter: Side stepping Bkwd walking Lateral+posterior steps around targets Stepping out and around colored dot 2x5 (B) 1# overhead lift x 10 reps R and L for trunk and standing strength Standing balance with unilateral 5#KB swing Supine: Bilateral HS isometric press into orange PB x15 SLR x12 (B) S/L straight leg hip abd x10 Therapeutic Activity: High knee step through walking with FWW x80'    PATIENT EDUCATION:  Education details: Updated HEP Person educated: Patient Education method: Explanation, Demonstration, and Handouts Education comprehension: verbalized understanding, returned demonstration, and needs further education  HOME EXERCISE PROGRAM: Access Code: [email or web address] URL: [email or web address] Date: 03/11/2023 Prepared by: Margretta Ditty  Exercises - Supine Active Straight Leg Raise  - 1 x daily - 7 x weekly - 1 sets - 10 reps - Supine Quadriceps Stretch with Strap on Table  - 2 x daily - 7 x weekly - 1 sets - 3 reps - 30-60 sec hold - Seated Transversus Abdominis Bracing  - 1 x daily - 7 x weekly - 1 sets -  10 reps - 5 sec hold - Seated Long Arc Quad  - 1 x daily - 7 x weekly - 1 sets - 10 reps - Sit to Stand with Armchair  - 1 x daily - 7 x weekly - 1 sets - 5 reps - Lateral Step Up with Counter Support  - 1 x daily - 7 x weekly - 3 sets - 10 reps - Forward Step Up with Counter Support  - 1 x daily - 7 x weekly - 3 sets - 10 reps - Side Stepping with Counter Support  - 1 x daily - 7 x weekly - 3 sets - 10 reps - Forward and Backward Step Over with Counter Support  - 1 x daily - 7 x weekly - 1 sets - 10 reps  ASSESSMENT:  CLINICAL IMPRESSION: Hip strengthening progressed with side lying hip abd variations. Incorporated side stepping to progress functional LE strengthening. Rest taken as needed due to low energy level with patient today.   OBJECTIVE IMPAIRMENTS: Abnormal gait, decreased activity tolerance, decreased balance, decreased mobility, decreased strength, impaired flexibility, obesity, and pain.   GOALS: Goals reviewed with patient? Yes  UPDATED GOALS: SHORT TERM GOALS: Target date: 04/07/23  1.  The patient will report dec'd pain in R leg while driving.  Baseline: Notes pain with being in the car has increased.  Goal status: REVISED *reports on 03/18/23 that she modified set up using a seat cushion and lumbar support in the car.  LONG TERM GOALS: Target date: 05/07/23  The patient will be indep with progression of HEP. Baseline: initiated at eval. Goal status: REVISED   2.   The  patient will improve gait speed to > or equal to 1.3  ft/sec to demo transition to community ambulation status. Baseline:  0.302 ft/sec at eval and 1.11 ft/sec re-eval Goal status:  REVISED  4.  The patient will perform 5 time sit<>stand in < or equal to 20 seconds. Baseline: 56.61 seconds AT EVAL, 22.5 sec re-eval Goal status: REVISED  5.  The patient will report ability to stand at the kitchen counter x 10 minutes without sensation of not being able to lift her legs after standing. Baseline:   After standing in one spot, patient reports unable to lift her leg to walk Goal status: REVISED    PLAN:  PT FREQUENCY: 2x/week  PT DURATION: 8 weeks  PLANNED INTERVENTIONS: Therapeutic exercises, Therapeutic activity, Neuromuscular re-education, Balance training, Gait training, Patient/Family education, Self Care, and Joint mobilization  PLAN FOR NEXT SESSION: Continue LE/core strengthening, progressing standing endurance & gait training as tolerated.    Sanjuana Mae, PTA 03/25/2023

## 2023-03-28 ENCOUNTER — Ambulatory Visit: Payer: Medicare Other

## 2023-03-28 DIAGNOSIS — M6281 Muscle weakness (generalized): Secondary | ICD-10-CM | POA: Diagnosis not present

## 2023-03-28 DIAGNOSIS — R2689 Other abnormalities of gait and mobility: Secondary | ICD-10-CM | POA: Diagnosis not present

## 2023-03-28 DIAGNOSIS — G8929 Other chronic pain: Secondary | ICD-10-CM

## 2023-03-28 DIAGNOSIS — M25562 Pain in left knee: Secondary | ICD-10-CM | POA: Diagnosis not present

## 2023-03-28 DIAGNOSIS — M25561 Pain in right knee: Secondary | ICD-10-CM | POA: Diagnosis not present

## 2023-03-28 NOTE — Therapy (Signed)
OUTPATIENT PHYSICAL THERAPY LOWER EXTREMITY TREATMENT  Patient Name: Natalie Wright MRN: [id number] DOB:[birth year], 72 y.o., female Today's Date: 03/28/2023  PT End of Session - 03/28/23 1109     Visit Number 18    Number of Visits 29    Date for PT Re-Evaluation 05/07/23    Authorization Type medicare and tricare    Progress Note Due on Visit 20    PT Start Time 1107    PT Stop Time 1150    PT Time Calculation (min) 43 min    Activity Tolerance Patient tolerated treatment well    Behavior During Therapy WFL for tasks assessed/performed              Past Medical History:  Diagnosis Date   Allergy    to cats and dogs   Diabetes mellitus    Hiatal hernia    and delayed gastric emptying/ sees [facility] for recurrent diarrhea   History of cardiovascular disorder 12/10/2008   Qualifier: Diagnosis of  By: Linford Arnold MD, Catherine     Hypertension    OSA (obstructive sleep apnea)    CPAP   Pedal edema    Spastic bladder    urology in [place]   TIA (transient ischemic attack)    hx of- sees Dr Rayburn Ma (Neurology)   Past Surgical History:  Procedure Laterality Date   CATARACT EXTRACTION  08/2021   IR RADIOLOGIST EVAL & MGMT  03/08/2023   vaginal skin tag removal     Patient Active Problem List   Diagnosis Date Noted   Diabetic retinopathy (HCC) 02/26/2023   Lumbar spondylosis 07/03/2022   Lumbar spinal stenosis 02/26/2022   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/2022   Bilateral hip pain 01/12/2022   Hearing loss due to cerumen impaction, right 01/12/2022   Hammertoe, bilateral 08/23/2021   History of foot ulcer 08/23/2021   Primary osteoarthritis of both knees 12/09/2020   History of burning pain in leg 08/29/2020   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   Tremor 05/05/2020   Pre-ulcerative corn or callous 01/29/2020   History of transient ischemic attack (TIA) 05/15/2018   Family history of ovarian cancer 04/02/2017   Aortic  atherosclerosis (HCC) 08/31/2016   Venous stasis 08/10/2016   Palpitations 07/16/2016   Atrial dilatation, left 07/16/2016   Atrial septal aneurysm 07/16/2016   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   Cataract 04/18/2012   Hyperlipidemia 02/13/2011   Pulmonary nodules 09/22/2010   LEG PAIN 09/12/2009   Sleep apnea 04/13/2009   GERD 03/14/2009   Morbid obesity (HCC) 12/10/2008   Essential hypertension, benign 12/10/2008   RAYNAUD'S DISEASE 12/10/2008   OSTEOARTHRITIS 12/10/2008   URINARY INCONTINENCE 12/10/2008    PCP: [name], MD REFERRING PROVIDER: [name], MD REFERRING DIAG:  M17.0 (ICD-10-CM) - Primary osteoarthritis of both knees   THERAPY DIAG:  Muscle weakness (generalized)  Chronic pain of right knee  Chronic pain of left knee  Other abnormalities of gait and mobility  Rationale for Evaluation and Treatment: Rehabilitation  ONSET DATE: 12/17/22  SUBJECTIVE:  SUBJECTIVE STATEMENT: Patient reports she is doing more in standing and continues to notice an improvement in standing endurance.  PERTINENT HISTORY: Diabetes, HTN, TIA, spinal stenosis, polymyalgia rheumatica PAIN:  Are you having pain? Yes: NPRS scale: 5/10 most days/10 Pain location: back (hip to hip) and knees Pain description: "snap, crackle, pop" pain, instability at knees where they give out, and neuropathy in her feet Aggravating factors: trying to move after standing,  pivoting to step  Relieving factors: nothing  PRECAUTIONS: Fall  WEIGHT BEARING RESTRICTIONS: No  FALLS:  Has patient fallen in last 6 months? Yes. Number of falls 1  LIVING ENVIRONMENT: Lives with: lives alone spouse passed away Jun 16, 2022 Lives in: House/apartment Stairs: No Has following equipment at home: Quad cane large base and shower bench, grab bars, scooter (uses husband's scooter)  PATIENT GOALS: "I'm not sure. I don't know how much therapy will help before knee surgery."  Be able to get in and  out of her car easier-- right now, it is so slow and a long process.   OBJECTIVE:  (Measures in this section from initial evaluation unless otherwise noted) PATIENT SURVEYS:  FOTO n/a-- mobility focus SENSATION: Notes neuropathy with some burning in feet, dec'd sensation EDEMA:  Bilateral swelling noted in LEs LOWER EXTREMITY ROM: Active ROM Right eval Left eval  Hip flexion    Hip extension    Hip abduction    Hip adduction    Hip internal rotation    Hip external rotation    Knee flexion 110 110  Knee extension 0 0  Ankle dorsiflexion    Ankle plantarflexion    Ankle inversion    Ankle eversion     (Blank rows = not tested) LOWER EXTREMITY MMT: *can lift against gravity MMT Right eval Left eval Right 02/18/23 Left 02/18/23  Hip flexion 3/5 3/5 4+/5 4+/5  Hip extension      Hip abduction      Hip adduction      Hip internal rotation      Hip external rotation      Knee flexion 3/5 3/5 5/5 5/5  Knee extension 3/5 3/5 5/5 5/5  Ankle dorsiflexion 4/5 4/5    Ankle plantarflexion 3/5 3/5    Ankle inversion      Ankle eversion       (Blank rows = not tested) FUNCTIONAL TESTS:  5 time sit to stand=56.61  Gait speed=0.302 ft/sec *significantly slowed speed* GAIT: Distance walked: with [facility] slowed pace x 75 feet requiring increased time Assistive device utilized: Quad cane large base Level of assistance: Modified independence  slowed pace Comments: patient's quad cane is too high and she switches R and L sides-- PT to address Shoewear: Patient has to use slip on shoes and these slide during gait activities-- PT to address  Bed mobility: Independent climbing into high bed and rolling into bed   [facility] Adult PT Treatment:                                                DATE: 03/28/2023 Therapeutic Exercise: Standing: 1#D cross punches Alt arms 2#DB hammer curl + overhead press  Seated: HS iso press into orange PB 2x10 (B) Knee ext iso press into orange PB 2x10  (B) Side Lying: Straight leg hip abd (foot on block) 2x10 (B) Hip abd on diagonal x10 (B) 4" step: Alt foot taps Rocking forward --> progressing step up mechanics  Step up/down with reciprocal pattern x 5 leading each leg    [facility] Adult PT Treatment:  DATE: 03/25/2023 Therapeutic Exercise: Standing: Heel/toe raises x10 Mini squat x10 Alt knee flexion x10 Side stepping at railing Supine: Bridges x10 SLR x10 (B) Side Lying: Straight leg hip abd (foot propped on blocks) x10 (B) Straight leg arc lifts fwd/bkwd with blocks x10 (B) Standing: 2#DB lateral arm raises 2#DB bicep curls Therapeutic Activity: Walking + stepping low objects (dumbbells) Alt foot taps on low stepper with FWW    [facility] Adult PT Treatment:                                                DATE: 03/18/23 Therapeutic Exercise: Standing Heel/toe raises x 10 reps Mini knee bends x 10 reps Knee flexion alternating R and L x 10 reps Sidelying Hip abduction x 10 reps x 2 sets Supine SLR x 10 reps Marching x 10 reps Seated LAQ with 2# x 10 reps x 2 sets Red band hamstring curl x 10 reps R and L sides Sit<>stand x 10 reps Gait: 60 ft with RW mod indep 180 ft with RW mod indep    PATIENT EDUCATION:  Education details: Updated HEP Person educated: Patient Education method: Programmer, multimedia, Demonstration, and Handouts Education comprehension: verbalized understanding, returned demonstration, and needs further education  HOME EXERCISE PROGRAM: Access Code: [email or web address] URL: [email or web address] Date: 03/28/2023 Prepared by: Carlynn Herald  Exercises - Supine Active Straight Leg Raise  - 1 x daily - 7 x weekly - 1 sets - 10 reps - Supine Quadriceps Stretch with Strap on Table  - 2 x daily - 7 x weekly - 1 sets - 3 reps - 30-60 sec hold - Sit to Stand with Armchair  - 1 x daily - 7 x weekly - 1 sets - 5 reps - Lateral Step Up with Counter Support  - 1  x daily - 7 x weekly - 3 sets - 10 reps - Forward Step Up with Counter Support  - 1 x daily - 7 x weekly - 3 sets - 10 reps - Side Stepping with Counter Support  - 1 x daily - 7 x weekly - 3 sets - 10 reps - Forward and Backward Step Over with Counter Support  - 1 x daily - 7 x weekly - 1 sets - 10 reps - Sidelying Hip Abduction  - 1 x daily - 7 x weekly - 2 sets - 10 reps - Sidelying Diagonal Hip Abduction  - 1 x daily - 7 x weekly - 2 sets - 10 reps - Seated Isometric Knee Extension  - 1 x daily - 7 x weekly - 2 sets - 10 reps - 5 sec hold  ASSESSMENT:  CLINICAL IMPRESSION: LE strengthening and endurance progressed with added repetitions. Foot taps on low step progressed to step ups with reciprocal stepping pattern. Increased lumbar discomfort with standing dynamic balance exercises; patient had difficulty maintaining upright standing posture.   OBJECTIVE IMPAIRMENTS: Abnormal gait, decreased activity tolerance, decreased balance, decreased mobility, decreased strength, impaired flexibility, obesity, and pain.   GOALS: Goals reviewed with patient? Yes  UPDATED GOALS: SHORT TERM GOALS: Target date: 04/07/23  1.  The patient will report dec'd pain in R leg while driving.  Baseline: Notes pain with being in the car has increased.  Goal status: REVISED *reports on 03/18/23 that she modified set up using a seat cushion and lumbar support in the car.  LONG  TERM GOALS: Target date: 05/07/23  The patient will be indep with progression of HEP. Baseline: initiated at eval. Goal status: REVISED   2.   The patient will improve gait speed to > or equal to 1.3  ft/sec to demo transition to community ambulation status. Baseline:  0.302 ft/sec at eval and 1.11 ft/sec re-eval Goal status:  REVISED  4.  The patient will perform 5 time sit<>stand in < or equal to 20 seconds. Baseline: 56.61 seconds AT EVAL, 22.5 sec re-eval Goal status: REVISED  5.  The patient will report ability to stand at  the kitchen counter x 10 minutes without sensation of not being able to lift her legs after standing. Baseline:  After standing in one spot, patient reports unable to lift her leg to walk Goal status: REVISED   PLAN:  PT FREQUENCY: 2x/week  PT DURATION: 8 weeks  PLANNED INTERVENTIONS: Therapeutic exercises, Therapeutic activity, Neuromuscular re-education, Balance training, Gait training, Patient/Family education, Self Care, and Joint mobilization  PLAN FOR NEXT SESSION: Continue LE/core strengthening, progressing standing endurance & gait training as tolerated.    Sanjuana Mae, PTA 03/28/2023 12:00 PM

## 2023-04-01 ENCOUNTER — Ambulatory Visit: Payer: Medicare Other

## 2023-04-01 DIAGNOSIS — G8929 Other chronic pain: Secondary | ICD-10-CM | POA: Diagnosis not present

## 2023-04-01 DIAGNOSIS — R2689 Other abnormalities of gait and mobility: Secondary | ICD-10-CM | POA: Diagnosis not present

## 2023-04-01 DIAGNOSIS — M6281 Muscle weakness (generalized): Secondary | ICD-10-CM

## 2023-04-01 DIAGNOSIS — M25562 Pain in left knee: Secondary | ICD-10-CM | POA: Diagnosis not present

## 2023-04-01 DIAGNOSIS — M25561 Pain in right knee: Secondary | ICD-10-CM | POA: Diagnosis not present

## 2023-04-01 NOTE — Therapy (Signed)
OUTPATIENT PHYSICAL THERAPY LOWER EXTREMITY TREATMENT   Patient Name: Natalie Wright MRN: [id number] DOB:1951/01/27, 72 y.o., female Today's Date: 04/01/2023  PT End of Session - 04/01/23 1102     Visit Number 19    Number of Visits 29    Date for PT Re-Evaluation 05/07/23    Authorization Type medicare and tricare    Authorization - Visit Number 19    Progress Note Due on Visit 20    PT Start Time 1103    PT Stop Time 1141    PT Time Calculation (min) 38 min    Activity Tolerance Patient tolerated treatment well    Behavior During Therapy WFL for tasks assessed/performed              Past Medical History:  Diagnosis Date   Allergy    to cats and dogs   Diabetes mellitus    Hiatal hernia    and delayed gastric emptying/ sees [facility] for recurrent diarrhea   History of cardiovascular disorder 12/10/2008   Qualifier: Diagnosis of  By: Linford Arnold MD, Catherine     Hypertension    OSA (obstructive sleep apnea)    CPAP   Pedal edema    Spastic bladder    urology in [place]   TIA (transient ischemic attack)    hx of- sees Dr Rayburn Ma (Neurology)   Past Surgical History:  Procedure Laterality Date   CATARACT EXTRACTION  08/2021   IR RADIOLOGIST EVAL & MGMT  03/08/2023   vaginal skin tag removal     Patient Active Problem List   Diagnosis Date Noted   Diabetic retinopathy (HCC) 02/26/2023   Lumbar spondylosis 07/03/2022   Lumbar spinal stenosis 02/26/2022   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/2022   Bilateral hip pain 01/12/2022   Hearing loss due to cerumen impaction, right 01/12/2022   Hammertoe, bilateral 08/23/2021   History of foot ulcer 08/23/2021   Primary osteoarthritis of both knees 12/09/2020   History of burning pain in leg 08/29/2020   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   Tremor 05/05/2020   Pre-ulcerative corn or callous 01/29/2020   History of transient ischemic attack (TIA) 05/15/2018   Family history of  ovarian cancer 04/02/2017   Aortic atherosclerosis (HCC) 08/31/2016   Venous stasis 08/10/2016   Palpitations 07/16/2016   Atrial dilatation, left 07/16/2016   Atrial septal aneurysm 07/16/2016   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   Cataract 04/18/2012   Hyperlipidemia 02/13/2011   Pulmonary nodules 09/22/2010   LEG PAIN 09/12/2009   Sleep apnea 04/13/2009   GERD 03/14/2009   Morbid obesity (HCC) 12/10/2008   Essential hypertension, benign 12/10/2008   RAYNAUD'S DISEASE 12/10/2008   OSTEOARTHRITIS 12/10/2008   URINARY INCONTINENCE 12/10/2008    PCP: [name], MD REFERRING PROVIDER: [name], MD REFERRING DIAG:  M17.0 (ICD-10-CM) - Primary osteoarthritis of both knees   THERAPY DIAG:  Muscle weakness (generalized)  Chronic pain of right knee  Chronic pain of left knee  Other abnormalities of gait and mobility  Rationale for Evaluation and Treatment: Rehabilitation  ONSET DATE: 12/17/22  SUBJECTIVE:  SUBJECTIVE STATEMENT: Patient reports the back pain is getting worse than knees, states back pain is radiating from center of low back and down into thighs. Patient states she is still able to tolerate standing for longer times as compared to prior to starting therapy.    PERTINENT HISTORY: Diabetes, HTN, TIA, spinal stenosis, polymyalgia rheumatica PAIN:  Are you having pain? Yes: NPRS  scale: 5/10 most days/10 Pain location: back (hip to hip) and knees Pain description: "snap, crackle, pop" pain, instability at knees where they give out, and neuropathy in her feet Aggravating factors: trying to move after standing, pivoting to step  Relieving factors: nothing  PRECAUTIONS: Fall  WEIGHT BEARING RESTRICTIONS: No  FALLS:  Has patient fallen in last 6 months? Yes. Number of falls 1  LIVING ENVIRONMENT: Lives with: lives alone spouse passed away 06-11-2022 Lives in: House/apartment Stairs: No Has following equipment at home: Quad cane large base  and shower bench, grab bars, scooter (uses husband's scooter)  PATIENT GOALS: "I'm not sure. I don't know how much therapy will help before knee surgery."  Be able to get in and out of her car easier-- right now, it is so slow and a long process.   OBJECTIVE:  (Measures in this section from initial evaluation unless otherwise noted) PATIENT SURVEYS:  FOTO n/a-- mobility focus SENSATION: Notes neuropathy with some burning in feet, dec'd sensation EDEMA:  Bilateral swelling noted in LEs LOWER EXTREMITY ROM: Active ROM Right eval Left eval  Hip flexion    Hip extension    Hip abduction    Hip adduction    Hip internal rotation    Hip external rotation    Knee flexion 110 110  Knee extension 0 0  Ankle dorsiflexion    Ankle plantarflexion    Ankle inversion    Ankle eversion     (Blank rows = not tested) LOWER EXTREMITY MMT: *can lift against gravity MMT Right eval Left eval Right 02/18/23 Left 02/18/23  Hip flexion 3/5 3/5 4+/5 4+/5  Hip extension      Hip abduction      Hip adduction      Hip internal rotation      Hip external rotation      Knee flexion 3/5 3/5 5/5 5/5  Knee extension 3/5 3/5 5/5 5/5  Ankle dorsiflexion 4/5 4/5    Ankle plantarflexion 3/5 3/5    Ankle inversion      Ankle eversion       (Blank rows = not tested) FUNCTIONAL TESTS:  5 time sit to stand=56.61  Gait speed=0.302 ft/sec *significantly slowed speed* GAIT: Distance walked: with [facility] slowed pace x 75 feet requiring increased time Assistive device utilized: Quad cane large base Level of assistance: Modified independence  slowed pace Comments: patient's quad cane is too high and she switches R and L sides-- PT to address Shoewear: Patient has to use slip on shoes and these slide during gait activities-- PT to address  Bed mobility: Independent climbing into high bed and rolling into bed   [facility] Adult PT Treatment:                                                DATE:  04/01/2023 Therapeutic Exercise: Supine: Wide leg windshield wiper legs (hip IR/ER AROM) SLR 2x10 Single leg circles CW/CCW 2x5 each (B) Side Lying: Straight leg hip abd (leading with heel) 2x10  Small leg circles in hip abd x5 CW/CCW each  Hip abd on diagonal x10 (foot to block in ext)  Manual Therapy: S/L: Roller stick to glutes, lateral thigh -> anterior/lateral quads     [facility] Adult PT Treatment:  DATE: 03/28/2023 Therapeutic Exercise: Standing: 1#D cross punches Alt arms 2#DB hammer curl + overhead press  Seated: HS iso press into orange PB 2x10 (B) Knee ext iso press into orange PB 2x10 (B) Side Lying: Straight leg hip abd (foot on block) 2x10 (B) Hip abd on diagonal x10 (B) 4" step: Alt foot taps Rocking forward --> progressing step up mechanics  Step up/down with reciprocal pattern x 5 leading each leg    [facility] Adult PT Treatment:                                                DATE: 03/25/2023 Therapeutic Exercise: Standing: Heel/toe raises x10 Mini squat x10 Alt knee flexion x10 Side stepping at railing Supine: Bridges x10 SLR x10 (B) Side Lying: Straight leg hip abd (foot propped on blocks) x10 (B) Straight leg arc lifts fwd/bkwd with blocks x10 (B) Standing: 2#DB lateral arm raises 2#DB bicep curls Therapeutic Activity: Walking + stepping low objects (dumbbells) Alt foot taps on low stepper with FWW    PATIENT EDUCATION:  Education details: Updated HEP Person educated: Patient Education method: Explanation, Demonstration, and Handouts Education comprehension: verbalized understanding, returned demonstration, and needs further education  HOME EXERCISE PROGRAM: Access Code: [email or web address] URL: [email or web address] Date: 03/28/2023 Prepared by: Carlynn Herald  Exercises - Supine Active Straight Leg Raise  - 1 x daily - 7 x weekly - 1 sets - 10 reps - Supine Quadriceps Stretch with Strap on  Table  - 2 x daily - 7 x weekly - 1 sets - 3 reps - 30-60 sec hold - Sit to Stand with Armchair  - 1 x daily - 7 x weekly - 1 sets - 5 reps - Lateral Step Up with Counter Support  - 1 x daily - 7 x weekly - 3 sets - 10 reps - Forward Step Up with Counter Support  - 1 x daily - 7 x weekly - 3 sets - 10 reps - Side Stepping with Counter Support  - 1 x daily - 7 x weekly - 3 sets - 10 reps - Forward and Backward Step Over with Counter Support  - 1 x daily - 7 x weekly - 1 sets - 10 reps - Sidelying Hip Abduction  - 1 x daily - 7 x weekly - 2 sets - 10 reps - Sidelying Diagonal Hip Abduction  - 1 x daily - 7 x weekly - 2 sets - 10 reps - Seated Isometric Knee Extension  - 1 x daily - 7 x weekly - 2 sets - 10 reps - 5 sec hold  ASSESSMENT:  CLINICAL IMPRESSION: Standing exercises and walking activities deferred today due to increased back pain in those positions. Hip and glute strengthening continued, challenging strength and stabilization of opposite side; modifications provided as needed to decrease aggravation of low back pain.   OBJECTIVE IMPAIRMENTS: Abnormal gait, decreased activity tolerance, decreased balance, decreased mobility, decreased strength, impaired flexibility, obesity, and pain.   GOALS: Goals reviewed with patient? Yes  UPDATED GOALS: SHORT TERM GOALS: Target date: 04/07/23  1.  The patient will report dec'd pain in R leg while driving.  Baseline: Notes pain with being in the car has increased.  Goal status: REVISED *reports on 03/18/23 that she modified set up using a seat cushion and lumbar support in the car.  LONG TERM GOALS: Target date:  05/07/23  The patient will be indep with progression of HEP. Baseline: initiated at eval. Goal status: REVISED   2.   The patient will improve gait speed to > or equal to 1.3  ft/sec to demo transition to community ambulation status. Baseline:  0.302 ft/sec at eval and 1.11 ft/sec re-eval Goal status:  REVISED  4.  The patient  will perform 5 time sit<>stand in < or equal to 20 seconds. Baseline: 56.61 seconds AT EVAL, 22.5 sec re-eval Goal status: REVISED  5.  The patient will report ability to stand at the kitchen counter x 10 minutes without sensation of not being able to lift her legs after standing. Baseline:  After standing in one spot, patient reports unable to lift her leg to walk Goal status: REVISED   PLAN:  PT FREQUENCY: 2x/week  PT DURATION: 8 weeks  PLANNED INTERVENTIONS: Therapeutic exercises, Therapeutic activity, Neuromuscular re-education, Balance training, Gait training, Patient/Family education, Self Care, and Joint mobilization  PLAN FOR NEXT SESSION: Continue LE/core strengthening, progressing standing endurance & gait training as tolerated. Manual (rollerstick) as needed.   Sanjuana Mae, PTA 04/01/2023 11:41 AM

## 2023-04-05 ENCOUNTER — Ambulatory Visit: Payer: Medicare Other | Attending: Sports Medicine

## 2023-04-05 DIAGNOSIS — R2689 Other abnormalities of gait and mobility: Secondary | ICD-10-CM | POA: Diagnosis present

## 2023-04-05 DIAGNOSIS — M25562 Pain in left knee: Secondary | ICD-10-CM | POA: Insufficient documentation

## 2023-04-05 DIAGNOSIS — M6281 Muscle weakness (generalized): Secondary | ICD-10-CM

## 2023-04-05 DIAGNOSIS — M25561 Pain in right knee: Secondary | ICD-10-CM | POA: Diagnosis present

## 2023-04-05 DIAGNOSIS — G8929 Other chronic pain: Secondary | ICD-10-CM | POA: Diagnosis present

## 2023-04-05 NOTE — Therapy (Addendum)
OUTPATIENT PHYSICAL THERAPY LOWER EXTREMITY TREATMENT AND PROGRESS NOTE  Patient Name: Natalie Wright MRN: [id number] DOB:1950-08-31, 72 y.o., female Today's Date: 04/05/2023   Physical Therapy Progress Note   Dates of Reporting Period:12/31/22 TO 04/17/23   Objective Measurements: improved gait speed and 5 time sit to stand.  See goals  Reason Skilled Services are Required: Patient is continuing to make progress with mobility and is able to stand for more ADLs. PT continuing to progress HEP, strengthening, and functional mobility.  Thank you for the referral of this patient.    PT End of Session - 04/05/23 1058     Visit Number 20    Number of Visits 29    Date for PT Re-Evaluation 05/07/23    Authorization Type medicare and tricare    Progress Note Due on Visit 20    PT Start Time 1104    PT Stop Time 1145    PT Time Calculation (min) 41 min    Activity Tolerance Patient tolerated treatment well    Behavior During Therapy WFL for tasks assessed/performed              Past Medical History:  Diagnosis Date   Allergy    to cats and dogs   Diabetes mellitus    Hiatal hernia    and delayed gastric emptying/ sees [facility] for recurrent diarrhea   History of cardiovascular disorder 12/10/2008   Qualifier: Diagnosis of  By: Linford Arnold MD, Catherine     Hypertension    OSA (obstructive sleep apnea)    CPAP   Pedal edema    Spastic bladder    urology in [facility]   TIA (transient ischemic attack)    hx of- sees Dr Rayburn Ma (Neurology)   Past Surgical History:  Procedure Laterality Date   CATARACT EXTRACTION  08/2021   IR RADIOLOGIST EVAL & MGMT  03/08/2023   vaginal skin tag removal     Patient Active Problem List   Diagnosis Date Noted   Diabetic retinopathy (HCC) 02/26/2023   Lumbar spondylosis 07/03/2022   Lumbar spinal stenosis 02/26/2022   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/2022   Bilateral hip pain 01/12/2022   Hearing loss due to cerumen impaction,  right 01/12/2022   Hammertoe, bilateral 08/23/2021   History of foot ulcer 08/23/2021   Primary osteoarthritis of both knees 12/09/2020   History of burning pain in leg 08/29/2020   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   Tremor 05/05/2020   Pre-ulcerative corn or callous 01/29/2020   History of transient ischemic attack (TIA) 05/15/2018   Family history of ovarian cancer 04/02/2017   Aortic atherosclerosis (HCC) 08/31/2016   Venous stasis 08/10/2016   Palpitations 07/16/2016   Atrial dilatation, left 07/16/2016   Atrial septal aneurysm 07/16/2016   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   Cataract 04/18/2012   Hyperlipidemia 02/13/2011   Pulmonary nodules 09/22/2010   LEG PAIN 09/12/2009   Sleep apnea 04/13/2009   GERD 03/14/2009   Morbid obesity (HCC) 12/10/2008   Essential hypertension, benign 12/10/2008   RAYNAUD'S DISEASE 12/10/2008   Osteoarthritis 12/10/2008   URINARY INCONTINENCE 12/10/2008    PCP: [name], MD REFERRING PROVIDER: [name], MD REFERRING DIAG:  M17.0 (ICD-10-CM) - Primary osteoarthritis of both knees   THERAPY DIAG:  Muscle weakness (generalized)  Chronic pain of right knee  Chronic pain of left knee  Other abnormalities of gait and mobility  Rationale for Evaluation and Treatment: Rehabilitation  ONSET DATE: 12/17/22  SUBJECTIVE:  SUBJECTIVE STATEMENT: Patient reports she felt really good after last visit with the roller stick. Patient states she is able to tolerate standing longer with cooking, etc and is having less "slippage of the bones at the knees". Patient states she is able to transition from sitting to standing with more ease and increased strength in legs. Patient states she has her appointment for bilateral knee ablation next Friday.   PERTINENT HISTORY: Diabetes, HTN, TIA, spinal stenosis, polymyalgia rheumatica PAIN:  Are you having pain? Yes: NPRS scale: 5/10 most  days/10 Pain location: back (hip to hip) and knees Pain description: "snap, crackle, pop" pain, instability at knees where they give out, and neuropathy in her feet Aggravating factors: trying to move after standing, pivoting to step  Relieving factors: nothing  PRECAUTIONS: Fall  WEIGHT BEARING RESTRICTIONS: No  FALLS:  Has patient fallen in last 6 months? Yes. Number of falls 1  LIVING ENVIRONMENT: Lives with: lives alone spouse passed away 06-01-22 Lives in: House/apartment Stairs: No Has following equipment at home: Quad cane large base and shower bench, grab bars, scooter (uses husband's scooter)  PATIENT GOALS: "I'm not sure. I don't know how much therapy will help before knee surgery."  Be able to get in and out of her car easier-- right now, it is so slow and a long process.   OBJECTIVE:  (Measures in this section from initial evaluation unless otherwise noted) PATIENT SURVEYS:  FOTO n/a-- mobility focus SENSATION: Notes neuropathy with some burning in feet, dec'd sensation EDEMA:  Bilateral swelling noted in LEs LOWER EXTREMITY ROM: Active ROM Right eval Left eval Right 04/05/23 Left 04/05/23  Hip flexion      Hip extension      Hip abduction      Hip adduction      Hip internal rotation      Hip external rotation      Knee flexion 110 110 111 105  Knee extension 0 0    Ankle dorsiflexion      Ankle plantarflexion      Ankle inversion      Ankle eversion       (Blank rows = not tested) LOWER EXTREMITY MMT: *can lift against gravity MMT Right eval Left eval Right 02/18/23 Left 02/18/23 Right 04/05/23 Left 04/05/23  Hip flexion 3/5 3/5 4+/5 4+/5 4+/5 4+/5  Hip extension        Hip abduction        Hip adduction        Hip internal rotation        Hip external rotation        Knee flexion 3/5 3/5 5/5 5/5    Knee extension 3/5 3/5 5/5 5/5    Ankle dorsiflexion 4/5 4/5      Ankle plantarflexion 3/5 3/5      Ankle inversion        Ankle eversion          (Blank rows = not tested) FUNCTIONAL TESTS:  5 time sit to stand=56.61  Gait speed=0.302 ft/sec *significantly slowed speed* GAIT: Distance walked: with [facility] slowed pace x 75 feet requiring increased time Assistive device utilized: Quad cane large base Level of assistance: Modified independence  slowed pace Comments: patient's quad cane is too high and she switches R and L sides-- PT to address Shoewear: Patient has to use slip on shoes and these slide during gait activities-- PT to address  Bed mobility: Independent climbing into high bed and rolling into bed  [facility] Adult PT Treatment:                                                DATE: 04/05/2023 Therapeutic Exercise: Hip flexion MMT (see above) Knee flexion measurements (see above) Supine:  SLR 2x10 (B) Single leg circles CW/CCW 2x5 each (B) Side Lying: Straight leg hip abd (leading with heel) 2x10 Small leg circles in hip abd x5 CW/CCW each  Hip abd on diagonal x10, x5 (foot to block in ext)  Timed STS (LTG #4) Gait speed (LTG #2) Manual Therapy: S/L: Roller stick to glutes, lateral thigh -> anterior/lateral quads    [facility] Adult PT Treatment:                                                DATE: 04/01/2023 Therapeutic Exercise: Supine: Wide leg windshield wiper legs (hip IR/ER AROM) SLR 2x10 Single leg circles CW/CCW 2x5 each (B) Side Lying: Straight leg hip abd (leading with heel) 2x10  Small leg circles in hip abd 2x5 CW/CCW each  Hip abd on diagonal 2x10 (foot to block in ext)  Manual Therapy: Side Lying (B) --> Roller stick to glutes, lateral thigh -> anterior/lateral quads     [facility] Adult PT Treatment:                                                DATE: 03/28/2023 Therapeutic Exercise: Standing: 1#D cross punches Alt arms 2#DB hammer curl + overhead press  Seated: HS iso press into orange PB 2x10 (B) Knee ext iso press into orange PB 2x10 (B) Side Lying: Straight leg hip abd (foot on block) 2x10 (B) Hip  abd on diagonal x10 (B) 4" step: Alt foot taps Rocking forward --> progressing step up mechanics  Step up/down with reciprocal pattern x 5 leading each leg    PATIENT EDUCATION:  Education details: Updated HEP Person educated: Patient Education method: Explanation, Demonstration, and Handouts Education comprehension: verbalized understanding, returned demonstration, and needs further education  HOME EXERCISE PROGRAM: Access Code: [email or web address] URL: [email or web address] Date: 03/28/2023 Prepared by: Carlynn Herald  Exercises - Supine Active Straight Leg Raise  - 1 x daily - 7 x weekly - 1 sets - 10 reps - Supine Quadriceps Stretch with Strap on Table  - 2 x daily - 7 x weekly - 1 sets - 3 reps - 30-60 sec hold - Sit to Stand with Armchair  - 1 x daily - 7 x weekly - 1 sets - 5 reps - Lateral Step Up with Counter Support  - 1 x daily - 7 x weekly - 3 sets - 10 reps - Forward Step Up with Counter Support  - 1 x daily - 7 x weekly - 3 sets - 10 reps - Side Stepping with Counter Support  - 1 x daily - 7 x weekly - 3 sets - 10 reps - Forward and Backward Step Over with Counter Support  - 1 x daily - 7 x weekly - 1 sets - 10 reps - Sidelying Hip Abduction  - 1 x daily - 7 x weekly -  2 sets - 10 reps - Sidelying Diagonal Hip Abduction  - 1 x daily - 7 x weekly - 2 sets - 10 reps - Seated Isometric Knee Extension  - 1 x daily - 7 x weekly - 2 sets - 10 reps - 5 sec hold  ASSESSMENT:  CLINICAL IMPRESSION: Slight increase in R knee flexion and mild regression in L knee flexion measured today; patient reports no increased pain with active assist range of measurement with knee flexion. Very good progression exhibited with hip flexion strength and knee flexion/extension as compared to findings at initial evaluation. Patient demonstrates improved LE strength and is able to tolerate increased repetitions with hip and quad strengthening exercises. No significant change in gait speed since  measured at re-eval, however patient demonstrated improved mobility and less pain during ambulation. Patient will continue to benefit from skilled therapy to progress endurance    OBJECTIVE IMPAIRMENTS: Abnormal gait, decreased activity tolerance, decreased balance, decreased mobility, decreased strength, impaired flexibility, obesity, and pain.   GOALS: Goals reviewed with patient? Yes  UPDATED GOALS: SHORT TERM GOALS: Target date: 04/07/23  1.  The patient will report dec'd pain in R leg while driving.  Baseline: Notes pain with being in the car has increased.  Goal status: REVISED *reports on 03/18/23 that she modified set up using a seat cushion and lumbar support in the car.  LONG TERM GOALS: Target date: 05/07/23  The patient will be indep with progression of HEP. Baseline: doing HEP 1-2x/week Goal status: IN PROGRESS   2.   The patient will improve gait speed to > or equal to 1.3  ft/sec to demo transition to community ambulation status. Baseline:  1.11 ft/sec  Goal status:  IN PROGRESS  4.  The patient will perform 5 time sit<>stand in < or equal to 20 seconds. Baseline: 19 sec Goal status: MET  5.  The patient will report ability to stand at the kitchen counter x 10 minutes without sensation of not being able to lift her legs after standing. Baseline:  Able to lift leg with less pain Goal status: IN PROGRESS   PLAN:  PT FREQUENCY: 2x/week  PT DURATION: 8 weeks  PLANNED INTERVENTIONS: Therapeutic exercises, Therapeutic activity, Neuromuscular re-education, Balance training, Gait training, Patient/Family education, Self Care, and Joint mobilization  PLAN FOR NEXT SESSION: Continue LE/core strengthening, progressing standing endurance & gait training as tolerated. Manual (rollerstick) as needed.   Sanjuana Mae, PTA 04/05/2023 11:46 AM  WEAVER,CHRISTINA, PT 04/17/2023

## 2023-04-06 ENCOUNTER — Other Ambulatory Visit: Payer: Self-pay | Admitting: Family Medicine

## 2023-04-09 ENCOUNTER — Ambulatory Visit: Payer: Medicare Other | Admitting: Physical Therapy

## 2023-04-09 ENCOUNTER — Encounter: Payer: Self-pay | Admitting: Physical Therapy

## 2023-04-09 ENCOUNTER — Ambulatory Visit: Payer: Medicare Other

## 2023-04-09 DIAGNOSIS — M25562 Pain in left knee: Secondary | ICD-10-CM | POA: Diagnosis not present

## 2023-04-09 DIAGNOSIS — R2689 Other abnormalities of gait and mobility: Secondary | ICD-10-CM

## 2023-04-09 DIAGNOSIS — M6281 Muscle weakness (generalized): Secondary | ICD-10-CM

## 2023-04-09 DIAGNOSIS — G8929 Other chronic pain: Secondary | ICD-10-CM | POA: Diagnosis not present

## 2023-04-09 DIAGNOSIS — M25561 Pain in right knee: Secondary | ICD-10-CM | POA: Diagnosis not present

## 2023-04-09 NOTE — Therapy (Signed)
OUTPATIENT PHYSICAL THERAPY LOWER EXTREMITY TREATMENT and 20th VISIT NOTE  Patient Name: Natalie Wright MRN: [id number] DOB:Nov 25, 1950, 71 y.o., female Today's Date: 04/09/2023 Dates of service: 12-31-22 to 04-09-23  PT End of Session - 04/09/23 1357     Visit Number 21    Number of Visits 29    Date for PT Re-Evaluation 05/07/23    Authorization Type medicare and tricare    Authorization - Visit Number 20    Progress Note Due on Visit 30    PT Start Time 1315    PT Stop Time 1355    PT Time Calculation (min) 40 min    Activity Tolerance Patient tolerated treatment well    Behavior During Therapy WFL for tasks assessed/performed               Past Medical History:  Diagnosis Date   Allergy    to cats and dogs   Diabetes mellitus    Hiatal hernia    and delayed gastric emptying/ sees [facility] for recurrent diarrhea   History of cardiovascular disorder 12/10/2008   Qualifier: Diagnosis of  By: Linford Arnold MD, Catherine     Hypertension    OSA (obstructive sleep apnea)    CPAP   Pedal edema    Spastic bladder    urology in [place]   TIA (transient ischemic attack)    hx of- sees Dr Rayburn Ma (Neurology)   Past Surgical History:  Procedure Laterality Date   CATARACT EXTRACTION  08/2021   IR RADIOLOGIST EVAL & MGMT  03/08/2023   vaginal skin tag removal     Patient Active Problem List   Diagnosis Date Noted   Diabetic retinopathy (HCC) 02/26/2023   Lumbar spondylosis 07/03/2022   Lumbar spinal stenosis 02/26/2022   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/2022   Bilateral hip pain 01/12/2022   Hearing loss due to cerumen impaction, right 01/12/2022   Hammertoe, bilateral 08/23/2021   History of foot ulcer 08/23/2021   Primary osteoarthritis of both knees 12/09/2020   History of burning pain in leg 08/29/2020   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   Tremor 05/05/2020   Pre-ulcerative corn or callous 01/29/2020   History of  transient ischemic attack (TIA) 05/15/2018   Family history of ovarian cancer 04/02/2017   Aortic atherosclerosis (HCC) 08/31/2016   Venous stasis 08/10/2016   Palpitations 07/16/2016   Atrial dilatation, left 07/16/2016   Atrial septal aneurysm 07/16/2016   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   Cataract 04/18/2012   Hyperlipidemia 02/13/2011   Pulmonary nodules 09/22/2010   LEG PAIN 09/12/2009   Sleep apnea 04/13/2009   GERD 03/14/2009   Morbid obesity (HCC) 12/10/2008   Essential hypertension, benign 12/10/2008   RAYNAUD'S DISEASE 12/10/2008   Osteoarthritis 12/10/2008   URINARY INCONTINENCE 12/10/2008    PCP: [name], MD REFERRING PROVIDER: [name], MD REFERRING DIAG:  M17.0 (ICD-10-CM) - Primary osteoarthritis of both knees   THERAPY DIAG:  Muscle weakness (generalized)  Chronic pain of right knee  Chronic pain of left knee  Other abnormalities of gait and mobility  Rationale for Evaluation and Treatment: Rehabilitation  ONSET DATE: 12/17/22  SUBJECTIVE:  SUBJECTIVE STATEMENT: Pt states she is sore due to working in her garage all weekend. She bought a massage gun and has been using it  PERTINENT HISTORY: Diabetes, HTN, TIA, spinal stenosis, polymyalgia rheumatica PAIN:  Are you having pain? Yes: NPRS scale: 6/10 /10 Pain location: back (hip to hip) and knees Pain  description: "snap, crackle, pop" pain, instability at knees where they give out, and neuropathy in her feet Aggravating factors: trying to move after standing, pivoting to step  Relieving factors: nothing  PRECAUTIONS: Fall  WEIGHT BEARING RESTRICTIONS: No  FALLS:  Has patient fallen in last 6 months? Yes. Number of falls 1  LIVING ENVIRONMENT: Lives with: lives alone spouse passed away 05-28-2022 Lives in: House/apartment Stairs: No Has following equipment at home: Quad cane large base and shower bench, grab bars, scooter (uses husband's scooter)  PATIENT GOALS: "I'm  not sure. I don't know how much therapy will help before knee surgery."  Be able to get in and out of her car easier-- right now, it is so slow and a long process.   OBJECTIVE:  (Measures in this section from initial evaluation unless otherwise noted) PATIENT SURVEYS:  FOTO n/a-- mobility focus SENSATION: Notes neuropathy with some burning in feet, dec'd sensation EDEMA:  Bilateral swelling noted in LEs LOWER EXTREMITY ROM: Active ROM Right eval Left eval Right 04/05/23 Left 04/05/23  Hip flexion      Hip extension      Hip abduction      Hip adduction      Hip internal rotation      Hip external rotation      Knee flexion 110 110 111 105  Knee extension 0 0    Ankle dorsiflexion      Ankle plantarflexion      Ankle inversion      Ankle eversion       (Blank rows = not tested) LOWER EXTREMITY MMT: *can lift against gravity MMT Right eval Left eval Right 02/18/23 Left 02/18/23 Right 04/05/23 Left 04/05/23  Hip flexion 3/5 3/5 4+/5 4+/5 4+/5 4+/5  Hip extension        Hip abduction        Hip adduction        Hip internal rotation        Hip external rotation        Knee flexion 3/5 3/5 5/5 5/5    Knee extension 3/5 3/5 5/5 5/5    Ankle dorsiflexion 4/5 4/5      Ankle plantarflexion 3/5 3/5      Ankle inversion        Ankle eversion         (Blank rows = not tested) FUNCTIONAL TESTS:  5 time sit to stand=56.61  Gait speed=0.302 ft/sec *significantly slowed speed* GAIT: Distance walked: with [facility] slowed pace x 75 feet requiring increased time Assistive device utilized: Quad cane large base Level of assistance: Modified independence  slowed pace Comments: patient's quad cane is too high and she switches R and L sides-- PT to address Shoewear: Patient has to use slip on shoes and these slide during gait activities-- PT to address   [facility] Adult PT Treatment:                                                DATE: 04/09/23 Therapeutic Exercise: Supine: windshield wiper  legs (hip IR/ER), SLR 2 x 10, single leg circles x 10 bilat CW/CCW Sidelying: straight leg hip abd 2 x 10 Standing : step ups 4'' step x 10 bilat, hip ext diagonals x 10 bilat, sit <> stand 2 x 5   [facility] Adult PT Treatment:  DATE: 04/05/2023 Therapeutic Exercise: Hip flexion MMT (see above) Knee flexion measurements (see above) Supine:  SLR 2x10 (B) Single leg circles CW/CCW 2x5 each (B) Side Lying: Straight leg hip abd (leading with heel) 2x10 Small leg circles in hip abd x5 CW/CCW each  Hip abd on diagonal x10, x5 (foot to block in ext)  Timed STS (LTG #4) Gait speed (LTG #2) Manual Therapy: S/L: Roller stick to glutes, lateral thigh -> anterior/lateral quads    [facility] Adult PT Treatment:                                                DATE: 04/01/2023 Therapeutic Exercise: Supine: Wide leg windshield wiper legs (hip IR/ER AROM) SLR 2x10 Single leg circles CW/CCW 2x5 each (B) Side Lying: Straight leg hip abd (leading with heel) 2x10  Small leg circles in hip abd 2x5 CW/CCW each  Hip abd on diagonal 2x10 (foot to block in ext)  Manual Therapy: Side Lying (B) --> Roller stick to glutes, lateral thigh -> anterior/lateral quads   PATIENT EDUCATION:  Education details: Updated HEP Person educated: Patient Education method: Explanation, Demonstration, and Handouts Education comprehension: verbalized understanding, returned demonstration, and needs further education  HOME EXERCISE PROGRAM: Access Code: [email or web address] URL: [email or web address] Date: 03/28/2023 Prepared by: Carlynn Herald  Exercises - Supine Active Straight Leg Raise  - 1 x daily - 7 x weekly - 1 sets - 10 reps - Supine Quadriceps Stretch with Strap on Table  - 2 x daily - 7 x weekly - 1 sets - 3 reps - 30-60 sec hold - Sit to Stand with Armchair  - 1 x daily - 7 x weekly - 1 sets - 5 reps - Lateral Step Up with Counter Support  - 1 x daily - 7 x weekly - 3  sets - 10 reps - Forward Step Up with Counter Support  - 1 x daily - 7 x weekly - 3 sets - 10 reps - Side Stepping with Counter Support  - 1 x daily - 7 x weekly - 3 sets - 10 reps - Forward and Backward Step Over with Counter Support  - 1 x daily - 7 x weekly - 1 sets - 10 reps - Sidelying Hip Abduction  - 1 x daily - 7 x weekly - 2 sets - 10 reps - Sidelying Diagonal Hip Abduction  - 1 x daily - 7 x weekly - 2 sets - 10 reps - Seated Isometric Knee Extension  - 1 x daily - 7 x weekly - 2 sets - 10 reps - 5 sec hold  ASSESSMENT:  CLINICAL IMPRESSION: Pt has met goal of decreasing Rt LE pain when driving. She states her leg "still acts up sometimes" but that overall there is decreased pain. Pt continues to require seated rest breaks during standing exercises and will benefit from continued endurance training. Roller stick massage held as pt plans to use massage gun at home   OBJECTIVE IMPAIRMENTS: Abnormal gait, decreased activity tolerance, decreased balance, decreased mobility, decreased strength, impaired flexibility, obesity, and pain.   GOALS: Goals reviewed with patient? Yes  UPDATED GOALS: SHORT TERM GOALS: Target date: 04/07/23  1.  The patient will report dec'd pain in R leg while driving.  Baseline: Notes pain with being in the car has increased.  Goal status: MET *reports on 03/18/23 that she  modified set up using a seat cushion and lumbar support in the car.  LONG TERM GOALS: Target date: 05/07/23  The patient will be indep with progression of HEP. Baseline: doing HEP 1-2x/week Goal status: IN PROGRESS   2.   The patient will improve gait speed to > or equal to 1.3  ft/sec to demo transition to community ambulation status. Baseline:  1.11 ft/sec  Goal status:  IN PROGRESS  4.  The patient will perform 5 time sit<>stand in < or equal to 20 seconds. Baseline: 19 sec Goal status: MET  5.  The patient will report ability to stand at the kitchen counter x 10 minutes  without sensation of not being able to lift her legs after standing. Baseline:  Able to lift leg with less pain Goal status: IN PROGRESS   PLAN:  PT FREQUENCY: 2x/week  PT DURATION: 8 weeks  PLANNED INTERVENTIONS: Therapeutic exercises, Therapeutic activity, Neuromuscular re-education, Balance training, Gait training, Patient/Family education, Self Care, and Joint mobilization  PLAN FOR NEXT SESSION: Continue LE/core strengthening, progressing standing endurance & gait training as tolerated. Manual (rollerstick) as needed.   Kyian Obst, PT 04/09/2023 1:58 PM

## 2023-04-11 ENCOUNTER — Ambulatory Visit: Payer: Medicare Other | Admitting: Physical Therapy

## 2023-04-11 ENCOUNTER — Encounter: Payer: Self-pay | Admitting: Physical Therapy

## 2023-04-11 DIAGNOSIS — M6281 Muscle weakness (generalized): Secondary | ICD-10-CM

## 2023-04-11 DIAGNOSIS — G8929 Other chronic pain: Secondary | ICD-10-CM | POA: Diagnosis not present

## 2023-04-11 DIAGNOSIS — M25562 Pain in left knee: Secondary | ICD-10-CM | POA: Diagnosis not present

## 2023-04-11 DIAGNOSIS — R2689 Other abnormalities of gait and mobility: Secondary | ICD-10-CM | POA: Diagnosis not present

## 2023-04-11 DIAGNOSIS — M25561 Pain in right knee: Secondary | ICD-10-CM | POA: Diagnosis not present

## 2023-04-11 NOTE — Therapy (Signed)
OUTPATIENT PHYSICAL THERAPY LOWER EXTREMITY TREATMENT   Patient Name: Natalie Wright MRN: [id number] DOB:02-07-1951, 72 y.o., female Today's Date: 04/11/2023 Dates of service: 12-31-22 to 04-09-23  PT End of Session - 04/11/23 1314     Visit Number 22    Number of Visits 29    Date for PT Re-Evaluation 05/07/23    Authorization Type medicare and tricare    Authorization - Visit Number 22    Progress Note Due on Visit 30    PT Start Time 1315    PT Stop Time 1400    PT Time Calculation (min) 45 min    Activity Tolerance Patient tolerated treatment well    Behavior During Therapy WFL for tasks assessed/performed              Past Medical History:  Diagnosis Date   Allergy    to cats and dogs   Diabetes mellitus    Hiatal hernia    and delayed gastric emptying/ sees [facility] for recurrent diarrhea   History of cardiovascular disorder 12/10/2008   Qualifier: Diagnosis of  By: Linford Arnold MD, Catherine     Hypertension    OSA (obstructive sleep apnea)    CPAP   Pedal edema    Spastic bladder    urology in [place]   TIA (transient ischemic attack)    hx of- sees Dr Rayburn Ma (Neurology)   Past Surgical History:  Procedure Laterality Date   CATARACT EXTRACTION  08/2021   IR RADIOLOGIST EVAL & MGMT  03/08/2023   vaginal skin tag removal     Patient Active Problem List   Diagnosis Date Noted   Diabetic retinopathy (HCC) 02/26/2023   Lumbar spondylosis 07/03/2022   Lumbar spinal stenosis 02/26/2022   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/2022   Bilateral hip pain 01/12/2022   Hearing loss due to cerumen impaction, right 01/12/2022   Hammertoe, bilateral 08/23/2021   History of foot ulcer 08/23/2021   Primary osteoarthritis of both knees 12/09/2020   History of burning pain in leg 08/29/2020   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   Tremor 05/05/2020   Pre-ulcerative corn or callous 01/29/2020   History of transient ischemic attack  (TIA) 05/15/2018   Family history of ovarian cancer 04/02/2017   Aortic atherosclerosis (HCC) 08/31/2016   Venous stasis 08/10/2016   Palpitations 07/16/2016   Atrial dilatation, left 07/16/2016   Atrial septal aneurysm 07/16/2016   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   Cataract 04/18/2012   Hyperlipidemia 02/13/2011   Pulmonary nodules 09/22/2010   LEG PAIN 09/12/2009   Sleep apnea 04/13/2009   GERD 03/14/2009   Morbid obesity (HCC) 12/10/2008   Essential hypertension, benign 12/10/2008   RAYNAUD'S DISEASE 12/10/2008   Osteoarthritis 12/10/2008   URINARY INCONTINENCE 12/10/2008    PCP: [name], MD REFERRING PROVIDER: [name], MD REFERRING DIAG:  M17.0 (ICD-10-CM) - Primary osteoarthritis of both knees   THERAPY DIAG:  Muscle weakness (generalized)  Chronic pain of left knee  Chronic pain of right knee  Other abnormalities of gait and mobility  Rationale for Evaluation and Treatment: Rehabilitation  ONSET DATE: 12/17/22  SUBJECTIVE:  SUBJECTIVE STATEMENT: Has been using her massage gun. Pt states her exercises are going well. Reports she has been noticing an improvement in her stamina for standing and decrease in popping.   PERTINENT HISTORY: Diabetes, HTN, TIA, spinal stenosis, polymyalgia rheumatica PAIN:  Are you having pain? Yes: NPRS scale: 5 /10 Pain location: back (hip to hip)  and knees Pain description: "snap, crackle, pop" pain, instability at knees where they give out, and neuropathy in her feet Aggravating factors: trying to move after standing, pivoting to step  Relieving factors: nothing  PRECAUTIONS: Fall  WEIGHT BEARING RESTRICTIONS: No  FALLS:  Has patient fallen in last 6 months? Yes. Number of falls 1  LIVING ENVIRONMENT: Lives with: lives alone spouse passed away 18-Jun-2022 Lives in: House/apartment Stairs: No Has following equipment at home: Quad cane large base and shower bench, grab bars, scooter (uses husband's  scooter)  PATIENT GOALS: "I'm not sure. I don't know how much therapy will help before knee surgery."  Be able to get in and out of her car easier-- right now, it is so slow and a long process.   OBJECTIVE:  (Measures in this section from initial evaluation unless otherwise noted) PATIENT SURVEYS:  FOTO n/a-- mobility focus SENSATION: Notes neuropathy with some burning in feet, dec'd sensation EDEMA:  Bilateral swelling noted in LEs LOWER EXTREMITY ROM: Active ROM Right eval Left eval Right 04/05/23 Left 04/05/23  Hip flexion      Hip extension      Hip abduction      Hip adduction      Hip internal rotation      Hip external rotation      Knee flexion 110 110 111 105  Knee extension 0 0    Ankle dorsiflexion      Ankle plantarflexion      Ankle inversion      Ankle eversion       (Blank rows = not tested) LOWER EXTREMITY MMT: *can lift against gravity MMT Right eval Left eval Right 02/18/23 Left 02/18/23 Right 04/05/23 Left 04/05/23  Hip flexion 3/5 3/5 4+/5 4+/5 4+/5 4+/5  Hip extension        Hip abduction        Hip adduction        Hip internal rotation        Hip external rotation        Knee flexion 3/5 3/5 5/5 5/5    Knee extension 3/5 3/5 5/5 5/5    Ankle dorsiflexion 4/5 4/5      Ankle plantarflexion 3/5 3/5      Ankle inversion        Ankle eversion         (Blank rows = not tested) FUNCTIONAL TESTS:  5 time sit to stand=56.61  Gait speed=0.302 ft/sec *significantly slowed speed* GAIT: Distance walked: with [facility] slowed pace x 75 feet requiring increased time Assistive device utilized: Quad cane large base Level of assistance: Modified independence  slowed pace Comments: patient's quad cane is too high and she switches R and L sides-- PT to address Shoewear: Patient has to use slip on shoes and these slide during gait activities-- PT to address   [facility] Adult PT Treatment:                                                DATE: 04/11/23 Therapeutic  Exercise: Nustep L5 x 5 min UEs/LEs Supine SLR 2x10 2# ankle weight Marching 2x10 with 2# ankle weight Hip abduction 2x10 2# ankle weight Sidelying Hip diagonals 2x10  sit <> stand 2 x 5 Standing Hip ext 2x10    [facility] Adult PT Treatment:  DATE: 04/09/23 Therapeutic Exercise: Supine: windshield wiper legs (hip IR/ER), SLR 2 x 10, single leg circles x 10 bilat CW/CCW Sidelying: straight leg hip abd 2 x 10 Standing : step ups 4'' step x 10 bilat, hip ext diagonals x 10 bilat, sit <> stand 2 x 5   [facility] Adult PT Treatment:                                                DATE: 04/05/2023 Therapeutic Exercise: Hip flexion MMT (see above) Knee flexion measurements (see above) Supine:  SLR 2x10 (B) Single leg circles CW/CCW 2x5 each (B) Side Lying: Straight leg hip abd (leading with heel) 2x10 Small leg circles in hip abd x5 CW/CCW each  Hip abd on diagonal x10, x5 (foot to block in ext)  Timed STS (LTG #4) Gait speed (LTG #2) Manual Therapy: S/L: Roller stick to glutes, lateral thigh -> anterior/lateral quads    PATIENT EDUCATION:  Education details: Updated HEP Person educated: Patient Education method: Explanation, Demonstration, and Handouts Education comprehension: verbalized understanding, returned demonstration, and needs further education  HOME EXERCISE PROGRAM: Access Code: [email or web address] URL: [email or web address] Date: 03/28/2023 Prepared by: Carlynn Herald  Exercises - Supine Active Straight Leg Raise  - 1 x daily - 7 x weekly - 1 sets - 10 reps - Supine Quadriceps Stretch with Strap on Table  - 2 x daily - 7 x weekly - 1 sets - 3 reps - 30-60 sec hold - Sit to Stand with Armchair  - 1 x daily - 7 x weekly - 1 sets - 5 reps - Lateral Step Up with Counter Support  - 1 x daily - 7 x weekly - 3 sets - 10 reps - Forward Step Up with Counter Support  - 1 x daily - 7 x weekly - 3 sets - 10 reps - Side Stepping with  Counter Support  - 1 x daily - 7 x weekly - 3 sets - 10 reps - Forward and Backward Step Over with Counter Support  - 1 x daily - 7 x weekly - 1 sets - 10 reps - Sidelying Hip Abduction  - 1 x daily - 7 x weekly - 2 sets - 10 reps - Sidelying Diagonal Hip Abduction  - 1 x daily - 7 x weekly - 2 sets - 10 reps - Seated Isometric Knee Extension  - 1 x daily - 7 x weekly - 2 sets - 10 reps - 5 sec hold  ASSESSMENT:  CLINICAL IMPRESSION: Session focused on progressive strengthening. Added 2 lb ankle weight today with good pt tolerance. Unable to perform hip abduction diagonals with 2# but able to perform more sets. She continues to make good progress towards her goals.    OBJECTIVE IMPAIRMENTS: Abnormal gait, decreased activity tolerance, decreased balance, decreased mobility, decreased strength, impaired flexibility, obesity, and pain.   GOALS: Goals reviewed with patient? Yes  UPDATED GOALS: SHORT TERM GOALS: Target date: 04/07/23  1.  The patient will report dec'd pain in R leg while driving.  Baseline: Notes pain with being in the car has increased.  Goal status: MET *reports on 03/18/23 that she modified set up using a seat cushion and lumbar support in the car.  LONG TERM GOALS: Target date: 05/07/23  The patient will be indep with progression of HEP. Baseline: doing HEP 1-2x/week Goal status:  IN PROGRESS   2.   The patient will improve gait speed to > or equal to 1.3  ft/sec to demo transition to community ambulation status. Baseline:  1.11 ft/sec  Goal status:  IN PROGRESS  4.  The patient will perform 5 time sit<>stand in < or equal to 20 seconds. Baseline: 19 sec Goal status: MET  5.  The patient will report ability to stand at the kitchen counter x 10 minutes without sensation of not being able to lift her legs after standing. Baseline:  Able to lift leg with less pain Goal status: IN PROGRESS   PLAN:  PT FREQUENCY: 2x/week  PT DURATION: 8 weeks  PLANNED  INTERVENTIONS: Therapeutic exercises, Therapeutic activity, Neuromuscular re-education, Balance training, Gait training, Patient/Family education, Self Care, and Joint mobilization  PLAN FOR NEXT SESSION: Continue LE/core strengthening, progressing standing endurance & gait training as tolerated. Manual (rollerstick) as needed.   Onyekachi Gathright April Ma L Labella Zahradnik, PT 04/11/2023 1:15 PM

## 2023-04-15 ENCOUNTER — Ambulatory Visit: Payer: Medicare Other | Admitting: Rehabilitative and Restorative Service Providers"

## 2023-04-15 ENCOUNTER — Encounter: Payer: Self-pay | Admitting: Rehabilitative and Restorative Service Providers"

## 2023-04-15 DIAGNOSIS — M25562 Pain in left knee: Secondary | ICD-10-CM | POA: Diagnosis not present

## 2023-04-15 DIAGNOSIS — M25561 Pain in right knee: Secondary | ICD-10-CM | POA: Diagnosis not present

## 2023-04-15 DIAGNOSIS — R2689 Other abnormalities of gait and mobility: Secondary | ICD-10-CM | POA: Diagnosis not present

## 2023-04-15 DIAGNOSIS — G8929 Other chronic pain: Secondary | ICD-10-CM | POA: Diagnosis not present

## 2023-04-15 DIAGNOSIS — M6281 Muscle weakness (generalized): Secondary | ICD-10-CM | POA: Diagnosis not present

## 2023-04-15 NOTE — Therapy (Signed)
OUTPATIENT PHYSICAL THERAPY LOWER EXTREMITY TREATMENT   Patient Name: Natalie Wright MRN: [id number] DOB:22-Jan-1951, 72 y.o., female Today's Date: 04/15/2023 Dates of service: 12-31-22 to 04-09-23  PT End of Session - 04/15/23 1156     Visit Number 23    Number of Visits 29    Date for PT Re-Evaluation 05/07/23    Authorization Type medicare and tricare    Progress Note Due on Visit 30    PT Start Time 1155    PT Stop Time 1235    PT Time Calculation (min) 40 min    Activity Tolerance Patient tolerated treatment well    Behavior During Therapy WFL for tasks assessed/performed             Past Medical History:  Diagnosis Date   Allergy    to cats and dogs   Diabetes mellitus    Hiatal hernia    and delayed gastric emptying/ sees [facility] for recurrent diarrhea   History of cardiovascular disorder 12/10/2008   Qualifier: Diagnosis of  By: Linford Arnold MD, Catherine     Hypertension    OSA (obstructive sleep apnea)    CPAP   Pedal edema    Spastic bladder    urology in [place]   TIA (transient ischemic attack)    hx of- sees Dr Rayburn Ma (Neurology)   Past Surgical History:  Procedure Laterality Date   CATARACT EXTRACTION  08/2021   IR RADIOLOGIST EVAL & MGMT  03/08/2023   vaginal skin tag removal     Patient Active Problem List   Diagnosis Date Noted   Diabetic retinopathy (HCC) 02/26/2023   Lumbar spondylosis 07/03/2022   Lumbar spinal stenosis 02/26/2022   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/2022   Bilateral hip pain 01/12/2022   Hearing loss due to cerumen impaction, right 01/12/2022   Hammertoe, bilateral 08/23/2021   History of foot ulcer 08/23/2021   Primary osteoarthritis of both knees 12/09/2020   History of burning pain in leg 08/29/2020   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   Tremor 05/05/2020   Pre-ulcerative corn or callous 01/29/2020   History of transient ischemic attack (TIA) 05/15/2018   Family history of  ovarian cancer 04/02/2017   Aortic atherosclerosis (HCC) 08/31/2016   Venous stasis 08/10/2016   Palpitations 07/16/2016   Atrial dilatation, left 07/16/2016   Atrial septal aneurysm 07/16/2016   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   Cataract 04/18/2012   Hyperlipidemia 02/13/2011   Pulmonary nodules 09/22/2010   LEG PAIN 09/12/2009   Sleep apnea 04/13/2009   GERD 03/14/2009   Morbid obesity (HCC) 12/10/2008   Essential hypertension, benign 12/10/2008   RAYNAUD'S DISEASE 12/10/2008   Osteoarthritis 12/10/2008   URINARY INCONTINENCE 12/10/2008    PCP: [name], MD REFERRING PROVIDER: [name], MD REFERRING DIAG:  M17.0 (ICD-10-CM) - Primary osteoarthritis of both knees   THERAPY DIAG:  Muscle weakness (generalized)  Chronic pain of left knee  Chronic pain of right knee  Other abnormalities of gait and mobility  Rationale for Evaluation and Treatment: Rehabilitation  ONSET DATE: 12/17/22  SUBJECTIVE:  SUBJECTIVE STATEMENT: The patient reports she is sore in the lateral thighs and then from the knees to mid calf. She had 2 episodes of "knee slippage" sensation.   PERTINENT HISTORY: Diabetes, HTN, TIA, spinal stenosis, polymyalgia rheumatica PAIN:  Are you having pain? Yes: NPRS scale: 5 /10 Pain location: back (hip to hip) and knees Pain description: "snap, crackle, pop" pain, instability at knees where  they give out, and neuropathy in her feet Aggravating factors: trying to move after standing, pivoting to step  Relieving factors: nothing  PRECAUTIONS: Fall  WEIGHT BEARING RESTRICTIONS: No  FALLS:  Has patient fallen in last 6 months? Yes. Number of falls 1  PATIENT GOALS: "I'm not sure. I don't know how much therapy will help before knee surgery."  Be able to get in and out of her car easier-- right now, it is so slow and a long process.   OBJECTIVE:  (Measures in this section from initial evaluation unless otherwise noted) PATIENT  SURVEYS:  FOTO n/a-- mobility focus SENSATION: Notes neuropathy with some burning in feet, dec'd sensation EDEMA:  Bilateral swelling noted in LEs LOWER EXTREMITY ROM: Active ROM Right eval Left eval Right 04/05/23 Left 04/05/23  Hip flexion      Hip extension      Hip abduction      Hip adduction      Hip internal rotation      Hip external rotation      Knee flexion 110 110 111 105  Knee extension 0 0    Ankle dorsiflexion      Ankle plantarflexion      Ankle inversion      Ankle eversion       (Blank rows = not tested) LOWER EXTREMITY MMT: *can lift against gravity MMT Right eval Left eval Right 02/18/23 Left 02/18/23 Right 04/05/23 Left 04/05/23  Hip flexion 3/5 3/5 4+/5 4+/5 4+/5 4+/5  Hip extension        Hip abduction        Hip adduction        Hip internal rotation        Hip external rotation        Knee flexion 3/5 3/5 5/5 5/5    Knee extension 3/5 3/5 5/5 5/5    Ankle dorsiflexion 4/5 4/5      Ankle plantarflexion 3/5 3/5      Ankle inversion        Ankle eversion         (Blank rows = not tested) FUNCTIONAL TESTS:  5 time sit to stand=56.61  Gait speed=0.302 ft/sec *significantly slowed speed* GAIT: Distance walked: with [facility] slowed pace x 75 feet requiring increased time Assistive device utilized: Quad cane large base Level of assistance: Modified independence  slowed pace Comments: patient's quad cane is too high and she switches R and L sides-- PT to address Shoewear: Patient has to use slip on shoes and these slide during gait activities-- PT to address   [facility] Adult PT Treatment:                                                DATE: 04/15/23 Therapeutic Exercise: Standing Knee flexion 10 reps R and L alternating Hip extension at counter R and L alternating Supine SLR x 10 reps 2# x 2 sets R and L sides Marching x 2 sets of 10 2# alternating R and L sides Manual Therapy: STM lateral thigh and into calf musculature using muscle roller, glut  med using muscle roller Gait: 180 ft with RW mod indep, 90 ft with RW mod indep   [facility] Adult PT Treatment:  DATE: 04/11/23 Therapeutic Exercise: Nustep L5 x 5 min UEs/LEs Supine SLR 2x10 2# ankle weight Marching 2x10 with 2# ankle weight Hip abduction 2x10 2# ankle weight Sidelying Hip diagonals 2x10  sit <> stand 2 x 5 Standing Hip ext 2x10   [facility] Adult PT Treatment:                                                DATE: 04/09/23 Therapeutic Exercise: Supine: windshield wiper legs (hip IR/ER), SLR 2 x 10, single leg circles x 10 bilat CW/CCW Sidelying: straight leg hip abd 2 x 10 Standing : step ups 4'' step x 10 bilat, hip ext diagonals x 10 bilat, sit <> stand 2 x 5   [facility] Adult PT Treatment:                                                DATE: 04/05/2023 Therapeutic Exercise: Hip flexion MMT (see above) Knee flexion measurements (see above) Supine:  SLR 2x10 (B) Single leg circles CW/CCW 2x5 each (B) Side Lying: Straight leg hip abd (leading with heel) 2x10 Small leg circles in hip abd x5 CW/CCW each  Hip abd on diagonal x10, x5 (foot to block in ext)  Timed STS (LTG #4) Gait speed (LTG #2) Manual Therapy: S/L: Roller stick to glutes, lateral thigh -> anterior/lateral quads    PATIENT EDUCATION:  Education details: Updated HEP Person educated: Patient Education method: Explanation, Demonstration, and Handouts Education comprehension: verbalized understanding, returned demonstration, and needs further education  HOME EXERCISE PROGRAM: Access Code: [email or web address] URL: [email or web address] Date: 03/28/2023 Prepared by: Carlynn Herald  Exercises - Supine Active Straight Leg Raise  - 1 x daily - 7 x weekly - 1 sets - 10 reps - Supine Quadriceps Stretch with Strap on Table  - 2 x daily - 7 x weekly - 1 sets - 3 reps - 30-60 sec hold - Sit to Stand with Armchair  - 1 x daily - 7 x weekly - 1 sets - 5 reps -  Lateral Step Up with Counter Support  - 1 x daily - 7 x weekly - 3 sets - 10 reps - Forward Step Up with Counter Support  - 1 x daily - 7 x weekly - 3 sets - 10 reps - Side Stepping with Counter Support  - 1 x daily - 7 x weekly - 3 sets - 10 reps - Forward and Backward Step Over with Counter Support  - 1 x daily - 7 x weekly - 1 sets - 10 reps - Sidelying Hip Abduction  - 1 x daily - 7 x weekly - 2 sets - 10 reps - Sidelying Diagonal Hip Abduction  - 1 x daily - 7 x weekly - 2 sets - 10 reps - Seated Isometric Knee Extension  - 1 x daily - 7 x weekly - 2 sets - 10 reps - 5 sec hold  ASSESSMENT:  CLINICAL IMPRESSION: The patient reports she can stay in her kitchen to clean up and do everything before needing to rest. She can spend up to an hour cooking reporting her stamina for standing is improving.  She met LTG for gait speed and standing for 10 minutes at countertop with there  ex. PT continuing to progress HEP and mobility. Will determine renewal versus discharge in next 2-3 weeks. Continue progressing strengthening and mobility and working on HEP progression.   OBJECTIVE IMPAIRMENTS: Abnormal gait, decreased activity tolerance, decreased balance, decreased mobility, decreased strength, impaired flexibility, obesity, and pain.   GOALS: Goals reviewed with patient? Yes  UPDATED GOALS: SHORT TERM GOALS: Target date: 04/07/23  1.  The patient will report dec'd pain in R leg while driving.  Baseline: Notes pain with being in the car has increased.  Goal status: MET *reports on 03/18/23 that she modified set up using a seat cushion and lumbar support in the car.  LONG TERM GOALS: Target date: 05/07/23  The patient will be indep with progression of HEP. Baseline: doing HEP 1-2x/week Goal status: IN PROGRESS   2.   The patient will improve gait speed to > or equal to 1.3  ft/sec to demo transition to community ambulation status. Baseline:  1.11 ft/sec  Goal status:  MET-- 1.38 ft/sec on  04/15/23  4.  The patient will perform 5 time sit<>stand in < or equal to 20 seconds. Baseline: 19 sec Goal status: MET  5.  The patient will report ability to stand at the kitchen counter x 10 minutes without sensation of not being able to lift her legs after standing. Baseline:  Able to lift leg with less pain Goal status:MET On 04/15/23   PLAN:  PT FREQUENCY: 2x/week  PT DURATION: 8 weeks  PLANNED INTERVENTIONS: Therapeutic exercises, Therapeutic activity, Neuromuscular re-education, Balance training, Gait training, Patient/Family education, Self Care, and Joint mobilization  PLAN FOR NEXT SESSION: Discussed plan with patient-- she is going to check with MD at procedure re: schedule for other knee and back procedures. May plan to hold until after procedures in order to exercise once pain further addressed. PT planning to continue to work to [employer] goal and update.  *Needs to schedule  Jaz Mallick, PT 04/15/2023 1:05 PM

## 2023-04-18 ENCOUNTER — Ambulatory Visit: Payer: Medicare Other | Admitting: Physical Therapy

## 2023-04-18 ENCOUNTER — Other Ambulatory Visit: Payer: Self-pay | Admitting: Student

## 2023-04-18 ENCOUNTER — Other Ambulatory Visit: Payer: Self-pay | Admitting: Radiology

## 2023-04-18 DIAGNOSIS — G8929 Other chronic pain: Secondary | ICD-10-CM

## 2023-04-18 DIAGNOSIS — M25561 Pain in right knee: Secondary | ICD-10-CM | POA: Diagnosis not present

## 2023-04-18 DIAGNOSIS — M25562 Pain in left knee: Secondary | ICD-10-CM | POA: Diagnosis not present

## 2023-04-18 DIAGNOSIS — R2689 Other abnormalities of gait and mobility: Secondary | ICD-10-CM

## 2023-04-18 DIAGNOSIS — M6281 Muscle weakness (generalized): Secondary | ICD-10-CM | POA: Diagnosis not present

## 2023-04-18 NOTE — H&P (Signed)
Referring Physician(s): Rodney Langton, MD   Supervising Physician: Marliss Coots  Patient Status:  [facility]  Chief Complaint:  Bilateral knee pain, right greater than left  Subjective: Patient known to IR service from consultation with Dr. Elby Showers on 03/08/2023 to discuss treatment options for bilateral knee pain, right greater than left.She is a 72 y.o. female with history of severe bilateral knee pain related to osteoarthritis.  Her pertinent cormorbidities include obesity, diabetes mellitus, hypertension, obstructive sleep apnea, history of TIA.  She describes her knee pain worst when standing. The pain gets significantly worse when initially lifting leg to ambulate.  Her right knee pain is slightly worse than the left.  She is currently in physical therapy.  She has had series of hyaluronic acid injections with Dr. Benjamin Stain with some improvement but pain persists and is lifestyle limiting.  She is not a candidate for arthroplasty due to elevated body mass index.  Following discussion with Dr. Elby Showers she was deemed an appropriate candidate for geniculate artery embolization and presents today for treatment of the more symptomatic right knee. She currently denies fever,HA,CP,dyspnea, cough, abd pain, N/V or bleeding; she does have back pain, polymyalgia, above noted bil knee pain.    Past Medical History:  Diagnosis Date   Allergy    to cats and dogs   Diabetes mellitus    Hiatal hernia    and delayed gastric emptying/ sees [facility] for recurrent diarrhea   History of cardiovascular disorder 12/10/2008   Qualifier: Diagnosis of  By: Linford Arnold MD, Catherine     Hypertension    OSA (obstructive sleep apnea)    CPAP   Pedal edema    Spastic bladder    urology in [place]   TIA (transient ischemic attack)    hx of- sees Dr Rayburn Ma (Neurology)   Past Surgical History:  Procedure Laterality Date   CATARACT EXTRACTION  08/2021   IR RADIOLOGIST EVAL & MGMT  03/08/2023   vaginal  skin tag removal        Allergies: Atorvastatin, Beclomethasone, Cefaclor, Pioglitazone, Pravastatin, Sulfa antibiotics, Cephalexin, Lipitor [atorvastatin calcium], and Statins  Medications: Prior to Admission medications   Medication Sig Start Date End Date Taking? Authorizing Provider  AMBULATORY NON FORMULARY MEDICATION Medication Name: custom fit knee high compression stocking with 15-20 mmHg pressure. Open toe preferred. 08/10/16   Agapito Games, MD  amoxicillin-clavulanate (AUGMENTIN) 875-125 MG tablet Take 1 tablet by mouth 2 (two) times daily. 03/01/23   Agapito Games, MD  aspirin 325 MG tablet Take 325 mg by mouth daily.    [provider]  cetirizine (ZYRTEC) 10 MG tablet Take 10 mg by mouth daily.    [provider]  chlorthalidone (HYGROTON) 25 MG tablet TAKE 1 TABLET EVERY OTHER DAY 03/20/23   Agapito Games, MD  clopidogrel (PLAVIX) 75 MG tablet Take 1 tablet (75 mg total) by mouth daily. 09/10/22   Agapito Games, MD  diltiazem (CARDIZEM CD) 360 MG 24 hr capsule TAKE 1 CAPSULE(360 MG) BY MOUTH DAILY 09/30/17   Agapito Games, MD  Dulaglutide (TRULICITY) 4.5 MG/0.5ML SOPN Inject 4.5 mg as directed once a week. 02/26/23   Agapito Games, MD  DULoxetine (CYMBALTA) 30 MG capsule Take 1 capsule (30 mg total) by mouth daily. For musculoskeletal pain 12/07/22   Agapito Games, MD  Ferrous Sulfate Dried 200 (65 Fe) MG TABS Take 1 tablet by mouth daily.    [provider]  fluconazole (DIFLUCAN)  150 MG tablet TAKE 1 TABLET BY MOUTH ONCE 09/10/22   Agapito Games, MD  insulin glargine (LANTUS) 100 UNIT/ML injection INJECT 35 UNITS UNDER THE SKIN DAILY 09/10/22   Agapito Games, MD  Insulin Pen Needle (NOVOFINE) 30G X 8 MM MISC For use each time injection for insulin 02/10/16   Agapito Games, MD  JARDIANCE 25 MG TABS tablet TAKE 1 TABLET DAILY 04/08/23   Agapito Games, MD  latanoprost (XALATAN)  0.005 % ophthalmic solution 1 drop.    [provider]  lisinopril (ZESTRIL) 20 MG tablet Take 1 tablet (20 mg total) by mouth daily. 01/21/23   Agapito Games, MD  meloxicam (MOBIC) 15 MG tablet Take 1 tablet (15 mg total) by mouth daily as needed. 01/21/23   [name], MD  metFORMIN (GLUCOPHAGE) 1000 MG tablet Take 1 tablet (1,000 mg total) by mouth 2 (two) times daily with a meal. 01/24/23   Agapito Games, MD  Misc. Devices MISC Transfer of care to [facility] [employer] [facility].  Please provide service to her current CPAP machine for OSA at 14 cm. water pressure.  Send directly to [place] [facility] [employer] [facility]. 05/15/19   [provider]  Omega-3 Fatty Acids (FISH OIL) 1200 MG CAPS Take 1 capsule by mouth daily.    [provider]  pantoprazole (PROTONIX) 40 MG tablet Take 1 tablet (40 mg total) by mouth 2 (two) times daily. 01/22/23   Agapito Games, MD  rosuvastatin (CRESTOR) 10 MG tablet TAKE 1 TABLET ONCE A WEEK AT BEDTIME 03/27/22   Agapito Games, MD  traMADol (ULTRAM) 50 MG tablet Take 1 tablet (50 mg total) by mouth every 8 (eight) hours as needed for moderate pain. 10/08/22   [name], MD     Vital Signs:pending    Code Status: FULL CODE  Physical Exam: awake/alert; chest- CTA bilat ant; heart- nl rate, occ ectopy noted; abd-obese, soft,+BS,NT; bilat pretibial edema noted; palpable rt DP pulse,+/- PT pulse  Imaging: No results found.  Labs:  CBC: Recent Labs    07/03/22 1544  WBC 10.1  HGB 14.5  HCT 43.6  PLT 369    COAGS: No results for input(s): "INR", "APTT" in the last 8760 hours.  BMP: Recent Labs    07/03/22 1544 02/26/23 1550  NA 140 139  K 4.4 3.5  CL 98 96  CO2 32 27  GLUCOSE 235* 99  BUN 12 18  CALCIUM 9.4 9.0  CREATININE 0.72 0.77    LIVER FUNCTION TESTS: Recent Labs    07/03/22 1544 02/26/23 1550  BILITOT 0.4 0.3  AST 17 16  ALT 14 13  ALKPHOS  --  91  PROT 7.0 6.5  ALBUMIN  --  4.2     Assessment and Plan: 71 y.o. female with history of severe bilateral knee pain related to osteoarthritis.  Her pertinent cormorbidities include obesity, diabetes mellitus, hypertension, obstructive sleep apnea, history of TIA.  She describes her knee pain worst when standing. The pain gets significantly worse when initially lifting leg to ambulate.  Her right knee pain is slightly worse than the left.  She is currently in physical therapy.  She has had series of hyaluronic acid injections with Dr. Benjamin Stain with some improvement but pain persists and is lifestyle limiting.  She is not a candidate for arthroplasty due to elevated body mass index.  Following recent consultation with Dr. Elby Showers she was deemed an appropriate candidate for geniculate artery embolization and presents today  for treatment of the more symptomatic right knee.Risks and benefits of procedure were discussed with the patient including, but not limited to bleeding, infection, vascular injury or contrast induced renal failure.  This interventional procedure involves the use of X-rays and because of the nature of the planned procedure, it is possible that we will have prolonged use of X-ray fluoroscopy.  Potential radiation risks to you include (but are not limited to) the following: - A slightly elevated risk for cancer  several years later in life. This risk is typically less than 0.5% percent. This risk is low in comparison to the normal incidence of human cancer, which is 33% for women and 50% for men according to the American Cancer Society. - Radiation induced injury can include skin redness, resembling a rash, tissue breakdown / ulcers and hair loss (which can be temporary or permanent).   The likelihood of either of these occurring depends on the difficulty of the procedure and whether you are sensitive to radiation due to previous procedures, disease, or genetic conditions.   IF your procedure requires a prolonged use  of radiation, you will be notified and given written instructions for further action.  It is your responsibility to monitor the irradiated area for the 2 weeks following the procedure and to notify your physician if you are concerned that you have suffered a radiation induced injury.    All of the patient's questions were answered, patient is agreeable to proceed.  Consent signed and in chart.   LABS PENDING   Electronically Signed: D. Jeananne Rama, PA-C 04/18/2023, 2:29 PM   I spent a total of 25 minutes at the the patient's bedside AND on the patient's hospital floor or unit, greater than 50% of which was counseling/coordinating care for right geniculate artery embolization

## 2023-04-18 NOTE — Therapy (Signed)
OUTPATIENT PHYSICAL THERAPY LOWER EXTREMITY TREATMENT   Patient Name: Natalie Wright MRN: [id number] DOB:Mar 20, 1951, 72 y.o., female Today's Date: 04/18/2023 Dates of service: 12-31-22 to 04-09-23    Past Medical History:  Diagnosis Date   Allergy    to cats and dogs   Diabetes mellitus    Hiatal hernia    and delayed gastric emptying/ sees [facility] for recurrent diarrhea   History of cardiovascular disorder 12/10/2008   Qualifier: Diagnosis of  By: Linford Arnold MD, Catherine     Hypertension    OSA (obstructive sleep apnea)    CPAP   Pedal edema    Spastic bladder    urology in [place]   TIA (transient ischemic attack)    hx of- sees Dr Rayburn Ma (Neurology)   Past Surgical History:  Procedure Laterality Date   CATARACT EXTRACTION  08/2021   IR RADIOLOGIST EVAL & MGMT  03/08/2023   vaginal skin tag removal     Patient Active Problem List   Diagnosis Date Noted   Diabetic retinopathy (HCC) 02/26/2023   Lumbar spondylosis 07/03/2022   Lumbar spinal stenosis 02/26/2022   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/2022   Bilateral hip pain 01/12/2022   Hearing loss due to cerumen impaction, right 01/12/2022   Hammertoe, bilateral 08/23/2021   History of foot ulcer 08/23/2021   Primary osteoarthritis of both knees 12/09/2020   History of burning pain in leg 08/29/2020   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   Tremor 05/05/2020   Pre-ulcerative corn or callous 01/29/2020   History of transient ischemic attack (TIA) 05/15/2018   Family history of ovarian cancer 04/02/2017   Aortic atherosclerosis (HCC) 08/31/2016   Venous stasis 08/10/2016   Palpitations 07/16/2016   Atrial dilatation, left 07/16/2016   Atrial septal aneurysm 07/16/2016   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   Cataract 04/18/2012   Hyperlipidemia 02/13/2011   Pulmonary nodules 09/22/2010   LEG PAIN 09/12/2009   Sleep apnea 04/13/2009   GERD 03/14/2009   Morbid obesity (HCC)  12/10/2008   Essential hypertension, benign 12/10/2008   RAYNAUD'S DISEASE 12/10/2008   Osteoarthritis 12/10/2008   URINARY INCONTINENCE 12/10/2008    PCP: [name], MD REFERRING PROVIDER: [name], MD REFERRING DIAG:  M17.0 (ICD-10-CM) - Primary osteoarthritis of both knees   THERAPY DIAG:  No diagnosis found.  Rationale for Evaluation and Treatment: Rehabilitation  ONSET DATE: 12/17/22  SUBJECTIVE:  SUBJECTIVE STATEMENT: Pt will be getting an ablation tomorrow. Will be asking her MD about the plan for continued PT afterward. Feels she has a good HEP and has weights and equipment at home. States she has had 1 instance of her knee "slipping."  PERTINENT HISTORY: Diabetes, HTN, TIA, spinal stenosis, polymyalgia rheumatica PAIN:  Are you having pain? Yes: NPRS scale: 5 /10 Pain location: back (hip to hip) and knees Pain description: "snap, crackle, pop" pain, instability at knees where they give out, and neuropathy in her feet Aggravating factors: trying to move after standing, pivoting to step  Relieving factors: nothing  PRECAUTIONS: Fall  WEIGHT BEARING RESTRICTIONS: No  FALLS:  Has patient fallen in last 6 months? Yes. Number of falls 1  PATIENT GOALS: "I'm not sure. I don't know how much therapy will help before knee surgery."  Be able to get in and out of her car easier-- right now, it is so slow and a long process.   OBJECTIVE:  (Measures in this section from initial evaluation unless otherwise noted) PATIENT SURVEYS:  FOTO  n/a-- mobility focus SENSATION: Notes neuropathy with some burning in feet, dec'd sensation EDEMA:  Bilateral swelling noted in LEs LOWER EXTREMITY ROM: Active ROM Right eval Left eval Right 04/05/23 Left 04/05/23  Hip flexion      Hip extension      Hip abduction      Hip adduction      Hip internal rotation      Hip external rotation      Knee flexion 110 110 111 105  Knee extension 0 0    Ankle  dorsiflexion      Ankle plantarflexion      Ankle inversion      Ankle eversion       (Blank rows = not tested) LOWER EXTREMITY MMT: *can lift against gravity MMT Right eval Left eval Right 02/18/23 Left 02/18/23 Right 04/05/23 Left 04/05/23  Hip flexion 3/5 3/5 4+/5 4+/5 4+/5 4+/5  Hip extension        Hip abduction        Hip adduction        Hip internal rotation        Hip external rotation        Knee flexion 3/5 3/5 5/5 5/5    Knee extension 3/5 3/5 5/5 5/5    Ankle dorsiflexion 4/5 4/5      Ankle plantarflexion 3/5 3/5      Ankle inversion        Ankle eversion         (Blank rows = not tested) FUNCTIONAL TESTS:  5 time sit to stand=56.61  Gait speed=0.302 ft/sec *significantly slowed speed* GAIT: Distance walked: with [facility] slowed pace x 75 feet requiring increased time Assistive device utilized: Quad cane large base Level of assistance: Modified independence  slowed pace Comments: patient's quad cane is too high and she switches R and L sides-- PT to address Shoewear: Patient has to use slip on shoes and these slide during gait activities-- PT to address  [facility] Adult PT Treatment:                                                DATE: 04/18/23 Therapeutic Exercise: Supine SAQ 3# 2x10 Hip flexion 3# 2x10 Standing Hip abd 3# 2x10 Hip ext 3# 2x10 Sit<>stand x5 Ambulating x 2 laps working on hip extension and increasing trunk extension   [facility] Adult PT Treatment:                                                DATE: 04/15/23 Therapeutic Exercise: Standing Knee flexion 10 reps R and L alternating Hip extension at counter R and L alternating Supine SLR x 10 reps 2# x 2 sets R and L sides Marching x 2 sets of 10 2# alternating R and L sides Manual Therapy: STM lateral thigh and into calf musculature using muscle roller, glut med using muscle roller Gait: 180 ft with RW mod indep, 90 ft with RW mod indep   [facility] Adult PT Treatment:  DATE: 04/11/23 Therapeutic Exercise: Nustep L5 x 5 min UEs/LEs Supine SLR 2x10 2# ankle weight Marching 2x10 with 2# ankle weight Hip abduction 2x10 2# ankle weight Sidelying Hip diagonals 2x10  sit <> stand 2 x 5 Standing Hip ext 2x10   [facility] Adult PT Treatment:                                                DATE: 04/09/23 Therapeutic Exercise: Supine: windshield wiper legs (hip IR/ER), SLR 2 x 10, single leg circles x 10 bilat CW/CCW Sidelying: straight leg hip abd 2 x 10 Standing : step ups 4'' step x 10 bilat, hip ext diagonals x 10 bilat, sit <> stand 2 x 5    PATIENT EDUCATION:  Education details: Updated HEP Person educated: Patient Education method: Explanation, Demonstration, and Handouts Education comprehension: verbalized understanding, returned demonstration, and needs further education  HOME EXERCISE PROGRAM: Access Code: [email or web address] URL: [email or web address] Date: 03/28/2023 Prepared by: Carlynn Herald  Exercises - Supine Active Straight Leg Raise  - 1 x daily - 7 x weekly - 1 sets - 10 reps - Supine Quadriceps Stretch with Strap on Table  - 2 x daily - 7 x weekly - 1 sets - 3 reps - 30-60 sec hold - Sit to Stand with Armchair  - 1 x daily - 7 x weekly - 1 sets - 5 reps - Lateral Step Up with Counter Support  - 1 x daily - 7 x weekly - 3 sets - 10 reps - Forward Step Up with Counter Support  - 1 x daily - 7 x weekly - 3 sets - 10 reps - Side Stepping with Counter Support  - 1 x daily - 7 x weekly - 3 sets - 10 reps - Forward and Backward Step Over with Counter Support  - 1 x daily - 7 x weekly - 1 sets - 10 reps - Sidelying Hip Abduction  - 1 x daily - 7 x weekly - 2 sets - 10 reps - Sidelying Diagonal Hip Abduction  - 1 x daily - 7 x weekly - 2 sets - 10 reps - Seated Isometric Knee Extension  - 1 x daily - 7 x weekly - 2 sets - 10 reps - 5 sec hold  ASSESSMENT:  CLINICAL IMPRESSION: Pt may potentially be placed on hold after her  procedure if she is not cleared for therapy. Continued to progress her hip and knee strengthening with weights today. Pt has been meeting her goals and continues to demo good effort in therapy  OBJECTIVE IMPAIRMENTS: Abnormal gait, decreased activity tolerance, decreased balance, decreased mobility, decreased strength, impaired flexibility, obesity, and pain.   GOALS: Goals reviewed with patient? Yes  UPDATED GOALS: SHORT TERM GOALS: Target date: 04/07/23  1.  The patient will report dec'd pain in R leg while driving.  Baseline: Notes pain with being in the car has increased.  Goal status: MET *reports on 03/18/23 that she modified set up using a seat cushion and lumbar support in the car.  LONG TERM GOALS: Target date: 05/07/23  The patient will be indep with progression of HEP. Baseline: doing HEP 1-2x/week Goal status: IN PROGRESS   2.   The patient will improve gait speed to > or equal to 1.3  ft/sec to demo transition to community ambulation status. Baseline:  1.11 ft/sec  Goal status:  MET-- 1.38 ft/sec on 04/15/23  4.  The patient will perform 5 time sit<>stand in < or equal to 20 seconds. Baseline: 19 sec Goal status: MET  5.  The patient will report ability to stand at the kitchen counter x 10 minutes without sensation of not being able to lift her legs after standing. Baseline:  Able to lift leg with less pain Goal status:MET On 04/15/23   PLAN:  PT FREQUENCY: 2x/week  PT DURATION: 8 weeks  PLANNED INTERVENTIONS: Therapeutic exercises, Therapeutic activity, Neuromuscular re-education, Balance training, Gait training, Patient/Family education, Self Care, and Joint mobilization  PLAN FOR NEXT SESSION: Discussed plan with patient-- she is going to check with MD at procedure re: schedule for other knee and back procedures. May plan to hold until after procedures in order to exercise once pain further addressed. PT planning to continue to work to [employer] goal and update.    Jacey Eckerson April Ma L Toshie Demelo, PT 04/18/2023 8:00 AM

## 2023-04-19 ENCOUNTER — Ambulatory Visit (HOSPITAL_COMMUNITY)
Admission: RE | Admit: 2023-04-19 | Discharge: 2023-04-19 | Disposition: A | Discharge status: Home / Self Care | Payer: Medicare Other | Source: Ambulatory Visit | Attending: Interventional Radiology | Admitting: Interventional Radiology

## 2023-04-19 ENCOUNTER — Other Ambulatory Visit: Payer: Self-pay

## 2023-04-19 ENCOUNTER — Encounter (HOSPITAL_COMMUNITY): Payer: Self-pay

## 2023-04-19 ENCOUNTER — Other Ambulatory Visit (HOSPITAL_COMMUNITY): Payer: Self-pay | Admitting: Interventional Radiology

## 2023-04-19 ENCOUNTER — Ambulatory Visit (HOSPITAL_COMMUNITY)
Admission: RE | Admit: 2023-04-19 | Discharge: 2023-04-19 | Disposition: A | Discharge status: Home / Self Care | Payer: Medicare Other | Source: Ambulatory Visit | Attending: Interventional Radiology

## 2023-04-19 DIAGNOSIS — Z7985 Long-term (current) use of injectable non-insulin antidiabetic drugs: Secondary | ICD-10-CM | POA: Diagnosis not present

## 2023-04-19 DIAGNOSIS — Z7984 Long term (current) use of oral hypoglycemic drugs: Secondary | ICD-10-CM | POA: Diagnosis not present

## 2023-04-19 DIAGNOSIS — Z794 Long term (current) use of insulin: Secondary | ICD-10-CM | POA: Diagnosis not present

## 2023-04-19 DIAGNOSIS — I10 Essential (primary) hypertension: Secondary | ICD-10-CM | POA: Diagnosis not present

## 2023-04-19 DIAGNOSIS — M1711 Unilateral primary osteoarthritis, right knee: Secondary | ICD-10-CM

## 2023-04-19 DIAGNOSIS — Z8673 Personal history of transient ischemic attack (TIA), and cerebral infarction without residual deficits: Secondary | ICD-10-CM | POA: Diagnosis not present

## 2023-04-19 DIAGNOSIS — E669 Obesity, unspecified: Secondary | ICD-10-CM | POA: Diagnosis not present

## 2023-04-19 DIAGNOSIS — M25561 Pain in right knee: Secondary | ICD-10-CM

## 2023-04-19 DIAGNOSIS — G4733 Obstructive sleep apnea (adult) (pediatric): Secondary | ICD-10-CM | POA: Diagnosis not present

## 2023-04-19 DIAGNOSIS — Z6841 Body Mass Index (BMI) 40.0 and over, adult: Secondary | ICD-10-CM | POA: Insufficient documentation

## 2023-04-19 DIAGNOSIS — E119 Type 2 diabetes mellitus without complications: Secondary | ICD-10-CM | POA: Diagnosis not present

## 2023-04-19 DIAGNOSIS — M170 Bilateral primary osteoarthritis of knee: Secondary | ICD-10-CM | POA: Diagnosis not present

## 2023-04-19 HISTORY — PX: IR ANGIOGRAM SELECTIVE EACH ADDITIONAL VESSEL: IMG667

## 2023-04-19 HISTORY — PX: IR EMBO ARTERIAL NOT HEMORR HEMANG INC GUIDE ROADMAPPING: IMG5448

## 2023-04-19 HISTORY — PX: IR US GUIDE VASC ACCESS RIGHT: IMG2390

## 2023-04-19 LAB — CBC WITH DIFFERENTIAL/PLATELET
Abs Immature Granulocytes: 0.03 10*3/uL (ref 0.00–0.07)
Basophils Absolute: 0 10*3/uL (ref 0.0–0.1)
Basophils Relative: 0 %
Eosinophils Absolute: 0.2 10*3/uL (ref 0.0–0.5)
Eosinophils Relative: 2 %
HCT: 47.9 % — ABNORMAL HIGH (ref 36.0–46.0)
Hemoglobin: 15.5 g/dL — ABNORMAL HIGH (ref 12.0–15.0)
Immature Granulocytes: 0 %
Lymphocytes Relative: 17 %
Lymphs Abs: 1.6 10*3/uL (ref 0.7–4.0)
MCH: 29.1 pg (ref 26.0–34.0)
MCHC: 32.4 g/dL (ref 30.0–36.0)
MCV: 90 fL (ref 80.0–100.0)
Monocytes Absolute: 0.4 10*3/uL (ref 0.1–1.0)
Monocytes Relative: 5 %
Neutro Abs: 7.2 10*3/uL (ref 1.7–7.7)
Neutrophils Relative %: 76 %
Platelets: 317 10*3/uL (ref 150–400)
RBC: 5.32 MIL/uL — ABNORMAL HIGH (ref 3.87–5.11)
RDW: 13.7 % (ref 11.5–15.5)
WBC: 9.5 10*3/uL (ref 4.0–10.5)
nRBC: 0 % (ref 0.0–0.2)

## 2023-04-19 LAB — BASIC METABOLIC PANEL
Anion gap: 13 (ref 5–15)
BUN: 19 mg/dL (ref 8–23)
CO2: 27 mmol/L (ref 22–32)
Calcium: 9.3 mg/dL (ref 8.9–10.3)
Chloride: 97 mmol/L — ABNORMAL LOW (ref 98–111)
Creatinine, Ser: 0.74 mg/dL (ref 0.44–1.00)
GFR, Estimated: >60 mL/min (ref >60)
Glucose, Bld: 184 mg/dL — ABNORMAL HIGH (ref 70–99)
Potassium: 3.3 mmol/L — ABNORMAL LOW (ref 3.5–5.1)
Sodium: 137 mmol/L (ref 135–145)

## 2023-04-19 LAB — GLUCOSE, CAPILLARY: Glucose-Capillary: 174 mg/dL — ABNORMAL HIGH (ref 70–99)

## 2023-04-19 LAB — PROTIME-INR
INR: 1 (ref 0.8–1.2)
Prothrombin Time: 13.3 s (ref 11.4–15.2)

## 2023-04-19 MED ORDER — IODIXANOL 320 MG/ML IV SOLN
50.0000 mL | Freq: Once | INTRAVENOUS | Status: AC | PRN
  Administered 2023-04-19: 10 mL via INTRA_ARTERIAL

## 2023-04-19 MED ORDER — SODIUM CHLORIDE 0.9% FLUSH: 10.0000 mL | Freq: Two times a day (BID) | INTRAVENOUS | Status: DC

## 2023-04-19 MED ORDER — FENTANYL CITRATE (PF) 100 MCG/2ML IJ SOLN
INTRAMUSCULAR | Status: AC
  Filled 2023-04-19: qty 2

## 2023-04-19 MED ORDER — MIDAZOLAM HCL 2 MG/2ML IJ SOLN
INTRAMUSCULAR | Status: AC | PRN
Start: 2023-04-19 — End: 2023-04-19
  Administered 2023-04-19 (×3): 1 mg via INTRAVENOUS

## 2023-04-19 MED ORDER — NITROGLYCERIN 1 MG/10 ML FOR IR/CATH LAB
INTRA_ARTERIAL | Status: AC | PRN
  Administered 2023-04-19: 100 ug

## 2023-04-19 MED ORDER — MIDAZOLAM HCL 2 MG/2ML IJ SOLN
INTRAMUSCULAR | Status: AC
  Filled 2023-04-19: qty 4

## 2023-04-19 MED ORDER — NITROGLYCERIN IN D5W 100-5 MCG/ML-% IV SOLN
INTRAVENOUS | Status: AC
  Filled 2023-04-19: qty 250

## 2023-04-19 MED ORDER — LIDOCAINE-EPINEPHRINE 1 %-1:100000 IJ SOLN
20.0000 mL | Freq: Once | INTRAMUSCULAR | Status: AC
  Administered 2023-04-19: 2 mL via INTRADERMAL

## 2023-04-19 MED ORDER — KETOROLAC TROMETHAMINE 30 MG/ML IJ SOLN
INTRAMUSCULAR | Status: AC
  Filled 2023-04-19: qty 1

## 2023-04-19 MED ORDER — KETOROLAC TROMETHAMINE 30 MG/ML IJ SOLN
30.0000 mg | Freq: Once | INTRAMUSCULAR | Status: AC
  Administered 2023-04-19: 30 mg via INTRAVENOUS

## 2023-04-19 MED ORDER — FENTANYL CITRATE (PF) 100 MCG/2ML IJ SOLN
INTRAMUSCULAR | Status: AC | PRN
Start: 2023-04-19 — End: 2023-04-19
  Administered 2023-04-19 (×2): 50 ug via INTRAVENOUS

## 2023-04-19 MED ORDER — LIDOCAINE HCL 1 % IJ SOLN
INTRAMUSCULAR | Status: AC
  Filled 2023-04-19: qty 20

## 2023-04-19 NOTE — Procedures (Signed)
Interventional Radiology Procedure Note  Procedure: Right geniculate artery embolization.  Findings: Please refer to procedural dictation for full description. Right posterior tibial artery 4 Fr access. 0.6 mL 100-300 micron embolization of lateral articular branch of descending geniculate artery.  No significant synovial hyperemia from medial branch, inferior lateral geniculate artery, or other geniculate arteries.  Complications: None immediate  Estimated Blood Loss:  < 5 mL  Recommendations: 2 hours bedrest IR will arrange 1 month out patient follow up   Marliss Coots, MD

## 2023-04-19 NOTE — Discharge Instructions (Signed)
Please call Interventional Radiology clinic [phone] with any questions or concerns.  You may remove your dressing and shower tomorrow.  After the procedure, it is common to have: Pain or discomfort at the incision site Low-grade fever (temperature less than 101.5 degrees Fahrenheit or 38.6 degrees Celsius) for 4-7 days Tenderness and/or bruising around the puncture site. Bruising can take 2-3 weeks to go away completely. Please call immediately if you feel a lump that is growing or varies with your pulse  Medication: Do not use Aspirin or ibuprofen products, such as Advil or Motrin, as it may increase bleeding You may resume your usual medications as ordered by your doctor If your doctor prescribed antibiotics, take them as directed. Do not stop taking them just because you feel better  Eating and drinking: Drink plenty of liquids to keep your urine pale yellow You can resume your regular diet as directed by your doctor   Activity For procedures where we entered your artery from the top of your leg (groin):  Avoid strenuous activity, such as climbing long flights of stairs, for 24 hours after your procedure  No heavy lifting (greater than 15-20 pounds or 6-9 kg) for 7 days or until the puncture site heels Do not take baths, swim, or use a hot tub until your health care provider approves. Take showers only Keep all follow-up visits as told by your doctor  Care of the procedure site Follow instructions from your health care provider about how to take care of the puncture site.  Wash your hands with soap and water before you change your bandage (dressing). If soap and water are not available, use hand sanitizer Change your dressing as told by your health care provider Leave stitches (sutures), skin glue, or adhesive strips in place. These skin closures may need to stay in place for 2 weeks or longer. If adhesive strip edges start to loosen and curl up, you may trim the loose edges. Do  not remove adhesive strips completely unless your health care provider tells you to do that Check your puncture site every day for signs of infection. Check for: More redness, swelling, or pain Warmth/heat at puncture site Pus or a bad odor  Contact a health care provider if: You have a fever You have more redness, swelling, or pain around your incision You have more fluid or blood coming from your incision site Your incision feels warm to the touch You have pus or a bad smell coming from your incision You have a rash You have nausea, or you cannot eat or drink anything without vomiting  Get help right away if: You have trouble breathing You have chest pain You have severe pain in your abdomen, and it does not get better with medicine You have leg pain or leg swelling You feel dizzy, or you faint    Moderate Conscious Sedation-Care After  This sheet gives you information about how to care for yourself after your procedure. Your health care provider may also give you more specific instructions. If you have problems or questions, contact your health care provider.  After the procedure, it is common to have: Sleepiness for several hours. Impaired judgment for several hours. Difficulty with balance. Vomiting if you eat too soon.  Follow these instructions at home:  Rest. Do not participate in activities where you could fall or become injured. Do not drive or use machinery. Do not drink alcohol. Do not take sleeping pills or medicines that cause drowsiness. Do not make  important decisions or sign legal documents. Do not take care of children on your own.  Eating and drinking Follow the diet recommended by your health care provider. Drink enough fluid to keep your urine pale yellow. If you vomit: Drink water, juice, or soup when you can drink without vomiting. Make sure you have little or no nausea before eating solid foods.  General instructions Take over-the-counter  and prescription medicines only as told by your health care provider. Have a responsible adult stay with you for the time you are told. It is important to have someone help care for you until you are awake and alert. Do not smoke. Keep all follow-up visits as told by your health care provider. This is important.  Contact a health care provider if: You are still sleepy or having trouble with balance after 24 hours. You feel light-headed. You keep feeling nauseous or you keep vomiting. You develop a rash. You have a fever. You have redness or swelling around the IV site.  Get help right away if: You have trouble breathing. You have new-onset confusion at home.  This information is not intended to replace advice given to you by your health care provider. Make sure you discuss any questions you have with your healthcare provider.

## 2023-04-22 ENCOUNTER — Encounter (HOSPITAL_COMMUNITY): Payer: Self-pay | Admitting: Interventional Radiology

## 2023-04-22 ENCOUNTER — Other Ambulatory Visit (HOSPITAL_COMMUNITY): Payer: Self-pay | Admitting: Interventional Radiology

## 2023-04-22 DIAGNOSIS — M1711 Unilateral primary osteoarthritis, right knee: Secondary | ICD-10-CM

## 2023-04-23 ENCOUNTER — Other Ambulatory Visit: Payer: Self-pay | Admitting: Interventional Radiology

## 2023-04-23 DIAGNOSIS — M1711 Unilateral primary osteoarthritis, right knee: Secondary | ICD-10-CM

## 2023-04-23 DIAGNOSIS — E113213 Type 2 diabetes mellitus with mild nonproliferative diabetic retinopathy with macular edema, bilateral: Secondary | ICD-10-CM | POA: Diagnosis not present

## 2023-04-23 DIAGNOSIS — H2512 Age-related nuclear cataract, left eye: Secondary | ICD-10-CM | POA: Diagnosis not present

## 2023-04-23 DIAGNOSIS — H43811 Vitreous degeneration, right eye: Secondary | ICD-10-CM | POA: Diagnosis not present

## 2023-04-23 DIAGNOSIS — H20021 Recurrent acute iridocyclitis, right eye: Secondary | ICD-10-CM | POA: Diagnosis not present

## 2023-04-29 ENCOUNTER — Ambulatory Visit: Payer: Medicare Other

## 2023-04-29 DIAGNOSIS — G8929 Other chronic pain: Secondary | ICD-10-CM | POA: Diagnosis not present

## 2023-04-29 DIAGNOSIS — M6281 Muscle weakness (generalized): Secondary | ICD-10-CM

## 2023-04-29 DIAGNOSIS — M25561 Pain in right knee: Secondary | ICD-10-CM | POA: Diagnosis not present

## 2023-04-29 DIAGNOSIS — M25562 Pain in left knee: Secondary | ICD-10-CM | POA: Diagnosis not present

## 2023-04-29 DIAGNOSIS — R2689 Other abnormalities of gait and mobility: Secondary | ICD-10-CM | POA: Diagnosis not present

## 2023-04-29 NOTE — Therapy (Signed)
OUTPATIENT PHYSICAL THERAPY LOWER EXTREMITY TREATMENT   Patient Name: Natalie Wright MRN: [id number] DOB:1950-12-14, 72 y.o., female Today's Date: 04/29/2023 Dates of service: 12-31-22 to 04-09-23  PT End of Session - 04/29/23 1104     Visit Number 25    Number of Visits 29    Date for PT Re-Evaluation 05/07/23    Authorization Type medicare and tricare    Progress Note Due on Visit 30    PT Start Time 1102    PT Stop Time 1144    PT Time Calculation (min) 42 min    Activity Tolerance Patient tolerated treatment well    Behavior During Therapy WFL for tasks assessed/performed            Past Medical History:  Diagnosis Date   Allergy    to cats and dogs   Diabetes mellitus    Hiatal hernia    and delayed gastric emptying/ sees [facility] for recurrent diarrhea   History of cardiovascular disorder 12/10/2008   Qualifier: Diagnosis of  By: Linford Arnold MD, Catherine     Hypertension    OSA (obstructive sleep apnea)    CPAP   Pedal edema    Spastic bladder    urology in [place]   TIA (transient ischemic attack)    hx of- sees Dr Rayburn Ma (Neurology)   Past Surgical History:  Procedure Laterality Date   CATARACT EXTRACTION  08/2021   IR ANGIOGRAM SELECTIVE EACH ADDITIONAL VESSEL  04/19/2023   IR ANGIOGRAM SELECTIVE EACH ADDITIONAL VESSEL  04/19/2023   IR ANGIOGRAM SELECTIVE EACH ADDITIONAL VESSEL  04/19/2023   IR EMBO ARTERIAL NOT HEMORR HEMANG INC GUIDE ROADMAPPING  04/19/2023   IR RADIOLOGIST EVAL & MGMT  03/08/2023   IR US GUIDE VASC ACCESS RIGHT  04/19/2023   vaginal skin tag removal     Patient Active Problem List   Diagnosis Date Noted   Diabetic retinopathy (HCC) 02/26/2023   Lumbar spondylosis 07/03/2022   Lumbar spinal stenosis 02/26/2022   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/2022   Bilateral hip pain 01/12/2022   Hearing loss due to cerumen impaction, right 01/12/2022   Hammertoe, bilateral 08/23/2021   History of foot ulcer 08/23/2021   Primary  osteoarthritis of both knees 12/09/2020   History of burning pain in leg 08/29/2020   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   Tremor 05/05/2020   Pre-ulcerative corn or callous 01/29/2020   History of transient ischemic attack (TIA) 05/15/2018   Family history of ovarian cancer 04/02/2017   Aortic atherosclerosis (HCC) 08/31/2016   Venous stasis 08/10/2016   Palpitations 07/16/2016   Atrial dilatation, left 07/16/2016   Atrial septal aneurysm 07/16/2016   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   Cataract 04/18/2012   Hyperlipidemia 02/13/2011   Pulmonary nodules 09/22/2010   LEG PAIN 09/12/2009   Sleep apnea 04/13/2009   GERD 03/14/2009   Morbid obesity (HCC) 12/10/2008   Essential hypertension, benign 12/10/2008   RAYNAUD'S DISEASE 12/10/2008   Osteoarthritis 12/10/2008   URINARY INCONTINENCE 12/10/2008    PCP: [name], MD REFERRING PROVIDER: [name], MD REFERRING DIAG:  M17.0 (ICD-10-CM) - Primary osteoarthritis of both knees   THERAPY DIAG:  Muscle weakness (generalized)  Chronic pain of left knee  Chronic pain of right knee  Other abnormalities of gait and mobility  Rationale for Evaluation and Treatment: Rehabilitation  ONSET DATE: 12/17/22  SUBJECTIVE:  SUBJECTIVE STATEMENT: Patient reports she can tell a difference in her knee pain since the R  knee ablation; states she sometimes get medial knee pain that "comes and goes". Patient states she plans on scheduling L knee procedure. Patient states she still gets "slipping" sensation in bilateral knees (L>R), however has less "popping" in bilateral knees (R better than L). Patient states she has phone follow-up for L knee procedure on 05/20/23. Patient states MD told her she has no other precautions other than no heavy lifting.  PERTINENT HISTORY: Diabetes, HTN, TIA, spinal stenosis, polymyalgia rheumatica PAIN:  Are you having pain? Yes: NPRS scale: 5 /10 Pain  location: back (hip to hip) and knees Pain description: "snap, crackle, pop" pain, instability at knees where they give out, and neuropathy in her feet Aggravating factors: trying to move after standing, pivoting to step  Relieving factors: nothing  PRECAUTIONS: Fall  WEIGHT BEARING RESTRICTIONS: No  FALLS:  Has patient fallen in last 6 months? Yes. Number of falls 1  PATIENT GOALS: "I'm not sure. I don't know how much therapy will help before knee surgery."  Be able to get in and out of her car easier-- right now, it is so slow and a long process.   OBJECTIVE:  (Measures in this section from initial evaluation unless otherwise noted) PATIENT SURVEYS:  FOTO n/a-- mobility focus SENSATION: Notes neuropathy with some burning in feet, dec'd sensation EDEMA:  Bilateral swelling noted in LEs  LOWER EXTREMITY ROM: Active ROM Right eval Left eval Right 04/05/23 Left 04/05/23  Hip flexion      Hip extension      Hip abduction      Hip adduction      Hip internal rotation      Hip external rotation      Knee flexion 110 110 111 105  Knee extension 0 0    Ankle dorsiflexion      Ankle plantarflexion      Ankle inversion      Ankle eversion       (Blank rows = not tested)  LOWER EXTREMITY MMT: *can lift against gravity MMT Right eval Left eval Right 02/18/23 Left 02/18/23 Right 04/05/23 Left 04/05/23  Hip flexion 3/5 3/5 4+/5 4+/5 4+/5 4+/5  Hip extension        Hip abduction        Hip adduction        Hip internal rotation        Hip external rotation        Knee flexion 3/5 3/5 5/5 5/5    Knee extension 3/5 3/5 5/5 5/5    Ankle dorsiflexion 4/5 4/5      Ankle plantarflexion 3/5 3/5      Ankle inversion        Ankle eversion         (Blank rows = not tested) FUNCTIONAL TESTS:  5 time sit to stand=56.61  Gait speed=0.302 ft/sec *significantly slowed speed* GAIT: Distance walked: with [facility] slowed pace x 75 feet requiring increased time Assistive device utilized:  Quad cane large base Level of assistance: Modified independence  slowed pace Comments: patient's quad cane is too high and she switches R and L sides-- PT to address Shoewear: Patient has to use slip on shoes and these slide during gait activities-- PT to address  [facility] Adult PT Treatment:  DATE: 04/29/2023 Therapeutic Exercise: Side Lying:  Straight leg hip abd x15 Fwd/Bkwd leg kicks x10 Hip extension on diagonal 2x10 Supine: AROM heel slides x10  SAQ R 0#, L 3#AW  2x10 Marching x20 STS from corner of mat table x10 (no HHA) 4" step --> step up/down x5 leading each leg Standing hip extension --> discontinued d/t knee pain ("popping") Manual Therapy: Roller stick glutes, lateral quads, ITB    [facility] Adult PT Treatment:                                                DATE: 04/18/23 Therapeutic Exercise: Supine SAQ 3# 2x10 Hip flexion 3# 2x10 Standing Hip abd 3# 2x10 Hip ext 3# 2x10 Sit<>stand x5 Ambulating x 2 laps working on hip extension and increasing trunk extension    [facility] Adult PT Treatment:                                                DATE: 04/15/23 Therapeutic Exercise: Standing Knee flexion 10 reps R and L alternating Hip extension at counter R and L alternating Supine SLR x 10 reps 2# x 2 sets R and L sides Marching x 2 sets of 10 2# alternating R and L sides Manual Therapy: STM lateral thigh and into calf musculature using muscle roller, glut med using muscle roller Gait: 180 ft with RW mod indep, 90 ft with RW mod indep    PATIENT EDUCATION:  Education details: Updated HEP Person educated: Patient Education method: Programmer, multimedia, Demonstration, and Handouts Education comprehension: verbalized understanding, returned demonstration, and needs further education  HOME EXERCISE PROGRAM: Access Code: [email or web address] URL: [email or web address] Date: 03/28/2023 Prepared by: Carlynn Herald  Exercises -  Supine Active Straight Leg Raise  - 1 x daily - 7 x weekly - 1 sets - 10 reps - Supine Quadriceps Stretch with Strap on Table  - 2 x daily - 7 x weekly - 1 sets - 3 reps - 30-60 sec hold - Sit to Stand with Armchair  - 1 x daily - 7 x weekly - 1 sets - 5 reps - Lateral Step Up with Counter Support  - 1 x daily - 7 x weekly - 3 sets - 10 reps - Forward Step Up with Counter Support  - 1 x daily - 7 x weekly - 3 sets - 10 reps - Side Stepping with Counter Support  - 1 x daily - 7 x weekly - 3 sets - 10 reps - Forward and Backward Step Over with Counter Support  - 1 x daily - 7 x weekly - 1 sets - 10 reps - Sidelying Hip Abduction  - 1 x daily - 7 x weekly - 2 sets - 10 reps - Sidelying Diagonal Hip Abduction  - 1 x daily - 7 x weekly - 2 sets - 10 reps - Seated Isometric Knee Extension  - 1 x daily - 7 x weekly - 2 sets - 10 reps - 5 sec hold  ASSESSMENT:  CLINICAL IMPRESSION: Quad and hip strengthening exercises continued; resistance deferred with R knee due to recent ablation procedure. Increased pain in standing leg during standing hip extension (L>R); patient reported "slipping" sensation.  OBJECTIVE IMPAIRMENTS: Abnormal gait, decreased activity  tolerance, decreased balance, decreased mobility, decreased strength, impaired flexibility, obesity, and pain.   GOALS: Goals reviewed with patient? Yes  UPDATED GOALS: SHORT TERM GOALS: Target date: 04/07/23  1.  The patient will report dec'd pain in R leg while driving.  Baseline: Notes pain with being in the car has increased.  Goal status: MET *reports on 03/18/23 that she modified set up using a seat cushion and lumbar support in the car.  LONG TERM GOALS: Target date: 05/07/23  The patient will be indep with progression of HEP. Baseline: doing HEP 1-2x/week Goal status: IN PROGRESS   2.   The patient will improve gait speed to > or equal to 1.3  ft/sec to demo transition to community ambulation status. Baseline:  1.11 ft/sec  Goal  status:  MET-- 1.38 ft/sec on 04/15/23  4.  The patient will perform 5 time sit<>stand in < or equal to 20 seconds. Baseline: 19 sec Goal status: MET  5.  The patient will report ability to stand at the kitchen counter x 10 minutes without sensation of not being able to lift her legs after standing. Baseline:  Able to lift leg with less pain Goal status:MET On 04/15/23   PLAN:  PT FREQUENCY: 2x/week  PT DURATION: 8 weeks  PLANNED INTERVENTIONS: Therapeutic exercises, Therapeutic activity, Neuromuscular re-education, Balance training, Gait training, Patient/Family education, Self Care, and Joint mobilization  PLAN FOR NEXT SESSION: Continue working towards LTGs in POC  [facility], PTA 04/29/2023 11:44 AM

## 2023-05-01 ENCOUNTER — Encounter: Payer: Self-pay | Admitting: Sports Medicine

## 2023-05-02 ENCOUNTER — Ambulatory Visit: Payer: Medicare Other

## 2023-05-02 DIAGNOSIS — M6281 Muscle weakness (generalized): Secondary | ICD-10-CM | POA: Diagnosis not present

## 2023-05-02 DIAGNOSIS — M25561 Pain in right knee: Secondary | ICD-10-CM | POA: Diagnosis not present

## 2023-05-02 DIAGNOSIS — G8929 Other chronic pain: Secondary | ICD-10-CM

## 2023-05-02 DIAGNOSIS — R2689 Other abnormalities of gait and mobility: Secondary | ICD-10-CM

## 2023-05-02 DIAGNOSIS — M25562 Pain in left knee: Secondary | ICD-10-CM | POA: Diagnosis not present

## 2023-05-02 NOTE — Therapy (Signed)
OUTPATIENT PHYSICAL THERAPY LOWER EXTREMITY TREATMENT   Patient Name: Natalie Wright MRN: [id number] DOB:1950/09/22, 72 y.o., female Today's Date: 05/02/2023 Dates of service: 12-31-22 to 04-09-23  PT End of Session - 05/02/23 1102     Visit Number 26    Number of Visits 29    Date for PT Re-Evaluation 05/07/23    Authorization Type medicare and tricare    Progress Note Due on Visit 30    PT Start Time 1102    PT Stop Time 1142    PT Time Calculation (min) 40 min    Activity Tolerance Patient tolerated treatment well    Behavior During Therapy WFL for tasks assessed/performed            Past Medical History:  Diagnosis Date   Allergy    to cats and dogs   Diabetes mellitus    Hiatal hernia    and delayed gastric emptying/ sees [facility] for recurrent diarrhea   History of cardiovascular disorder 12/10/2008   Qualifier: Diagnosis of  By: Linford Arnold MD, Catherine     Hypertension    OSA (obstructive sleep apnea)    CPAP   Pedal edema    Spastic bladder    urology in [place]   TIA (transient ischemic attack)    hx of- sees Dr Rayburn Ma (Neurology)   Past Surgical History:  Procedure Laterality Date   CATARACT EXTRACTION  08/2021   IR ANGIOGRAM SELECTIVE EACH ADDITIONAL VESSEL  04/19/2023   IR ANGIOGRAM SELECTIVE EACH ADDITIONAL VESSEL  04/19/2023   IR ANGIOGRAM SELECTIVE EACH ADDITIONAL VESSEL  04/19/2023   IR EMBO ARTERIAL NOT HEMORR HEMANG INC GUIDE ROADMAPPING  04/19/2023   IR RADIOLOGIST EVAL & MGMT  03/08/2023   IR US GUIDE VASC ACCESS RIGHT  04/19/2023   vaginal skin tag removal     Patient Active Problem List   Diagnosis Date Noted   Diabetic retinopathy (HCC) 02/26/2023   Lumbar spondylosis 07/03/2022   Lumbar spinal stenosis 02/26/2022   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/2022   Bilateral hip pain 01/12/2022   Hearing loss due to cerumen impaction, right 01/12/2022   Hammertoe, bilateral 08/23/2021   History of foot ulcer 08/23/2021   Primary  osteoarthritis of both knees 12/09/2020   History of burning pain in leg 08/29/2020   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   Tremor 05/05/2020   Pre-ulcerative corn or callous 01/29/2020   History of transient ischemic attack (TIA) 05/15/2018   Family history of ovarian cancer 04/02/2017   Aortic atherosclerosis (HCC) 08/31/2016   Venous stasis 08/10/2016   Palpitations 07/16/2016   Atrial dilatation, left 07/16/2016   Atrial septal aneurysm 07/16/2016   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   Cataract 04/18/2012   Hyperlipidemia 02/13/2011   Pulmonary nodules 09/22/2010   LEG PAIN 09/12/2009   Sleep apnea 04/13/2009   GERD 03/14/2009   Morbid obesity (HCC) 12/10/2008   Essential hypertension, benign 12/10/2008   RAYNAUD'S DISEASE 12/10/2008   Osteoarthritis 12/10/2008   URINARY INCONTINENCE 12/10/2008    PCP: [name], MD REFERRING PROVIDER: [name], MD REFERRING DIAG:  M17.0 (ICD-10-CM) - Primary osteoarthritis of both knees   THERAPY DIAG:  Muscle weakness (generalized)  Chronic pain of right knee  Chronic pain of left knee  Other abnormalities of gait and mobility  Rationale for Evaluation and Treatment: Rehabilitation  ONSET DATE: 12/17/22  SUBJECTIVE:  SUBJECTIVE STATEMENT: Patient reports she scheduled the spinal injection for Wednesday 05/25/23. Patient reports no significant  changes in knee and back pain since last visit.    PERTINENT HISTORY: Diabetes, HTN, TIA, spinal stenosis, polymyalgia rheumatica PAIN:  Are you having pain? Yes: NPRS scale: 5 /10 Pain location: back (hip to hip) and knees Pain description: "snap, crackle, pop" pain, instability at knees where they give out, and neuropathy in her feet Aggravating factors: trying to move after standing, pivoting to step  Relieving factors: nothing  PRECAUTIONS: Fall  WEIGHT BEARING RESTRICTIONS: No  FALLS:  Has patient fallen in last 6  months? Yes. Number of falls 1  PATIENT GOALS: "I'm not sure. I don't know how much therapy will help before knee surgery."  Be able to get in and out of her car easier-- right now, it is so slow and a long process.   OBJECTIVE:  (Measures in this section from initial evaluation unless otherwise noted) PATIENT SURVEYS:  FOTO n/a-- mobility focus SENSATION: Notes neuropathy with some burning in feet, dec'd sensation EDEMA:  Bilateral swelling noted in LEs  LOWER EXTREMITY ROM: Active ROM Right eval Left eval Right 04/05/23 Left 04/05/23  Hip flexion      Hip extension      Hip abduction      Hip adduction      Hip internal rotation      Hip external rotation      Knee flexion 110 110 111 105  Knee extension 0 0    Ankle dorsiflexion      Ankle plantarflexion      Ankle inversion      Ankle eversion       (Blank rows = not tested)  LOWER EXTREMITY MMT: *can lift against gravity MMT Right eval Left eval Right 02/18/23 Left 02/18/23 Right 04/05/23 Left 04/05/23  Hip flexion 3/5 3/5 4+/5 4+/5 4+/5 4+/5  Hip extension        Hip abduction        Hip adduction        Hip internal rotation        Hip external rotation        Knee flexion 3/5 3/5 5/5 5/5    Knee extension 3/5 3/5 5/5 5/5    Ankle dorsiflexion 4/5 4/5      Ankle plantarflexion 3/5 3/5      Ankle inversion        Ankle eversion         (Blank rows = not tested) FUNCTIONAL TESTS:  5 time sit to stand=56.61  Gait speed=0.302 ft/sec *significantly slowed speed* GAIT: Distance walked: with [facility] slowed pace x 75 feet requiring increased time Assistive device utilized: Quad cane large base Level of assistance: Modified independence  slowed pace Comments: patient's quad cane is too high and she switches R and L sides-- PT to address Shoewear: Patient has to use slip on shoes and these slide during gait activities-- PT to address   [facility] Adult PT Treatment:                                                DATE:  05/02/2023 Therapeutic Exercise: Supine: LTR x10 Hooklying hip add iso ball squeeze 10x5" Bridges + ball b/w knees 2x10 Side Lying: Hip abd straight leg raises --> parallel x10, IR x10 Fwd/Bkwd leg kicks x10 Hip extension on diagonal 2x10 Seated: Knee extension isometric with orange PB 10x5" (B) Ambulating  x 2 laps working on hip extension and increasing trunk extension Manual Therapy: Roller stick glutes, lateral quads, ITB   [facility] Adult PT Treatment:                                                DATE: 04/29/2023 Therapeutic Exercise: Side Lying:  Straight leg hip abd x15 Fwd/Bkwd leg kicks x10 Hip extension on diagonal 2x10 Supine: AROM heel slides x10  SAQ R 0#, L 3#AW  2x10 Marching x20 STS from corner of mat table x10 (no HHA) 4" step --> step up/down x5 leading each leg Standing hip extension --> discontinued d/t knee pain ("popping") Manual Therapy: Roller stick glutes, lateral quads, ITB    [facility] Adult PT Treatment:                                                DATE: 04/18/23 Therapeutic Exercise: Supine SAQ 3# 2x10 Hip flexion 3# 2x10 Standing Hip abd 3# 2x10 Hip ext 3# 2x10 Sit<>stand x5 Ambulating x 2 laps working on hip extension and increasing trunk extension   PATIENT EDUCATION:  Education details: Updated HEP Person educated: Patient Education method: Programmer, multimedia, Facilities manager, and Handouts Education comprehension: verbalized understanding, returned demonstration, and needs further education  HOME EXERCISE PROGRAM: Access Code: [email or web address] URL: [email or web address] Date: 03/28/2023 Prepared by: Carlynn Herald  Exercises - Supine Active Straight Leg Raise  - 1 x daily - 7 x weekly - 1 sets - 10 reps - Supine Quadriceps Stretch with Strap on Table  - 2 x daily - 7 x weekly - 1 sets - 3 reps - 30-60 sec hold - Sit to Stand with Armchair  - 1 x daily - 7 x weekly - 1 sets - 5 reps - Lateral Step Up with Counter Support  - 1 x daily  - 7 x weekly - 3 sets - 10 reps - Forward Step Up with Counter Support  - 1 x daily - 7 x weekly - 3 sets - 10 reps - Side Stepping with Counter Support  - 1 x daily - 7 x weekly - 3 sets - 10 reps - Forward and Backward Step Over with Counter Support  - 1 x daily - 7 x weekly - 1 sets - 10 reps - Sidelying Hip Abduction  - 1 x daily - 7 x weekly - 2 sets - 10 reps - Sidelying Diagonal Hip Abduction  - 1 x daily - 7 x weekly - 2 sets - 10 reps - Seated Isometric Knee Extension  - 1 x daily - 7 x weekly - 2 sets - 10 reps - 5 sec hold  ASSESSMENT:  CLINICAL IMPRESSION: LE strengthening exercises continued, focusing on glute and quad activation. Gait training continued with focus on hip extension and upright standing posture.   OBJECTIVE IMPAIRMENTS: Abnormal gait, decreased activity tolerance, decreased balance, decreased mobility, decreased strength, impaired flexibility, obesity, and pain.   GOALS: Goals reviewed with patient? Yes  UPDATED GOALS: SHORT TERM GOALS: Target date: 04/07/23  1.  The patient will report dec'd pain in R leg while driving.  Baseline: Notes pain with being in the car has increased.  Goal status: MET *reports on 03/18/23 that she modified set up  using a seat cushion and lumbar support in the car.  LONG TERM GOALS: Target date: 05/07/23  The patient will be indep with progression of HEP. Baseline: doing HEP 1-2x/week Goal status: IN PROGRESS   2.   The patient will improve gait speed to > or equal to 1.3  ft/sec to demo transition to community ambulation status. Baseline:  1.11 ft/sec  Goal status:  MET-- 1.38 ft/sec on 04/15/23  4.  The patient will perform 5 time sit<>stand in < or equal to 20 seconds. Baseline: 19 sec Goal status: MET  5.  The patient will report ability to stand at the kitchen counter x 10 minutes without sensation of not being able to lift her legs after standing. Baseline:  Able to lift leg with less pain Goal status:MET On  04/15/23   PLAN:  PT FREQUENCY: 2x/week  PT DURATION: 8 weeks  PLANNED INTERVENTIONS: Therapeutic exercises, Therapeutic activity, Neuromuscular re-education, Balance training, Gait training, Patient/Family education, Self Care, and Joint mobilization  PLAN FOR NEXT SESSION: Re-eval next visit  Sanjuana Mae, PTA 05/02/2023 11:43 AM

## 2023-05-06 ENCOUNTER — Ambulatory Visit: Payer: Medicare Other | Attending: Sports Medicine | Admitting: Rehabilitative and Restorative Service Providers"

## 2023-05-06 ENCOUNTER — Encounter: Payer: Self-pay | Admitting: Rehabilitative and Restorative Service Providers"

## 2023-05-06 DIAGNOSIS — R2689 Other abnormalities of gait and mobility: Secondary | ICD-10-CM | POA: Diagnosis present

## 2023-05-06 DIAGNOSIS — M25562 Pain in left knee: Secondary | ICD-10-CM | POA: Diagnosis present

## 2023-05-06 DIAGNOSIS — M6281 Muscle weakness (generalized): Secondary | ICD-10-CM | POA: Diagnosis not present

## 2023-05-06 DIAGNOSIS — G8929 Other chronic pain: Secondary | ICD-10-CM | POA: Diagnosis present

## 2023-05-06 DIAGNOSIS — M25561 Pain in right knee: Secondary | ICD-10-CM | POA: Diagnosis present

## 2023-05-06 NOTE — Therapy (Signed)
OUTPATIENT PHYSICAL THERAPY LOWER EXTREMITY TREATMENT and Recertification  Patient Name: Natalie Wright MRN: [id number] DOB:11/14/1950, 72 y.o., female Today's Date: 05/06/2023 Dates of service: 12-31-22 to 04-09-23  PT End of Session - 05/06/23 1406     Visit Number 27    Number of Visits 35    Date for PT Re-Evaluation 07/05/23    Authorization Type medicare and tricare    Progress Note Due on Visit 30    PT Start Time 1406    PT Stop Time 1445    PT Time Calculation (min) 39 min    Activity Tolerance Patient tolerated treatment well    Behavior During Therapy WFL for tasks assessed/performed             Past Medical History:  Diagnosis Date   Allergy    to cats and dogs   Diabetes mellitus    Hiatal hernia    and delayed gastric emptying/ sees [facility] for recurrent diarrhea   History of cardiovascular disorder 12/10/2008   Qualifier: Diagnosis of  By: Linford Arnold MD, Catherine     Hypertension    OSA (obstructive sleep apnea)    CPAP   Pedal edema    Spastic bladder    urology in [place]   TIA (transient ischemic attack)    hx of- sees Dr Rayburn Ma (Neurology)   Past Surgical History:  Procedure Laterality Date   CATARACT EXTRACTION  08/2021   IR ANGIOGRAM SELECTIVE EACH ADDITIONAL VESSEL  04/19/2023   IR ANGIOGRAM SELECTIVE EACH ADDITIONAL VESSEL  04/19/2023   IR ANGIOGRAM SELECTIVE EACH ADDITIONAL VESSEL  04/19/2023   IR EMBO ARTERIAL NOT HEMORR HEMANG INC GUIDE ROADMAPPING  04/19/2023   IR RADIOLOGIST EVAL & MGMT  03/08/2023   IR US GUIDE VASC ACCESS RIGHT  04/19/2023   vaginal skin tag removal     Patient Active Problem List   Diagnosis Date Noted   Diabetic retinopathy (HCC) 02/26/2023   Lumbar spondylosis 07/03/2022   Lumbar spinal stenosis 02/26/2022   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/2022   Bilateral hip pain 01/12/2022   Hearing loss due to cerumen impaction, right 01/12/2022   Hammertoe, bilateral 08/23/2021   History of foot ulcer 08/23/2021    Primary osteoarthritis of both knees 12/09/2020   History of burning pain in leg 08/29/2020   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   Tremor 05/05/2020   Pre-ulcerative corn or callous 01/29/2020   History of transient ischemic attack (TIA) 05/15/2018   Family history of ovarian cancer 04/02/2017   Aortic atherosclerosis (HCC) 08/31/2016   Venous stasis 08/10/2016   Palpitations 07/16/2016   Atrial dilatation, left 07/16/2016   Atrial septal aneurysm 07/16/2016   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   Cataract 04/18/2012   Hyperlipidemia 02/13/2011   Pulmonary nodules 09/22/2010   LEG PAIN 09/12/2009   Sleep apnea 04/13/2009   GERD 03/14/2009   Morbid obesity (HCC) 12/10/2008   Essential hypertension, benign 12/10/2008   RAYNAUD'S DISEASE 12/10/2008   Osteoarthritis 12/10/2008   URINARY INCONTINENCE 12/10/2008    PCP: [name], MD REFERRING PROVIDER: [name], MD REFERRING DIAG:  M17.0 (ICD-10-CM) - Primary osteoarthritis of both knees   THERAPY DIAG:  Muscle weakness (generalized)  Chronic pain of right knee  Chronic pain of left knee  Other abnormalities of gait and mobility  Rationale for Evaluation and Treatment: Rehabilitation  ONSET DATE: 12/17/22  SUBJECTIVE:  SUBJECTIVE STATEMENT: The patient reports her stamina is a lot better. She feels improvement  in her R knee since injection and now can stand up, clean up in kitchen, and this helps her with ADLs. The patient works 1 day/wee and is sitting during her occupational tasks. Spinal injection for Wednesday 05/25/23. Patient reports no significant changes in knee and back pain since last visit.  She uses a quad cane to walk into restaurants and for short errands. She does grocery pick up services.   PERTINENT HISTORY: Diabetes, HTN, TIA, spinal stenosis, polymyalgia rheumatica PAIN:  Are you having pain? Yes: NPRS scale: 5 /10 Pain location: back (hip to  hip) and knees Pain description: "snap, crackle, pop" pain, instability at knees where they give out, and neuropathy in her feet Aggravating factors: trying to move after standing, pivoting to step  Relieving factors: nothing  PRECAUTIONS: Fall  WEIGHT BEARING RESTRICTIONS: No  FALLS:  Has patient fallen in last 6 months? Yes. Number of falls 1  PATIENT GOALS: "I'm not sure. I don't know how much therapy will help before knee surgery."  Be able to get in and out of her car easier-- right now, it is so slow and a long process.   OBJECTIVE:  (Measures in this section from initial evaluation unless otherwise noted) PATIENT SURVEYS:  FOTO n/a-- mobility focus SENSATION: Notes neuropathy with some burning in feet, dec'd sensation EDEMA:  Bilateral swelling noted in LEs  LOWER EXTREMITY ROM: Active ROM Right eval Left eval Right 04/05/23 Left 04/05/23  Hip flexion      Hip extension      Hip abduction      Hip adduction      Hip internal rotation      Hip external rotation      Knee flexion 110 110 111 105  Knee extension 0 0    Ankle dorsiflexion      Ankle plantarflexion      Ankle inversion      Ankle eversion       (Blank rows = not tested)  LOWER EXTREMITY MMT: *can lift against gravity MMT Right eval Left eval Right 02/18/23 Left 02/18/23 Right 04/05/23 Left 04/05/23  Hip flexion 3/5 3/5 4+/5 4+/5 4+/5 4+/5  Hip extension        Hip abduction        Hip adduction        Hip internal rotation        Hip external rotation        Knee flexion 3/5 3/5 5/5 5/5    Knee extension 3/5 3/5 5/5 5/5    Ankle dorsiflexion 4/5 4/5      Ankle plantarflexion 3/5 3/5      Ankle inversion        Ankle eversion         (Blank rows = not tested) FUNCTIONAL TESTS:  5 time sit to stand=56.61  Gait speed=0.302 ft/sec *significantly slowed speed* GAIT: Distance walked: with [facility] slowed pace x 75 feet requiring increased time Assistive device utilized: Quad cane large  base Level of assistance: Modified independence  slowed pace Comments: patient's quad cane is too high and she switches R and L sides-- PT to address Shoewear: Patient has to use slip on shoes and these slide during gait activities-- PT to address   [facility] Adult PT Treatment:  DATE: 05/06/23 Therapeutic Exercise: Supine SLR x 2# x 12 reps Sidelying Hip abduction x 12 reps 2#  Hip diagonals x 12 reps 2# Seated Isometric knee extension-- not doing this one at home Standing Sit<>stand x 10 reps Gait: 140 feet with RW mod indep Attempted gait with [facility]-- she uses [facility] in community and [facility] [place] was challenging with SBA-- completed 8 feet and then we switched back to RW Self Care: Discussed HEP and reviewed She is not doing quad stretch b/c of edge of bed, she is doing bridges (updated medbridge to reflect)   [facility] Adult PT Treatment:                                                DATE: 05/02/2023 Therapeutic Exercise: Supine: LTR x10 Hooklying hip add iso ball squeeze 10x5" Bridges + ball b/w knees 2x10 Side Lying: Hip abd straight leg raises --> parallel x10, IR x10 Fwd/Bkwd leg kicks x10 Hip extension on diagonal 2x10 Seated: Knee extension isometric with orange PB 10x5" (B) Ambulating x 2 laps working on hip extension and increasing trunk extension Manual Therapy: Roller stick glutes, lateral quads, ITB   [facility] Adult PT Treatment:                                                DATE: 04/29/2023 Therapeutic Exercise: Side Lying:  Straight leg hip abd x15 Fwd/Bkwd leg kicks x10 Hip extension on diagonal 2x10 Supine: AROM heel slides x10  SAQ R 0#, L 3#AW  2x10 Marching x20 STS from corner of mat table x10 (no HHA) 4" step --> step up/down x5 leading each leg Standing hip extension --> discontinued d/t knee pain ("popping") Manual Therapy: Roller stick glutes, lateral quads, ITB    [facility] Adult PT Treatment:                                                 DATE: 04/18/23 Therapeutic Exercise: Supine SAQ 3# 2x10 Hip flexion 3# 2x10 Standing Hip abd 3# 2x10 Hip ext 3# 2x10 Sit<>stand x5 Ambulating x 2 laps working on hip extension and increasing trunk extension   PATIENT EDUCATION:  Education details: Updated HEP Person educated: Patient Education method: Programmer, multimedia, Facilities manager, and Handouts Education comprehension: verbalized understanding, returned demonstration, and needs further education  HOME EXERCISE PROGRAM: Access Code: [email or web address] URL: [email or web address] Date: 05/06/2023 Prepared by: Margretta Ditty  Exercises - Supine Active Straight Leg Raise  - 1 x daily - 7 x weekly - 1 sets - 10 reps - Sidelying Hip Abduction  - 1 x daily - 7 x weekly - 2 sets - 10 reps - Sidelying Diagonal Hip Abduction  - 1 x daily - 7 x weekly - 2 sets - 10 reps - Supine Bridge  - 2 x daily - 7 x weekly - 1 sets - 10 reps - Sit to Stand with Armchair  - 1 x daily - 7 x weekly - 1 sets - 10 reps - Lateral Step Up with Counter Support  - 1 x daily - 7 x weekly - 3 sets - 10 reps -  Forward Step Up with Counter Support  - 1 x daily - 7 x weekly - 3 sets - 10 reps - Side Stepping with Counter Support  - 1 x daily - 7 x weekly - 3 sets - 10 reps  ASSESSMENT:  CLINICAL IMPRESSION: PT and patient discussed ongoing therapy plan. She is proficient with HEP and doing regularly. She notes improved stamina. She is scheduled to undergo a spinal injection and a L knee ablation. She has long term goals of losing another 75# in order to be able to be referred to an orthopedic surgeon for TKRs. Due to upcoming procedures, PT is going to continue current plan at 1x/week. We will then progress HEP since pt may have increased tolerance to standing and strengthening tasks as procedures completed. Also, plan to work on gait training with [facility] for entering into/out of stores/restaurants for short distance mobility.   OBJECTIVE  IMPAIRMENTS: Abnormal gait, decreased activity tolerance, decreased balance, decreased mobility, decreased strength, impaired flexibility, obesity, and pain.   GOALS: Goals reviewed with patient? Yes  UPDATED GOALS: SHORT TERM GOALS: Target date: 04/07/23  1.  The patient will report dec'd pain in R leg while driving.  Baseline: Notes pain with being in the car has increased.  Goal status: MET *reports on 03/18/23 that she modified set up using a seat cushion and lumbar support in the car.  LONG TERM GOALS: Target date: 05/07/23  The patient will be indep with progression of HEP. Baseline: doing HEP 1-2x/week Goal status:PARTIALLY MET  2.   The patient will improve gait speed to > or equal to 1.3  ft/sec to demo transition to community ambulation status. Baseline:  1.11 ft/sec  Goal status:  MET-- 1.38 ft/sec on 04/15/23  4.  The patient will perform 5 time sit<>stand in < or equal to 20 seconds. Baseline: 19 sec Goal status: MET  5.  The patient will report ability to stand at the kitchen counter x 10 minutes without sensation of not being able to lift her legs after standing. Baseline:  Able to lift leg with less pain Goal status:MET On 04/15/23  UPDATED GOALS: SHORT TERM GOALS: Target date: 06/05/23  1.  The patient will report standing tolerance > 15 minutes at kitchen countertop-- will demo in clinic with standing exercise and activities. Baseline: 10 minutes. Goal status: UPDATED  LONG TERM GOALS: Target date: 07/05/23  The patient will be indep with progression of HEP. Baseline: doing HEP 1-2x/week Goal status: UPDATED TARGET DATE  2.   The patient will improve gait speed to > or equal to 1.6  ft/sec to demo improving community ambulation status. Baseline:  1.38 seconds on 04/15/23 Goal status:  UPDATED   3.  The patient will perform 5 time sit<>stand in < or equal to 16 seconds. Baseline: 19 sec Goal status: UPDATED  4.  The patient will report ability to stand  at the kitchen counter x 10 minutes without sensation of not being able to lift her legs after standing. Baseline:  Able to lift leg with less pain Goal status:MET On 04/15/23   PLAN:  PT FREQUENCY:1-2x/week  PT DURATION: 8 weeks  PLANNED INTERVENTIONS: Therapeutic exercises, Therapeutic activity, Neuromuscular re-education, Balance training, Gait training, Patient/Family education, Self Care, and Joint mobilization  PLAN FOR NEXT SESSION:   work on standing tolerance, gait with [facility] (ask patient to bring in future visits), gait with RW working on speed and distance for endurance. Continue LE strengthening at reduced frequency (1x/week) while  awaiting next procedures.  Ramatoulaye Pack, PT 05/06/2023 4:11 PM

## 2023-05-07 ENCOUNTER — Ambulatory Visit: Payer: Medicare Other | Admitting: Family Medicine

## 2023-05-07 DIAGNOSIS — Z0000 Encounter for general adult medical examination without abnormal findings: Secondary | ICD-10-CM | POA: Diagnosis not present

## 2023-05-07 NOTE — Patient Instructions (Addendum)
MEDICARE ANNUAL WELLNESS VISIT Health Maintenance Summary and Written Plan of Care  [name] ,  Thank you for allowing me to perform your Medicare Annual Wellness Visit and for your ongoing commitment to your health.   Health Maintenance & Immunization History Health Maintenance  Topic Date Due   OPHTHALMOLOGY EXAM  05/07/2023 (Originally 04/24/2023)   COVID-19 Vaccine (5 - 2023-24 season) 05/23/2023 (Originally 03/03/2023)   Fecal DNA (Cologuard)  07/02/2023 (Originally 12/11/2022)   HEMOGLOBIN A1C  08/29/2023   Diabetic kidney evaluation - Urine ACR  02/26/2024   FOOT EXAM  02/26/2024   Diabetic kidney evaluation - eGFR measurement  04/18/2024   Medicare Annual Wellness (AWV)  05/06/2024   MAMMOGRAM  06/22/2024   DEXA SCAN  06/02/2027   DTaP/Tdap/Td (3 - Td or Tdap) 10/30/2029   Pneumonia Vaccine 54+ Years old  Completed   INFLUENZA VACCINE  Completed   Hepatitis C Screening  Completed   Zoster Vaccines- Shingrix  Completed   HPV VACCINES  Aged Out   Colonoscopy  Discontinued   Immunization History  Administered Date(s) Administered   Fluad Quad(high Dose 65+) 05/15/2019, 05/05/2020, 03/28/2021, 05/29/2022   Fluad Trivalent(High Dose 65+) 03/13/2023   Influenza Split 06/14/2011, 07/04/2012   Influenza Whole 04/13/2009   Influenza, High Dose Seasonal PF 05/10/2016, 04/12/2017, 05/15/2018   Influenza,inj,Quad PF,6+ Mos 05/03/2014, 05/20/2015   PFIZER(Purple Top)SARS-COV-2 Vaccination 07/14/2019, 08/04/2019, 04/20/2020   PPD Test 03/23/2011, 01/09/2013, 05/03/2014, 02/10/2016   Pfizer Covid-19 Vaccine Bivalent Booster 46yrs & up 04/13/2021   Pneumococcal Conjugate-13 11/19/2014   Pneumococcal Polysaccharide-23 05/13/2006, 01/04/2017   Td 04/25/2009   Tdap 10/31/2019   Zoster Recombinant(Shingrix) 09/22/2019, 11/22/2019   Zoster, Live 05/10/2016    These are the patient goals that we discussed:  Goals Addressed               This Visit's Progress     Patient  Stated (pt-stated)        Patient stated that she would like to continue to work on her weight loss. She has lost 125 lbs and has 75 lbs to go to be able to get her knee replacements. She wants to continue to work on her stamina and strength in her legs to help with standing for long period of time.         This is a list of Health Maintenance Items that are overdue or due now: Colorectal cancer screening - completed one in January; unsure when she is due and she will follow up.  Diabetic eye exam - need records Mammogram- due in December; patient will schedule.  Orders/Referrals Placed Today: No orders of the defined types were placed in this encounter.  (Contact our referral department at [phone] if you have not spoken with someone about your referral appointment within the next 5 days)    Follow-up Plan Follow-up with Agapito Games, MD as planned Patient will get eye exam records when possible. Medicare wellness visit in one year.  Patient will access AVS on my chart.      Health Maintenance, Female Adopting a healthy lifestyle and getting preventive care are important in promoting health and wellness. Ask your health care provider about: The right schedule for you to have regular tests and exams. Things you can do on your own to prevent diseases and keep yourself healthy. What should I know about diet, weight, and exercise? Eat a healthy diet  Eat a diet that includes plenty of vegetables, fruits, low-fat dairy products, and lean  protein. Do not eat a lot of foods that are high in solid fats, added sugars, or sodium. Maintain a healthy weight Body mass index (BMI) is used to identify weight problems. It estimates body fat based on height and weight. Your health care provider can help determine your BMI and help you achieve or maintain a healthy weight. Get regular exercise Get regular exercise. This is one of the most important things you can do for your  health. Most adults should: Exercise for at least 150 minutes each week. The exercise should increase your heart rate and make you sweat (moderate-intensity exercise). Do strengthening exercises at least twice a week. This is in addition to the moderate-intensity exercise. Spend less time sitting. Even light physical activity can be beneficial. Watch cholesterol and blood lipids Have your blood tested for lipids and cholesterol at 72 years of age, then have this test every 5 years. Have your cholesterol levels checked more often if: Your lipid or cholesterol levels are high. You are older than 72 years of age. You are at high risk for heart disease. What should I know about cancer screening? Depending on your health history and family history, you may need to have cancer screening at various ages. This may include screening for: Breast cancer. Cervical cancer. Colorectal cancer. Skin cancer. Lung cancer. What should I know about heart disease, diabetes, and high blood pressure? Blood pressure and heart disease High blood pressure causes heart disease and increases the risk of stroke. This is more likely to develop in people who have high blood pressure readings or are overweight. Have your blood pressure checked: Every 3-5 years if you are 78-65 years of age. Every year if you are 71 years old or older. Diabetes Have regular diabetes screenings. This checks your fasting blood sugar level. Have the screening done: Once every three years after age 54 if you are at a normal weight and have a low risk for diabetes. More often and at a younger age if you are overweight or have a high risk for diabetes. What should I know about preventing infection? Hepatitis B If you have a higher risk for hepatitis B, you should be screened for this virus. Talk with your health care provider to find out if you are at risk for hepatitis B infection. Hepatitis C Testing is recommended for: Everyone born  from [birth year] through 1965. Anyone with known risk factors for hepatitis C. Sexually transmitted infections (STIs) Get screened for STIs, including gonorrhea and chlamydia, if: You are sexually active and are younger than 72 years of age. You are older than 72 years of age and your health care provider tells you that you are at risk for this type of infection. Your sexual activity has changed since you were last screened, and you are at increased risk for chlamydia or gonorrhea. Ask your health care provider if you are at risk. Ask your health care provider about whether you are at high risk for HIV. Your health care provider may recommend a prescription medicine to help prevent HIV infection. If you choose to take medicine to prevent HIV, you should first get tested for HIV. You should then be tested every 3 months for as long as you are taking the medicine. Pregnancy If you are about to stop having your period (premenopausal) and you may become pregnant, seek counseling before you get pregnant. Take 400 to 800 micrograms (mcg) of folic acid every day if you become pregnant. Ask for birth control (contraception)  if you want to prevent pregnancy. Osteoporosis and menopause Osteoporosis is a disease in which the bones lose minerals and strength with aging. This can result in bone fractures. If you are 106 years old or older, or if you are at risk for osteoporosis and fractures, ask your health care provider if you should: Be screened for bone loss. Take a calcium or vitamin D supplement to lower your risk of fractures. Be given hormone replacement therapy (HRT) to treat symptoms of menopause. Follow these instructions at home: Alcohol use Do not drink alcohol if: Your health care provider tells you not to drink. You are pregnant, may be pregnant, or are planning to become pregnant. If you drink alcohol: Limit how much you have to: 0-1 drink a day. Know how much alcohol is in your drink. In the  [place] one drink equals one 12 oz bottle of beer (355 mL), one 5 oz glass of wine (148 mL), or one 1 oz glass of hard liquor (44 mL). Lifestyle Do not use any products that contain nicotine or tobacco. These products include cigarettes, chewing tobacco, and vaping devices, such as e-cigarettes. If you need help quitting, ask your health care provider. Do not use street drugs. Do not share needles. Ask your health care provider for help if you need support or information about quitting drugs. General instructions Schedule regular health, dental, and eye exams. Stay current with your vaccines. Tell your health care provider if: You often feel depressed. You have ever been abused or do not feel safe at home. Summary Adopting a healthy lifestyle and getting preventive care are important in promoting health and wellness. Follow your health care provider's instructions about healthy diet, exercising, and getting tested or screened for diseases. Follow your health care provider's instructions on monitoring your cholesterol and blood pressure. This information is not intended to replace advice given to you by your health care provider. Make sure you discuss any questions you have with your health care provider. Document Revised: 11/07/2020 Document Reviewed: 11/07/2020 Elsevier Patient Education  2024 [employer].

## 2023-05-07 NOTE — Progress Notes (Signed)
MEDICARE ANNUAL WELLNESS VISIT  05/07/2023  Telephone Visit Disclaimer This Medicare AWV was conducted by telephone due to national recommendations for restrictions regarding the COVID-19 Pandemic (e.g. social distancing).  I verified, using two identifiers, that I am speaking with Natalie Wright or their authorized healthcare agent. I discussed the limitations, risks, security, and privacy concerns of performing an evaluation and management service by telephone and the potential availability of an in-person appointment in the future. The patient expressed understanding and agreed to proceed.  Location of Patient: Home Location of Provider (nurse):  In the office.  Subjective:    Natalie Wright is a 72 y.o. female patient of Metheney, Barbarann Ehlers, MD who had a Medicare Annual Wellness Visit today via telephone. Natalie Wright is Working part time and lives alone. she has 2 children. she reports that she is socially active and does interact with friends/family regularly. she is moderately physically active and enjoys reading and doing crafts.  Patient Care Team: Agapito Games, MD as PCP - General     05/07/2023    1:11 PM 12/31/2022    2:51 PM 04/09/2022    3:59 PM 09/30/2020    1:19 PM 03/10/2019   11:10 AM  Advanced Directives  Does Patient Have a Medical Advance Directive? No No No No No  Would patient like information on creating a medical advance directive? No - Patient declined Yes (MAU/Ambulatory/Procedural Areas - Information given) No - Patient declined No - Patient declined No - Patient declined    Hospital Utilization Over the Past 12 Months: # of hospitalizations or ER visits: 0 # of surgeries: 0  Review of Systems    Patient reports that her overall health is better compared to last year.  History obtained from chart review and the patient  Patient Reported Readings (BP, Pulse, CBG, Weight, etc) none Per patient no change in vitals since last visit, unable to  obtain new vitals due to telehealth visit  Pain Assessment Pain : 0-10 Pain Score: 6  Pain Type: Chronic pain Pain Location: Knee Pain Orientation: Right, Left Pain Descriptors / Indicators: Constant Pain Onset: More than a month ago Pain Frequency: Constant Pain Relieving Factors: none  Pain Relieving Factors: none  Current Medications & Allergies (verified) Allergies as of 05/07/2023       Reactions   Atorvastatin Other (See Comments)   Patient stated muscle weakness Patient stated muscle weakness   Beclomethasone Other (See Comments)   unknown unknown   Cefaclor Hives   Pioglitazone Other (See Comments)   Mouth sores Mouth sores Mouth sores unknown REACTION: ulcers in the mouth   Pravastatin Other (See Comments)   Chest pain.  Chest pain.    Sulfa Antibiotics Hives   Other reaction(s): Agitation   Cephalexin Other (See Comments)   unknown   Lipitor [atorvastatin Calcium] Other (See Comments)   myalgias   Statins Other (See Comments)   Muscle pain Other reaction(s): Other (See Comments) Muscle pain        Medication List        Accurate as of May 07, 2023  1:25 PM. If you have any questions, ask your nurse or doctor.          AMBULATORY NON FORMULARY MEDICATION Medication Name: custom fit knee high compression stocking with 15-20 mmHg pressure. Open toe preferred.   amoxicillin-clavulanate 875-125 MG tablet Commonly known as: AUGMENTIN Take 1 tablet by mouth 2 (two) times daily.   aspirin 325 MG tablet Take 325 mg  by mouth daily.   cetirizine 10 MG tablet Commonly known as: ZYRTEC Take 10 mg by mouth daily.   chlorthalidone 25 MG tablet Commonly known as: HYGROTON TAKE 1 TABLET EVERY OTHER DAY   clopidogrel 75 MG tablet Commonly known as: PLAVIX Take 1 tablet (75 mg total) by mouth daily.   diltiazem 360 MG 24 hr capsule Commonly known as: CARDIZEM CD TAKE 1 CAPSULE(360 MG) BY MOUTH DAILY   DULoxetine 30 MG capsule Commonly  known as: CYMBALTA Take 1 capsule (30 mg total) by mouth daily. For musculoskeletal pain   Ferrous Sulfate Dried 200 (65 Fe) MG Tabs Take 1 tablet by mouth daily.   Fish Oil 1200 MG Caps Take 1 capsule by mouth daily.   fluconazole 150 MG tablet Commonly known as: DIFLUCAN TAKE 1 TABLET BY MOUTH ONCE   insulin glargine 100 UNIT/ML injection Commonly known as: Lantus INJECT 35 UNITS UNDER THE SKIN DAILY   Insulin Pen Needle 30G X 8 MM Misc Commonly known as: NovoFine For use each time injection for insulin   Jardiance 25 MG Tabs tablet Generic drug: empagliflozin TAKE 1 TABLET DAILY   latanoprost 0.005 % ophthalmic solution Commonly known as: XALATAN 1 drop.   lisinopril 20 MG tablet Commonly known as: ZESTRIL Take 1 tablet (20 mg total) by mouth daily.   meloxicam 15 MG tablet Commonly known as: MOBIC Take 1 tablet (15 mg total) by mouth daily as needed.   metFORMIN 1000 MG tablet Commonly known as: GLUCOPHAGE Take 1 tablet (1,000 mg total) by mouth 2 (two) times daily with a meal.   Misc. Devices Misc Transfer of care to [facility].  Please provide service to her current CPAP machine for OSA at 14 cm. water pressure.  Send directly to [place] at [facility].   pantoprazole 40 MG tablet Commonly known as: PROTONIX Take 1 tablet (40 mg total) by mouth 2 (two) times daily.   rosuvastatin 10 MG tablet Commonly known as: CRESTOR TAKE 1 TABLET ONCE A WEEK AT BEDTIME   traMADol 50 MG tablet Commonly known as: ULTRAM Take 1 tablet (50 mg total) by mouth every 8 (eight) hours as needed for moderate pain.   Trulicity 4.5 MG/0.5ML Soaj Generic drug: Dulaglutide Inject 4.5 mg as directed once a week.        History (reviewed): Past Medical History:  Diagnosis Date   Allergy    to cats and dogs   Anemia    Arthritis    Cataract    Diabetes mellitus    GERD (gastroesophageal reflux disease)    Glaucoma    Hiatal hernia    and delayed gastric emptying/ sees  [facility] for recurrent diarrhea   History of cardiovascular disorder 12/10/2008   Qualifier: Diagnosis of  By: Linford Arnold MD, Catherine     Hypertension    OSA (obstructive sleep apnea)    CPAP   Pedal edema    Sleep apnea    Spastic bladder    urology in [facility]   TIA (transient ischemic attack)    hx of- sees Dr Rayburn Ma (Neurology)   Past Surgical History:  Procedure Laterality Date   CATARACT EXTRACTION  08/2021   IR ANGIOGRAM SELECTIVE EACH ADDITIONAL VESSEL  04/19/2023   IR ANGIOGRAM SELECTIVE EACH ADDITIONAL VESSEL  04/19/2023   IR ANGIOGRAM SELECTIVE EACH ADDITIONAL VESSEL  04/19/2023   IR EMBO ARTERIAL NOT HEMORR HEMANG INC GUIDE ROADMAPPING  04/19/2023   IR RADIOLOGIST EVAL & MGMT  03/08/2023   IR  US GUIDE VASC ACCESS RIGHT  04/19/2023   TUBAL LIGATION     vaginal skin tag removal     Family History  Problem Relation Age of Onset   Diabetes Mother    Hypertension Mother    Glaucoma Mother    Cancer Mother        bone cancer   Alcohol abuse Father    Emphysema Father    Other Father        CHF   Ovarian cancer Sister    Endometrial cancer Sister        Endometrial stromal sarcoma   Social History   Socioeconomic History   Marital status: Widowed    Spouse name: Glynda Jaeger   Number of children: 2   Years of education: Masters   Highest education level: Master's degree (e.g., MA, MS, MEng, MEd, MSW, MBA)  Occupational History   Occupation: Conservation officer, 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   Retired and  works 1 day a week for her daughter just to be out and about. Enjoys reading and doing crafts in her free time.   Social Determinants of Health   Financial Resource Strain: Low Risk  (05/03/2023)   Overall Financial Resource Strain (CARDIA)    Difficulty of Paying Living Expenses: Not hard at all  Food Insecurity: No Food Insecurity (05/03/2023)   Hunger Vital Sign    Worried About Running Out of Food in the Last Year: Never true    Ran Out of Food in the Last Year: Never true  Transportation Needs: No Transportation Needs (05/03/2023)   PRAPARE - Administrator, Civil Service (Medical): No    Lack of Transportation (Non-Medical): No  Physical Activity: Inactive (05/03/2023)   Exercise Vital Sign    Days of Exercise per Week: 0 days    Minutes of Exercise per Session: 0 min  Stress: No Stress Concern Present (05/03/2023)   [employer] of Occupational Health - Occupational Stress Questionnaire    Feeling of Stress : Not at all  Social Connections: Socially Isolated (05/07/2023)   Social Connection and Isolation Panel [NHANES]    Frequency of Communication with Friends and Family: More than three times a week    Frequency of Social Gatherings with Friends and Family: Once a week    Attends Religious Services: Never    Database administrator or Organizations: No    Attends Banker Meetings: Never    Marital Status: Widowed    Activities of Daily Living    05/03/2023    9:58 AM 04/19/2023    9:08 AM  In your present state of health, do you have any difficulty performing the following activities:  Hearing? 0 0  Vision? 1 0  Difficulty concentrating or making decisions? 0 0  Walking or climbing stairs? 1   Dressing or bathing? 0   Doing errands, shopping? 1  Preparing Food and eating ? Y   Using the Toilet? N   In the past six months, have you accidently leaked urine? Y   Do you have problems with loss of bowel control? N   Managing your Medications?  N   Managing your Finances? N   Housekeeping or managing your Housekeeping? Y     Patient Education/ Literacy How often do you need to have someone help you when you read instructions, pamphlets, or other written materials from your doctor or pharmacy?: 1 - Never What is the last grade level you completed in school?: Masters degree  Exercise    Diet Patient reports consuming 2 meals a day and 2-3 snack(s) a day Patient reports that her primary diet is: Regular Patient reports that she does have regular access to food.   Depression Screen    05/07/2023    1:12 PM 09/10/2022    1:45 PM 04/09/2022    3:59 PM 03/20/2022    1:30 PM 02/26/2022    1:36 PM 11/23/2021   11:37 AM 07/18/2021   11:33 AM  PHQ 2/9 Scores  PHQ - 2 Score 0 0 0 0 0 0 0     Fall Risk    05/07/2023    1:12 PM 05/03/2023    9:58 AM 09/10/2022    1:45 PM 04/09/2022    3:59 PM 03/20/2022    1:30 PM  Fall Risk   Falls in the past year? 1 1 1  0 0  Number falls in past yr: 1 1 0 0 0  Injury with Fall? 0 0 0 0 0  Risk for fall due to : History of fall(s);Impaired mobility  Impaired balance/gait;Impaired mobility No Fall Risks No Fall Risks  Follow up Falls evaluation completed;Education provided;Falls prevention discussed  Falls evaluation completed Falls evaluation completed Falls evaluation completed     Objective:  Natalie Wright seemed alert and oriented and she participated appropriately during our telephone visit.  Blood Pressure Weight BMI  BP Readings from Last 3 Encounters:  04/19/23 (!) 143/53  02/26/23 92/70  10/31/22 (!) 145/48   Wt Readings from Last 3 Encounters:  04/19/23 275 lb (124.7 kg)  02/26/23 (!) 342 lb (155.1 kg)  09/10/22 (!) 315 lb (142.9 kg)   BMI Readings from Last 1 Encounters:  04/19/23 53.71 kg/m    *Unable to obtain current vital signs, weight, and BMI due to telephone visit type  Hearing/Vision  Natalie Wright did not seem to have difficulty with hearing/understanding during  the telephone conversation Reports that she has had a formal eye exam by an eye care professional within the past year Reports that she has not had a formal hearing evaluation within the past year *Unable to fully assess hearing and vision during telephone visit type  Cognitive Function:    05/07/2023    1:16 PM 04/09/2022    4:06 PM 09/30/2020    1:21 PM 03/10/2019   11:15 AM  6CIT Screen  What Year? 0 points 0 points 0 points 0 points  What month? 0 points 0 points 0 points 0 points  What time? 0 points 0 points 0 points 0 points  Count back from 20 0 points 2 points 0 points 0 points  Months in reverse 0 points 0 points 0 points 0 points  Repeat phrase 0 points 0 points 0 points 0 points  Total Score 0 points 2 points 0 points 0 points   (Normal:0-7, Significant for Dysfunction: >8)  Normal Cognitive Function  Screening: Yes   Immunization & Health Maintenance Record Immunization History  Administered Date(s) Administered   Fluad Quad(high Dose 65+) 05/15/2019, 05/05/2020, 03/28/2021, 05/29/2022   Fluad Trivalent(High Dose 65+) 03/13/2023   Influenza Split 06/14/2011, 07/04/2012   Influenza Whole 04/13/2009   Influenza, High Dose Seasonal PF 05/10/2016, 04/12/2017, 05/15/2018   Influenza,inj,Quad PF,6+ Mos 05/03/2014, 05/20/2015   PFIZER(Purple Top)SARS-COV-2 Vaccination 07/14/2019, 08/04/2019, 04/20/2020   PPD Test 03/23/2011, 01/09/2013, 05/03/2014, 02/10/2016   Pfizer Covid-19 Vaccine Bivalent Booster 18yrs & up 04/13/2021   Pneumococcal Conjugate-13 11/19/2014   Pneumococcal Polysaccharide-23 05/13/2006, 01/04/2017   Td 04/25/2009   Tdap 10/31/2019   Zoster Recombinant(Shingrix) 09/22/2019, 11/22/2019   Zoster, Live 05/10/2016    Health Maintenance  Topic Date Due   OPHTHALMOLOGY EXAM  05/07/2023 (Originally 04/24/2023)   COVID-19 Vaccine (5 - 2023-24 season) 05/23/2023 (Originally 03/03/2023)   Fecal DNA (Cologuard)  07/02/2023 (Originally 12/11/2022)   HEMOGLOBIN A1C   08/29/2023   Diabetic kidney evaluation - Urine ACR  02/26/2024   FOOT EXAM  02/26/2024   Diabetic kidney evaluation - eGFR measurement  04/18/2024   Medicare Annual Wellness (AWV)  05/06/2024   MAMMOGRAM  06/22/2024   DEXA SCAN  06/02/2027   DTaP/Tdap/Td (3 - Td or Tdap) 10/30/2029   Pneumonia Vaccine 29+ Years old  Completed   INFLUENZA VACCINE  Completed   Hepatitis C Screening  Completed   Zoster Vaccines- Shingrix  Completed   HPV VACCINES  Aged Out   Colonoscopy  Discontinued       Assessment  This is a routine wellness examination for [employer].  Health Maintenance: Due or Overdue There are no preventive care reminders to display for this patient.   Natalie Wright does not need a referral for Community Assistance: Care Management:   no Social Work:    no Prescription Assistance:  no Nutrition/Diabetes Education:  no   Plan:  Personalized Goals  Goals Addressed               This Visit's Progress     Patient Stated (pt-stated)        Patient stated that she would like to continue to work on her weight loss. She has lost 125 lbs and has 75 lbs to go to be able to get her knee replacements. She wants to continue to work on her stamina and strength in her legs to help with standing for long period of time.       Personalized Health Maintenance & Screening Recommendations  Colorectal cancer screening - completed one in January; unsure when she is due and she will follow up.  Diabetic eye exam - need records Mammogram- due in December; patient will schedule.  Lung Cancer Screening Recommended: no (Low Dose CT Chest recommended if Age 68-80 years, 20 pack-year currently smoking OR have quit w/in past 15 years) Hepatitis C Screening recommended: no HIV Screening recommended: no  Advanced Directives: Written information was not prepared per patient's request.  Referrals & Orders No orders of the defined types were placed in this  encounter.   Follow-up Plan Follow-up with Agapito Games, MD as planned Patient will get eye exam records when possible. Medicare wellness visit in one year.  Patient will access AVS on my chart.   I have personally reviewed and noted the following in the patient's chart:   Medical and social history Use of alcohol, tobacco or illicit drugs  Current medications and supplements Functional ability and status Nutritional status Physical activity Advanced directives  List of other physicians Hospitalizations, surgeries, and ER visits in previous 12 months Vitals Screenings to include cognitive, depression, and falls Referrals and appointments  In addition, I have reviewed and discussed with Natalie Wright certain preventive protocols, quality metrics, and best practice recommendations. A written personalized care plan for preventive services as well as general preventive health recommendations is available and can be mailed to the patient at her request.      Modesto Charon, RN BSN  05/07/2023

## 2023-05-09 ENCOUNTER — Telehealth: Payer: Self-pay | Admitting: Family Medicine

## 2023-05-09 DIAGNOSIS — L659 Nonscarring hair loss, unspecified: Secondary | ICD-10-CM

## 2023-05-09 NOTE — Telephone Encounter (Signed)
-----   Message from Nurse Norma Fredrickson K sent at 05/07/2023  1:26 PM EST ----- Regarding: referral Patient is interested in a referral to dermatology. She is experiencing some hair loss, excess skin due to weight loss and lichenoids ? She wanted to know if she can get a referral to dermatologist in [place].

## 2023-05-09 NOTE — Telephone Encounter (Signed)
Ref placed for dermatology  Orders Placed This Encounter  Procedures   Ambulatory referral to Dermatology    Referral Priority:   Routine    Referral Type:   Consultation    Referral Reason:   Specialty Services Required    Requested Specialty:   Dermatology    Number of Visits Requested:   1

## 2023-05-13 ENCOUNTER — Ambulatory Visit: Payer: Medicare Other

## 2023-05-13 DIAGNOSIS — M6281 Muscle weakness (generalized): Secondary | ICD-10-CM

## 2023-05-13 DIAGNOSIS — R2689 Other abnormalities of gait and mobility: Secondary | ICD-10-CM

## 2023-05-13 DIAGNOSIS — M25562 Pain in left knee: Secondary | ICD-10-CM | POA: Diagnosis not present

## 2023-05-13 DIAGNOSIS — G8929 Other chronic pain: Secondary | ICD-10-CM

## 2023-05-13 DIAGNOSIS — M25561 Pain in right knee: Secondary | ICD-10-CM | POA: Diagnosis not present

## 2023-05-13 NOTE — Therapy (Signed)
OUTPATIENT PHYSICAL THERAPY LOWER EXTREMITY TREATMENT    Patient Name: Natalie Wright MRN: [id number] DOB:1951/01/17, 72 y.o., female Today's Date: 05/13/2023 Dates of service: 12-31-22 to 04-09-23  PT End of Session - 05/13/23 1415     Visit Number 28    Number of Visits 35    Date for PT Re-Evaluation 07/05/23    Authorization Type medicare and tricare    Progress Note Due on Visit 30    PT Start Time 1413   pt arrived late   PT Stop Time 1445    PT Time Calculation (min) 32 min    Activity Tolerance Patient tolerated treatment well    Behavior During Therapy WFL for tasks assessed/performed             Past Medical History:  Diagnosis Date   Allergy    to cats and dogs   Anemia    Arthritis    Cataract    Diabetes mellitus    GERD (gastroesophageal reflux disease)    Glaucoma    Hiatal hernia    and delayed gastric emptying/ sees [facility] for recurrent diarrhea   History of cardiovascular disorder 12/10/2008   Qualifier: Diagnosis of  By: Linford Arnold MD, Catherine     Hypertension    OSA (obstructive sleep apnea)    CPAP   Pedal edema    Sleep apnea    Spastic bladder    urology in [place]   TIA (transient ischemic attack)    hx of- sees Dr Rayburn Ma (Neurology)   Past Surgical History:  Procedure Laterality Date   CATARACT EXTRACTION  08/2021   IR ANGIOGRAM SELECTIVE EACH ADDITIONAL VESSEL  04/19/2023   IR ANGIOGRAM SELECTIVE EACH ADDITIONAL VESSEL  04/19/2023   IR ANGIOGRAM SELECTIVE EACH ADDITIONAL VESSEL  04/19/2023   IR EMBO ARTERIAL NOT HEMORR HEMANG INC GUIDE ROADMAPPING  04/19/2023   IR RADIOLOGIST EVAL & MGMT  03/08/2023   IR US GUIDE VASC ACCESS RIGHT  04/19/2023   TUBAL LIGATION     vaginal skin tag removal     Patient Active Problem List   Diagnosis Date Noted   Diabetic retinopathy (HCC) 02/26/2023   Lumbar spondylosis 07/03/2022   Lumbar spinal stenosis 02/26/2022   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/2022   Bilateral hip pain  01/12/2022   Hearing loss due to cerumen impaction, right 01/12/2022   Hammertoe, bilateral 08/23/2021   History of foot ulcer 08/23/2021   Primary osteoarthritis of both knees 12/09/2020   History of burning pain in leg 08/29/2020   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   Tremor 05/05/2020   Pre-ulcerative corn or callous 01/29/2020   History of transient ischemic attack (TIA) 05/15/2018   Family history of ovarian cancer 04/02/2017   Aortic atherosclerosis (HCC) 08/31/2016   Venous stasis 08/10/2016   Palpitations 07/16/2016   Atrial dilatation, left 07/16/2016   Atrial septal aneurysm 07/16/2016   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   Cataract 04/18/2012   Hyperlipidemia 02/13/2011   Pulmonary nodules 09/22/2010   LEG PAIN 09/12/2009   Sleep apnea 04/13/2009   GERD 03/14/2009   Morbid obesity (HCC) 12/10/2008   Essential hypertension, benign 12/10/2008   RAYNAUD'S DISEASE 12/10/2008   Osteoarthritis 12/10/2008   URINARY INCONTINENCE 12/10/2008    PCP: [name], MD REFERRING PROVIDER: [name], MD REFERRING DIAG:  M17.0 (ICD-10-CM) - Primary osteoarthritis of both knees   THERAPY DIAG:  Muscle weakness (generalized)  Chronic pain of right knee  Chronic pain  of left knee  Other abnormalities of gait and mobility  Rationale for Evaluation and Treatment: Rehabilitation  ONSET DATE: 12/17/22  SUBJECTIVE:  SUBJECTIVE STATEMENT: Patient reports she is able to tolerate standing for 45 min before needing a short break. Patient states no change in knee/back pain; states she has some "slippage" in knees this morning.   PERTINENT HISTORY: Diabetes, HTN, TIA, spinal stenosis, polymyalgia rheumatica PAIN:  Are you having pain? Yes: NPRS scale: 5 /10 Pain location: back (hip to hip) and knees Pain description: "snap, crackle, pop" pain, instability at knees where they give out, and neuropathy in her feet Aggravating  factors: trying to move after standing, pivoting to step  Relieving factors: nothing  PRECAUTIONS: Fall  WEIGHT BEARING RESTRICTIONS: No  FALLS:  Has patient fallen in last 6 months? Yes. Number of falls 1  PATIENT GOALS: "I'm not sure. I don't know how much therapy will help before knee surgery."  Be able to get in and out of her car easier-- right now, it is so slow and a long process.   OBJECTIVE:  (Measures in this section from initial evaluation unless otherwise noted) PATIENT SURVEYS:  FOTO n/a-- mobility focus SENSATION: Notes neuropathy with some burning in feet, dec'd sensation EDEMA:  Bilateral swelling noted in LEs  LOWER EXTREMITY ROM: Active ROM Right eval Left eval Right 04/05/23 Left 04/05/23  Hip flexion      Hip extension      Hip abduction      Hip adduction      Hip internal rotation      Hip external rotation      Knee flexion 110 110 111 105  Knee extension 0 0    Ankle dorsiflexion      Ankle plantarflexion      Ankle inversion      Ankle eversion       (Blank rows = not tested)  LOWER EXTREMITY MMT: *can lift against gravity MMT Right eval Left eval Right 02/18/23 Left 02/18/23 Right 04/05/23 Left 04/05/23  Hip flexion 3/5 3/5 4+/5 4+/5 4+/5 4+/5  Hip extension        Hip abduction        Hip adduction        Hip internal rotation        Hip external rotation        Knee flexion 3/5 3/5 5/5 5/5    Knee extension 3/5 3/5 5/5 5/5    Ankle dorsiflexion 4/5 4/5      Ankle plantarflexion 3/5 3/5      Ankle inversion        Ankle eversion         (Blank rows = not tested) FUNCTIONAL TESTS:  5 time sit to stand=56.61  Gait speed=0.302 ft/sec *significantly slowed speed* GAIT: Distance walked: with [facility] slowed pace x 75 feet requiring increased time Assistive device utilized: Quad cane large base Level of assistance: Modified independence  slowed pace Comments: patient's quad cane is too high and she switches R and L sides-- PT to  address Shoewear: Patient has to use slip on shoes and these slide during gait activities-- PT to address   [facility] Adult PT Treatment:                                                DATE: 05/13/2023 Therapeutic  Exercise: Supine: Heel slides --> knee flexion AROM orange PB  Bridge with on leg on orange PB x10 (B) Side Lying: Hip extension in abd x10  Hip abd small circles CW/CCW x6 eacj Straight leg hip abd: ER x10, IR x10 Seated: Isometric knee extension 10x3" Timed walking trials for gait speed --> 18' x 3 rounds (12" - 11")   [facility] Adult PT Treatment:                                                DATE: 05/06/23 Therapeutic Exercise: Supine SLR x 2# x 12 reps Sidelying Hip abduction x 12 reps 2#  Hip diagonals x 12 reps 2# Seated Isometric knee extension-- not doing this one at home Standing Sit<>stand x 10 reps Gait: 140 feet with RW mod indep Attempted gait with [facility]-- she uses [facility] in community and [facility] was challenging with SBA-- completed 8 feet and then we switched back to RW Self Care: Discussed HEP and reviewed She is not doing quad stretch b/c of edge of bed, she is doing bridges (updated medbridge to reflect)    [facility] Adult PT Treatment:                                                DATE: 05/02/2023 Therapeutic Exercise: Supine: LTR x10 Hooklying hip add iso ball squeeze 10x5" Bridges + ball b/w knees 2x10 Side Lying: Hip abd straight leg raises --> parallel x10, IR x10 Fwd/Bkwd leg kicks x10 Hip extension on diagonal 2x10 Seated: Knee extension isometric with orange PB 10x5" (B) Ambulating x 2 laps working on hip extension and increasing trunk extension Manual Therapy: Roller stick glutes, lateral quads, ITB   PATIENT EDUCATION:  Education details: Updated HEP Person educated: Patient Education method: Programmer, multimedia, Facilities manager, and Handouts Education comprehension: verbalized understanding, returned demonstration, and needs further  education  HOME EXERCISE PROGRAM: Access Code: [email or web address] URL: [email or web address] Date: 05/06/2023 Prepared by: Margretta Ditty  Exercises - Supine Active Straight Leg Raise  - 1 x daily - 7 x weekly - 1 sets - 10 reps - Sidelying Hip Abduction  - 1 x daily - 7 x weekly - 2 sets - 10 reps - Sidelying Diagonal Hip Abduction  - 1 x daily - 7 x weekly - 2 sets - 10 reps - Supine Bridge  - 2 x daily - 7 x weekly - 1 sets - 10 reps - Sit to Stand with Armchair  - 1 x daily - 7 x weekly - 1 sets - 10 reps - Lateral Step Up with Counter Support  - 1 x daily - 7 x weekly - 3 sets - 10 reps - Forward Step Up with Counter Support  - 1 x daily - 7 x weekly - 3 sets - 10 reps - Side Stepping with Counter Support  - 1 x daily - 7 x weekly - 3 sets - 10 reps  ASSESSMENT:  CLINICAL IMPRESSION: Gait speed progressed with trials of fast-paced walking; patient fatigued after 3 rounds, decreasing time by one second. Hip and glute strengthening exercise continued, providing tactile cues as needed for pelvic stability and postural alignment.  OBJECTIVE IMPAIRMENTS: Abnormal gait, decreased activity tolerance, decreased balance, decreased mobility, decreased strength,  impaired flexibility, obesity, and pain.   GOALS: Goals reviewed with patient? Yes  UPDATED GOALS: SHORT TERM GOALS: Target date: 04/07/23  1.  The patient will report dec'd pain in R leg while driving.  Baseline: Notes pain with being in the car has increased.  Goal status: MET *reports on 03/18/23 that she modified set up using a seat cushion and lumbar support in the car.  LONG TERM GOALS: Target date: 05/07/23  The patient will be indep with progression of HEP. Baseline: doing HEP 1-2x/week Goal status: PARTIALLY MET  2.   The patient will improve gait speed to > or equal to 1.3  ft/sec to demo transition to community ambulation status. Baseline:  1.11 ft/sec  Goal status:  MET-- 1.38 ft/sec on 04/15/23  4.  The  patient will perform 5 time sit<>stand in < or equal to 20 seconds. Baseline: 19 sec Goal status: MET  5.  The patient will report ability to stand at the kitchen counter x 10 minutes without sensation of not being able to lift her legs after standing. Baseline:  Able to lift leg with less pain Goal status: MET On 04/15/23  UPDATED GOALS: SHORT TERM GOALS: Target date: 06/05/23  1.  The patient will report standing tolerance > 15 minutes at kitchen countertop-- will demo in clinic with standing exercise and activities. Baseline: 10 minutes. Goal status: UPDATED  LONG TERM GOALS: Target date: 07/05/23  The patient will be indep with progression of HEP. Baseline: doing HEP 1-2x/week Goal status: UPDATED TARGET DATE  2.   The patient will improve gait speed to > or equal to 1.6  ft/sec to demo improving community ambulation status. Baseline:  1.38 seconds on 04/15/23 Goal status:  UPDATED   3.  The patient will perform 5 time sit<>stand in < or equal to 16 seconds. Baseline: 19 sec Goal status: UPDATED  4.  The patient will report ability to stand at the kitchen counter x 10 minutes without sensation of not being able to lift her legs after standing. Baseline:  Able to lift leg with less pain Goal status:MET On 04/15/23   PLAN:  PT FREQUENCY:1-2x/week  PT DURATION: 8 weeks  PLANNED INTERVENTIONS: Therapeutic exercises, Therapeutic activity, Neuromuscular re-education, Balance training, Gait training, Patient/Family education, Self Care, and Joint mobilization  PLAN FOR NEXT SESSION:   Gait training with [facility], work on standing tolerance, gait with RW working on speed and distance for endurance. Continue LE strengthening at reduced frequency (1x/week) while awaiting next procedures.  Sanjuana Mae, PTA 05/13/2023 2:45 PM

## 2023-05-17 NOTE — Progress Notes (Signed)
Referring Physician(s): Rodney Langton, MD   Reason for follow up:  The patient is seen in virtual telephone follow up today s/p right geniculate artery embolization 04/19/23  History of present illness: HPI from initial consultation  Natalie Wright is a 72 y.o. female with history of severe bilateral knee pain related to osteoarthritis.  Her pertinent comorbidities include obesity, diabetes mellitus, hypertension, obstructive sleep apnea, history of TIA.     She describes her knee pain worst when standing. The pain gets significantly worse when initially lifting leg to ambulate.  Her right knee pain is slightly worse than the left.  She is currently in physical therapy.   Has had series of hyaluronic acid injections with Dr. Benjamin Stain with some improvement but pain persists and is lifestyle limiting.  She is not a candidate for arthroplasty due to elevated body mass index.  Has lost around 120 lbs, hoping to lose about 70 more lbs to get to 200 lbs for TKA.     WOMAC Pain Score:  56/96 VAS Pain Score:  8/10  She expressed interest in pursuing geniculate artery embolization in the hopes it would alleviate her pain and allow her to become more active to continue in her weight loss journey. We discussed risks, benefits, peri-procedural and long-term expectations related to geniculate artery embolization. She was in agreement to proceed starting with the right knee which was more symptomatic.  Her procedure was performed 04/19/23 and she tolerated this well.   She presents for follow up today via virtual tele-health visit.   Past Medical History:  Diagnosis Date   Allergy    to cats and dogs   Anemia    Arthritis    Cataract    Diabetes mellitus    GERD (gastroesophageal reflux disease)    Glaucoma    Hiatal hernia    and delayed gastric emptying/ sees [facility] for recurrent diarrhea   History of cardiovascular disorder 12/10/2008   Qualifier: Diagnosis of  By: Linford Arnold MD,  Catherine     Hypertension    OSA (obstructive sleep apnea)    CPAP   Pedal edema    Sleep apnea    Spastic bladder    urology in [facility]   TIA (transient ischemic attack)    hx of- sees Dr Rayburn Ma (Neurology)    Past Surgical History:  Procedure Laterality Date   CATARACT EXTRACTION  08/2021   IR ANGIOGRAM SELECTIVE EACH ADDITIONAL VESSEL  04/19/2023   IR ANGIOGRAM SELECTIVE EACH ADDITIONAL VESSEL  04/19/2023   IR ANGIOGRAM SELECTIVE EACH ADDITIONAL VESSEL  04/19/2023   IR EMBO ARTERIAL NOT HEMORR HEMANG INC GUIDE ROADMAPPING  04/19/2023   IR RADIOLOGIST EVAL & MGMT  03/08/2023   IR US GUIDE VASC ACCESS RIGHT  04/19/2023   TUBAL LIGATION     vaginal skin tag removal      Allergies: Atorvastatin, Beclomethasone, Cefaclor, Pioglitazone, Pravastatin, Sulfa antibiotics, Cephalexin, Lipitor [atorvastatin calcium], and Statins  Medications: Prior to Admission medications   Medication Sig Start Date End Date Taking? Authorizing Provider  AMBULATORY NON FORMULARY MEDICATION Medication Name: custom fit knee high compression stocking with 15-20 mmHg pressure. Open toe preferred. 08/10/16   Agapito Games, MD  amoxicillin-clavulanate (AUGMENTIN) 875-125 MG tablet Take 1 tablet by mouth 2 (two) times daily. 03/01/23   Agapito Games, MD  aspirin 325 MG tablet Take 325 mg by mouth daily.    [provider]  cetirizine (ZYRTEC) 10 MG tablet Take 10 mg by mouth  daily.    [provider]  chlorthalidone (HYGROTON) 25 MG tablet TAKE 1 TABLET EVERY OTHER DAY 03/20/23   Agapito Games, MD  clopidogrel (PLAVIX) 75 MG tablet Take 1 tablet (75 mg total) by mouth daily. 09/10/22   Agapito Games, MD  diltiazem (CARDIZEM CD) 360 MG 24 hr capsule TAKE 1 CAPSULE(360 MG) BY MOUTH DAILY 09/30/17   Agapito Games, MD  Dulaglutide (TRULICITY) 4.5 MG/0.5ML SOPN Inject 4.5 mg as directed once a week. 02/26/23   Agapito Games, MD  DULoxetine (CYMBALTA) 30  MG capsule Take 1 capsule (30 mg total) by mouth daily. For musculoskeletal pain 12/07/22   Agapito Games, MD  Ferrous Sulfate Dried 200 (65 Fe) MG TABS Take 1 tablet by mouth daily.    [provider]  fluconazole (DIFLUCAN) 150 MG tablet TAKE 1 TABLET BY MOUTH ONCE 09/10/22   Agapito Games, MD  insulin glargine (LANTUS) 100 UNIT/ML injection INJECT 35 UNITS UNDER THE SKIN DAILY 09/10/22   Agapito Games, MD  Insulin Pen Needle (NOVOFINE) 30G X 8 MM MISC For use each time injection for insulin 02/10/16   Agapito Games, MD  JARDIANCE 25 MG TABS tablet TAKE 1 TABLET DAILY 04/08/23   Agapito Games, MD  latanoprost (XALATAN) 0.005 % ophthalmic solution 1 drop.    [provider]  lisinopril (ZESTRIL) 20 MG tablet Take 1 tablet (20 mg total) by mouth daily. 01/21/23   Agapito Games, MD  meloxicam (MOBIC) 15 MG tablet Take 1 tablet (15 mg total) by mouth daily as needed. 01/21/23   [name], MD  metFORMIN (GLUCOPHAGE) 1000 MG tablet Take 1 tablet (1,000 mg total) by mouth 2 (two) times daily with a meal. 01/24/23   Agapito Games, MD  Misc. Devices MISC Transfer of care to [facility].  Please provide service to her current CPAP machine for OSA at 14 cm. water pressure.  Send directly to [place] at [facility]. 05/15/19   [provider]  Omega-3 Fatty Acids (FISH OIL) 1200 MG CAPS Take 1 capsule by mouth daily.    [provider]  pantoprazole (PROTONIX) 40 MG tablet Take 1 tablet (40 mg total) by mouth 2 (two) times daily. 01/22/23   Agapito Games, MD  rosuvastatin (CRESTOR) 10 MG tablet TAKE 1 TABLET ONCE A WEEK AT BEDTIME 03/27/22   Agapito Games, MD  traMADol (ULTRAM) 50 MG tablet Take 1 tablet (50 mg total) by mouth every 8 (eight) hours as needed for moderate pain. 10/08/22   [name], MD     Family History  Problem Relation Age of Onset   Diabetes Mother    Hypertension Mother     Glaucoma Mother    Cancer Mother        bone cancer   Alcohol abuse Father    Emphysema Father    Other Father        CHF   Ovarian cancer Sister    Endometrial cancer Sister        Endometrial stromal sarcoma    Social History   Socioeconomic History   Marital status: Widowed    Spouse name: Glynda Jaeger   Number of children: 2   Years of education: Masters   Highest education level: Master's degree (e.g., MA, MS, MEng, MEd, MSW, MBA)  Occupational History   Occupation: Conservation officer, historic buildings: [employer]    Comment: retired semi  Tobacco Use   Smoking  status: Former    Types: Cigarettes   Smokeless tobacco: Never  Vaping Use   Vaping status: Never Used  Substance and Sexual Activity   Alcohol use: Yes    Alcohol/week: 1.0 standard drink of alcohol    Types: 1 Glasses of wine per week    Comment: social   Drug use: No   Sexual activity: Not Currently    Birth control/protection: None  Other Topics Concern   Not on file  Social History Narrative   Lives alone.   Retired and works 1 day a week for her daughter just to be out and about. Enjoys reading and doing crafts in her free time.   Social Determinants of Health   Financial Resource Strain: Low Risk  (05/03/2023)   Overall Financial Resource Strain (CARDIA)    Difficulty of Paying Living Expenses: Not hard at all  Food Insecurity: No Food Insecurity (05/03/2023)   Hunger Vital Sign    Worried About Running Out of Food in the Last Year: Never true    Ran Out of Food in the Last Year: Never true  Transportation Needs: No Transportation Needs (05/03/2023)   PRAPARE - Administrator, Civil Service (Medical): No    Lack of Transportation (Non-Medical): No  Physical Activity: Inactive (05/03/2023)   Exercise Vital Sign    Days of Exercise per Week: 0 days    Minutes of Exercise per Session: 0 min  Stress: No Stress Concern Present (05/03/2023)   [employer] of Occupational Health - Occupational  Stress Questionnaire    Feeling of Stress : Not at all  Social Connections: Socially Isolated (05/07/2023)   Social Connection and Isolation Panel [NHANES]    Frequency of Communication with Friends and Family: More than three times a week    Frequency of Social Gatherings with Friends and Family: Once a week    Attends Religious Services: Never    Database administrator or Organizations: No    Attends Banker Meetings: Never    Marital Status: Widowed     Vital Signs: There were no vitals taken for this visit.  No physical exam was performed in lieu of virtual telephone visit.    Imaging: Bilateral knee radiographs (08/17/22)  Right, Kellgren and Lawrence Grade 3    Left, Kellgren and Lawrence Grade 3  Labs:  CBC: Recent Labs    07/03/22 1544 04/19/23 0925  WBC 10.1 9.5  HGB 14.5 15.5*  HCT 43.6 47.9*  PLT 369 317    COAGS: Recent Labs    04/19/23 0925  INR 1.0    BMP: Recent Labs    07/03/22 1544 02/26/23 1550 04/19/23 0925  NA 140 139 137  K 4.4 3.5 3.3*  CL 98 96 97*  CO2 32 27 27  GLUCOSE 235* 99 184*  BUN 12 18 19   CALCIUM 9.4 9.0 9.3  CREATININE 0.72 0.77 0.74  GFRNONAA  --   --  >60    LIVER FUNCTION TESTS: Recent Labs    07/03/22 1544 02/26/23 1550  BILITOT 0.4 0.3  AST 17 16  ALT 14 13  ALKPHOS  --  91  PROT 7.0 6.5  ALBUMIN  --  4.2    Assessment and Plan:  72 year old female with a history of severe bilateral knee pain (WOMAC 56/96, VAS 8/10) secondary to advanced (K&G 3) bilateral knee osteoarthritis. She has had a series of hyaluronic acid injections with some improvement but her pain was lifestyle limiting.  She wanted to pursue geniculate artery embolization to allow her to be more active and reach her weight loss goals. She was found to be a great candidate for GAE and her right knee was treated 04/19/23.   Electronically Signed: Mickie Kay 05/17/2023, 12:44 PM   I spent a total of 25 Minutes in  virtual telephone clinical consultation, greater than 50% of which was counseling/coordinating care for bilateral knee pain.

## 2023-05-17 NOTE — Progress Notes (Signed)
Medical screening examination/treatment was performed by qualified clinical staff member and as supervising physician I was immediately available for consultation/collaboration. I have reviewed documentation and agree with assessment and plan.  Sabina Beavers, MD  

## 2023-05-20 ENCOUNTER — Inpatient Hospital Stay
Admission: RE | Admit: 2023-05-20 | Discharge: 2023-05-20 | Disposition: A | Discharge status: Home / Self Care | Payer: Medicare Other | Source: Ambulatory Visit | Attending: Interventional Radiology

## 2023-05-20 DIAGNOSIS — M1711 Unilateral primary osteoarthritis, right knee: Secondary | ICD-10-CM

## 2023-05-20 HISTORY — PX: IR RADIOLOGIST EVAL & MGMT: IMG5224

## 2023-05-21 NOTE — Discharge Instructions (Signed)
Post Procedure Spinal Discharge Instruction Sheet  You may resume a regular diet and any medications that you routinely take (including pain medications) unless otherwise noted by MD.  No driving day of procedure.  Light activity throughout the rest of the day.  Do not do any strenuous work, exercise, bending or lifting.  The day following the procedure, you can resume normal physical activity but you should refrain from exercising or physical therapy for at least three days thereafter.  You may apply ice to the injection site, 20 minutes on, 20 minutes off, as needed. Do not apply ice directly to skin.    Common Side Effects:  Headaches- take your usual medications as directed by your physician.  Increase your fluid intake.  Caffeinated beverages may be helpful.  Lie flat in bed until your headache resolves.  Restlessness or inability to sleep- you may have trouble sleeping for the next few days.  Ask your referring physician if you need any medication for sleep.  Facial flushing or redness- should subside within a few days.  Increased pain- a temporary increase in pain a day or two following your procedure is not unusual.  Take your pain medication as prescribed by your referring physician.  Leg cramps  Please contact our office at [phone] for the following symptoms: Fever greater than 100 degrees. Headaches unresolved with medication after 2-3 days. Increased swelling, pain, or redness at injection site.   Thank you for visiting [facility] today.   YOU MAY RESUME YOUR ASPIRIN AND PLAVIX TODAY.

## 2023-05-22 ENCOUNTER — Ambulatory Visit
Admission: RE | Admit: 2023-05-22 | Discharge: 2023-05-22 | Disposition: A | Discharge status: Home / Self Care | Payer: Medicare Other | Source: Ambulatory Visit | Attending: Sports Medicine | Admitting: Sports Medicine

## 2023-05-22 DIAGNOSIS — M4727 Other spondylosis with radiculopathy, lumbosacral region: Secondary | ICD-10-CM | POA: Diagnosis not present

## 2023-05-22 DIAGNOSIS — M48061 Spinal stenosis, lumbar region without neurogenic claudication: Secondary | ICD-10-CM

## 2023-05-22 MED ORDER — IOPAMIDOL (ISOVUE-M 200) INJECTION 41%
1.0000 mL | Freq: Once | INTRAMUSCULAR | Status: AC
  Administered 2023-05-22: 1 mL via EPIDURAL

## 2023-05-22 MED ORDER — METHYLPREDNISOLONE ACETATE 40 MG/ML INJ SUSP (RADIOLOG
80.0000 mg | Freq: Once | INTRAMUSCULAR | Status: AC
  Administered 2023-05-22: 80 mg via EPIDURAL

## 2023-05-23 ENCOUNTER — Ambulatory Visit: Payer: Medicare Other

## 2023-05-24 ENCOUNTER — Other Ambulatory Visit: Payer: Self-pay | Admitting: Family Medicine

## 2023-05-24 DIAGNOSIS — K209 Esophagitis, unspecified without bleeding: Secondary | ICD-10-CM | POA: Diagnosis not present

## 2023-05-24 DIAGNOSIS — K219 Gastro-esophageal reflux disease without esophagitis: Secondary | ICD-10-CM | POA: Diagnosis not present

## 2023-05-24 DIAGNOSIS — K8681 Exocrine pancreatic insufficiency: Secondary | ICD-10-CM | POA: Diagnosis not present

## 2023-05-24 DIAGNOSIS — R197 Diarrhea, unspecified: Secondary | ICD-10-CM | POA: Diagnosis not present

## 2023-05-24 DIAGNOSIS — I10 Essential (primary) hypertension: Secondary | ICD-10-CM

## 2023-05-24 DIAGNOSIS — R1084 Generalized abdominal pain: Secondary | ICD-10-CM | POA: Diagnosis not present

## 2023-05-24 DIAGNOSIS — R112 Nausea with vomiting, unspecified: Secondary | ICD-10-CM | POA: Diagnosis not present

## 2023-05-28 ENCOUNTER — Other Ambulatory Visit: Payer: Self-pay | Admitting: Interventional Radiology

## 2023-05-28 DIAGNOSIS — M1712 Unilateral primary osteoarthritis, left knee: Secondary | ICD-10-CM

## 2023-06-03 ENCOUNTER — Other Ambulatory Visit: Payer: Self-pay | Admitting: Family Medicine

## 2023-06-03 DIAGNOSIS — Z0000 Encounter for general adult medical examination without abnormal findings: Secondary | ICD-10-CM

## 2023-06-04 ENCOUNTER — Ambulatory Visit (INDEPENDENT_AMBULATORY_CARE_PROVIDER_SITE_OTHER): Payer: Medicare Other | Admitting: Family Medicine

## 2023-06-04 ENCOUNTER — Telehealth: Payer: Self-pay | Admitting: Family Medicine

## 2023-06-04 ENCOUNTER — Encounter: Payer: Self-pay | Admitting: Family Medicine

## 2023-06-04 VITALS — BP 105/30 | HR 62 | Temp 97.9°F | Ht 61.0 in | Wt 283.0 lb

## 2023-06-04 DIAGNOSIS — R32 Unspecified urinary incontinence: Secondary | ICD-10-CM

## 2023-06-04 DIAGNOSIS — I10 Essential (primary) hypertension: Secondary | ICD-10-CM | POA: Diagnosis not present

## 2023-06-04 DIAGNOSIS — E1149 Type 2 diabetes mellitus with other diabetic neurological complication: Secondary | ICD-10-CM

## 2023-06-04 DIAGNOSIS — Z6841 Body Mass Index (BMI) 40.0 and over, adult: Secondary | ICD-10-CM | POA: Diagnosis not present

## 2023-06-04 DIAGNOSIS — Z23 Encounter for immunization: Secondary | ICD-10-CM

## 2023-06-04 DIAGNOSIS — H6123 Impacted cerumen, bilateral: Secondary | ICD-10-CM

## 2023-06-04 DIAGNOSIS — Z7984 Long term (current) use of oral hypoglycemic drugs: Secondary | ICD-10-CM

## 2023-06-04 LAB — POCT GLYCOSYLATED HEMOGLOBIN (HGB A1C): Hemoglobin A1C: 8.9 % — AB (ref 4.0–5.6)

## 2023-06-04 MED ORDER — TIRZEPATIDE 10 MG/0.5ML ~~LOC~~ SOAJ
10.0000 mg | SUBCUTANEOUS | 0 refills | Status: DC
Start: 2023-06-04 — End: 2023-06-11

## 2023-06-04 NOTE — Assessment & Plan Note (Signed)
Well controlled. Continue current regimen. Follow up in  6 mo  

## 2023-06-04 NOTE — Progress Notes (Signed)
Established Patient Office Visit  Subjective   Patient ID: Natalie Wright, female    DOB: [birth year]  Age: 72 y.o. MRN: [id number]  Chief Complaint  Patient presents with   Diabetes   Hypertension    HPI   Diabetes - no hypoglycemic events. No wounds or sores that are not healing well. No increased thirst or urination. Checking glucose at home. Taking medications as prescribed without any side effects.  She reports she has not been the best with her diet lately it is the 1 year anniversary of the death of her husband's that he is really been struggling emotionally.  Though she does feel like she has good support.  She is taking her Jardiance.  She has noticed some increased incontinence that started more suddenly she said it was a problem for a while then got better but it is now to the point where she drips and leaks as soon as she gets the urge to go no dysuria or hematuria.  Hypertension- Pt denies chest pain, SOB, dizziness, or heart palpitations.  Taking meds as directed w/o problems.  Denies medication side effects.    Still doing physical therapy.  She has had to take a break for a few weeks in between injections she did get a nerve block on her right knee and is scheduled on Friday for a nerve block on her left knee but plans on getting back in the PT next week.  She also recently had a shot in her spine.  Because of her pancreatic insufficiency she does tend to have diarrhea.     ROS    Objective:     BP (!) 105/30   Pulse 62   Ht 5\' 1"  (1.549 m)   Wt 283 lb (128.4 kg)   SpO2 99%   BMI 53.47 kg/m    Physical Exam Vitals and nursing note reviewed.  Constitutional:      Appearance: Normal appearance.  HENT:     Head: Normocephalic and atraumatic.  Eyes:     Conjunctiva/sclera: Conjunctivae normal.  Cardiovascular:     Rate and Rhythm: Normal rate and regular rhythm.  Pulmonary:     Effort: Pulmonary effort is normal.     Breath sounds: Normal breath  sounds.  Skin:    General: Skin is warm and dry.  Neurological:     Mental Status: She is alert.  Psychiatric:        Mood and Affect: Mood normal.      Results for orders placed or performed in visit on 06/04/23  POCT HgB A1C  Result Value Ref Range   Hemoglobin A1C 8.9 (A) 4.0 - 5.6 %   HbA1c POC (<> result, manual entry)     HbA1c, POC (prediabetic range)     HbA1c, POC (controlled diabetic range)        The 10-year ASCVD risk score (Arnett DK, et al., 2019) is: 16.7%    Assessment & Plan:   Problem List Items Addressed This Visit       Cardiovascular and Mediastinum   Essential hypertension, benign    Well controlled. Continue current regimen. Follow up in  57mo         Endocrine   Type 2 diabetes mellitus with neurological complications (HCC) - Primary    We discussed options.  Like to try switching her to [facility].  She has done pretty well with the Trulicity and has lost over 100 pounds.  But her A1c is up and  she really feels like she is plateaued recently her goal is to get down to about 200 pounds so that she can have her knee replacement surgery.  We discussed switching to the [facility].  Monitor for increased symptoms including constipation and nausea.  Will likely need prior authorization with the insurance.  If not covered then we can always go back to Trulicity 4.5 mg which is covered.  BMI 53.      Relevant Medications   tirzepatide (MOUNJARO) 10 MG/0.5ML Pen   Other Relevant Orders   POCT HgB A1C (Completed)     Other   URINARY INCONTINENCE   Relevant Orders   Urinalysis, Routine w reflex microscopic   Urine Culture   BMI 50.0-59.9, adult (HCC)    BMI is down to 53.  She is lost well over 100 pounds.  We discussed options including switching to [facility] since she really has plateaued with the Trulicity continue to work on portion control healthy food choices and staying as active as she is able to.      Relevant Medications   tirzepatide  (MOUNJARO) 10 MG/0.5ML Pen   Other Visit Diagnoses     Bilateral impacted cerumen           Indication: Cerumen impaction of the ear(s) Medical necessity statement: On physical examination, cerumen impairs clinically significant portions of the external auditory canal, and tympanic membrane. Noted obstructive, copious cerumen that cannot be removed without magnification  Consent: Discussed benefits and risks of procedure and verbal consent obtained Procedure: Patient was prepped for the procedure. Utilized an otoscope to assess and take note of the ear canal, the tympanic membrane, and the presence, amount, and placement of the cerumen. Gentle water irrigation and soft plastic curette was utilized to remove cerumen.  Post procedure examination: shows cerumen was completely removed. Patient tolerated procedure well. The patient is made aware that they may experience temporary vertigo, temporary hearing loss, and temporary discomfort. If these symptom last for more than 24 hours to call the clinic or proceed to the ED.    No follow-ups on file.    [name], MD

## 2023-06-04 NOTE — Assessment & Plan Note (Signed)
BMI is down to 53.  She is lost well over 100 pounds.  We discussed options including switching to [facility] since she really has plateaued with the Trulicity continue to work on portion control healthy food choices and staying as active as she is able to.

## 2023-06-04 NOTE — Assessment & Plan Note (Signed)
We discussed options.  Like to try switching her to [facility].  She has done pretty well with the Trulicity and has lost over 100 pounds.  But her A1c is up and she really feels like she is plateaued recently her goal is to get down to about 200 pounds so that she can have her knee replacement surgery.  We discussed switching to the [facility].  Monitor for increased symptoms including constipation and nausea.  Will likely need prior authorization with the insurance.  If not covered then we can always go back to Trulicity 4.5 mg which is covered.  BMI 53.

## 2023-06-04 NOTE — Telephone Encounter (Signed)
Hi Natalie Wright, I would really love to get her started on CGM,. She has tricare and is on insulin. Her A1C looked great in August but she has been stress eating ( 1 yr anniversary of husband's death) and now over 8. I am trying to get her changed to [facility].

## 2023-06-05 NOTE — Discharge Instructions (Signed)
Discharge Instructions for Genicular Artery Embolization (GAE)   Post-Procedure Care   Activity:   Rest for the remainder of the day.   Avoid strenuous activities and heavy lifting for 48 hours.   Gradually resume normal activities as tolerated.   Pain Management:   You may experience mild pain or discomfort at the catheter insertion site or in the knee. This is normal.   Use over-the-counter pain relievers such as acetaminophen (Tylenol) or ibuprofen (Advil) as directed.   Apply an ice pack to the knee for 15-20 minutes every 2-3 hours to reduce swelling and discomfort.   Take Sol-Medrol Pack as directed per pharmacy.   Wound Care:   Keep the catheter insertion site clean and dry.   Remove the bandage after 24 hours and replace it with a clean, dry bandage if needed.   Avoid soaking in baths, hot tubs, or swimming pools for 5 days. Showers are allowed.   Medications:   Take prescribed medications as directed.   If you were taking blood thinners, follow your physician's instructions on when to resume them.   Diet:   Resume your normal diet.   Drink plenty of fluids to stay hydrated.   Follow-Up:   Schedule a follow-up appointment with your physician as instructed.   Contact your physician if you experience increased pain, swelling, redness, or drainage at the insertion site, or if you have a fever over 100.14F (38C).   When to Seek Immediate Medical Attention   Call [phone] with any concerns:   Signs of infection at the catheter site (redness, warmth, pus).   Sudden weakness or numbness in the leg.      Call 911 if:   Difficulty breathing or chest pain.   You have severe pain in your abdomen, and it does not get better with medicine.     You have leg pain or leg swelling.   You feel dizzy, or you faint.   Do not wait to see if the symptoms will go away.   Do not drive yourself to the hospital.   Please ensure you follow these instructions  carefully and reach out to your healthcare provider if you have any concerns or questions. Wishing you a smooth and speedy recovery!

## 2023-06-06 ENCOUNTER — Ambulatory Visit (INDEPENDENT_AMBULATORY_CARE_PROVIDER_SITE_OTHER): Payer: Medicare Other | Admitting: Podiatry

## 2023-06-06 ENCOUNTER — Other Ambulatory Visit: Payer: Self-pay

## 2023-06-06 ENCOUNTER — Encounter: Payer: Self-pay | Admitting: Podiatry

## 2023-06-06 ENCOUNTER — Telehealth: Payer: Self-pay

## 2023-06-06 DIAGNOSIS — E1149 Type 2 diabetes mellitus with other diabetic neurological complication: Secondary | ICD-10-CM | POA: Diagnosis not present

## 2023-06-06 DIAGNOSIS — R32 Unspecified urinary incontinence: Secondary | ICD-10-CM | POA: Diagnosis not present

## 2023-06-06 DIAGNOSIS — B351 Tinea unguium: Secondary | ICD-10-CM | POA: Diagnosis not present

## 2023-06-06 DIAGNOSIS — M79675 Pain in left toe(s): Secondary | ICD-10-CM | POA: Diagnosis not present

## 2023-06-06 DIAGNOSIS — Z7901 Long term (current) use of anticoagulants: Secondary | ICD-10-CM

## 2023-06-06 DIAGNOSIS — L84 Corns and callosities: Secondary | ICD-10-CM | POA: Diagnosis not present

## 2023-06-06 DIAGNOSIS — M79674 Pain in right toe(s): Secondary | ICD-10-CM | POA: Diagnosis not present

## 2023-06-06 MED ORDER — METHYLPREDNISOLONE 4 MG PO TBPK: ORAL_TABLET | ORAL | 0 refills | Status: DC

## 2023-06-06 MED ORDER — DEXCOM G7 SENSOR MISC: 4 refills | Status: DC

## 2023-06-06 MED ORDER — DEXCOM G7 RECEIVER DEVI: 0 refills | Status: DC

## 2023-06-06 NOTE — Progress Notes (Signed)
   06/06/2023  Patient ID: Natalie Wright, female   DOB: 08/12/1950, 72 y.o.   MRN: [id number]  Sending order for [facility] G7 Receiver and Sensors under [facility] CGM standing order.  Lenna Gilford, PharmD, DPLA

## 2023-06-06 NOTE — Telephone Encounter (Signed)
Pt. Was called and reminded about upcoming GAE procedure on 06/07/23 and told that medrol pak was called in to [facility]. It is to be taken as prescribed after her GAE procedure.

## 2023-06-06 NOTE — Progress Notes (Signed)
   06/06/2023  Patient ID: Natalie Wright, female   DOB: 07/24/1950, 72 y.o.   MRN: [id number]  Clinic routed request from patient's PCP, Dr. Linford Arnold, to assist with insurance coverage of [facility] 10mg  weekly and CGM.  Per [employer], Greggory Keen 10mg  weekly and Dexcom G7 Reader/Sensors would be covered with a prior authorization.  I have submitted PA requests to the insurance company for these prescriptions and will keep Dr. Linford Arnold informed of response.  Lenna Gilford, PharmD, DPLA

## 2023-06-07 ENCOUNTER — Ambulatory Visit
Admission: RE | Admit: 2023-06-07 | Discharge: 2023-06-07 | Disposition: A | Discharge status: Home / Self Care | Payer: Medicare Other | Source: Ambulatory Visit | Attending: Interventional Radiology

## 2023-06-07 DIAGNOSIS — M1712 Unilateral primary osteoarthritis, left knee: Secondary | ICD-10-CM

## 2023-06-07 DIAGNOSIS — M25562 Pain in left knee: Secondary | ICD-10-CM | POA: Diagnosis not present

## 2023-06-07 DIAGNOSIS — M25561 Pain in right knee: Secondary | ICD-10-CM | POA: Diagnosis not present

## 2023-06-07 DIAGNOSIS — M170 Bilateral primary osteoarthritis of knee: Secondary | ICD-10-CM | POA: Diagnosis not present

## 2023-06-07 HISTORY — PX: IR EMBO ARTERIAL NOT HEMORR HEMANG INC GUIDE ROADMAPPING: IMG5448

## 2023-06-07 LAB — URINALYSIS, ROUTINE W REFLEX MICROSCOPIC
Bilirubin, UA: NEGATIVE
Ketones, UA: NEGATIVE
Leukocytes,UA: NEGATIVE
Nitrite, UA: NEGATIVE
Protein,UA: NEGATIVE
RBC, UA: NEGATIVE
Specific Gravity, UA: >=1.03 — AB (ref 1.005–1.030)
Urobilinogen, Ur: 0.2 mg/dL (ref 0.2–1.0)
pH, UA: 5 (ref 5.0–7.5)

## 2023-06-07 MED ORDER — FENTANYL CITRATE PF 50 MCG/ML IJ SOSY: 25.0000 ug | PREFILLED_SYRINGE | INTRAMUSCULAR | Status: DC | PRN

## 2023-06-07 MED ORDER — ACETAMINOPHEN 10 MG/ML IV SOLN
1000.0000 mg | Freq: Once | INTRAVENOUS | Status: AC
  Administered 2023-06-07: 1000 mg via INTRAVENOUS

## 2023-06-07 MED ORDER — KETOROLAC TROMETHAMINE 30 MG/ML IJ SOLN
30.0000 mg | INTRAMUSCULAR | Status: AC
  Administered 2023-06-07: 30 mg via INTRAVENOUS

## 2023-06-07 MED ORDER — FENTANYL CITRATE (PF) 100 MCG/2ML IJ SOLN
INTRAMUSCULAR | Status: AC | PRN
Start: 2023-06-07 — End: 2023-06-07
  Administered 2023-06-07 (×2): 50 ug via INTRAVENOUS

## 2023-06-07 MED ORDER — MIDAZOLAM HCL 2 MG/2ML IJ SOLN: 1.0000 mg | INTRAMUSCULAR | Status: DC | PRN

## 2023-06-07 MED ORDER — MIDAZOLAM HCL 2 MG/2ML IJ SOLN
INTRAMUSCULAR | Status: AC | PRN
  Administered 2023-06-07 (×2): 1 mg via INTRAVENOUS

## 2023-06-07 MED ORDER — SODIUM CHLORIDE 0.9 % IV SOLN
INTRAVENOUS | Status: DC
  Administered 2023-06-07: 250 mL via INTRAVENOUS

## 2023-06-07 MED ORDER — IOPAMIDOL (ISOVUE-300) INJECTION 61%
55.0000 mL | Freq: Once | INTRAVENOUS | Status: AC | PRN
  Administered 2023-06-07: 55 mL via INTRA_ARTERIAL

## 2023-06-07 MED ORDER — SODIUM CHLORIDE 0.9 % IV SOLN
10.0000 mg | Freq: Once | INTRAVENOUS | Status: AC
  Administered 2023-06-07: 10 mg via INTRAVENOUS

## 2023-06-07 NOTE — Progress Notes (Signed)
   06/07/2023  Patient ID: Cristi Loron, female   DOB: 1950-12-23, 72 y.o.   MRN: [id number]  Received notification that prior authorization for Greggory Keen has been approved.  Contacted [facility] to check on copay for this and for [facility] G7 receiver and sensors sent in yesterday.  Greggory Keen is going through for $76 copay; this would be the same for a 1 or 3 month supply.  Dexcom is going through for $38 for a 90 day supply of sensors and 1 receiver.  Attempted to contact patient to see if these prescription copays will be affordable for her, but I was not able to reach her.  I did leave HIPAA compliant voicemail with my direct phone number, though.  Lenna Gilford, PharmD, DPLA

## 2023-06-07 NOTE — Procedures (Signed)
Interventional Radiology Procedure Note  Procedure: Left geniculate artery embolization   Findings: Please refer to procedural dictation for full description. 0.8 mL 250 micron Embozene to articular branch of left descending geniculate artery.  Left distal anterior tibial artery access, 4 Fr, manual compression.  Complications: none immediate  Estimated Blood Loss: < 5 ml  Recommendations: 1 hour bedrest. IR will arrange 1 month follow up in clinic.   Marliss Coots, MD

## 2023-06-07 NOTE — H&P (Signed)
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 She has had a series of hyaluronic acid injections with some improvement but her pain was lifestyle limiting. She wanted to pursue geniculate artery embolization to allow her to be more active and reach her weight loss goals. She was found to be a great candidate for GAE and her right knee was treated 04/19/23. She has experienced remarkable pain relief and is interested in having the left knee treated.  No changes in symptoms since last visit.  Feels well today.  Past Medical History:  Diagnosis Date   Allergy    to cats and dogs   Anemia    Arthritis    Cataract    Diabetes mellitus    GERD (gastroesophageal reflux disease)    Glaucoma    Hiatal hernia    and delayed gastric emptying/ sees [facility] for recurrent diarrhea   History of cardiovascular disorder 12/10/2008   Qualifier: Diagnosis of  By: Linford Arnold MD, Catherine     Hypertension    OSA (obstructive sleep apnea)    CPAP   Pedal edema    Sleep apnea    Spastic bladder    urology in [facility]   TIA (transient ischemic attack)    hx of- sees Dr Rayburn Ma (Neurology)    Past Surgical History:  Procedure Laterality Date   CATARACT EXTRACTION  08/2021   IR ANGIOGRAM SELECTIVE EACH ADDITIONAL VESSEL  04/19/2023   IR ANGIOGRAM SELECTIVE EACH ADDITIONAL VESSEL  04/19/2023   IR ANGIOGRAM SELECTIVE EACH ADDITIONAL VESSEL  04/19/2023   IR EMBO ARTERIAL NOT HEMORR HEMANG INC GUIDE ROADMAPPING  04/19/2023   IR RADIOLOGIST EVAL & MGMT  03/08/2023   IR RADIOLOGIST EVAL & MGMT  05/20/2023   IR US GUIDE VASC ACCESS RIGHT  04/19/2023   TUBAL LIGATION     vaginal skin tag removal      Allergies: Atorvastatin, Beclomethasone, Cefaclor, Pioglitazone, Pravastatin, Sulfa antibiotics, Cephalexin, Lipitor  [atorvastatin calcium], and Statins  Medications: Prior to Admission medications   Medication Sig Start Date End Date Taking? Authorizing Provider  AMBULATORY NON FORMULARY MEDICATION Medication Name: custom fit knee high compression stocking with 15-20 mmHg pressure. Open toe preferred. 08/10/16   Agapito Games, MD  aspirin 325 MG tablet Take 325 mg by mouth daily.    [provider]  cetirizine (ZYRTEC) 10 MG tablet Take 10 mg by mouth daily.    [provider]  chlorthalidone (HYGROTON) 25 MG tablet TAKE 1 TABLET EVERY OTHER DAY 03/20/23   Agapito Games, MD  clopidogrel (PLAVIX) 75 MG tablet Take 1 tablet (75 mg total) by mouth daily. 09/10/22   Agapito Games, MD  Continuous Glucose Receiver (DEXCOM G7 RECEIVER) DEVI Use for continuous glucose monitoring 06/06/23   Agapito Games, MD  Continuous Glucose Sensor (DEXCOM G7 SENSOR) MISC Change sensor every 10 days 06/06/23   Agapito Games, MD  diltiazem (CARDIZEM CD) 360 MG 24 hr capsule TAKE 1 CAPSULE(360 MG) BY MOUTH DAILY 09/30/17   Agapito Games, MD  DULoxetine (CYMBALTA) 30 MG capsule Take 1 capsule (30 mg total) by mouth daily. For musculoskeletal pain 12/07/22   Agapito Games, MD  Ferrous Sulfate Dried 200 (65 Fe) MG TABS Take 1 tablet by mouth daily.    [provider]  fluconazole (DIFLUCAN) 150 MG  tablet TAKE 1 TABLET BY MOUTH ONCE 09/10/22   Agapito Games, MD  insulin glargine (LANTUS) 100 UNIT/ML injection INJECT 35 UNITS UNDER THE SKIN DAILY 09/10/22   Agapito Games, MD  Insulin Pen Needle (NOVOFINE) 30G X 8 MM MISC For use each time injection for insulin 02/10/16   Agapito Games, MD  JARDIANCE 25 MG TABS tablet TAKE 1 TABLET DAILY 04/08/23   Agapito Games, MD  latanoprost (XALATAN) 0.005 % ophthalmic solution 1 drop.    [provider]  lisinopril (ZESTRIL) 20 MG tablet Take 1 tablet (20 mg total) by mouth daily. 01/21/23    Agapito Games, MD  meloxicam (MOBIC) 15 MG tablet Take 1 tablet (15 mg total) by mouth daily as needed. 01/21/23   [name], MD  metFORMIN (GLUCOPHAGE) 1000 MG tablet Take 1 tablet (1,000 mg total) by mouth 2 (two) times daily with a meal. 01/24/23   Agapito Games, MD  methylPREDNISolone (MEDROL DOSEPAK) 4 MG TBPK tablet Taper Per Instructions 06/06/23   Bennie Dallas, MD  Misc. Devices MISC Transfer of care to [facility] [employer] [facility].  Please provide service to her current CPAP machine for OSA at 14 cm. water pressure.  Send directly to [facility] at [facility] [employer]. 05/15/19   [provider]  Omega-3 Fatty Acids (FISH OIL) 1200 MG CAPS Take 1 capsule by mouth daily.    [provider]  pantoprazole (PROTONIX) 40 MG tablet Take 1 tablet (40 mg total) by mouth 2 (two) times daily. 01/22/23   Agapito Games, MD  rosuvastatin (CRESTOR) 10 MG tablet TAKE 1 TABLET ONCE A WEEK AT BEDTIME 03/27/22   Agapito Games, MD  tirzepatide [facility]) 10 MG/0.5ML Pen Inject 10 mg into the skin once a week. 06/04/23   Agapito Games, MD  traMADol (ULTRAM) 50 MG tablet Take 1 tablet (50 mg total) by mouth every 8 (eight) hours as needed for moderate pain. 10/08/22   [name], MD     Family History  Problem Relation Age of Onset   Diabetes Mother    Hypertension Mother    Glaucoma Mother    Cancer Mother        bone cancer   Alcohol abuse Father    Emphysema Father    Other Father        CHF   Ovarian cancer Sister    Endometrial cancer Sister        Endometrial stromal sarcoma    Social History   Socioeconomic History   Marital status: Widowed    Spouse name: Glynda Jaeger   Number of children: 2   Years of education: Masters   Highest education level: Master's degree (e.g., MA, MS, MEng, MEd, MSW, MBA)  Occupational History   Occupation: Conservation officer, 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   Retired and works 1 day a week for her daughter just to  be out and about. Enjoys reading and doing crafts in her free time.   Social Determinants of Health   Financial Resource Strain: Low Risk  (05/31/2023)   Overall Financial Resource Strain (CARDIA)    Difficulty of Paying Living Expenses: Not hard at all  Food Insecurity: No Food Insecurity (05/31/2023)   Hunger Vital Sign    Worried About Running Out of Food in the Last Year: Never true    Ran Out of Food in the Last Year: Never true  Transportation Needs: No Transportation Needs (05/31/2023)   PRAPARE - Administrator, Civil Service (Medical): No    Lack of Transportation (Non-Medical): No  Physical Activity: Inactive (05/31/2023)   Exercise Vital Sign    Days of Exercise per Week: 7 days    Minutes of Exercise per Session: 0 min  Stress: No Stress Concern Present (05/31/2023)   [employer] of Occupational Health - Occupational Stress Questionnaire    Feeling of Stress : Not at all  Social Connections: Socially Isolated (05/31/2023)   Social Connection and Isolation Panel [NHANES]    Frequency of Communication with Friends and Family: More than three times a week    Frequency of Social Gatherings with Friends and Family: Once a week    Attends Religious Services: Never    Database administrator or Organizations: No    Attends Banker Meetings: Never    Marital Status: Widowed    Review of Systems: A 12 point ROS discussed and pertinent positives are indicated in the HPI above.  All other systems are  negative.  Vital Signs: BP (!) 153/52 (BP Location: Right Arm, Patient Position: Sitting)   Pulse 62   Temp 98.6 F (37 C) (Oral)   Resp 18   SpO2 95%    Physical Exam Constitutional:      General: She is not in acute distress. HENT:     Head: Normocephalic.     Mouth/Throat:     Mouth: Mucous membranes are moist.     Comments:  MP2 Eyes:     General: No scleral icterus. Cardiovascular:     Rate and Rhythm: Normal rate and regular rhythm.  Pulmonary:     Effort: Pulmonary effort is normal. No respiratory distress.  Abdominal:     General: There is no distension.  Musculoskeletal:     Right lower leg: No edema.     Left lower leg: No edema.  Skin:    General: Skin is warm and dry.     Findings: No erythema.  Neurological:     Mental Status: She is alert and oriented to person, place, and time.     Imaging:  Left, Kellgren and Lawrence Grade 3  Labs:  CBC: Recent Labs    07/03/22 1544 04/19/23 0925  WBC 10.1 9.5  HGB 14.5 15.5*  HCT 43.6 47.9*  PLT 369 317    COAGS: Recent Labs    04/19/23 0925  INR 1.0    BMP: Recent Labs    07/03/22 1544 02/26/23 1550 04/19/23 0925  NA 140 139 137  K 4.4 3.5 3.3*  CL 98 96 97*  CO2 32 27 27  GLUCOSE 235* 99 184*  BUN 12 18 19   CALCIUM 9.4 9.0 9.3  CREATININE 0.72 0.77 0.74  GFRNONAA  --   --  >60    LIVER FUNCTION TESTS: Recent Labs    07/03/22 1544 02/26/23 1550  BILITOT 0.4 0.3  AST 17 16  ALT 14 13  ALKPHOS  --  91  PROT 7.0 6.5  ALBUMIN  --  4.2    TUMOR MARKERS: No results for input(s): "AFPTM", "CEA", "CA199", "CHROMGRNA" in the last 8760 hours.  Assessment and Plan: 73 year old female with a history of severe bilateral knee pain (WOMAC 56/96, VAS 8/10) secondary to advanced (K&G 3) bilateral knee osteoarthritis. She has had a series of hyaluronic acid injections with some improvement but her pain was lifestyle limiting. She wanted to pursue geniculate artery embolization to allow  her to be more active and reach her weight loss goals. She was found to be a great candidate for GAE and her right knee was treated 04/19/23. She has experienced remarkable pain relief and is interested in having the left knee treated.    Plan for left geniculate artery embolization with moderate sedation.   Electronically Signed: Bennie Dallas, MD 06/07/2023, 12:10 PM

## 2023-06-08 LAB — URINE CULTURE

## 2023-06-09 NOTE — Progress Notes (Signed)
Subjective: Chief Complaint  Patient presents with   Nail Problem    RM#11 DFC    72 y.o. returns the office today for painful, elongated, thickened toenails which they cannot trim herself.  She denies any open sores.  No open lesions.  She has no other concerns today.  She is on Plavix.  PCP: Agapito Games, MD Last seen 06/04/2023 A1c: 8.9 on June 04, 2023  Objective: AAO 3, NAD DP/PT pulses palpable, CRT less than 3 seconds Protective sensation decreased with Simms Weinstein monofilament Nails hypertrophic, dystrophic, elongated, brittle, discolored 10. There is tenderness overlying the nails 1-5 bilaterally. There is no surrounding erythema or drainage along the nail sites. Hyperkeratotic lesion right foot submetatarsal 5, metatarsal base as well as the heel.  No ulcerations noted. No other areas of discomfort. Decrease in arch upon weightbearing.  No open lesions or other pre-ulcerative lesions are identified. No pain with calf compression, swelling, warmth, erythema.  Assessment: Patient presents with symptomatic onychomycosis, hyperkeratotic lesions  Plan: -Treatment options including alternatives, risks, complications were discussed -Nails sharply debrided 10 without complication/bleeding. -Hyperkeratotic lesion sharply debrided x 4 without any complications or bleeding.  Continue with moisturizer daily, offloading. -Discussed daily foot inspection. If there are any changes, to call the office immediately.  -Follow-up in 3 months or sooner if any problems are to arise. In the meantime, encouraged to call the office with any questions, concerns, changes symptoms.  Ovid Curd, DPM

## 2023-06-10 ENCOUNTER — Encounter: Payer: Self-pay | Admitting: Family Medicine

## 2023-06-10 DIAGNOSIS — E1149 Type 2 diabetes mellitus with other diabetic neurological complication: Secondary | ICD-10-CM

## 2023-06-10 NOTE — Progress Notes (Signed)
HI [place], no UTI.  Culture is considered negative. Just showed skin bacteria

## 2023-06-11 MED ORDER — TIRZEPATIDE 10 MG/0.5ML ~~LOC~~ SOAJ: 10.0000 mg | SUBCUTANEOUS | 0 refills | Status: DC

## 2023-06-17 ENCOUNTER — Ambulatory Visit: Payer: Medicare Other | Attending: Sports Medicine

## 2023-06-17 DIAGNOSIS — G8929 Other chronic pain: Secondary | ICD-10-CM | POA: Insufficient documentation

## 2023-06-17 DIAGNOSIS — M25562 Pain in left knee: Secondary | ICD-10-CM | POA: Insufficient documentation

## 2023-06-17 DIAGNOSIS — M25561 Pain in right knee: Secondary | ICD-10-CM | POA: Insufficient documentation

## 2023-06-17 DIAGNOSIS — R2689 Other abnormalities of gait and mobility: Secondary | ICD-10-CM | POA: Diagnosis present

## 2023-06-17 DIAGNOSIS — M170 Bilateral primary osteoarthritis of knee: Secondary | ICD-10-CM | POA: Insufficient documentation

## 2023-06-17 DIAGNOSIS — M6281 Muscle weakness (generalized): Secondary | ICD-10-CM | POA: Diagnosis not present

## 2023-06-17 NOTE — Therapy (Signed)
OUTPATIENT PHYSICAL THERAPY LOWER EXTREMITY TREATMENT    Patient Name: Natalie Wright MRN: [id number] DOB:[birth year], 72 y.o., female Today's Date: 06/17/2023 Dates of service: 12-31-22 to 04-09-23  PT End of Session - 06/17/23 1151     Visit Number 29    Number of Visits 35    Date for PT Re-Evaluation 07/05/23    Authorization Type medicare and tricare    Progress Note Due on Visit 30    PT Start Time 1152    PT Stop Time 1233    PT Time Calculation (min) 41 min    Activity Tolerance Patient tolerated treatment well    Behavior During Therapy WFL for tasks assessed/performed             Past Medical History:  Diagnosis Date   Allergy    to cats and dogs   Anemia    Arthritis    Cataract    Diabetes mellitus    GERD (gastroesophageal reflux disease)    Glaucoma    Hiatal hernia    and delayed gastric emptying/ sees [facility] for recurrent diarrhea   History of cardiovascular disorder 12/10/2008   Qualifier: Diagnosis of  By: Linford Arnold MD, Catherine     Hypertension    OSA (obstructive sleep apnea)    CPAP   Pedal edema    Sleep apnea    Spastic bladder    urology in [place]   TIA (transient ischemic attack)    hx of- sees Dr Rayburn Ma (Neurology)   Past Surgical History:  Procedure Laterality Date   CATARACT EXTRACTION  08/2021   IR ANGIOGRAM SELECTIVE EACH ADDITIONAL VESSEL  04/19/2023   IR ANGIOGRAM SELECTIVE EACH ADDITIONAL VESSEL  04/19/2023   IR ANGIOGRAM SELECTIVE EACH ADDITIONAL VESSEL  04/19/2023   IR EMBO ARTERIAL NOT HEMORR HEMANG INC GUIDE ROADMAPPING  04/19/2023   IR EMBO ARTERIAL NOT HEMORR HEMANG INC GUIDE ROADMAPPING  06/07/2023   IR RADIOLOGIST EVAL & MGMT  03/08/2023   IR RADIOLOGIST EVAL & MGMT  05/20/2023   IR US GUIDE VASC ACCESS RIGHT  04/19/2023   TUBAL LIGATION     vaginal skin tag removal     Patient Active Problem List   Diagnosis Date Noted   BMI 50.0-59.9, adult (HCC) 06/04/2023   Diabetic retinopathy (HCC) 02/26/2023    Lumbar spondylosis 07/03/2022   Lumbar spinal stenosis 02/26/2022   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/2022   Bilateral hip pain 01/12/2022   Hearing loss due to cerumen impaction, right 01/12/2022   Hammertoe, bilateral 08/23/2021   History of foot ulcer 08/23/2021   Primary osteoarthritis of both knees 12/09/2020   History of burning pain in leg 08/29/2020   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   Tremor 05/05/2020   Pre-ulcerative corn or callous 01/29/2020   History of transient ischemic attack (TIA) 05/15/2018   Family history of ovarian cancer 04/02/2017   Aortic atherosclerosis (HCC) 08/31/2016   Venous stasis 08/10/2016   Palpitations 07/16/2016   Atrial dilatation, left 07/16/2016   Atrial septal aneurysm 07/16/2016   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   Cataract 04/18/2012   Hyperlipidemia 02/13/2011   Pulmonary nodules 09/22/2010   LEG PAIN 09/12/2009   Sleep apnea 04/13/2009   GERD 03/14/2009   Morbid obesity (HCC) 12/10/2008   Essential hypertension, benign 12/10/2008   RAYNAUD'S DISEASE 12/10/2008   Osteoarthritis 12/10/2008   URINARY INCONTINENCE 12/10/2008    PCP: [name], MD REFERRING PROVIDER: [name], MD REFERRING DIAG:  M17.0 (ICD-10-CM) - Primary osteoarthritis of both knees   THERAPY DIAG:  Muscle weakness (generalized)  Chronic pain of right knee  Chronic pain of left knee  Other abnormalities of gait and mobility  Rationale for Evaluation and Treatment: Rehabilitation  ONSET DATE: 12/17/22  SUBJECTIVE:  SUBJECTIVE STATEMENT: Patient reports she has the knee ablation on L knee since last PT visit. Patient states the pain is better and overall there is improvement but continues to have pain and "slippage" in knee joints; states she has no knee pain when getting in/out of car. Patient has been using FWW more when going out into the community instead of wheelchair. Patient has gel  injections scheduled on Thursday 12/19. Patient states she slipped on some water inside her house, reports bruising around L hip from the fall.  PERTINENT HISTORY: Diabetes, HTN, TIA, spinal stenosis, polymyalgia rheumatica PAIN:  Are you having pain? Yes: NPRS scale: 5 /10 Pain location: back (hip to hip) and knees Pain description: "snap, crackle, pop" pain, instability at knees where they give out, and neuropathy in her feet Aggravating factors: trying to move after standing, pivoting to step  Relieving factors: nothing  PRECAUTIONS: Fall  WEIGHT BEARING RESTRICTIONS: No  FALLS:  Has patient fallen in last 6 months? Yes. Number of falls 1  PATIENT GOALS: "I'm not sure. I don't know how much therapy will help before knee surgery."  Be able to get in and out of her car easier-- right now, it is so slow and a long process.   OBJECTIVE:  (Measures in this section from initial evaluation unless otherwise noted) PATIENT SURVEYS:  FOTO n/a-- mobility focus SENSATION: Notes neuropathy with some burning in feet, dec'd sensation EDEMA:  Bilateral swelling noted in LEs  LOWER EXTREMITY ROM: Active ROM Right eval Left eval Right 04/05/23 Left 04/05/23  Hip flexion      Hip extension      Hip abduction      Hip adduction      Hip internal rotation      Hip external rotation      Knee flexion 110 110 111 105  Knee extension 0 0    Ankle dorsiflexion      Ankle plantarflexion      Ankle inversion      Ankle eversion       (Blank rows = not tested)  LOWER EXTREMITY MMT: *can lift against gravity MMT Right eval Left eval Right 02/18/23 Left 02/18/23 Right 04/05/23 Left 04/05/23  Hip flexion 3/5 3/5 4+/5 4+/5 4+/5 4+/5  Hip extension        Hip abduction        Hip adduction        Hip internal rotation        Hip external rotation        Knee flexion 3/5 3/5 5/5 5/5    Knee extension 3/5 3/5 5/5 5/5    Ankle dorsiflexion 4/5 4/5      Ankle plantarflexion 3/5 3/5       Ankle inversion        Ankle eversion         (Blank rows = not tested) FUNCTIONAL TESTS:  5 time sit to stand=56.61  Gait speed=0.302 ft/sec *significantly slowed speed* GAIT: Distance walked: with [facility] slowed pace x 75 feet requiring increased time Assistive device utilized: Quad cane large base Level of assistance: Modified independence  slowed pace Comments: patient's quad cane is too high and she switches R  and L sides-- PT to address Shoewear: Patient has to use slip on shoes and these slide during gait activities-- PT to address   [facility] Adult PT Treatment:                                                DATE: 06/17/2023 Therapeutic Exercise: STS 2x5 (updated time in LTGs) Standing at counter: Hip abd x10 (B) Hip ext x10 (B) Seated: Isometric knee extension with orange PB x10 (B) Isometric knee flexion with green bolster x 10 (B) Therapeutic Activity: Walking with focus on gait speed --> fast/normal paced intervals x80', x160', x80'    [facility] Adult PT Treatment:                                                DATE: 05/13/2023 Therapeutic Exercise: Supine: Heel slides --> knee flexion AROM orange PB  Bridge with on leg on orange PB x10 (B) Side Lying: Hip extension in abd x10  Hip abd small circles CW/CCW x6 eacj Straight leg hip abd: ER x10, IR x10 Seated: Isometric knee extension 10x3" Timed walking trials for gait speed --> 18' x 3 rounds (12" - 11")   [facility] Adult PT Treatment:                                                DATE: 05/06/23 Therapeutic Exercise: Supine SLR x 2# x 12 reps Sidelying Hip abduction x 12 reps 2#  Hip diagonals x 12 reps 2# Seated Isometric knee extension-- not doing this one at home Standing Sit<>stand x 10 reps Gait: 140 feet with RW mod indep Attempted gait with [facility]-- she uses [facility] in community and [facility] was challenging with SBA-- completed 8 feet and then we switched back to RW Self Care: Discussed HEP and reviewed She is not  doing quad stretch b/c of edge of bed, she is doing bridges (updated medbridge to reflect)   PATIENT EDUCATION:  Education details: Updated HEP Person educated: Patient Education method: Explanation, Demonstration, and Handouts Education comprehension: verbalized understanding, returned demonstration, and needs further education  HOME EXERCISE PROGRAM: Access Code: [email or web address] URL: [email or web address] Date: 05/06/2023 Prepared by: Margretta Ditty  Exercises - Supine Active Straight Leg Raise  - 1 x daily - 7 x weekly - 1 sets - 10 reps - Sidelying Hip Abduction  - 1 x daily - 7 x weekly - 2 sets - 10 reps - Sidelying Diagonal Hip Abduction  - 1 x daily - 7 x weekly - 2 sets - 10 reps - Supine Bridge  - 2 x daily - 7 x weekly - 1 sets - 10 reps - Sit to Stand with Armchair  - 1 x daily - 7 x weekly - 1 sets - 10 reps - Lateral Step Up with Counter Support  - 1 x daily - 7 x weekly - 3 sets - 10 reps - Forward Step Up with Counter Support  - 1 x daily - 7 x weekly - 3 sets - 10 reps - Side Stepping with Counter Support  - 1 x daily -  7 x weekly - 3 sets - 10 reps  ASSESSMENT:  CLINICAL IMPRESSION: Patient is making good progress with timed trials of sit to stand exercise (see below). Gait speed intervals incorporated during gait training to progress endurance and ambulatory speed; patient fatigued quickly and required seated rest breaks between sets. Increased knee pain in stabilizing leg during standing hip abd/ext.   OBJECTIVE IMPAIRMENTS: Abnormal gait, decreased activity tolerance, decreased balance, decreased mobility, decreased strength, impaired flexibility, obesity, and pain.   GOALS: Goals reviewed with patient? Yes   UPDATED GOALS: SHORT TERM GOALS: Target date: 06/05/23  1.  The patient will report standing tolerance > 15 minutes at kitchen countertop-- will demo in clinic with standing exercise and activities. Baseline: 45 min Goal status: MET  LONG  TERM GOALS: Target date: 07/05/23  The patient will be indep with progression of HEP. Baseline: doing HEP 1-2x/week Goal status: UPDATED TARGET DATE  2.   The patient will improve gait speed to > or equal to 1.6  ft/sec to demo improving community ambulation status. Baseline:  1.38 seconds on 04/15/23 Goal status:  UPDATED   3.  The patient will perform 5 time sit<>stand in < or equal to 16 seconds. Baseline: 17 sec Goal status: IN PROGRESS  4.  The patient will report ability to stand at the kitchen counter x 10 minutes without sensation of not being able to lift her legs after standing. Baseline:  Able to lift leg with less pain Goal status: MET On 04/15/23   PLAN:  PT FREQUENCY:1-2x/week  PT DURATION: 8 weeks  PLANNED INTERVENTIONS: Therapeutic exercises, Therapeutic activity, Neuromuscular re-education, Balance training, Gait training, Patient/Family education, Self Care, and Joint mobilization  PLAN FOR NEXT SESSION:   Gait training with [facility], work on standing tolerance, gait with RW working on speed and distance for endurance.   Sanjuana Mae, PTA 06/17/2023 12:34 PM

## 2023-06-18 DIAGNOSIS — L821 Other seborrheic keratosis: Secondary | ICD-10-CM | POA: Diagnosis not present

## 2023-06-18 DIAGNOSIS — L0100 Impetigo, unspecified: Secondary | ICD-10-CM | POA: Diagnosis not present

## 2023-06-19 ENCOUNTER — Other Ambulatory Visit: Payer: Self-pay | Admitting: Family Medicine

## 2023-06-19 ENCOUNTER — Other Ambulatory Visit: Payer: Self-pay

## 2023-06-19 DIAGNOSIS — M5416 Radiculopathy, lumbar region: Secondary | ICD-10-CM

## 2023-06-19 DIAGNOSIS — M2550 Pain in unspecified joint: Secondary | ICD-10-CM

## 2023-06-19 DIAGNOSIS — E1149 Type 2 diabetes mellitus with other diabetic neurological complication: Secondary | ICD-10-CM

## 2023-06-19 MED ORDER — TIRZEPATIDE 10 MG/0.5ML ~~LOC~~ SOAJ: 10.0000 mg | SUBCUTANEOUS | 0 refills | Status: DC

## 2023-06-20 ENCOUNTER — Other Ambulatory Visit (INDEPENDENT_AMBULATORY_CARE_PROVIDER_SITE_OTHER): Payer: Medicare Other

## 2023-06-20 ENCOUNTER — Telehealth: Payer: Self-pay

## 2023-06-20 ENCOUNTER — Ambulatory Visit (INDEPENDENT_AMBULATORY_CARE_PROVIDER_SITE_OTHER): Payer: Medicare Other | Admitting: Sports Medicine

## 2023-06-20 ENCOUNTER — Telehealth: Payer: Self-pay | Admitting: Sports Medicine

## 2023-06-20 DIAGNOSIS — M170 Bilateral primary osteoarthritis of knee: Secondary | ICD-10-CM

## 2023-06-20 MED ORDER — TRIAMCINOLONE ACETONIDE 40 MG/ML IJ SUSP
80.0000 mg | Freq: Once | INTRAMUSCULAR | Status: AC
  Administered 2023-06-20: 80 mg via INTRAMUSCULAR

## 2023-06-20 NOTE — Progress Notes (Signed)
    Procedures performed today:    Procedure: Real-time Ultrasound Guided injection of the left knee Device: Samsung HS60  Verbal informed consent obtained.  Time-out conducted.  Noted no overlying erythema, induration, or other signs of local infection.  Skin prepped in a sterile fashion.  Local anesthesia: Topical Ethyl chloride.  With sterile technique and under real time ultrasound guidance: 1 cc Kenalog 40, 2 cc lidocaine, 2 cc bupivacaine injected easily Completed without difficulty  Advised to call if fevers/chills, erythema, induration, drainage, or persistent bleeding.  Images permanently stored and available for review in PACS.  Impression: Technically successful ultrasound guided injection.   Procedure: Real-time Ultrasound Guided injection of the right knee Device: Samsung HS60  Verbal informed consent obtained.  Time-out conducted.  Noted no overlying erythema, induration, or other signs of local infection.  Skin prepped in a sterile fashion.  Local anesthesia: Topical Ethyl chloride.  With sterile technique and under real time ultrasound guidance: Moderate effusion noted, 1 cc Kenalog 40, 2 cc lidocaine, 2 cc bupivacaine injected easily Completed without difficulty  Advised to call if fevers/chills, erythema, induration, drainage, or persistent bleeding.  Images permanently stored and available for review in PACS.  Impression: Technically successful ultrasound guided injection.  Independent interpretation of notes and tests performed by another provider:   None.  Brief History, Exam, Impression, and Recommendations:    Primary osteoarthritis of both knees This is an exquisitely pleasant 72 year old female, she has known bilateral knee osteoarthritis, she is not a candidate currently for arthroplasty. We have done multiple injections, she had a series of Orthovisc that was done in the summertime. She noted good improvements with the Orthovisc but also dramatic  improvements in her ADLs with physical therapy. As she did still have some pain in her knees we had her discuss geniculate artery embolization. She had this done in both knees and tells [place] that she has had an additional 75% improvement in both knees with geniculate artery embolization. As she is still having some discomfort we did bilateral steroid injections today. We will also get her approved again for Orthovisc. She will continue PT, adding some more sessions.    ____________________________________________ Ihor Austin. Benjamin Stain, M.D., ABFM., CAQSM., AME. Primary Care and Sports Medicine [facility]  Adjunct Professor of Family Medicine  [employer]  Restaurant manager, fast food

## 2023-06-20 NOTE — Telephone Encounter (Signed)
Thank you!  Please let patient know

## 2023-06-20 NOTE — Assessment & Plan Note (Signed)
This is an exquisitely pleasant 71 year old female, she has known bilateral knee osteoarthritis, she is not a candidate currently for arthroplasty. We have done multiple injections, she had a series of Orthovisc that was done in the summertime. She noted good improvements with the Orthovisc but also dramatic improvements in her ADLs with physical therapy. As she did still have some pain in her knees we had her discuss geniculate artery embolization. She had this done in both knees and tells [place] that she has had an additional 75% improvement in both knees with geniculate artery embolization. As she is still having some discomfort we did bilateral steroid injections today. We will also get her approved again for Orthovisc. She will continue PT, adding some more sessions.

## 2023-06-20 NOTE — Addendum Note (Signed)
Addended by: Carren Rang A on: 06/20/2023 03:48 PM   Modules accepted: Orders

## 2023-06-20 NOTE — Telephone Encounter (Signed)
Phone call to pt to follow up from her [facility] on 06/07/23. Pt. Reports that her knee pain is significantly better. Pt. Denies any signs of bleeding, infection, redness at the left anterior tibial site, drainage at the site, fever, nausea, or vomiting. Pt has no complaints at this time. Dr. Elby Showers will call her within the month to check on her status post-procedure. Pt advised to call back if anything were to change or any concerns arise and we will arrange an in person appointment. Pt verbalized understanding.

## 2023-06-20 NOTE — Telephone Encounter (Signed)
Bilateral viscosupplementation or Orthovisc approval please

## 2023-06-20 NOTE — Clinical Documentation Improvement (Signed)
done

## 2023-06-21 ENCOUNTER — Other Ambulatory Visit: Payer: Self-pay | Admitting: Family Medicine

## 2023-06-21 DIAGNOSIS — E1149 Type 2 diabetes mellitus with other diabetic neurological complication: Secondary | ICD-10-CM

## 2023-06-21 NOTE — Telephone Encounter (Signed)
Contacted pt to let her know a new script for mounjaro had been sent in.

## 2023-06-21 NOTE — Telephone Encounter (Signed)
PA information submitted via [email or web address] for Orthovisc Paperwork has been printed and given to Dr. T for signatures. Once obtained, information will be faxed to [facility] at [phone]  

## 2023-06-27 ENCOUNTER — Other Ambulatory Visit: Payer: Self-pay | Admitting: Family Medicine

## 2023-06-27 ENCOUNTER — Encounter: Payer: Self-pay | Admitting: Family Medicine

## 2023-06-27 DIAGNOSIS — E1149 Type 2 diabetes mellitus with other diabetic neurological complication: Secondary | ICD-10-CM

## 2023-06-27 DIAGNOSIS — I700 Atherosclerosis of aorta: Secondary | ICD-10-CM

## 2023-06-27 DIAGNOSIS — I10 Essential (primary) hypertension: Secondary | ICD-10-CM

## 2023-06-28 ENCOUNTER — Ambulatory Visit: Payer: Medicare Other | Admitting: Rehabilitative and Restorative Service Providers"

## 2023-06-28 DIAGNOSIS — M6281 Muscle weakness (generalized): Secondary | ICD-10-CM | POA: Diagnosis not present

## 2023-06-28 DIAGNOSIS — M25562 Pain in left knee: Secondary | ICD-10-CM | POA: Diagnosis not present

## 2023-06-28 DIAGNOSIS — G8929 Other chronic pain: Secondary | ICD-10-CM

## 2023-06-28 DIAGNOSIS — M170 Bilateral primary osteoarthritis of knee: Secondary | ICD-10-CM | POA: Diagnosis not present

## 2023-06-28 DIAGNOSIS — R2689 Other abnormalities of gait and mobility: Secondary | ICD-10-CM | POA: Diagnosis not present

## 2023-06-28 DIAGNOSIS — M25561 Pain in right knee: Secondary | ICD-10-CM | POA: Diagnosis not present

## 2023-06-28 NOTE — Therapy (Signed)
OUTPATIENT PHYSICAL THERAPY LOWER EXTREMITY TREATMENT  and 10th visit progress note   Patient Name: Natalie Wright MRN: [id number] DOB:[birth year], 72 y.o., female Today's Date: 06/28/2023  Physical Therapy Progress Note   Dates of Reporting Period:04/05/23 TO 06/28/23   Objective Measurements: Gait speed measured below. 5 time sit<.>stand has improved from 56 seconds to 17 seconds since evaluation.  Reason Skilled Services are Required: PT and patient discussed need for continued compliance with HEP. She has made functional gains of standing to cook for longer periods, able to get into/out of her car without pain, able to walk with RW in the community with improved mobility, and now we are working on return to large based quad cane use.  *PT and patient discussed skilled therapy and able to continue if she can re-commit to compliance with HEP. Patent has comorbidities that make skillled therapy beneficial for mobility training.  Thank you for the referral of this patient.    PT End of Session - 06/28/23 1117     Visit Number 30    Number of Visits 35    Date for PT Re-Evaluation 07/05/23    Authorization Type medicare and tricare    Progress Note Due on Visit 30    PT Start Time 1115    PT Stop Time 1145    PT Time Calculation (min) 30 min    Activity Tolerance Patient tolerated treatment well    Behavior During Therapy WFL for tasks assessed/performed             Past Medical History:  Diagnosis Date   Allergy    to cats and dogs   Anemia    Arthritis    Cataract    Diabetes mellitus    GERD (gastroesophageal reflux disease)    Glaucoma    Hiatal hernia    and delayed gastric emptying/ sees [facility] for recurrent diarrhea   History of cardiovascular disorder 12/10/2008   Qualifier: Diagnosis of  By: Linford Arnold MD, Catherine     Hypertension    OSA (obstructive sleep apnea)    CPAP   Pedal edema    Sleep apnea    Spastic bladder    urology in [facility]   TIA  (transient ischemic attack)    hx of- sees Dr Rayburn Ma (Neurology)   Past Surgical History:  Procedure Laterality Date   CATARACT EXTRACTION  08/2021   IR ANGIOGRAM SELECTIVE EACH ADDITIONAL VESSEL  04/19/2023   IR ANGIOGRAM SELECTIVE EACH ADDITIONAL VESSEL  04/19/2023   IR ANGIOGRAM SELECTIVE EACH ADDITIONAL VESSEL  04/19/2023   IR EMBO ARTERIAL NOT HEMORR HEMANG INC GUIDE ROADMAPPING  04/19/2023   IR EMBO ARTERIAL NOT HEMORR HEMANG INC GUIDE ROADMAPPING  06/07/2023   IR RADIOLOGIST EVAL & MGMT  03/08/2023   IR RADIOLOGIST EVAL & MGMT  05/20/2023   IR US GUIDE VASC ACCESS RIGHT  04/19/2023   TUBAL LIGATION     vaginal skin tag removal     Patient Active Problem List   Diagnosis Date Noted   BMI 50.0-59.9, adult (HCC) 06/04/2023   Diabetic retinopathy (HCC) 02/26/2023   Lumbar spondylosis 07/03/2022   Lumbar spinal stenosis 02/26/2022   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/2022   Bilateral hip pain 01/12/2022   Hearing loss due to cerumen impaction, right 01/12/2022   Hammertoe, bilateral 08/23/2021   History of foot ulcer 08/23/2021   Primary osteoarthritis of both knees 12/09/2020   History of burning pain in leg 08/29/2020   Type 2 diabetes  mellitus with neurological complications (HCC) 08/29/2020   Tremor 05/05/2020   Pre-ulcerative corn or callous 01/29/2020   History of transient ischemic attack (TIA) 05/15/2018   Family history of ovarian cancer 04/02/2017   Aortic atherosclerosis (HCC) 08/31/2016   Venous stasis 08/10/2016   Palpitations 07/16/2016   Atrial dilatation, left 07/16/2016   Atrial septal aneurysm 07/16/2016   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   Cataract 04/18/2012   Hyperlipidemia 02/13/2011   Pulmonary nodules 09/22/2010   LEG PAIN 09/12/2009   Sleep apnea 04/13/2009   GERD 03/14/2009   Morbid obesity (HCC) 12/10/2008   Essential hypertension, benign 12/10/2008   RAYNAUD'S DISEASE 12/10/2008   Osteoarthritis 12/10/2008   URINARY INCONTINENCE  12/10/2008    PCP: [name], MD REFERRING PROVIDER: [name], MD REFERRING DIAG:  M17.0 (ICD-10-CM) - Primary osteoarthritis of both knees   THERAPY DIAG:  Muscle weakness (generalized)  Chronic pain of right knee  Chronic pain of left knee  Other abnormalities of gait and mobility  Rationale for Evaluation and Treatment: Rehabilitation  ONSET DATE: 12/17/22  SUBJECTIVE:  SUBJECTIVE STATEMENT: Patient reports she has not been as compliant with HEP this week. The pain is worse due to cold weather. She continues with "slippage" pain in knees.  She has had a lot of recent appointments. She also has pain in both hips. She is now able to get into and out of the car without pain.   PERTINENT HISTORY: Diabetes, HTN, TIA, spinal stenosis, polymyalgia rheumatica PAIN:  Are you having pain? Yes: NPRS scale: 7 /10 Pain location: back (hip to hip) and knees Pain description: "snap, crackle, pop" pain, instability at knees where they give out, and neuropathy in her feet Aggravating factors: trying to move after standing, pivoting to step  Relieving factors: nothing  PRECAUTIONS: Fall  WEIGHT BEARING RESTRICTIONS: No  FALLS:  Has patient fallen in last 6 months? Yes. Number of falls 1  PATIENT GOALS: "I'm not sure. I don't know how much therapy will help before knee surgery."  Be able to get in and out of her car easier-- right now, it is so slow and a long process.   OBJECTIVE:  (Measures in this section from initial evaluation unless otherwise noted) PATIENT SURVEYS:  FOTO n/a-- mobility focus SENSATION: Notes neuropathy with some burning in feet, dec'd sensation EDEMA:  Bilateral swelling noted in LEs  LOWER EXTREMITY ROM: Active ROM Right eval Left eval Right 04/05/23 Left 04/05/23  Hip flexion      Hip extension      Hip abduction      Hip adduction      Hip internal rotation      Hip external rotation      Knee flexion 110 110 111 105   Knee extension 0 0    Ankle dorsiflexion      Ankle plantarflexion      Ankle inversion      Ankle eversion       (Blank rows = not tested)  LOWER EXTREMITY MMT: *can lift against gravity MMT Right eval Left eval Right 02/18/23 Left 02/18/23 Right 04/05/23 Left 04/05/23  Hip flexion 3/5 3/5 4+/5 4+/5 4+/5 4+/5  Hip extension        Hip abduction        Hip adduction        Hip internal rotation        Hip external rotation        Knee flexion 3/5 3/5 5/5 5/5  Knee extension 3/5 3/5 5/5 5/5    Ankle dorsiflexion 4/5 4/5      Ankle plantarflexion 3/5 3/5      Ankle inversion        Ankle eversion         (Blank rows = not tested) FUNCTIONAL TESTS:  5 time sit to stand=56.61  Gait speed=0.302 ft/sec *significantly slowed speed* GAIT: Distance walked: with [facility] slowed pace x 75 feet requiring increased time Assistive device utilized: Quad cane large base Level of assistance: Modified independence  slowed pace Comments: patient's quad cane is too high and she switches R and L sides-- PT to address Shoewear: Patient has to use slip on shoes and these slide during gait activities-- PT to address  [facility] Adult PT Treatment:                                                DATE: 06/28/23 Therapeutic Exercise: Supine SLR R and L x 10 reps Bridges x 10 reps Sidelying  Hip abduction x 10 reps Hip abduction diagonals x 10 reps Standing Heel raises  x 12 reps Sidestepping R and L at UE support Therapeutic Activity: Sit<>stand x 5 reps Gait: Gait speed with quad cane=0.56 ft/sec Gait training with [facility] trialing in R UE and L UE - Recommend use in the L UE and adjusted to correct height. Gait x 50 ft x 3   [facility] Adult PT Treatment:                                                DATE: 06/17/2023 Therapeutic Exercise: STS 2x5 (updated time in LTGs) Standing at counter: Hip abd x10 (B) Hip ext x10 (B) Seated: Isometric knee extension with orange PB x10 (B) Isometric knee  flexion with green bolster x 10 (B) Therapeutic Activity: Walking with focus on gait speed --> fast/normal paced intervals x80', x160', x80'    [facility] Adult PT Treatment:                                                DATE: 05/13/2023 Therapeutic Exercise: Supine: Heel slides --> knee flexion AROM orange PB  Bridge with on leg on orange PB x10 (B) Side Lying: Hip extension in abd x10  Hip abd small circles CW/CCW x6 eacj Straight leg hip abd: ER x10, IR x10 Seated: Isometric knee extension 10x3" Timed walking trials for gait speed --> 18' x 3 rounds (12" - 11")   [facility] Adult PT Treatment:                                                DATE: 05/06/23 Therapeutic Exercise: Supine SLR x 2# x 12 reps Sidelying Hip abduction x 12 reps 2#  Hip diagonals x 12 reps 2# Seated Isometric knee extension-- not doing this one at home Standing Sit<>stand x 10 reps Gait: 140 feet with RW mod indep Attempted gait with SBQC-- she uses LBQC in  community and [facility] was challenging with SBA-- completed 8 feet and then we switched back to RW Self Care: Discussed HEP and reviewed She is not doing quad stretch b/c of edge of bed, she is doing bridges (updated medbridge to reflect)   PATIENT EDUCATION:  Education details: Updated HEP Person educated: Patient Education method: Explanation, Demonstration, and Handouts Education comprehension: verbalized understanding, returned demonstration, and needs further education  HOME EXERCISE PROGRAM: Access Code: [email or web address] URL: [email or web address] Date: 05/06/2023 Prepared by: Margretta Ditty  Exercises - Supine Active Straight Leg Raise  - 1 x daily - 7 x weekly - 1 sets - 10 reps - Sidelying Hip Abduction  - 1 x daily - 7 x weekly - 2 sets - 10 reps - Sidelying Diagonal Hip Abduction  - 1 x daily - 7 x weekly - 2 sets - 10 reps - Supine Bridge  - 2 x daily - 7 x weekly - 1 sets - 10 reps - Sit to Stand with Armchair  - 1 x daily - 7  x weekly - 1 sets - 10 reps - Lateral Step Up with Counter Support  - 1 x daily - 7 x weekly - 3 sets - 10 reps - Forward Step Up with Counter Support  - 1 x daily - 7 x weekly - 3 sets - 10 reps - Side Stepping with Counter Support  - 1 x daily - 7 x weekly - 3 sets - 10 reps  ASSESSMENT:  CLINICAL IMPRESSION: The patient has partially met prior long term goals and PT updated plan of care. We discussed skilled PT versus maintenance and want to continue with specific goals of working on gait speed, gait with LBQC, and progression of HEP. PT is also continuing to work on improving functional standing for cooking and ADLs. PT to continue to new LTGs anticipating d/c at end of this 8 week period.   OBJECTIVE IMPAIRMENTS: Abnormal gait, decreased activity tolerance, decreased balance, decreased mobility, decreased strength, impaired flexibility, obesity, and pain.   GOALS: Goals reviewed with patient? Yes  UPDATED GOALS: SHORT TERM GOALS: Target date: 06/05/23  1.  The patient will report standing tolerance > 15 minutes at kitchen countertop-- will demo in clinic with standing exercise and activities. Baseline: 45 min Goal status: MET  LONG TERM GOALS: Target date: 07/05/23  The patient will be indep with progression of HEP. Baseline: doing HEP 1-2x/week Goal statusPARTIALLY MET  2.   The patient will improve gait speed to > or equal to 1.6  ft/sec to demo improving community ambulation status. Baseline:  1.38 seconds on 04/15/23 Goal status: NOT TESTED WITH rw(with quad cane, slower; 10 ft/17.95 seconds=0.56 ft/sec)  3.  The patient will perform 5 time sit<>stand in < or equal to 16 seconds. Baseline: 17 sec Goal status: PARTIALLY MET  4.  The patient will report ability to stand at the kitchen counter x 10 minutes without sensation of not being able to lift her legs after standing. Baseline:  Able to lift leg with less pain Goal status: MET On 04/15/23   UPDATED GOALS:  SHORT  TERM GOALS: Target date: 07/28/23  1.  The patient will improve gait speed with quad cane to 0.8 ft/sec to demo improving functional gait with LBQC. Baseline: 0.56 ft/sec Goal status: UPDATED  LONG TERM GOALS: Target date: 08/26/23  The patient will be indep with progression of HEP. Baseline: doing HEP 1-2x/week Goal status: UPDATED  2.   The patient will improve gait  speed to > or equal to 1.6  ft/sec  with RW to demo improving community ambulation status. Baseline:  1.38 seconds on 04/15/23 with RW Goal status:  UPDATED (with quad cane, slower; 10 ft/17.95 seconds=0.56 ft/sec)  3.  The patient will perform 5 time sit<>stand in < or equal to 16 seconds. Baseline: 17 sec  Goal status: UPDATED   PLAN:  PT FREQUENCY:1X/WEEK  PT DURATION: 8 weeks  PLANNED INTERVENTIONS: Therapeutic exercises, Therapeutic activity, Neuromuscular re-education, Balance training, Gait training, Patient/Family education, Self Care, and Joint mobilization  PLAN FOR NEXT SESSION:   Gait training with [facility], work on standing tolerance, gait with RW working on speed and distance for endurance.Progressive LE strengthening.  Saysha Menta, PT 06/28/2023 11:18 AM

## 2023-06-30 NOTE — Therapy (Unsigned)
OUTPATIENT PHYSICAL THERAPY LOWER EXTREMITY TREATMENT     Patient Name: Natalie Wright MRN: [id number] DOB:[birth year], 72 y.o., female Today's Date: 06/30/2023       Past Medical History:  Diagnosis Date   Allergy    to cats and dogs   Anemia    Arthritis    Cataract    Diabetes mellitus    GERD (gastroesophageal reflux disease)    Glaucoma    Hiatal hernia    and delayed gastric emptying/ sees [facility] for recurrent diarrhea   History of cardiovascular disorder 12/10/2008   Qualifier: Diagnosis of  By: Linford Arnold MD, Catherine     Hypertension    OSA (obstructive sleep apnea)    CPAP   Pedal edema    Sleep apnea    Spastic bladder    urology in [place]   TIA (transient ischemic attack)    hx of- sees Dr Rayburn Ma (Neurology)   Past Surgical History:  Procedure Laterality Date   CATARACT EXTRACTION  08/2021   IR ANGIOGRAM SELECTIVE EACH ADDITIONAL VESSEL  04/19/2023   IR ANGIOGRAM SELECTIVE EACH ADDITIONAL VESSEL  04/19/2023   IR ANGIOGRAM SELECTIVE EACH ADDITIONAL VESSEL  04/19/2023   IR EMBO ARTERIAL NOT HEMORR HEMANG INC GUIDE ROADMAPPING  04/19/2023   IR EMBO ARTERIAL NOT HEMORR HEMANG INC GUIDE ROADMAPPING  06/07/2023   IR RADIOLOGIST EVAL & MGMT  03/08/2023   IR RADIOLOGIST EVAL & MGMT  05/20/2023   IR US GUIDE VASC ACCESS RIGHT  04/19/2023   TUBAL LIGATION     vaginal skin tag removal     Patient Active Problem List   Diagnosis Date Noted   BMI 50.0-59.9, adult (HCC) 06/04/2023   Diabetic retinopathy (HCC) 02/26/2023   Lumbar spondylosis 07/03/2022   Lumbar spinal stenosis 02/26/2022   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/2022   Bilateral hip pain 01/12/2022   Hearing loss due to cerumen impaction, right 01/12/2022   Hammertoe, bilateral 08/23/2021   History of foot ulcer 08/23/2021   Primary osteoarthritis of both knees 12/09/2020   History of burning pain in leg 08/29/2020   Type 2 diabetes mellitus with neurological complications (HCC)  08/29/2020   Tremor 05/05/2020   Pre-ulcerative corn or callous 01/29/2020   History of transient ischemic attack (TIA) 05/15/2018   Family history of ovarian cancer 04/02/2017   Aortic atherosclerosis (HCC) 08/31/2016   Venous stasis 08/10/2016   Palpitations 07/16/2016   Atrial dilatation, left 07/16/2016   Atrial septal aneurysm 07/16/2016   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   Cataract 04/18/2012   Hyperlipidemia 02/13/2011   Pulmonary nodules 09/22/2010   LEG PAIN 09/12/2009   Sleep apnea 04/13/2009   GERD 03/14/2009   Morbid obesity (HCC) 12/10/2008   Essential hypertension, benign 12/10/2008   RAYNAUD'S DISEASE 12/10/2008   Osteoarthritis 12/10/2008   URINARY INCONTINENCE 12/10/2008    PCP: [name], MD REFERRING PROVIDER: [name], MD REFERRING DIAG:  M17.0 (ICD-10-CM) - Primary osteoarthritis of both knees   THERAPY DIAG:  No diagnosis found.  Rationale for Evaluation and Treatment: Rehabilitation  ONSET DATE: 12/17/22  SUBJECTIVE:  SUBJECTIVE STATEMENT: ***Patient reports she has not been as compliant with HEP this week. The pain is worse due to cold weather. She continues with "slippage" pain in knees.  She has had a lot of recent appointments. She also has pain in both hips. She is now able to get into and out of the car without pain.   PERTINENT HISTORY: Diabetes, HTN, TIA, spinal stenosis, polymyalgia rheumatica  PAIN:  Are you having pain? Yes: NPRS scale: 7 /10 Pain location: back (hip to hip) and knees Pain description: "snap, crackle, pop" pain, instability at knees where they give out, and neuropathy in her feet Aggravating factors: trying to move after standing, pivoting to step  Relieving factors: nothing  PRECAUTIONS: Fall  WEIGHT BEARING RESTRICTIONS: No  FALLS:  Has patient fallen in last 6 months? Yes. Number of falls 1  PATIENT GOALS: "I'm not sure. I don't know how much therapy will help before knee surgery."  Be  able to get in and out of her car easier-- right now, it is so slow and a long process.   OBJECTIVE:  (Measures in this section from initial evaluation unless otherwise noted) PATIENT SURVEYS:  FOTO n/a-- mobility focus SENSATION: Notes neuropathy with some burning in feet, dec'd sensation EDEMA:  Bilateral swelling noted in LEs  LOWER EXTREMITY ROM: Active ROM Right eval Left eval Right 04/05/23 Left 04/05/23  Hip flexion      Hip extension      Hip abduction      Hip adduction      Hip internal rotation      Hip external rotation      Knee flexion 110 110 111 105  Knee extension 0 0    Ankle dorsiflexion      Ankle plantarflexion      Ankle inversion      Ankle eversion       (Blank rows = not tested)  LOWER EXTREMITY MMT: *can lift against gravity MMT Right eval Left eval Right 02/18/23 Left 02/18/23 Right 04/05/23 Left 04/05/23  Hip flexion 3/5 3/5 4+/5 4+/5 4+/5 4+/5  Hip extension        Hip abduction        Hip adduction        Hip internal rotation        Hip external rotation        Knee flexion 3/5 3/5 5/5 5/5    Knee extension 3/5 3/5 5/5 5/5    Ankle dorsiflexion 4/5 4/5      Ankle plantarflexion 3/5 3/5      Ankle inversion        Ankle eversion         (Blank rows = not tested) FUNCTIONAL TESTS:  5 time sit to stand=56.61  Gait speed=0.302 ft/sec *significantly slowed speed* GAIT: Distance walked: with [facility] slowed pace x 75 feet requiring increased time Assistive device utilized: Quad cane large base Level of assistance: Modified independence  slowed pace Comments: patient's quad cane is too high and she switches R and L sides-- PT to address Shoewear: Patient has to use slip on shoes and these slide during gait activities-- PT to address   [facility] Adult PT Treatment:                                                DATE: 07/01/23 Therapeutic Exercise: *** Manual Therapy: *** Neuromuscular re-ed: *** Therapeutic  Activity: *** Gait: *** Modalities: *** Self Care: [facility] Adult PT Treatment:  DATE: 06/28/23 Therapeutic Exercise: Supine SLR R and L x 10 reps Bridges x 10 reps Sidelying  Hip abduction x 10 reps Hip abduction diagonals x 10 reps Standing Heel raises  x 12 reps Sidestepping R and L at UE support Therapeutic Activity: Sit<>stand x 5 reps Gait: Gait speed with quad cane=0.56 ft/sec Gait training with [facility] trialing in R UE and L UE - Recommend use in the L UE and adjusted to correct height. Gait x 50 ft x 3   [facility] Adult PT Treatment:                                                DATE: 06/17/2023 Therapeutic Exercise: STS 2x5 (updated time in LTGs) Standing at counter: Hip abd x10 (B) Hip ext x10 (B) Seated: Isometric knee extension with orange PB x10 (B) Isometric knee flexion with green bolster x 10 (B) Therapeutic Activity: Walking with focus on gait speed --> fast/normal paced intervals x80', x160', x80'    [facility] Adult PT Treatment:                                                DATE: 05/13/2023 Therapeutic Exercise: Supine: Heel slides --> knee flexion AROM orange PB  Bridge with on leg on orange PB x10 (B) Side Lying: Hip extension in abd x10  Hip abd small circles CW/CCW x6 eacj Straight leg hip abd: ER x10, IR x10 Seated: Isometric knee extension 10x3" Timed walking trials for gait speed --> 18' x 3 rounds (12" - 11")   PATIENT EDUCATION:  Education details: Updated HEP Person educated: Patient Education method: Explanation, Demonstration, and Handouts Education comprehension: verbalized understanding, returned demonstration, and needs further education  HOME EXERCISE PROGRAM: Access Code: [email or web address] URL: [email or web address] Date: 05/06/2023 Prepared by: Margretta Ditty  Exercises - Supine Active Straight Leg Raise  - 1 x daily - 7 x weekly - 1 sets - 10 reps - Sidelying Hip  Abduction  - 1 x daily - 7 x weekly - 2 sets - 10 reps - Sidelying Diagonal Hip Abduction  - 1 x daily - 7 x weekly - 2 sets - 10 reps - Supine Bridge  - 2 x daily - 7 x weekly - 1 sets - 10 reps - Sit to Stand with Armchair  - 1 x daily - 7 x weekly - 1 sets - 10 reps - Lateral Step Up with Counter Support  - 1 x daily - 7 x weekly - 3 sets - 10 reps - Forward Step Up with Counter Support  - 1 x daily - 7 x weekly - 3 sets - 10 reps - Side Stepping with Counter Support  - 1 x daily - 7 x weekly - 3 sets - 10 reps  ASSESSMENT:  CLINICAL IMPRESSION: ***The patient has partially met prior long term goals and PT updated plan of care. We discussed skilled PT versus maintenance and want to continue with specific goals of working on gait speed, gait with LBQC, and progression of HEP. PT is also continuing to work on improving functional standing for cooking and ADLs. PT to continue to new LTGs anticipating d/c at end of this 8 week period.   OBJECTIVE IMPAIRMENTS: Abnormal  gait, decreased activity tolerance, decreased balance, decreased mobility, decreased strength, impaired flexibility, obesity, and pain.   GOALS: Goals reviewed with patient? Yes  UPDATED GOALS: SHORT TERM GOALS: Target date: 06/05/23  1.  The patient will report standing tolerance > 15 minutes at kitchen countertop-- will demo in clinic with standing exercise and activities. Baseline: 45 min Goal status: MET  LONG TERM GOALS: Target date: 07/05/23  The patient will be indep with progression of HEP. Baseline: doing HEP 1-2x/week Goal statusPARTIALLY MET  2.   The patient will improve gait speed to > or equal to 1.6  ft/sec to demo improving community ambulation status. Baseline:  1.38 seconds on 04/15/23 Goal status: NOT TESTED WITH rw(with quad cane, slower; 10 ft/17.95 seconds=0.56 ft/sec)  3.  The patient will perform 5 time sit<>stand in < or equal to 16 seconds. Baseline: 17 sec Goal status: PARTIALLY MET  4.  The  patient will report ability to stand at the kitchen counter x 10 minutes without sensation of not being able to lift her legs after standing. Baseline:  Able to lift leg with less pain Goal status: MET On 04/15/23   UPDATED GOALS:  SHORT TERM GOALS: Target date: 07/28/23  1.  The patient will improve gait speed with quad cane to 0.8 ft/sec to demo improving functional gait with LBQC. Baseline: 0.56 ft/sec Goal status: UPDATED  LONG TERM GOALS: Target date: 08/26/23  The patient will be indep with progression of HEP. Baseline: doing HEP 1-2x/week Goal status: UPDATED  2.   The patient will improve gait speed to > or equal to 1.6  ft/sec  with RW to demo improving community ambulation status. Baseline:  1.38 seconds on 04/15/23 with RW Goal status:  UPDATED (with quad cane, slower; 10 ft/17.95 seconds=0.56 ft/sec)  3.  The patient will perform 5 time sit<>stand in < or equal to 16 seconds. Baseline: 17 sec  Goal status: UPDATED   PLAN:  PT FREQUENCY:1X/WEEK  PT DURATION: 8 weeks  PLANNED INTERVENTIONS: Therapeutic exercises, Therapeutic activity, Neuromuscular re-education, Balance training, Gait training, Patient/Family education, Self Care, and Joint mobilization  PLAN FOR NEXT SESSION:   Gait training with [facility], work on standing tolerance, gait with RW working on speed and distance for endurance.Progressive LE strengthening.***  Wetona Viramontes, PT 06/30/2023 8:48 PM

## 2023-07-01 ENCOUNTER — Other Ambulatory Visit: Payer: Self-pay | Admitting: Interventional Radiology

## 2023-07-01 ENCOUNTER — Ambulatory Visit: Payer: Medicare Other

## 2023-07-01 DIAGNOSIS — M6281 Muscle weakness (generalized): Secondary | ICD-10-CM

## 2023-07-01 DIAGNOSIS — R2689 Other abnormalities of gait and mobility: Secondary | ICD-10-CM | POA: Diagnosis not present

## 2023-07-01 DIAGNOSIS — M170 Bilateral primary osteoarthritis of knee: Secondary | ICD-10-CM | POA: Diagnosis not present

## 2023-07-01 DIAGNOSIS — M25562 Pain in left knee: Secondary | ICD-10-CM | POA: Diagnosis not present

## 2023-07-01 DIAGNOSIS — M1711 Unilateral primary osteoarthritis, right knee: Secondary | ICD-10-CM

## 2023-07-01 DIAGNOSIS — M25561 Pain in right knee: Secondary | ICD-10-CM | POA: Diagnosis not present

## 2023-07-01 DIAGNOSIS — G8929 Other chronic pain: Secondary | ICD-10-CM | POA: Diagnosis not present

## 2023-07-01 NOTE — Therapy (Signed)
OUTPATIENT PHYSICAL THERAPY LOWER EXTREMITY TREATMENT     Patient Name: Natalie Wright MRN: [id number] DOB:1951-02-10, 72 y.o., female Today's Date: 07/01/2023    PT End of Session - 07/01/23 1153     Visit Number 31    Number of Visits 38    Date for PT Re-Evaluation 08/27/23    Authorization Type medicare and tricare    Authorization - Visit Number 31    Progress Note Due on Visit 40    PT Start Time 1153    PT Stop Time 1233    PT Time Calculation (min) 40 min    Activity Tolerance Patient tolerated treatment well    Behavior During Therapy WFL for tasks assessed/performed            Past Medical History:  Diagnosis Date   Allergy    to cats and dogs   Anemia    Arthritis    Cataract    Diabetes mellitus    GERD (gastroesophageal reflux disease)    Glaucoma    Hiatal hernia    and delayed gastric emptying/ sees [facility] for recurrent diarrhea   History of cardiovascular disorder 12/10/2008   Qualifier: Diagnosis of  By: Linford Arnold MD, Catherine     Hypertension    OSA (obstructive sleep apnea)    CPAP   Pedal edema    Sleep apnea    Spastic bladder    urology in [place]   TIA (transient ischemic attack)    hx of- sees Dr Rayburn Ma (Neurology)   Past Surgical History:  Procedure Laterality Date   CATARACT EXTRACTION  08/2021   IR ANGIOGRAM SELECTIVE EACH ADDITIONAL VESSEL  04/19/2023   IR ANGIOGRAM SELECTIVE EACH ADDITIONAL VESSEL  04/19/2023   IR ANGIOGRAM SELECTIVE EACH ADDITIONAL VESSEL  04/19/2023   IR EMBO ARTERIAL NOT HEMORR HEMANG INC GUIDE ROADMAPPING  04/19/2023   IR EMBO ARTERIAL NOT HEMORR HEMANG INC GUIDE ROADMAPPING  06/07/2023   IR RADIOLOGIST EVAL & MGMT  03/08/2023   IR RADIOLOGIST EVAL & MGMT  05/20/2023   IR US GUIDE VASC ACCESS RIGHT  04/19/2023   TUBAL LIGATION     vaginal skin tag removal     Patient Active Problem List   Diagnosis Date Noted   BMI 50.0-59.9, adult (HCC) 06/04/2023   Diabetic retinopathy (HCC) 02/26/2023    Lumbar spondylosis 07/03/2022   Lumbar spinal stenosis 02/26/2022   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/2022   Bilateral hip pain 01/12/2022   Hearing loss due to cerumen impaction, right 01/12/2022   Hammertoe, bilateral 08/23/2021   History of foot ulcer 08/23/2021   Primary osteoarthritis of both knees 12/09/2020   History of burning pain in leg 08/29/2020   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   Tremor 05/05/2020   Pre-ulcerative corn or callous 01/29/2020   History of transient ischemic attack (TIA) 05/15/2018   Family history of ovarian cancer 04/02/2017   Aortic atherosclerosis (HCC) 08/31/2016   Venous stasis 08/10/2016   Palpitations 07/16/2016   Atrial dilatation, left 07/16/2016   Atrial septal aneurysm 07/16/2016   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   Cataract 04/18/2012   Hyperlipidemia 02/13/2011   Pulmonary nodules 09/22/2010   LEG PAIN 09/12/2009   Sleep apnea 04/13/2009   GERD 03/14/2009   Morbid obesity (HCC) 12/10/2008   Essential hypertension, benign 12/10/2008   RAYNAUD'S DISEASE 12/10/2008   Osteoarthritis 12/10/2008   URINARY INCONTINENCE 12/10/2008    PCP: [name], MD REFERRING PROVIDER: [name],  MD REFERRING DIAG:  M17.0 (ICD-10-CM) - Primary osteoarthritis of both knees   THERAPY DIAG:  Muscle weakness (generalized)  Chronic pain of right knee  Chronic pain of left knee  Other abnormalities of gait and mobility  Rationale for Evaluation and Treatment: Rehabilitation  ONSET DATE: 12/17/22  SUBJECTIVE:  SUBJECTIVE STATEMENT: Patient reports she is using quad cane when out in community and [facility] around the house.   PERTINENT HISTORY: Diabetes, HTN, TIA, spinal stenosis, polymyalgia rheumatica PAIN:  Are you having pain? Yes: NPRS scale: 7 /10 Pain location: back (hip to hip) and knees Pain description: "snap, crackle, pop" pain, instability at knees where they give out, and  neuropathy in her feet Aggravating factors: trying to move after standing, pivoting to step  Relieving factors: nothing  PRECAUTIONS: Fall  WEIGHT BEARING RESTRICTIONS: No  FALLS:  Has patient fallen in last 6 months? Yes. Number of falls 1  PATIENT GOALS: "I'm not sure. I don't know how much therapy will help before knee surgery."  Be able to get in and out of her car easier-- right now, it is so slow and a long process.   OBJECTIVE:  (Measures in this section from initial evaluation unless otherwise noted) PATIENT SURVEYS:  FOTO n/a-- mobility focus SENSATION: Notes neuropathy with some burning in feet, dec'd sensation EDEMA:  Bilateral swelling noted in LEs  LOWER EXTREMITY ROM: Active ROM Right eval Left eval Right 04/05/23 Left 04/05/23  Hip flexion      Hip extension      Hip abduction      Hip adduction      Hip internal rotation      Hip external rotation      Knee flexion 110 110 111 105  Knee extension 0 0    Ankle dorsiflexion      Ankle plantarflexion      Ankle inversion      Ankle eversion       (Blank rows = not tested)  LOWER EXTREMITY MMT: *can lift against gravity MMT Right eval Left eval Right 02/18/23 Left 02/18/23 Right 04/05/23 Left 04/05/23  Hip flexion 3/5 3/5 4+/5 4+/5 4+/5 4+/5  Hip extension        Hip abduction        Hip adduction        Hip internal rotation        Hip external rotation        Knee flexion 3/5 3/5 5/5 5/5    Knee extension 3/5 3/5 5/5 5/5    Ankle dorsiflexion 4/5 4/5      Ankle plantarflexion 3/5 3/5      Ankle inversion        Ankle eversion         (Blank rows = not tested) FUNCTIONAL TESTS:  5 time sit to stand=56.61  Gait speed=0.302 ft/sec *significantly slowed speed* GAIT: Distance walked: with [facility] slowed pace x 75 feet requiring increased time Assistive device utilized: Quad cane large base Level of assistance: Modified independence  slowed pace Comments: patient's quad cane is too high and she  switches R and L sides-- PT to address Shoewear: Patient has to use slip on shoes and these slide during gait activities-- PT to address  [facility] Adult PT Treatment:  DATE: 07/01/2023 Therapeutic Exercise: Supine: Bridges 2x10 SLR 2x10 (B) Side Lying: Hip abduction 2x10 Leg raises front <--> back x10 Small circles in hip abd CW/CCW x5 each way Therapeutic Activity: Endurance training with long-distance gait training with [facility] --> 3x80' with seated rest breaks b/w laps   [facility] Adult PT Treatment:                                                DATE: 06/28/23 Therapeutic Exercise: Supine SLR R and L x 10 reps Bridges x 10 reps Sidelying  Hip abduction x 10 reps Hip abduction diagonals x 10 reps Standing Heel raises  x 12 reps Sidestepping R and L at UE support Therapeutic Activity: Sit<>stand x 5 reps Gait: Gait speed with quad cane=0.56 ft/sec Gait training with [facility] trialing in R UE and L UE - Recommend use in the L UE and adjusted to correct height. Gait x 50 ft x 3   [facility] Adult PT Treatment:                                                DATE: 06/17/2023 Therapeutic Exercise: STS 2x5 (updated time in LTGs) Standing at counter: Hip abd x10 (B) Hip ext x10 (B) Seated: Isometric knee extension with orange PB x10 (B) Isometric knee flexion with green bolster x 10 (B) Therapeutic Activity: Walking with focus on gait speed --> fast/normal paced intervals x80', x160', x80'   PATIENT EDUCATION:  Education details: Updated HEP Person educated: Patient Education method: Explanation, Demonstration, and Handouts Education comprehension: verbalized understanding, returned demonstration, and needs further education  HOME EXERCISE PROGRAM: Access Code: [email or web address] URL: [email or web address] Date: 05/06/2023 Prepared by: Margretta Ditty  Exercises - Supine Active Straight Leg Raise  - 1 x daily - 7 x weekly - 1 sets -  10 reps - Sidelying Hip Abduction  - 1 x daily - 7 x weekly - 2 sets - 10 reps - Sidelying Diagonal Hip Abduction  - 1 x daily - 7 x weekly - 2 sets - 10 reps - Supine Bridge  - 2 x daily - 7 x weekly - 1 sets - 10 reps - Sit to Stand with Armchair  - 1 x daily - 7 x weekly - 1 sets - 10 reps - Lateral Step Up with Counter Support  - 1 x daily - 7 x weekly - 3 sets - 10 reps - Forward Step Up with Counter Support  - 1 x daily - 7 x weekly - 3 sets - 10 reps - Side Stepping with Counter Support  - 1 x daily - 7 x weekly - 3 sets - 10 reps  ASSESSMENT:  CLINICAL IMPRESSION: Endurance training incorporated with long-distance gait intervals. Noted fatigue at 3/4 point in full track lap; patient able to recover with seated rest breaks. Recommended patient incorporate long-distance walking intervals at home throughout the day as part of HEP.    OBJECTIVE IMPAIRMENTS: Abnormal gait, decreased activity tolerance, decreased balance, decreased mobility, decreased strength, impaired flexibility, obesity, and pain.   GOALS: Goals reviewed with patient? Yes  UPDATED GOALS: SHORT TERM GOALS: Target date: 06/05/23  1.  The patient will report standing tolerance > 15 minutes at kitchen countertop--  will demo in clinic with standing exercise and activities. Baseline: 45 min Goal status: MET  LONG TERM GOALS: Target date: 07/05/23  The patient will be indep with progression of HEP. Baseline: doing HEP 1-2x/week Goal status: PARTIALLY MET  2.   The patient will improve gait speed to > or equal to 1.6  ft/sec to demo improving community ambulation status. Baseline:  1.38 seconds on 04/15/23 Goal status: NOT TESTED WITH rw (with quad cane, slower; 10 ft/17.95 seconds=0.56 ft/sec)  3.  The patient will perform 5 time sit<>stand in < or equal to 16 seconds. Baseline: 17 sec Goal status: PARTIALLY MET  4.  The patient will report ability to stand at the kitchen counter x 10 minutes without sensation of  not being able to lift her legs after standing. Baseline:  Able to lift leg with less pain Goal status: MET On 04/15/23   UPDATED GOALS:  SHORT TERM GOALS: Target date: 07/28/23  1.  The patient will improve gait speed with quad cane to 0.8 ft/sec to demo improving functional gait with [facility]. Baseline: 0.56 ft/sec Goal status: UPDATED  LONG TERM GOALS: Target date: 08/26/23  The patient will be indep with progression of HEP. Baseline: doing HEP 1-2x/week Goal status: UPDATED  2.   The patient will improve gait speed to > or equal to 1.6  ft/sec  with RW to demo improving community ambulation status. Baseline:  1.38 seconds on 04/15/23 with RW Goal status:  UPDATED (with quad cane, slower; 10 ft/17.95 seconds=0.56 ft/sec)  3.  The patient will perform 5 time sit<>stand in < or equal to 16 seconds. Baseline: 17 sec  Goal status: UPDATED   PLAN:  PT FREQUENCY:1X/WEEK  PT DURATION: 8 weeks  PLANNED INTERVENTIONS: Therapeutic exercises, Therapeutic activity, Neuromuscular re-education, Balance training, Gait training, Patient/Family education, Self Care, and Joint mobilization  PLAN FOR NEXT SESSION: Gait training with [facility], work on standing tolerance, gait with RW working on speed and distance for endurance. Continue progressive LE strengthening.  Sanjuana Mae, PTA 07/01/2023 12:32 PM

## 2023-07-04 ENCOUNTER — Encounter: Payer: Self-pay | Admitting: Physical Therapy

## 2023-07-05 ENCOUNTER — Ambulatory Visit
Admission: RE | Admit: 2023-07-05 | Discharge: 2023-07-05 | Disposition: A | Discharge status: Home / Self Care | Payer: Medicare Other | Source: Ambulatory Visit | Attending: Family Medicine | Admitting: Family Medicine

## 2023-07-05 ENCOUNTER — Ambulatory Visit: Payer: Medicare Other

## 2023-07-05 DIAGNOSIS — Z0000 Encounter for general adult medical examination without abnormal findings: Secondary | ICD-10-CM

## 2023-07-05 DIAGNOSIS — Z1231 Encounter for screening mammogram for malignant neoplasm of breast: Secondary | ICD-10-CM | POA: Diagnosis not present

## 2023-07-05 MED ORDER — TIRZEPATIDE 12.5 MG/0.5ML ~~LOC~~ SOAJ: 12.5000 mg | SUBCUTANEOUS | 1 refills | Status: DC

## 2023-07-05 NOTE — Telephone Encounter (Signed)
 Will send over the 12.5 mg dose to see if they have in stock if not then we will need to notify the patient and see if she might want to call around to see if another pharmacy might have it available.  Trulicity is covered

## 2023-07-08 ENCOUNTER — Encounter: Payer: Self-pay | Admitting: Family Medicine

## 2023-07-08 ENCOUNTER — Other Ambulatory Visit: Payer: Self-pay | Admitting: Family Medicine

## 2023-07-08 ENCOUNTER — Inpatient Hospital Stay
Admission: RE | Admit: 2023-07-08 | Discharge: 2023-07-08 | Disposition: A | Discharge status: Home / Self Care | Payer: Medicare Other | Source: Ambulatory Visit | Attending: Interventional Radiology | Admitting: Interventional Radiology

## 2023-07-08 MED ORDER — DILTIAZEM HCL ER COATED BEADS 360 MG PO CP24
360.0000 mg | ORAL_CAPSULE | Freq: Every day | ORAL | 1 refills | Status: DC
  Filled 2023-07-08: qty 90, 90d supply, fill #0

## 2023-07-09 ENCOUNTER — Other Ambulatory Visit: Payer: Self-pay

## 2023-07-09 ENCOUNTER — Encounter: Payer: Self-pay | Admitting: Family Medicine

## 2023-07-09 MED ORDER — DILTIAZEM HCL ER COATED BEADS 360 MG PO CP24: 360.0000 mg | ORAL_CAPSULE | Freq: Every day | ORAL | 1 refills | Status: DC

## 2023-07-09 NOTE — Progress Notes (Signed)
 Please call patient. Normal mammogram.  Repeat in 1 year.

## 2023-07-12 ENCOUNTER — Ambulatory Visit: Payer: Medicare Other | Admitting: Rehabilitative and Restorative Service Providers"

## 2023-07-15 ENCOUNTER — Ambulatory Visit: Payer: Medicare Other | Attending: Sports Medicine | Admitting: Physical Therapy

## 2023-07-15 ENCOUNTER — Encounter: Payer: Self-pay | Admitting: Physical Therapy

## 2023-07-15 DIAGNOSIS — M25561 Pain in right knee: Secondary | ICD-10-CM | POA: Diagnosis present

## 2023-07-15 DIAGNOSIS — M25562 Pain in left knee: Secondary | ICD-10-CM | POA: Insufficient documentation

## 2023-07-15 DIAGNOSIS — G8929 Other chronic pain: Secondary | ICD-10-CM | POA: Diagnosis present

## 2023-07-15 DIAGNOSIS — R2689 Other abnormalities of gait and mobility: Secondary | ICD-10-CM | POA: Diagnosis present

## 2023-07-15 DIAGNOSIS — M6281 Muscle weakness (generalized): Secondary | ICD-10-CM | POA: Diagnosis not present

## 2023-07-15 NOTE — Therapy (Signed)
 OUTPATIENT PHYSICAL THERAPY LOWER EXTREMITY TREATMENT     Patient Name: Natalie Wright MRN: [id number] DOB:1950/09/21, 73 y.o., female Today's Date: 07/15/2023    PT End of Session - 07/15/23 1058     Visit Number 32    Number of Visits 38    Date for PT Re-Evaluation 08/27/23    Authorization Type medicare and tricare    Authorization - Visit Number 32    Progress Note Due on Visit 40    PT Start Time 1100    PT Stop Time 1144    PT Time Calculation (min) 44 min    Activity Tolerance Patient tolerated treatment well             Past Medical History:  Diagnosis Date   Allergy    to cats and dogs   Anemia    Arthritis    Cataract    Diabetes mellitus    GERD (gastroesophageal reflux disease)    Glaucoma    Hiatal hernia    and delayed gastric emptying/ sees [facility] for recurrent diarrhea   History of cardiovascular disorder 12/10/2008   Qualifier: Diagnosis of  By: Alvan MD, Catherine     Hypertension    OSA (obstructive sleep apnea)    CPAP   Pedal edema    Sleep apnea    Spastic bladder    urology in [place]   TIA (transient ischemic attack)    hx of- sees Dr Robin (Neurology)   Past Surgical History:  Procedure Laterality Date   CATARACT EXTRACTION  08/2021   IR ANGIOGRAM SELECTIVE EACH ADDITIONAL VESSEL  04/19/2023   IR ANGIOGRAM SELECTIVE EACH ADDITIONAL VESSEL  04/19/2023   IR ANGIOGRAM SELECTIVE EACH ADDITIONAL VESSEL  04/19/2023   IR EMBO ARTERIAL NOT HEMORR HEMANG INC GUIDE ROADMAPPING  04/19/2023   IR EMBO ARTERIAL NOT HEMORR HEMANG INC GUIDE ROADMAPPING  06/07/2023   IR RADIOLOGIST EVAL & MGMT  03/08/2023   IR RADIOLOGIST EVAL & MGMT  05/20/2023   IR US  GUIDE VASC ACCESS RIGHT  04/19/2023   TUBAL LIGATION     vaginal skin tag removal     Patient Active Problem List   Diagnosis Date Noted   BMI 50.0-59.9, adult (HCC) 06/04/2023   Diabetic retinopathy (HCC) 02/26/2023   Lumbar spondylosis 07/03/2022   Lumbar spinal stenosis  02/26/2022   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/2022   Bilateral hip pain 01/12/2022   Hearing loss due to cerumen impaction, right 01/12/2022   Hammertoe, bilateral 08/23/2021   History of foot ulcer 08/23/2021   Primary osteoarthritis of both knees 12/09/2020   History of burning pain in leg 08/29/2020   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   Tremor 05/05/2020   Pre-ulcerative corn or callous 01/29/2020   History of transient ischemic attack (TIA) 05/15/2018   Family history of ovarian cancer 04/02/2017   Aortic atherosclerosis (HCC) 08/31/2016   Venous stasis 08/10/2016   Palpitations 07/16/2016   Atrial dilatation, left 07/16/2016   Atrial septal aneurysm 07/16/2016   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   Cataract 04/18/2012   Hyperlipidemia 02/13/2011   Pulmonary nodules 09/22/2010   LEG PAIN 09/12/2009   Sleep apnea 04/13/2009   GERD 03/14/2009   Morbid obesity (HCC) 12/10/2008   Essential hypertension, benign 12/10/2008   RAYNAUD'S DISEASE 12/10/2008   Osteoarthritis 12/10/2008   URINARY INCONTINENCE 12/10/2008    PCP: Dorothyann Alvan, MD REFERRING PROVIDER: Curtis Debby PARAS, MD REFERRING DIAG:  M17.0 (ICD-10-CM) - Primary osteoarthritis  of both knees   THERAPY DIAG:  Muscle weakness (generalized)  Chronic pain of right knee  Chronic pain of left knee  Other abnormalities of gait and mobility  Rationale for Evaluation and Treatment: Rehabilitation  ONSET DATE: 12/17/22  SUBJECTIVE:  SUBJECTIVE STATEMENT: 07/15/2023 Pt reports increased aching today which she attributes to weather. Has been taking pain meds but tries to use sparingly given reported sleepiness. Pt states she is planning to try and get orthovisc injection. States steroid injections in December were helpful.  PERTINENT HISTORY: Diabetes, HTN, TIA, spinal stenosis, polymyalgia rheumatica PAIN:  Are you having pain? Yes: NPRS scale: 6/10 Pain  location: back (hip to hip) and knees Pain description: snap, crackle, pop pain, instability at knees where they give out, and neuropathy in her feet Aggravating factors: trying to move after standing, pivoting to step  Relieving factors: nothing  PRECAUTIONS: Fall  WEIGHT BEARING RESTRICTIONS: No  FALLS:  Has patient fallen in last 6 months? Yes. Number of falls 1  PATIENT GOALS: I'm not sure. I don't know how much therapy will help before knee surgery.  Be able to get in and out of her car easier-- right now, it is so slow and a long process.   OBJECTIVE:  (Measures in this section from initial evaluation unless otherwise noted) PATIENT SURVEYS:  FOTO n/a-- mobility focus SENSATION: Notes neuropathy with some burning in feet, dec'd sensation EDEMA:  Bilateral swelling noted in LEs  LOWER EXTREMITY ROM: Active ROM Right eval Left eval Right 04/05/23 Left 04/05/23  Hip flexion      Hip extension      Hip abduction      Hip adduction      Hip internal rotation      Hip external rotation      Knee flexion 110 110 111 105  Knee extension 0 0    Ankle dorsiflexion      Ankle plantarflexion      Ankle inversion      Ankle eversion       (Blank rows = not tested)  LOWER EXTREMITY MMT: *can lift against gravity MMT Right eval Left eval Right 02/18/23 Left 02/18/23 Right 04/05/23 Left 04/05/23  Hip flexion 3/5 3/5 4+/5 4+/5 4+/5 4+/5  Hip extension        Hip abduction        Hip adduction        Hip internal rotation        Hip external rotation        Knee flexion 3/5 3/5 5/5 5/5    Knee extension 3/5 3/5 5/5 5/5    Ankle dorsiflexion 4/5 4/5      Ankle plantarflexion 3/5 3/5      Ankle inversion        Ankle eversion         (Blank rows = not tested) FUNCTIONAL TESTS:  5 time sit to stand=56.61  Gait speed=0.302 ft/sec *significantly slowed speed* GAIT: Distance walked: with [facility] slowed pace x 75 feet requiring increased time Assistive device utilized:  Quad cane large base Level of assistance: Modified independence  slowed pace Comments: patient's quad cane is too high and she switches R and L sides-- PT to address Shoewear: Patient has to use slip on shoes and these slide during gait activities-- PT to address   [facility] Adult PT Treatment:  DATE: 07/15/23 Therapeutic Exercise: Bridge 2x10 cues for breath control and pacing  Hooklying march x8 BIL cues for form Sidelying hip abd x12 BIL cues for hip positioning Heel slide w/ strap and slider x12 BIL cues for form and comfortable ROM Standing heel raise x10 BIL UE support Mini squat x5 w B UE support    [facility] Adult PT Treatment:                                                DATE: 07/01/2023 Therapeutic Exercise: Supine: Bridges 2x10 SLR 2x10 (B) Side Lying: Hip abduction 2x10 Leg raises front <--> back x10 Small circles in hip abd CW/CCW x5 each way Therapeutic Activity: Endurance training with long-distance gait training with [facility] --> 3x80' with seated rest breaks b/w laps   [facility] Adult PT Treatment:                                                DATE: 06/28/23 Therapeutic Exercise: Supine SLR R and L x 10 reps Bridges x 10 reps Sidelying  Hip abduction x 10 reps Hip abduction diagonals x 10 reps Standing Heel raises  x 12 reps Sidestepping R and L at UE support Therapeutic Activity: Sit<>stand x 5 reps Gait: Gait speed with quad cane=0.56 ft/sec Gait training with [facility] trialing in R UE and L UE - Recommend use in the L UE and adjusted to correct height. Gait x 50 ft x 3   [facility] Adult PT Treatment:                                                DATE: 06/17/2023 Therapeutic Exercise: STS 2x5 (updated time in LTGs) Standing at counter: Hip abd x10 (B) Hip ext x10 (B) Seated: Isometric knee extension with orange PB x10 (B) Isometric knee flexion with green bolster x 10 (B) Therapeutic Activity: Walking with focus on gait  speed --> fast/normal paced intervals x80', x160', x80'   PATIENT EDUCATION:  Education details: HEP, rationale for interventions Person educated: Patient Education method: Explanation, Demonstration, and Handouts Education comprehension: verbalized understanding, returned demonstration, and needs further education  HOME EXERCISE PROGRAM: Access Code: [email or web address] URL: [email or web address] Date: 05/06/2023 Prepared by: Tawni Ferrier  Exercises - Supine Active Straight Leg Raise  - 1 x daily - 7 x weekly - 1 sets - 10 reps - Sidelying Hip Abduction  - 1 x daily - 7 x weekly - 2 sets - 10 reps - Sidelying Diagonal Hip Abduction  - 1 x daily - 7 x weekly - 2 sets - 10 reps - Supine Bridge  - 2 x daily - 7 x weekly - 1 sets - 10 reps - Sit to Stand with Armchair  - 1 x daily - 7 x weekly - 1 sets - 10 reps - Lateral Step Up with Counter Support  - 1 x daily - 7 x weekly - 3 sets - 10 reps - Forward Step Up with Counter Support  - 1 x daily - 7 x weekly - 3 sets - 10 reps - Side Stepping with Counter  Support  - 1 x daily - 7 x weekly - 3 sets - 10 reps  ASSESSMENT:  CLINICAL IMPRESSION: 07/15/2023 Pt arrives w/ report of 6/10 pain on NPS, increased aching she attributes to weather. Today continuing with familiar exercises focusing on LE strength, addition of knee mobility exercises which pt does well with. Does require rest breaks due to fatigue. No adverse events, pt reports no increase in pain on departure. Recommend continuing along current POC in order to address relevant deficits and improve functional tolerance. .Pt departs today's session in no acute distress, all voiced questions/concerns addressed appropriately from PT perspective.      OBJECTIVE IMPAIRMENTS: Abnormal gait, decreased activity tolerance, decreased balance, decreased mobility, decreased strength, impaired flexibility, obesity, and pain.   GOALS: Goals reviewed with patient? Yes  UPDATED  GOALS: SHORT TERM GOALS: Target date: 06/05/23  1.  The patient will report standing tolerance > 15 minutes at kitchen countertop-- will demo in clinic with standing exercise and activities. Baseline: 45 min Goal status: MET  LONG TERM GOALS: Target date: 07/05/23  The patient will be indep with progression of HEP. Baseline: doing HEP 1-2x/week Goal status: PARTIALLY MET  2.   The patient will improve gait speed to > or equal to 1.6  ft/sec to demo improving community ambulation status. Baseline:  1.38 seconds on 04/15/23 Goal status: NOT TESTED WITH rw (with quad cane, slower; 10 ft/17.95 seconds=0.56 ft/sec)  3.  The patient will perform 5 time sit<>stand in < or equal to 16 seconds. Baseline: 17 sec Goal status: PARTIALLY MET  4.  The patient will report ability to stand at the kitchen counter x 10 minutes without sensation of not being able to lift her legs after standing. Baseline:  Able to lift leg with less pain Goal status: MET On 04/15/23   UPDATED GOALS:  SHORT TERM GOALS: Target date: 07/28/23  1.  The patient will improve gait speed with quad cane to 0.8 ft/sec to demo improving functional gait with [facility]. Baseline: 0.56 ft/sec Goal status: UPDATED  LONG TERM GOALS: Target date: 08/26/23  The patient will be indep with progression of HEP. Baseline: doing HEP 1-2x/week Goal status: UPDATED  2.   The patient will improve gait speed to > or equal to 1.6  ft/sec  with RW to demo improving community ambulation status. Baseline:  1.38 seconds on 04/15/23 with RW Goal status:  UPDATED (with quad cane, slower; 10 ft/17.95 seconds=0.56 ft/sec)  3.  The patient will perform 5 time sit<>stand in < or equal to 16 seconds. Baseline: 17 sec  Goal status: UPDATED   PLAN:  PT FREQUENCY:1X/WEEK  PT DURATION: 8 weeks  PLANNED INTERVENTIONS: Therapeutic exercises, Therapeutic activity, Neuromuscular re-education, Balance training, Gait training, Patient/Family education,  Self Care, and Joint mobilization  PLAN FOR NEXT SESSION: Gait training with [facility], work on standing tolerance, gait with RW working on speed and distance for endurance. Continue progressive LE strengthening.  Alm DELENA Jenny PT, DPT 07/15/2023 11:48 AM

## 2023-07-16 DIAGNOSIS — L739 Follicular disorder, unspecified: Secondary | ICD-10-CM | POA: Diagnosis not present

## 2023-07-16 DIAGNOSIS — L821 Other seborrheic keratosis: Secondary | ICD-10-CM | POA: Diagnosis not present

## 2023-07-16 DIAGNOSIS — L649 Androgenic alopecia, unspecified: Secondary | ICD-10-CM | POA: Diagnosis not present

## 2023-07-16 DIAGNOSIS — L811 Chloasma: Secondary | ICD-10-CM | POA: Diagnosis not present

## 2023-07-17 NOTE — Progress Notes (Signed)
Reason for follow up:  The patient is seen in virtual telephone follow up today s/p right geniculate artery embolization 04/19/23 and left geniculate artery embolization 06/07/23  Referring Physician(s): Rodney Langton, MD  History of present illness: HPI from last clinic visit 05/20/23: Natalie Wright is a 73 y.o. female with history of severe bilateral knee pain related to osteoarthritis.  Her pertinent comorbidities include obesity, diabetes mellitus, hypertension, obstructive sleep apnea, history of TIA.     She describes her knee pain worst when standing. The pain gets significantly worse when initially lifting leg to ambulate.  Her right knee pain is slightly worse than the left.  She is currently in physical therapy.   Has had series of hyaluronic acid injections with Dr. Benjamin Stain with some improvement but pain persists and is lifestyle limiting.  She is not a candidate for arthroplasty due to elevated body mass index.  Has lost around 120 lbs, hoping to lose about 70 more lbs to get to 200 lbs for TKA.     WOMAC Pain Score:  56/96 VAS Pain Score:  8/10   She expressed interest in pursuing geniculate artery embolization in the hopes it would alleviate her pain and allow her to become more active to continue in her weight loss journey. We discussed risks, benefits, peri-procedural and long-term expectations related to geniculate artery embolization. She was in agreement to proceed starting with the right knee which was more symptomatic.  Her procedure was performed 04/19/23 and she tolerated this well.   She presents for follow up today via virtual tele-health visit.   Her right knee feels significantly better.  She has some residual pain on the inner/anterior aspect of the knee cap.  She states the pain is about 85-95% improved, rating it 3/10.  She still has some sensation of slippage of the knee cap and her leg "gives way".  She is highly interested in having her left knee  done.  We discussed treating her left knee and she was eager to proceed. This was performed 06/07/23 and she presents today via virtual tele-health visit for follow up.   WOMAC 36/96 today.  She states they feel better.  She still has some discomfort, but improved.  She still feels some knee "slippage" with some popping.  She is working with physical therapy and learning how to mobilize her knee movements appropriately.  She is taking mobic for her knees, occasionally taking tramadol. The cold has negatively affected her pain.  She rates her recent average knee pain a 4/10.    She describes some intermittent pain in the left forearm. She is concerned she may have a blood clot here.  This is possibly the site of IV when we did our procedure recently.  We discussed conservative measures like warm compress, ibuprofen for superficial thrombophlebitis.    No redness or numbness.    Past Medical History:  Diagnosis Date   Allergy    to cats and dogs   Anemia    Arthritis    Cataract    Diabetes mellitus    GERD (gastroesophageal reflux disease)    Glaucoma    Hiatal hernia    and delayed gastric emptying/ sees [facility] for recurrent diarrhea   History of cardiovascular disorder 12/10/2008   Qualifier: Diagnosis of  By: Linford Arnold MD, Catherine     Hypertension    OSA (obstructive sleep apnea)    CPAP   Pedal edema    Sleep apnea  Spastic bladder    urology in [place]   TIA (transient ischemic attack)    hx of- sees Dr Rayburn Ma (Neurology)    Past Surgical History:  Procedure Laterality Date   CATARACT EXTRACTION  08/2021   IR ANGIOGRAM SELECTIVE EACH ADDITIONAL VESSEL  04/19/2023   IR ANGIOGRAM SELECTIVE EACH ADDITIONAL VESSEL  04/19/2023   IR ANGIOGRAM SELECTIVE EACH ADDITIONAL VESSEL  04/19/2023   IR EMBO ARTERIAL NOT HEMORR HEMANG INC GUIDE ROADMAPPING  04/19/2023   IR EMBO ARTERIAL NOT HEMORR HEMANG INC GUIDE ROADMAPPING  06/07/2023   IR RADIOLOGIST EVAL & MGMT  03/08/2023   IR  RADIOLOGIST EVAL & MGMT  05/20/2023   IR US GUIDE VASC ACCESS RIGHT  04/19/2023   TUBAL LIGATION     vaginal skin tag removal      Allergies: Atorvastatin, Beclomethasone, Cefaclor, Pioglitazone, Pravastatin, Sulfa antibiotics, Cephalexin, Lipitor [atorvastatin calcium], and Statins  Medications: Prior to Admission medications   Medication Sig Start Date End Date Taking? Authorizing Provider  AMBULATORY NON FORMULARY MEDICATION Medication Name: custom fit knee high compression stocking with 15-20 mmHg pressure. Open toe preferred. 08/10/16   Agapito Games, MD  aspirin 325 MG tablet Take 325 mg by mouth daily.    [provider]  cetirizine (ZYRTEC) 10 MG tablet Take 10 mg by mouth daily.    [provider]  chlorthalidone (HYGROTON) 25 MG tablet TAKE 1 TABLET EVERY OTHER DAY 03/20/23   Agapito Games, MD  clopidogrel (PLAVIX) 75 MG tablet Take 1 tablet (75 mg total) by mouth daily. 09/10/22   Agapito Games, MD  Continuous Glucose Receiver (DEXCOM G7 RECEIVER) DEVI Use for continuous glucose monitoring 06/06/23   Agapito Games, MD  Continuous Glucose Sensor (DEXCOM G7 SENSOR) MISC Change sensor every 10 days 06/06/23   Agapito Games, MD  diltiazem (CARDIZEM CD) 360 MG 24 hr capsule Take 1 capsule (360 mg total) by mouth daily. 07/09/23   Agapito Games, MD  DULoxetine (CYMBALTA) 30 MG capsule TAKE 1 CAPSULE DAILY FOR MUSCULOSKELETAL PAIN 06/21/23   Agapito Games, MD  Ferrous Sulfate Dried 200 (65 Fe) MG TABS Take 1 tablet by mouth daily.    [provider]  fluconazole (DIFLUCAN) 150 MG tablet TAKE 1 TABLET ONCE AS DIRECTED 07/05/23   Agapito Games, MD  insulin glargine (LANTUS) 100 UNIT/ML injection INJECT 35 UNITS UNDER THE SKIN DAILY 09/10/22   Agapito Games, MD  Insulin Pen Needle (NOVOFINE) 30G X 8 MM MISC For use each time injection for insulin 02/10/16   Agapito Games, MD  JARDIANCE 25 MG TABS  tablet TAKE 1 TABLET DAILY 04/08/23   Agapito Games, MD  latanoprost (XALATAN) 0.005 % ophthalmic solution 1 drop.    [provider]  lisinopril (ZESTRIL) 20 MG tablet TAKE 1 TABLET DAILY 07/05/23   Agapito Games, MD  meloxicam (MOBIC) 15 MG tablet Take 1 tablet (15 mg total) by mouth daily as needed. 01/21/23   [name], MD  metFORMIN (GLUCOPHAGE) 1000 MG tablet Take 1 tablet (1,000 mg total) by mouth 2 (two) times daily with a meal. 01/24/23   Agapito Games, MD  Misc. Devices MISC Transfer of care to [facility].  Please provide service to her current CPAP machine for OSA at 14 cm. water pressure.  Send directly to [place] at [facility]. 05/15/19   [provider]  Omega-3 Fatty Acids (FISH OIL) 1200 MG CAPS Take 1 capsule by mouth daily.  [provider]  pantoprazole (PROTONIX) 40 MG tablet Take 1 tablet (40 mg total) by mouth 2 (two) times daily. 01/22/23   Agapito Games, MD  rosuvastatin (CRESTOR) 10 MG tablet TAKE 1 TABLET ONCE A WEEK AT BEDTIME 07/05/23   Agapito Games, MD  tirzepatide [facility]) 12.5 MG/0.5ML Pen Inject 12.5 mg into the skin once a week. 07/05/23   Agapito Games, MD  traMADol (ULTRAM) 50 MG tablet Take 1 tablet (50 mg total) by mouth every 8 (eight) hours as needed for moderate pain. 10/08/22   [name], MD     Family History  Problem Relation Age of Onset   Diabetes Mother    Hypertension Mother    Glaucoma Mother    Cancer Mother        bone cancer   Alcohol abuse Father    Emphysema Father    Other Father        CHF   Ovarian cancer Sister    Endometrial cancer Sister        Endometrial stromal sarcoma    Social History   Socioeconomic History   Marital status: Widowed    Spouse name: Glynda Jaeger   Number of children: 2   Years of education: Masters   Highest education level: Master's degree (e.g., MA, MS, MEng, MEd, MSW, MBA)  Occupational History   Occupation:  Conservation officer, 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   Retired and works 1 day a week for her daughter just to be out and about. Enjoys reading and doing crafts in her free time.   Social Drivers of Corporate investment banker Strain: Low Risk  (06/16/2023)   Overall Financial Resource Strain (CARDIA)    Difficulty of Paying Living Expenses: Not hard at all  Food Insecurity: No Food Insecurity (06/16/2023)   Hunger Vital Sign    Worried About Running Out of Food in the Last Year: Never true    Ran Out of Food in the Last Year: Never true  Transportation Needs: No Transportation Needs (06/16/2023)   PRAPARE - Administrator, Civil Service (Medical): No    Lack of Transportation (Non-Medical): No  Physical Activity: Insufficiently Active (06/16/2023)   Exercise Vital Sign    Days of Exercise per Week: 4 days    Minutes of Exercise per Session: 10 min  Stress: No Stress Concern Present (06/16/2023)   [employer] of Occupational Health - Occupational Stress Questionnaire    Feeling of Stress : Only a little  Social Connections: Moderately Isolated (06/16/2023)   Social Connection and Isolation Panel [NHANES]    Frequency of Communication with Friends and Family: More than three times a week    Frequency of Social Gatherings with Friends and Family: Twice a week    Attends Religious Services: More than 4 times per year    Active Member of Clubs or Organizations: No  Attends Banker Meetings: Never     Marital Status: Widowed     Vital Signs: There were no vitals taken for this visit.  No physical exam was performed in lieu of virtual telephone visit.    Imaging: Bilateral knee radiographs (08/17/22)  Right, Kellgren and Lawrence Grade 3    Left, Kellgren and Lawrence Grade 3   Right GAE (04/19/23)   0.6 mL 100-300 micron embospheres to lateral articular branch of descending geniculate artery  Labs:  CBC: Recent Labs    04/19/23 0925  WBC 9.5  HGB 15.5*  HCT 47.9*  PLT 317    COAGS: Recent Labs    04/19/23 0925  INR 1.0    BMP: Recent Labs    02/26/23 1550 04/19/23 0925  NA 139 137  K 3.5 3.3*  CL 96 97*  CO2 27 27  GLUCOSE 99 184*  BUN 18 19  CALCIUM 9.0 9.3  CREATININE 0.77 0.74  GFRNONAA  --  >60    LIVER FUNCTION TESTS: Recent Labs    02/26/23 1550  BILITOT 0.3  AST 16  ALT 13  ALKPHOS 91  PROT 6.5  ALBUMIN 4.2    Assessment and Plan:  73 year old female with a history of severe bilateral knee pain secondary to advanced (K&G 3) bilateral knee osteoarthritis. She has had a series of hyaluronic acid injections with some improvement but her pain was lifestyle limiting. She wanted to pursue geniculate artery embolization to allow her to be more active and reach her weight loss goals. She was found to be a great candidate for GAE and her right knee was treated 04/19/23. She experienced remarkable pain relief and was interested in having the left knee treated. This was performed 06/07/23.  Her pain rating scales have decreased significantly (WOMAC 56/96 --> 36/96 and VAS 8/10 --> 4/10).  She describes some intermittent pain in the left forearm. She is concerned she may have a blood clot here.  This is possibly the site of IV when we did our procedure recently.  We discussed conservative measures like warm compress, ibuprofen for superficial thrombophlebitis.    -she will try conservative measures for her intermittent forearm pain, if these  don't improve, we could then pursue upper extremity venous duplex to assess for superficial thrombophlebitis -otherwise plan for 6 month follow up in IR clinic.  Marliss Coots, MD Pager: [phone]    I spent a total of 25 Minutes in virtual telephone clinical consultation, greater than 50% of which was counseling/coordinating care for bilateral knee pain.

## 2023-07-18 ENCOUNTER — Encounter: Payer: Self-pay | Admitting: Family Medicine

## 2023-07-18 NOTE — Telephone Encounter (Signed)
I think this comes from another provider I have not written that medication for her in over 5 years.

## 2023-07-19 ENCOUNTER — Ambulatory Visit
Admission: RE | Admit: 2023-07-19 | Discharge: 2023-07-19 | Disposition: A | Discharge status: Home / Self Care | Payer: Medicare Other | Source: Ambulatory Visit | Attending: Interventional Radiology

## 2023-07-19 DIAGNOSIS — M25562 Pain in left knee: Secondary | ICD-10-CM | POA: Diagnosis not present

## 2023-07-19 DIAGNOSIS — M1711 Unilateral primary osteoarthritis, right knee: Secondary | ICD-10-CM

## 2023-07-19 DIAGNOSIS — M25561 Pain in right knee: Secondary | ICD-10-CM | POA: Diagnosis not present

## 2023-07-19 DIAGNOSIS — M79632 Pain in left forearm: Secondary | ICD-10-CM | POA: Diagnosis not present

## 2023-07-19 HISTORY — PX: IR RADIOLOGIST EVAL & MGMT: IMG5224

## 2023-07-19 MED ORDER — CHLORTHALIDONE 25 MG PO TABS: 25.0000 mg | ORAL_TABLET | Freq: Every day | ORAL | 3 refills | Status: DC

## 2023-07-19 NOTE — Telephone Encounter (Signed)
I am not trying to withhold her medications we will definitely get something sent locally today but I just want to clarify that she is not confusing the chlorthalidone and the furosemide because again I have not written furosemide for her since 2018 and it was a 90-day supply with no refills.  So there is no way that her pharmacy would have filled it in December and less it came from somebody else.  Now we have been filling chlorthalidone for her for the last year or 2 and that prescription does look like it was up-to-date so she certainly could have filled that when in December and it does look like it is probably due for renewal soon.  I just want to clarify before I send something over and if she is taking furosemide regularly, is she taking potassium with it?

## 2023-07-22 ENCOUNTER — Ambulatory Visit: Payer: Medicare Other

## 2023-07-22 DIAGNOSIS — R2689 Other abnormalities of gait and mobility: Secondary | ICD-10-CM | POA: Diagnosis not present

## 2023-07-22 DIAGNOSIS — M25562 Pain in left knee: Secondary | ICD-10-CM | POA: Diagnosis not present

## 2023-07-22 DIAGNOSIS — G8929 Other chronic pain: Secondary | ICD-10-CM | POA: Diagnosis not present

## 2023-07-22 DIAGNOSIS — M6281 Muscle weakness (generalized): Secondary | ICD-10-CM

## 2023-07-22 DIAGNOSIS — M25561 Pain in right knee: Secondary | ICD-10-CM | POA: Diagnosis not present

## 2023-07-22 NOTE — Therapy (Signed)
OUTPATIENT PHYSICAL THERAPY LOWER EXTREMITY TREATMENT     Patient Name: Natalie Wright MRN: [id number] DOB:1950-12-01, 73 y.o., female Today's Date: 07/22/2023    PT End of Session - 07/22/23 1154     Visit Number 33    Number of Visits 38    Date for PT Re-Evaluation 08/27/23    Authorization Type medicare and tricare    Authorization - Visit Number 33    Progress Note Due on Visit 40    PT Start Time 1152    PT Stop Time 1230    PT Time Calculation (min) 38 min    Activity Tolerance Patient tolerated treatment well    Behavior During Therapy WFL for tasks assessed/performed             Past Medical History:  Diagnosis Date   Allergy    to cats and dogs   Anemia    Arthritis    Cataract    Diabetes mellitus    GERD (gastroesophageal reflux disease)    Glaucoma    Hiatal hernia    and delayed gastric emptying/ sees [facility] for recurrent diarrhea   History of cardiovascular disorder 12/10/2008   Qualifier: Diagnosis of  By: Linford Arnold MD, Catherine     Hypertension    OSA (obstructive sleep apnea)    CPAP   Pedal edema    Sleep apnea    Spastic bladder    urology in [place]   TIA (transient ischemic attack)    hx of- sees Dr Rayburn Ma (Neurology)   Past Surgical History:  Procedure Laterality Date   CATARACT EXTRACTION  08/2021   IR ANGIOGRAM SELECTIVE EACH ADDITIONAL VESSEL  04/19/2023   IR ANGIOGRAM SELECTIVE EACH ADDITIONAL VESSEL  04/19/2023   IR ANGIOGRAM SELECTIVE EACH ADDITIONAL VESSEL  04/19/2023   IR EMBO ARTERIAL NOT HEMORR HEMANG INC GUIDE ROADMAPPING  04/19/2023   IR EMBO ARTERIAL NOT HEMORR HEMANG INC GUIDE ROADMAPPING  06/07/2023   IR RADIOLOGIST EVAL & MGMT  03/08/2023   IR RADIOLOGIST EVAL & MGMT  05/20/2023   IR RADIOLOGIST EVAL & MGMT  07/19/2023   IR US GUIDE VASC ACCESS RIGHT  04/19/2023   TUBAL LIGATION     vaginal skin tag removal     Patient Active Problem List   Diagnosis Date Noted   BMI 50.0-59.9, adult (HCC) 06/04/2023    Diabetic retinopathy (HCC) 02/26/2023   Lumbar spondylosis 07/03/2022   Lumbar spinal stenosis 02/26/2022   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/2022   Bilateral hip pain 01/12/2022   Hearing loss due to cerumen impaction, right 01/12/2022   Hammertoe, bilateral 08/23/2021   History of foot ulcer 08/23/2021   Primary osteoarthritis of both knees 12/09/2020   History of burning pain in leg 08/29/2020   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   Tremor 05/05/2020   Pre-ulcerative corn or callous 01/29/2020   History of transient ischemic attack (TIA) 05/15/2018   Family history of ovarian cancer 04/02/2017   Aortic atherosclerosis (HCC) 08/31/2016   Venous stasis 08/10/2016   Palpitations 07/16/2016   Atrial dilatation, left 07/16/2016   Atrial septal aneurysm 07/16/2016   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   Cataract 04/18/2012   Hyperlipidemia 02/13/2011   Pulmonary nodules 09/22/2010   LEG PAIN 09/12/2009   Sleep apnea 04/13/2009   GERD 03/14/2009   Morbid obesity (HCC) 12/10/2008   Essential hypertension, benign 12/10/2008   RAYNAUD'S DISEASE 12/10/2008   Osteoarthritis 12/10/2008   URINARY INCONTINENCE 12/10/2008  PCP: [name], MD REFERRING PROVIDER: [name], MD REFERRING DIAG:  M17.0 (ICD-10-CM) - Primary osteoarthritis of both knees   THERAPY DIAG:  Muscle weakness (generalized)  Chronic pain of right knee  Chronic pain of left knee  Other abnormalities of gait and mobility  Rationale for Evaluation and Treatment: Rehabilitation  ONSET DATE: 12/17/22  SUBJECTIVE:  SUBJECTIVE STATEMENT: Patient reports 6-7/10 bilateral knee pain today, states it is worse with the cold weather.  PERTINENT HISTORY: Diabetes, HTN, TIA, spinal stenosis, polymyalgia rheumatica PAIN:  Are you having pain? Yes: NPRS scale: 6/10 Pain location: back (hip to hip) and knees Pain description: "snap, crackle, pop" pain,  instability at knees where they give out, and neuropathy in her feet Aggravating factors: trying to move after standing, pivoting to step  Relieving factors: nothing  PRECAUTIONS: Fall  WEIGHT BEARING RESTRICTIONS: No  FALLS:  Has patient fallen in last 6 months? Yes. Number of falls 1  PATIENT GOALS: "I'm not sure. I don't know how much therapy will help before knee surgery."  Be able to get in and out of her car easier-- right now, it is so slow and a long process.   OBJECTIVE:  (Measures in this section from initial evaluation unless otherwise noted) PATIENT SURVEYS:  FOTO n/a-- mobility focus SENSATION: Notes neuropathy with some burning in feet, dec'd sensation EDEMA:  Bilateral swelling noted in LEs  LOWER EXTREMITY ROM: Active ROM Right eval Left eval Right 04/05/23 Left 04/05/23  Hip flexion      Hip extension      Hip abduction      Hip adduction      Hip internal rotation      Hip external rotation      Knee flexion 110 110 111 105  Knee extension 0 0    Ankle dorsiflexion      Ankle plantarflexion      Ankle inversion      Ankle eversion       (Blank rows = not tested)  LOWER EXTREMITY MMT: *can lift against gravity MMT Right eval Left eval Right 02/18/23 Left 02/18/23 Right 04/05/23 Left 04/05/23  Hip flexion 3/5 3/5 4+/5 4+/5 4+/5 4+/5  Hip extension        Hip abduction        Hip adduction        Hip internal rotation        Hip external rotation        Knee flexion 3/5 3/5 5/5 5/5    Knee extension 3/5 3/5 5/5 5/5    Ankle dorsiflexion 4/5 4/5      Ankle plantarflexion 3/5 3/5      Ankle inversion        Ankle eversion         (Blank rows = not tested) FUNCTIONAL TESTS:  5 time sit to stand=56.61  Gait speed=0.302 ft/sec *significantly slowed speed* GAIT: Distance walked: with [facility] slowed pace x 75 feet requiring increased time Assistive device utilized: Quad cane large base Level of assistance: Modified independence  slowed  pace Comments: patient's quad cane is too high and she switches R and L sides-- PT to address Shoewear: Patient has to use slip on shoes and these slide during gait activities-- PT to address   [facility] Adult PT Treatment:  DATE: 07/22/2023 Therapeutic Exercise: SLR x12 (B) Bridges 2x10 S/L hip abd 2x10 S/L small leg circles in abd CW/CCW x10 each way Walking butt kickers (HS curls)  4" step up/down 2x5 (changing leading leg each) Side stepping along stair railing Standing hip ext (toe tap back) x10 (B) Manual Therapy: IASTM (roller stick) lateral quads/ITB, glutes (B)   [facility] Adult PT Treatment:                                                DATE: 07/15/23 Therapeutic Exercise: Bridge 2x10 cues for breath control and pacing  Hooklying march x8 BIL cues for form Sidelying hip abd x12 BIL cues for hip positioning Heel slide w/ strap and slider x12 BIL cues for form and comfortable ROM Standing heel raise x10 BIL UE support Mini squat x5 w B UE support   [facility] Adult PT Treatment:                                                DATE: 07/01/2023 Therapeutic Exercise: Supine: Bridges 2x10 SLR 2x10 (B) Side Lying: Hip abduction 2x10 Leg raises front <--> back x10 Small circles in hip abd CW/CCW x5 each way Therapeutic Activity: Endurance training with long-distance gait training with WBQC --> 3x80' with seated rest breaks b/w laps   PATIENT EDUCATION:  Education details: HEP, rationale for interventions Person educated: Patient Education method: Explanation, Demonstration, and Handouts Education comprehension: verbalized understanding, returned demonstration, and needs further education  HOME EXERCISE PROGRAM: Access Code: [email or web address] URL: [email or web address] Date: 05/06/2023 Prepared by: Margretta Ditty  Exercises - Supine Active Straight Leg Raise  - 1 x daily - 7 x weekly - 1 sets - 10 reps - Sidelying Hip  Abduction  - 1 x daily - 7 x weekly - 2 sets - 10 reps - Sidelying Diagonal Hip Abduction  - 1 x daily - 7 x weekly - 2 sets - 10 reps - Supine Bridge  - 2 x daily - 7 x weekly - 1 sets - 10 reps - Sit to Stand with Armchair  - 1 x daily - 7 x weekly - 1 sets - 10 reps - Lateral Step Up with Counter Support  - 1 x daily - 7 x weekly - 3 sets - 10 reps - Forward Step Up with Counter Support  - 1 x daily - 7 x weekly - 3 sets - 10 reps - Side Stepping with Counter Support  - 1 x daily - 7 x weekly - 3 sets - 10 reps  ASSESSMENT:  CLINICAL IMPRESSION: Patient demonstrated slower pace and increased weight bearing through upper body due to bilateral knee pain today. Gait speed training deferred today due to patient's low energy and low tolerance for standing exercises. Discussed with patient trying out heating pad at home for hip and thigh soreness/pain.   OBJECTIVE IMPAIRMENTS: Abnormal gait, decreased activity tolerance, decreased balance, decreased mobility, decreased strength, impaired flexibility, obesity, and pain.   GOALS: Goals reviewed with patient? Yes  UPDATED GOALS:  SHORT TERM GOALS: Target date: 07/28/23  1.  The patient will improve gait speed with quad cane to 0.8 ft/sec to demo improving functional gait with LBQC. Baseline: 0.56 ft/sec Goal status: UPDATED  LONG TERM GOALS: Target date: 08/26/23  The patient will be indep with progression of HEP. Baseline: doing HEP 1-2x/week Goal status: UPDATED  2.   The patient will improve gait speed to > or equal to 1.6  ft/sec  with RW to demo improving community ambulation status. Baseline:  1.38 seconds on 04/15/23 with RW Goal status:  UPDATED (with quad cane, slower; 10 ft/17.95 seconds=0.56 ft/sec)  3.  The patient will perform 5 time sit<>stand in < or equal to 16 seconds. Baseline: 17 sec  Goal status: UPDATED   PLAN:  PT FREQUENCY:1X/WEEK  PT DURATION: 8 weeks  PLANNED INTERVENTIONS: Therapeutic exercises,  Therapeutic activity, Neuromuscular re-education, Balance training, Gait training, Patient/Family education, Self Care, and Joint mobilization  PLAN FOR NEXT SESSION: Gait training with [facility], work on standing tolerance, gait with RW working on speed and distance for endurance. Continue progressive LE strengthening.  Carlynn Herald, PTA 07/22/2023 12:32 PM

## 2023-07-25 ENCOUNTER — Other Ambulatory Visit: Payer: Medicare Other

## 2023-07-25 ENCOUNTER — Other Ambulatory Visit: Payer: Self-pay | Admitting: Interventional Radiology

## 2023-07-25 DIAGNOSIS — M79632 Pain in left forearm: Secondary | ICD-10-CM

## 2023-07-29 ENCOUNTER — Ambulatory Visit: Payer: Medicare Other

## 2023-07-29 DIAGNOSIS — M79632 Pain in left forearm: Secondary | ICD-10-CM | POA: Diagnosis not present

## 2023-07-31 NOTE — Therapy (Signed)
OUTPATIENT PHYSICAL THERAPY LOWER EXTREMITY TREATMENT     Patient Name: Natalie Wright MRN: [id number] DOB:1951/02/12, 73 y.o., female Today's Date: 08/01/2023    PT End of Session - 08/01/23 1150     Visit Number 34    Number of Visits 38    Date for PT Re-Evaluation 08/27/23    Authorization Type medicare and tricare    Progress Note Due on Visit 40    PT Start Time 1150    PT Stop Time 1230    PT Time Calculation (min) 40 min    Activity Tolerance Patient tolerated treatment well              Past Medical History:  Diagnosis Date   Allergy    to cats and dogs   Anemia    Arthritis    Cataract    Diabetes mellitus    GERD (gastroesophageal reflux disease)    Glaucoma    Hiatal hernia    and delayed gastric emptying/ sees [facility] for recurrent diarrhea   History of cardiovascular disorder 12/10/2008   Qualifier: Diagnosis of  By: Linford Arnold MD, Catherine     Hypertension    OSA (obstructive sleep apnea)    CPAP   Pedal edema    Sleep apnea    Spastic bladder    urology in [place]   TIA (transient ischemic attack)    hx of- sees Dr Rayburn Ma (Neurology)   Past Surgical History:  Procedure Laterality Date   CATARACT EXTRACTION  08/2021   IR ANGIOGRAM SELECTIVE EACH ADDITIONAL VESSEL  04/19/2023   IR ANGIOGRAM SELECTIVE EACH ADDITIONAL VESSEL  04/19/2023   IR ANGIOGRAM SELECTIVE EACH ADDITIONAL VESSEL  04/19/2023   IR EMBO ARTERIAL NOT HEMORR HEMANG INC GUIDE ROADMAPPING  04/19/2023   IR EMBO ARTERIAL NOT HEMORR HEMANG INC GUIDE ROADMAPPING  06/07/2023   IR RADIOLOGIST EVAL & MGMT  03/08/2023   IR RADIOLOGIST EVAL & MGMT  05/20/2023   IR RADIOLOGIST EVAL & MGMT  07/19/2023   IR US GUIDE VASC ACCESS RIGHT  04/19/2023   TUBAL LIGATION     vaginal skin tag removal     Patient Active Problem List   Diagnosis Date Noted   BMI 50.0-59.9, adult (HCC) 06/04/2023   Diabetic retinopathy (HCC) 02/26/2023   Lumbar spondylosis 07/03/2022   Lumbar spinal stenosis  02/26/2022   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/2022   Bilateral hip pain 01/12/2022   Hearing loss due to cerumen impaction, right 01/12/2022   Hammertoe, bilateral 08/23/2021   History of foot ulcer 08/23/2021   Primary osteoarthritis of both knees 12/09/2020   History of burning pain in leg 08/29/2020   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   Tremor 05/05/2020   Pre-ulcerative corn or callous 01/29/2020   History of transient ischemic attack (TIA) 05/15/2018   Family history of ovarian cancer 04/02/2017   Aortic atherosclerosis (HCC) 08/31/2016   Venous stasis 08/10/2016   Palpitations 07/16/2016   Atrial dilatation, left 07/16/2016   Atrial septal aneurysm 07/16/2016   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   Cataract 04/18/2012   Hyperlipidemia 02/13/2011   Pulmonary nodules 09/22/2010   LEG PAIN 09/12/2009   Sleep apnea 04/13/2009   GERD 03/14/2009   Morbid obesity (HCC) 12/10/2008   Essential hypertension, benign 12/10/2008   RAYNAUD'S DISEASE 12/10/2008   Osteoarthritis 12/10/2008   URINARY INCONTINENCE 12/10/2008    PCP: [name], MD REFERRING PROVIDER: [name], MD REFERRING DIAG:  M17.0 (ICD-10-CM) -  Primary osteoarthritis of both knees   THERAPY DIAG:  Muscle weakness (generalized)  Chronic pain of right knee  Chronic pain of left knee  Other abnormalities of gait and mobility  Rationale for Evaluation and Treatment: Rehabilitation  ONSET DATE: 12/17/22  SUBJECTIVE:  SUBJECTIVE STATEMENT: Pt states she is feeling better with warmer weather. States she got an [place] for her LUE because she was worried about having a clot, states it was negative.  States she is pretty fatigued today from increased walking with doctors appointments today   PERTINENT HISTORY: Diabetes, HTN, TIA, spinal stenosis, polymyalgia rheumatica PAIN:  Are you having pain? Yes: NPRS scale: unrated NPS 08/01/23 Pain location:  back (hip to hip) and knees Pain description: "snap, crackle, pop" pain, instability at knees where they give out, and neuropathy in her feet Aggravating factors: trying to move after standing, pivoting to step  Relieving factors: nothing  PRECAUTIONS: Fall  WEIGHT BEARING RESTRICTIONS: No  FALLS:  Has patient fallen in last 6 months? Yes. Number of falls 1  PATIENT GOALS: "I'm not sure. I don't know how much therapy will help before knee surgery."  Be able to get in and out of her car easier-- right now, it is so slow and a long process.   OBJECTIVE:  (Measures in this section from initial evaluation unless otherwise noted) PATIENT SURVEYS:  FOTO n/a-- mobility focus SENSATION: Notes neuropathy with some burning in feet, dec'd sensation EDEMA:  Bilateral swelling noted in LEs  LOWER EXTREMITY ROM: Active ROM Right eval Left eval Right 04/05/23 Left 04/05/23  Hip flexion      Hip extension      Hip abduction      Hip adduction      Hip internal rotation      Hip external rotation      Knee flexion 110 110 111 105  Knee extension 0 0    Ankle dorsiflexion      Ankle plantarflexion      Ankle inversion      Ankle eversion       (Blank rows = not tested)  LOWER EXTREMITY MMT: *can lift against gravity MMT Right eval Left eval Right 02/18/23 Left 02/18/23 Right 04/05/23 Left 04/05/23  Hip flexion 3/5 3/5 4+/5 4+/5 4+/5 4+/5  Hip extension        Hip abduction        Hip adduction        Hip internal rotation        Hip external rotation        Knee flexion 3/5 3/5 5/5 5/5    Knee extension 3/5 3/5 5/5 5/5    Ankle dorsiflexion 4/5 4/5      Ankle plantarflexion 3/5 3/5      Ankle inversion        Ankle eversion         (Blank rows = not tested) FUNCTIONAL TESTS:  5 time sit to stand=56.61  Gait speed=0.302 ft/sec *significantly slowed speed* GAIT: Distance walked: with [facility] slowed pace x 75 feet requiring increased time Assistive device utilized: Quad cane  large base Level of assistance: Modified independence  slowed pace Comments: patient's quad cane is too high and she switches R and L sides-- PT to address Shoewear: Patient has to use slip on shoes and these slide during gait activities-- PT to address   [facility] Adult PT Treatment:  DATE: 08/01/23 Therapeutic Exercise: Seated marches 2x10 Seated bosu TKE push 2x10 cues for heel drive and quad contraction Supine hamstring curl swiss ball 2x8 cues for pacing and setup Supine SLR x12 BIL cues for comfortable ROM Seated heel/toe raises 2x15 HEP update + education    [facility] Adult PT Treatment:                                                DATE: 07/22/2023 Therapeutic Exercise: SLR x12 (B) Bridges 2x10 S/L hip abd 2x10 S/L small leg circles in abd CW/CCW x10 each way Walking butt kickers (HS curls)  4" step up/down 2x5 (changing leading leg each) Side stepping along stair railing Standing hip ext (toe tap back) x10 (B) Manual Therapy: IASTM (roller stick) lateral quads/ITB, glutes (B)   [facility] Adult PT Treatment:                                                DATE: 07/15/23 Therapeutic Exercise: Bridge 2x10 cues for breath control and pacing  Hooklying march x8 BIL cues for form Sidelying hip abd x12 BIL cues for hip positioning Heel slide w/ strap and slider x12 BIL cues for form and comfortable ROM Standing heel raise x10 BIL UE support Mini squat x5 w B UE support   [facility] Adult PT Treatment:                                                DATE: 07/01/2023 Therapeutic Exercise: Supine: Bridges 2x10 SLR 2x10 (B) Side Lying: Hip abduction 2x10 Leg raises front <--> back x10 Small circles in hip abd CW/CCW x5 each way Therapeutic Activity: Endurance training with long-distance gait training with WBQC --> 3x80' with seated rest breaks b/w laps   PATIENT EDUCATION:  Education details: HEP, rationale for interventions Person educated:  Patient Education method: Explanation, Demonstration, and Handouts Education comprehension: verbalized understanding, returned demonstration, and needs further education  HOME EXERCISE PROGRAM: Access Code: [email or web address] URL: [email or web address] Date: 08/01/2023 Prepared by: Fransisco Hertz  Exercises - Supine Active Straight Leg Raise  - 1 x daily - 7 x weekly - 1 sets - 10 reps - Sidelying Hip Abduction  - 1 x daily - 4 x weekly - 2 sets - 10 reps - Sidelying Diagonal Hip Abduction  - 1 x daily - 4 x weekly - 2 sets - 10 reps - Supine Bridge  - 2 x daily - 4 x weekly - 1 sets - 10 reps - Heel Toe Raises with Counter Support  - 2 x daily - 7 x weekly - 1 sets - 10 reps - Seated March  - 2 x daily - 7 x weekly - 1 sets - 10 reps  ASSESSMENT:  CLINICAL IMPRESSION: Pt arrives w/ report of improving pain/fatigue overall with change in weather but does state she is fatigued from appointments earlier this morning. Given this, limited standing exercises and instead focusing more so on seated/supine exercises building volume. No adverse events, tolerates well but continues to require rest breaks given muscular fatigue. Recommend continuing along current POC in order  to address relevant deficits and improve functional tolerance. Pt departs today's session in no acute distress, all voiced questions/concerns addressed appropriately from PT perspective.     OBJECTIVE IMPAIRMENTS: Abnormal gait, decreased activity tolerance, decreased balance, decreased mobility, decreased strength, impaired flexibility, obesity, and pain.   GOALS: Goals reviewed with patient? Yes  UPDATED GOALS:  SHORT TERM GOALS: Target date: 07/28/23  1.  The patient will improve gait speed with quad cane to 0.8 ft/sec to demo improving functional gait with LBQC. Baseline: 0.56 ft/sec Goal status: UPDATED  LONG TERM GOALS: Target date: 08/26/23  The patient will be indep with progression of HEP. Baseline: doing  HEP 1-2x/week Goal status: UPDATED  2.   The patient will improve gait speed to > or equal to 1.6  ft/sec  with RW to demo improving community ambulation status. Baseline:  1.38 seconds on 04/15/23 with RW Goal status:  UPDATED (with quad cane, slower; 10 ft/17.95 seconds=0.56 ft/sec)  3.  The patient will perform 5 time sit<>stand in < or equal to 16 seconds. Baseline: 17 sec  Goal status: UPDATED   PLAN:  PT FREQUENCY:1X/WEEK  PT DURATION: 8 weeks  PLANNED INTERVENTIONS: Therapeutic exercises, Therapeutic activity, Neuromuscular re-education, Balance training, Gait training, Patient/Family education, Self Care, and Joint mobilization  PLAN FOR NEXT SESSION: Gait training with [facility], work on standing tolerance, gait with RW working on speed and distance for endurance. Continue progressive LE strengthening.  Ashley Murrain PT, DPT 08/01/2023 12:36 PM

## 2023-08-01 ENCOUNTER — Ambulatory Visit: Payer: Medicare Other | Admitting: Physical Therapy

## 2023-08-01 ENCOUNTER — Encounter: Payer: Self-pay | Admitting: Physical Therapy

## 2023-08-01 DIAGNOSIS — G8929 Other chronic pain: Secondary | ICD-10-CM | POA: Diagnosis not present

## 2023-08-01 DIAGNOSIS — R2689 Other abnormalities of gait and mobility: Secondary | ICD-10-CM

## 2023-08-01 DIAGNOSIS — M25562 Pain in left knee: Secondary | ICD-10-CM | POA: Diagnosis not present

## 2023-08-01 DIAGNOSIS — M25561 Pain in right knee: Secondary | ICD-10-CM | POA: Diagnosis not present

## 2023-08-01 DIAGNOSIS — E66813 Obesity, class 3: Secondary | ICD-10-CM | POA: Diagnosis not present

## 2023-08-01 DIAGNOSIS — Z6841 Body Mass Index (BMI) 40.0 and over, adult: Secondary | ICD-10-CM | POA: Diagnosis not present

## 2023-08-01 DIAGNOSIS — G4733 Obstructive sleep apnea (adult) (pediatric): Secondary | ICD-10-CM | POA: Diagnosis not present

## 2023-08-01 DIAGNOSIS — M6281 Muscle weakness (generalized): Secondary | ICD-10-CM

## 2023-08-08 ENCOUNTER — Ambulatory Visit: Payer: Medicare Other | Attending: Sports Medicine | Admitting: Physical Therapy

## 2023-08-08 ENCOUNTER — Encounter: Payer: Self-pay | Admitting: Physical Therapy

## 2023-08-08 DIAGNOSIS — M25561 Pain in right knee: Secondary | ICD-10-CM | POA: Diagnosis present

## 2023-08-08 DIAGNOSIS — G8929 Other chronic pain: Secondary | ICD-10-CM | POA: Diagnosis present

## 2023-08-08 DIAGNOSIS — M25562 Pain in left knee: Secondary | ICD-10-CM | POA: Diagnosis present

## 2023-08-08 DIAGNOSIS — M6281 Muscle weakness (generalized): Secondary | ICD-10-CM | POA: Diagnosis not present

## 2023-08-08 DIAGNOSIS — R2689 Other abnormalities of gait and mobility: Secondary | ICD-10-CM | POA: Insufficient documentation

## 2023-08-08 NOTE — Therapy (Signed)
 OUTPATIENT PHYSICAL THERAPY LOWER EXTREMITY TREATMENT     Patient Name: Natalie Wright MRN: [id number] DOB:[birth year], 73 y.o., female Today's Date: 08/08/2023    PT End of Session - 08/08/23 1102     Visit Number 35    Number of Visits 38    Date for PT Re-Evaluation 08/27/23    Authorization Type medicare and tricare    Progress Note Due on Visit 40    PT Start Time 1102    PT Stop Time 1143    PT Time Calculation (min) 41 min    Activity Tolerance Patient tolerated treatment well               Past Medical History:  Diagnosis Date   Allergy    to cats and dogs   Anemia    Arthritis    Cataract    Diabetes mellitus    GERD (gastroesophageal reflux disease)    Glaucoma    Hiatal hernia    and delayed gastric emptying/ sees [facility] for recurrent diarrhea   History of cardiovascular disorder 12/10/2008   Qualifier: Diagnosis of  By: Alvan MD, Catherine     Hypertension    OSA (obstructive sleep apnea)    CPAP   Pedal edema    Sleep apnea    Spastic bladder    urology in [place]   TIA (transient ischemic attack)    hx of- sees Dr Robin (Neurology)   Past Surgical History:  Procedure Laterality Date   CATARACT EXTRACTION  08/2021   IR ANGIOGRAM SELECTIVE EACH ADDITIONAL VESSEL  04/19/2023   IR ANGIOGRAM SELECTIVE EACH ADDITIONAL VESSEL  04/19/2023   IR ANGIOGRAM SELECTIVE EACH ADDITIONAL VESSEL  04/19/2023   IR EMBO ARTERIAL NOT HEMORR HEMANG INC GUIDE ROADMAPPING  04/19/2023   IR EMBO ARTERIAL NOT HEMORR HEMANG INC GUIDE ROADMAPPING  06/07/2023   IR RADIOLOGIST EVAL & MGMT  03/08/2023   IR RADIOLOGIST EVAL & MGMT  05/20/2023   IR RADIOLOGIST EVAL & MGMT  07/19/2023   IR US  GUIDE VASC ACCESS RIGHT  04/19/2023   TUBAL LIGATION     vaginal skin tag removal     Patient Active Problem List   Diagnosis Date Noted   BMI 50.0-59.9, adult (HCC) 06/04/2023   Diabetic retinopathy (HCC) 02/26/2023   Lumbar spondylosis 07/03/2022   Lumbar spinal stenosis  02/26/2022   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/2022   Bilateral hip pain 01/12/2022   Hearing loss due to cerumen impaction, right 01/12/2022   Hammertoe, bilateral 08/23/2021   History of foot ulcer 08/23/2021   Primary osteoarthritis of both knees 12/09/2020   History of burning pain in leg 08/29/2020   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   Tremor 05/05/2020   Pre-ulcerative corn or callous 01/29/2020   History of transient ischemic attack (TIA) 05/15/2018   Family history of ovarian cancer 04/02/2017   Aortic atherosclerosis (HCC) 08/31/2016   Venous stasis 08/10/2016   Palpitations 07/16/2016   Atrial dilatation, left 07/16/2016   Atrial septal aneurysm 07/16/2016   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   Cataract 04/18/2012   Hyperlipidemia 02/13/2011   Pulmonary nodules 09/22/2010   LEG PAIN 09/12/2009   Sleep apnea 04/13/2009   GERD 03/14/2009   Morbid obesity (HCC) 12/10/2008   Essential hypertension, benign 12/10/2008   RAYNAUD'S DISEASE 12/10/2008   Osteoarthritis 12/10/2008   URINARY INCONTINENCE 12/10/2008    PCP: Dorothyann Alvan, MD REFERRING PROVIDER: Curtis Debby PARAS, MD REFERRING DIAG:  M17.0 (ICD-10-CM) -  Primary osteoarthritis of both knees   THERAPY DIAG:  Muscle weakness (generalized)  Chronic pain of right knee  Chronic pain of left knee  Rationale for Evaluation and Treatment: Rehabilitation  ONSET DATE: 12/17/22  SUBJECTIVE:  SUBJECTIVE STATEMENT: Pt states knees are feeling a bit worse the last couple of days, some difficulty sleeping. Unsure of provocative factors, felt good after last session. No other new updates, states she is having normal pain today.    PERTINENT HISTORY: Diabetes, HTN, TIA, spinal stenosis, polymyalgia rheumatica PAIN:  Are you having pain? Yes: NPRS scale: normal pain 08/08/23 Pain location: back (hip to hip) and knees Pain description: snap, crackle, pop pain,  instability at knees where they give out, and neuropathy in her feet Aggravating factors: trying to move after standing, pivoting to step  Relieving factors: nothing  PRECAUTIONS: Fall  WEIGHT BEARING RESTRICTIONS: No  FALLS:  Has patient fallen in last 6 months? Yes. Number of falls 1  PATIENT GOALS: I'm not sure. I don't know how much therapy will help before knee surgery.  Be able to get in and out of her car easier-- right now, it is so slow and a long process.   OBJECTIVE:  (Measures in this section from initial evaluation unless otherwise noted) PATIENT SURVEYS:  FOTO n/a-- mobility focus SENSATION: Notes neuropathy with some burning in feet, dec'd sensation EDEMA:  Bilateral swelling noted in LEs  LOWER EXTREMITY ROM: Active ROM Right eval Left eval Right 04/05/23 Left 04/05/23  Hip flexion      Hip extension      Hip abduction      Hip adduction      Hip internal rotation      Hip external rotation      Knee flexion 110 110 111 105  Knee extension 0 0    Ankle dorsiflexion      Ankle plantarflexion      Ankle inversion      Ankle eversion       (Blank rows = not tested)  LOWER EXTREMITY MMT: *can lift against gravity MMT Right eval Left eval Right 02/18/23 Left 02/18/23 Right 04/05/23 Left 04/05/23  Hip flexion 3/5 3/5 4+/5 4+/5 4+/5 4+/5  Hip extension        Hip abduction        Hip adduction        Hip internal rotation        Hip external rotation        Knee flexion 3/5 3/5 5/5 5/5    Knee extension 3/5 3/5 5/5 5/5    Ankle dorsiflexion 4/5 4/5      Ankle plantarflexion 3/5 3/5      Ankle inversion        Ankle eversion         (Blank rows = not tested) FUNCTIONAL TESTS:  5 time sit to stand=56.61  Gait speed=0.302 ft/sec *significantly slowed speed* GAIT: Distance walked: with [facility] slowed pace x 75 feet requiring increased time Assistive device utilized: Quad cane large base Level of assistance: Modified independence  slowed  pace Comments: patient's quad cane is too high and she switches R and L sides-- PT to address Shoewear: Patient has to use slip on shoes and these slide during gait activities-- PT to address    [facility] Adult PT Treatment:  DATE: 08/08/23 Therapeutic Exercise: Standing hip ext w/ BIL UE support x8 BIL Standing hip abd w/ BIL UE support x8 BIL Standing heel raises 2x8 w/ UE support  Bosu TKE push 2x15 BIL Red band hamstring curl x10 BIL  Self Care: Education/discussion re: activity modification, positioning for sleeping, communicating w/ providers re: polymyalgia    [facility] Adult PT Treatment:                                                DATE: 08/01/23 Therapeutic Exercise: Seated marches 2x10 Seated bosu TKE push 2x10 cues for heel drive and quad contraction Supine hamstring curl swiss ball 2x8 cues for pacing and setup Supine SLR x12 BIL cues for comfortable ROM Seated heel/toe raises 2x15 HEP update + education    [facility] Adult PT Treatment:                                                DATE: 07/22/2023 Therapeutic Exercise: SLR x12 (B) Bridges 2x10 S/L hip abd 2x10 S/L small leg circles in abd CW/CCW x10 each way Walking butt kickers (HS curls)  4 step up/down 2x5 (changing leading leg each) Side stepping along stair railing Standing hip ext (toe tap back) x10 (B) Manual Therapy: IASTM (roller stick) lateral quads/ITB, glutes (B)   [facility] Adult PT Treatment:                                                DATE: 07/15/23 Therapeutic Exercise: Bridge 2x10 cues for breath control and pacing  Hooklying march x8 BIL cues for form Sidelying hip abd x12 BIL cues for hip positioning Heel slide w/ strap and slider x12 BIL cues for form and comfortable ROM Standing heel raise x10 BIL UE support Mini squat x5 w B UE support   [facility] Adult PT Treatment:                                                DATE: 07/01/2023 Therapeutic  Exercise: Supine: Bridges 2x10 SLR 2x10 (B) Side Lying: Hip abduction 2x10 Leg raises front <--> back x10 Small circles in hip abd CW/CCW x5 each way Therapeutic Activity: Endurance training with long-distance gait training with WBQC --> 3x80' with seated rest breaks b/w laps   PATIENT EDUCATION:  Education details: HEP, rationale for interventions Person educated: Patient Education method: Explanation, Demonstration, and Handouts Education comprehension: verbalized understanding, returned demonstration, and needs further education  HOME EXERCISE PROGRAM: Access Code: [email or web address] URL: [email or web address] Date: 08/01/2023 Prepared by: Alm Jenny  Exercises - Supine Active Straight Leg Raise  - 1 x daily - 7 x weekly - 1 sets - 10 reps - Sidelying Hip Abduction  - 1 x daily - 4 x weekly - 2 sets - 10 reps - Sidelying Diagonal Hip Abduction  - 1 x daily - 4 x weekly - 2 sets - 10 reps - Supine Bridge  - 2 x daily - 4 x weekly -  1 sets - 10 reps - Heel Toe Raises with Counter Support  - 2 x daily - 7 x weekly - 1 sets - 10 reps - Seated March  - 2 x daily - 7 x weekly - 1 sets - 10 reps  ASSESSMENT:  CLINICAL IMPRESSION: 08/08/2023 Pt arrives w/ baseline pain but does endorse symptom flare over past couple days. We are able to continue with more standing exercises today although pt does require seated rest between sets given muscular fatigue and knee pain. She does note that her back/hips have been more irritating than knees lately, feels frustrated with their limitations - education provided on positioning/pacing of activities, encouraged communication w/ providers. No adverse events, cues as above. Recommend continuing along current POC in order to address relevant deficits and improve functional tolerance. Pt departs today's session in no acute distress, all voiced questions/concerns addressed appropriately from PT perspective.     OBJECTIVE IMPAIRMENTS: Abnormal  gait, decreased activity tolerance, decreased balance, decreased mobility, decreased strength, impaired flexibility, obesity, and pain.   GOALS: Goals reviewed with patient? Yes  UPDATED GOALS:  SHORT TERM GOALS: Target date: 07/28/23  1.  The patient will improve gait speed with quad cane to 0.8 ft/sec to demo improving functional gait with LBQC. Baseline: 0.56 ft/sec Goal status: UPDATED  LONG TERM GOALS: Target date: 08/26/23  The patient will be indep with progression of HEP. Baseline: doing HEP 1-2x/week Goal status: UPDATED  2.   The patient will improve gait speed to > or equal to 1.6  ft/sec  with RW to demo improving community ambulation status. Baseline:  1.38 seconds on 04/15/23 with RW Goal status:  UPDATED (with quad cane, slower; 10 ft/17.95 seconds=0.56 ft/sec)  3.  The patient will perform 5 time sit<>stand in < or equal to 16 seconds. Baseline: 17 sec  Goal status: UPDATED   PLAN:  PT FREQUENCY:1X/WEEK  PT DURATION: 8 weeks  PLANNED INTERVENTIONS: Therapeutic exercises, Therapeutic activity, Neuromuscular re-education, Balance training, Gait training, Patient/Family education, Self Care, and Joint mobilization  PLAN FOR NEXT SESSION: Gait training with [facility], work on standing tolerance, gait with RW working on speed and distance for endurance. Continue progressive LE strengthening.  Alm DELENA Jenny PT, DPT 08/08/2023 11:46 AM

## 2023-08-15 ENCOUNTER — Ambulatory Visit: Payer: Medicare Other

## 2023-08-15 DIAGNOSIS — G8929 Other chronic pain: Secondary | ICD-10-CM

## 2023-08-15 DIAGNOSIS — M6281 Muscle weakness (generalized): Secondary | ICD-10-CM | POA: Diagnosis not present

## 2023-08-15 DIAGNOSIS — R2689 Other abnormalities of gait and mobility: Secondary | ICD-10-CM

## 2023-08-15 DIAGNOSIS — M25561 Pain in right knee: Secondary | ICD-10-CM | POA: Diagnosis not present

## 2023-08-15 DIAGNOSIS — M25562 Pain in left knee: Secondary | ICD-10-CM | POA: Diagnosis not present

## 2023-08-15 NOTE — Therapy (Addendum)
 OUTPATIENT PHYSICAL THERAPY LOWER EXTREMITY TREATMENT     Patient Name: Natalie Wright MRN: [id number] DOB:[birth year], 73 y.o., female Today's Date: 08/15/2023    PT End of Session - 08/15/23 1311     Visit Number 36    Number of Visits 38    Authorization Type medicare and tricare    Progress Note Due on Visit 40    PT Start Time 1102    PT Stop Time 1145    PT Time Calculation (min) 43 min    Activity Tolerance Patient tolerated treatment well    Behavior During Therapy WFL for tasks assessed/performed             Past Medical History:  Diagnosis Date   Allergy    to cats and dogs   Anemia    Arthritis    Cataract    Diabetes mellitus    GERD (gastroesophageal reflux disease)    Glaucoma    Hiatal hernia    and delayed gastric emptying/ sees [facility] for recurrent diarrhea   History of cardiovascular disorder 12/10/2008   Qualifier: Diagnosis of  By: Greer Leak MD, Catherine     Hypertension    OSA (obstructive sleep apnea)    CPAP   Pedal edema    Sleep apnea    Spastic bladder    urology in [place]   TIA (transient ischemic attack)    hx of- sees Dr Chriss Coup (Neurology)   Past Surgical History:  Procedure Laterality Date   CATARACT EXTRACTION  08/2021   IR ANGIOGRAM SELECTIVE EACH ADDITIONAL VESSEL  04/19/2023   IR ANGIOGRAM SELECTIVE EACH ADDITIONAL VESSEL  04/19/2023   IR ANGIOGRAM SELECTIVE EACH ADDITIONAL VESSEL  04/19/2023   IR EMBO ARTERIAL NOT HEMORR HEMANG INC GUIDE ROADMAPPING  04/19/2023   IR EMBO ARTERIAL NOT HEMORR HEMANG INC GUIDE ROADMAPPING  06/07/2023   IR RADIOLOGIST EVAL & MGMT  03/08/2023   IR RADIOLOGIST EVAL & MGMT  05/20/2023   IR RADIOLOGIST EVAL & MGMT  07/19/2023   IR US  GUIDE VASC ACCESS RIGHT  04/19/2023   TUBAL LIGATION     vaginal skin tag removal     Patient Active Problem List   Diagnosis Date Noted   BMI 50.0-59.9, adult (HCC) 06/04/2023   Diabetic retinopathy (HCC) 02/26/2023   Lumbar spondylosis 07/03/2022    Lumbar spinal stenosis 02/26/2022   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/2022   Bilateral hip pain 01/12/2022   Hearing loss due to cerumen impaction, right 01/12/2022   Hammertoe, bilateral 08/23/2021   History of foot ulcer 08/23/2021   Primary osteoarthritis of both knees 12/09/2020   History of burning pain in leg 08/29/2020   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   Tremor 05/05/2020   Pre-ulcerative corn or callous 01/29/2020   History of transient ischemic attack (TIA) 05/15/2018   Family history of ovarian cancer 04/02/2017   Aortic atherosclerosis (HCC) 08/31/2016   Venous stasis 08/10/2016   Palpitations 07/16/2016   Atrial dilatation, left 07/16/2016   Atrial septal aneurysm 07/16/2016   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   Cataract 04/18/2012   Hyperlipidemia 02/13/2011   Pulmonary nodules 09/22/2010   LEG PAIN 09/12/2009   Sleep apnea 04/13/2009   GERD 03/14/2009   Morbid obesity (HCC) 12/10/2008   Essential hypertension, benign 12/10/2008   RAYNAUD'S DISEASE 12/10/2008   Osteoarthritis 12/10/2008   URINARY INCONTINENCE 12/10/2008    PCP: Duaine German, MD REFERRING PROVIDER: Gean Keels, MD REFERRING DIAG:  M17.0 (ICD-10-CM) -  Primary osteoarthritis of both knees   THERAPY DIAG:  Muscle weakness (generalized)  Chronic pain of right knee  Chronic pain of left knee  Other abnormalities of gait and mobility  Rationale for Evaluation and Treatment: Rehabilitation  ONSET DATE: 12/17/22  SUBJECTIVE:  SUBJECTIVE STATEMENT: The patient returned to the clinic and reviewed her chief complaints. She stated that she recently started taking Tylenol  Arthritis and it has really helped the low back and hip pain she has been experiencing. She stated the Tylenol  works much better than her other prescription pain medication, which she is glad about because the prescribed medication makes her drowsy. The Tylenol  does not  help the bilateral knee pain as much, which continues to be an issue. She is still experiencing pain, crepitus, and instability at her knees. She arrives today using her two-wheeled walker as the walk from the parking lot is too far for the cane she uses around the house.  PERTINENT HISTORY: Diabetes, HTN, TIA, spinal stenosis, polymyalgia rheumatica PAIN:  Are you having pain? Yes: NPRS scale: "normal pain" 08/08/23 Pain location: back (hip to hip) and knees Pain description: "snap, crackle, pop" pain, instability at knees where they give out, and neuropathy in her feet Aggravating factors: trying to move after standing, pivoting to step  Relieving factors: nothing  PRECAUTIONS: Fall  WEIGHT BEARING RESTRICTIONS: No  FALLS:  Has patient fallen in last 6 months? Yes. Number of falls 1  PATIENT GOALS: "I'm not sure. I don't know how much therapy will help before knee surgery."  Be able to get in and out of her car easier-- right now, it is so slow and a long process.   OBJECTIVE:  (Measures in this section from initial evaluation unless otherwise noted) PATIENT SURVEYS:  FOTO n/a-- mobility focus SENSATION: Notes neuropathy with some burning in feet, dec'd sensation EDEMA:  Bilateral swelling noted in LEs  LOWER EXTREMITY ROM: Active ROM Right eval Left eval Right 04/05/23 Left 04/05/23  Hip flexion      Hip extension      Hip abduction      Hip adduction      Hip internal rotation      Hip external rotation      Knee flexion 110 110 111 105  Knee extension 0 0    Ankle dorsiflexion      Ankle plantarflexion      Ankle inversion      Ankle eversion       (Blank rows = not tested)  LOWER EXTREMITY MMT: *can lift against gravity MMT Right eval Left eval Right 02/18/23 Left 02/18/23 Right 04/05/23 Left 04/05/23  Hip flexion 3/5 3/5 4+/5 4+/5 4+/5 4+/5  Hip extension        Hip abduction        Hip adduction        Hip internal rotation        Hip external rotation         Knee flexion 3/5 3/5 5/5 5/5    Knee extension 3/5 3/5 5/5 5/5    Ankle dorsiflexion 4/5 4/5      Ankle plantarflexion 3/5 3/5      Ankle inversion        Ankle eversion         (Blank rows = not tested) FUNCTIONAL TESTS:  5 time sit to stand=56.61  Gait speed=0.302 ft/sec *significantly slowed speed* GAIT: Distance walked: with [facility] slowed pace x 75 feet requiring increased time Assistive device  utilized: Chiropractor Level of assistance: Modified independence  slowed pace Comments: patient's quad cane is too high and she switches R and L sides-- PT to address Shoewear: Patient has to use slip on shoes and these slide during gait activities-- PT to address  [facility] Adult PT Treatment:                                                DATE: 08/15/23 Therapeutic Exercise: Hooklying: Therapist resisted knee extension slides, x 10 reps R and L Therapist resisted knee flexion slides, x 10 reps R and L Therapist resisted leg press, x 10 reps R and L Therapist resisted ankle plantarflexion (foot on rocker board), x 10 reps R and L Therapist resisted hip external rotation/abduction, x 10 reps R and L Therapist resisted hip internal rotation/adduction, x 10 reps R and L Manual figure 4 piriformis stretch, R and L  [facility] Adult PT Treatment:                                                DATE: 08/08/23 Therapeutic Exercise: Standing hip ext w/ BIL UE support x8 BIL Standing hip abd w/ BIL UE support x8 BIL Standing heel raises 2x8 w/ UE support  Bosu TKE push 2x15 BIL Red band hamstring curl x10 BIL  Self Care: Education/discussion re: activity modification, positioning for sleeping, communicating w/ providers re: polymyalgia    [facility] Adult PT Treatment:                                                DATE: 08/01/23 Therapeutic Exercise: Seated marches 2x10 Seated bosu TKE push 2x10 cues for heel drive and quad contraction Supine hamstring curl swiss ball 2x8 cues for pacing and  setup Supine SLR x12 BIL cues for comfortable ROM Seated heel/toe raises 2x15 HEP update + education  [facility] Adult PT Treatment:                                                DATE: 07/22/2023 Therapeutic Exercise: SLR x12 (B) Bridges 2x10 S/L hip abd 2x10 S/L small leg circles in abd CW/CCW x10 each way Walking butt kickers (HS curls)  4" step up/down 2x5 (changing leading leg each) Side stepping along stair railing Standing hip ext (toe tap back) x10 (B) Manual Therapy: IASTM (roller stick) lateral quads/ITB, glutes (B)  PATIENT EDUCATION:  Education details: HEP, rationale for interventions Person educated: Patient Education method: Explanation, Demonstration, and Handouts Education comprehension: verbalized understanding, returned demonstration, and needs further education  HOME EXERCISE PROGRAM: Access Code: [email or web address] URL: [email or web address] Date: 08/01/2023 Prepared by: Mayme Spearman  Exercises - Supine Active Straight Leg Raise  - 1 x daily - 7 x weekly - 1 sets - 10 reps - Sidelying Hip Abduction  - 1 x daily - 4 x weekly - 2 sets - 10 reps - Sidelying Diagonal Hip Abduction  - 1 x daily - 4 x weekly -  2 sets - 10 reps - Supine Bridge  - 2 x daily - 4 x weekly - 1 sets - 10 reps - Heel Toe Raises with Counter Support  - 2 x daily - 7 x weekly - 1 sets - 10 reps - Seated March  - 2 x daily - 7 x weekly - 1 sets - 10 reps  ASSESSMENT:  CLINICAL IMPRESSION: 08/15/2023 Continued with lower extremity strengthening today in tolerated positions. Patient tolerated exercises well but did experience some tightness following strengthening of the hip external rotators/abductors. Stretching of these muscles helped relieve the sensation of tightness. Patient reported fatigue in bilateral leg musculature following the treatment session but did not note any difficulty walking afterward secondary to fatigue. Did advise patient she may experience musculature soreness  following today's session. Physical therapy remains indicated.    OBJECTIVE IMPAIRMENTS: Abnormal gait, decreased activity tolerance, decreased balance, decreased mobility, decreased strength, impaired flexibility, obesity, and pain.   GOALS: Goals reviewed with patient? Yes  UPDATED GOALS:  SHORT TERM GOALS: Target date: 07/28/23  1.  The patient will improve gait speed with quad cane to 0.8 ft/sec to demo improving functional gait with LBQC. Baseline: 0.56 ft/sec Goal status: UPDATED  LONG TERM GOALS: Target date: 08/26/23  The patient will be indep with progression of HEP. Baseline: doing HEP 1-2x/week Goal status: UPDATED  2.   The patient will improve gait speed to > or equal to 1.6  ft/sec  with RW to demo improving community ambulation status. Baseline:  1.38 seconds on 04/15/23 with RW Goal status:  UPDATED (with quad cane, slower; 10 ft/17.95 seconds=0.56 ft/sec)  3.  The patient will perform 5 time sit<>stand in < or equal to 16 seconds. Baseline: 17 sec  Goal status: UPDATED PLAN:  PT FREQUENCY:1X/WEEK  PT DURATION: 8 weeks  PLANNED INTERVENTIONS: Therapeutic exercises, Therapeutic activity, Neuromuscular re-education, Balance training, Gait training, Patient/Family education, Self Care, and Joint mobilization  PLAN FOR NEXT SESSION: Gait training with [facility], work on standing tolerance, gait with RW working on speed and distance for endurance. Continue progressive LE strengthening.  Jeannette Mills, PT, PhD, DPT  08/15/2023 1:13 PM   PHYSICAL THERAPY DISCHARGE SUMMARY  Visits from Start of Care: 36  Current functional level related to goals / functional outcomes: Improving strength and activity tolerance   Remaining deficits: See above   Education / Equipment: HEP   Patient agrees to discharge. Patient goals were partially met. Patient is being discharged due to not returning since the last visit. Lowery Rue, PT,DPT04/29/253:45 PM

## 2023-08-22 ENCOUNTER — Ambulatory Visit: Payer: Medicare Other | Admitting: Physical Therapy

## 2023-09-03 ENCOUNTER — Ambulatory Visit: Payer: Medicare Other | Admitting: Family Medicine

## 2023-09-05 ENCOUNTER — Ambulatory Visit: Payer: Medicare Other | Admitting: Podiatry

## 2023-09-05 ENCOUNTER — Encounter: Payer: Self-pay | Admitting: Podiatry

## 2023-09-05 DIAGNOSIS — Z7901 Long term (current) use of anticoagulants: Secondary | ICD-10-CM

## 2023-09-05 DIAGNOSIS — B351 Tinea unguium: Secondary | ICD-10-CM

## 2023-09-05 DIAGNOSIS — L84 Corns and callosities: Secondary | ICD-10-CM | POA: Diagnosis not present

## 2023-09-05 DIAGNOSIS — M79675 Pain in left toe(s): Secondary | ICD-10-CM | POA: Diagnosis not present

## 2023-09-05 DIAGNOSIS — E1149 Type 2 diabetes mellitus with other diabetic neurological complication: Secondary | ICD-10-CM | POA: Diagnosis not present

## 2023-09-05 DIAGNOSIS — M79674 Pain in right toe(s): Secondary | ICD-10-CM

## 2023-09-05 NOTE — Progress Notes (Signed)
 Subjective: Chief Complaint  Patient presents with   [facility]    Rm#12 [facility]     73 y.o. returns the office today for painful, elongated, thickened toenails which they cannot trim herself.  She denies any open sores.  No open lesions.  She has no other concerns today.  She is on Plavix.  PCP: Agapito Games, MD Last seen 06/20/2023 A1c: 8.9 on June 04, 2023  Objective: AAO 3, NAD DP/PT pulses palpable, CRT less than 3 seconds Protective sensation decreased with Simms Weinstein monofilament Nails hypertrophic, dystrophic, elongated, brittle, discolored 10. There is tenderness overlying the nails 1-5 bilaterally. There is no surrounding erythema or drainage along the nail sites. Hyperkeratotic lesion right foot submetatarsal 5.  No ongoing ulceration, drainage or signs of infection.  Dry skin present particular to the heels. No other areas of discomfort. Decrease in arch upon weightbearing.  No open lesions or other pre-ulcerative lesions are identified. No pain with calf compression, swelling, warmth, erythema.  Assessment: Patient presents with symptomatic onychomycosis, hyperkeratotic lesions  Plan: -Treatment options including alternatives, risks, complications were discussed -Nails sharply debrided 10 without complication/bleeding. -Hyperkeratotic lesion sharply debrided x 1 without any complications or bleeding.  Continue with moisturizer daily, offloading. -Discussed daily foot inspection. If there are any changes, to call the office immediately.   Return in about 3 months (around 12/06/2023).  Ovid Curd, DPM

## 2023-09-13 ENCOUNTER — Encounter (INDEPENDENT_AMBULATORY_CARE_PROVIDER_SITE_OTHER): Payer: Self-pay | Admitting: Sports Medicine

## 2023-09-13 DIAGNOSIS — M48061 Spinal stenosis, lumbar region without neurogenic claudication: Secondary | ICD-10-CM | POA: Diagnosis not present

## 2023-09-16 ENCOUNTER — Telehealth: Payer: Self-pay | Admitting: Family Medicine

## 2023-09-16 DIAGNOSIS — Z1211 Encounter for screening for malignant neoplasm of colon: Secondary | ICD-10-CM

## 2023-09-16 DIAGNOSIS — H2512 Age-related nuclear cataract, left eye: Secondary | ICD-10-CM | POA: Diagnosis not present

## 2023-09-16 DIAGNOSIS — H43811 Vitreous degeneration, right eye: Secondary | ICD-10-CM | POA: Diagnosis not present

## 2023-09-16 DIAGNOSIS — H401130 Primary open-angle glaucoma, bilateral, stage unspecified: Secondary | ICD-10-CM | POA: Diagnosis not present

## 2023-09-16 DIAGNOSIS — E113213 Type 2 diabetes mellitus with mild nonproliferative diabetic retinopathy with macular edema, bilateral: Secondary | ICD-10-CM | POA: Diagnosis not present

## 2023-09-16 NOTE — Telephone Encounter (Signed)
 Call pt: due for cologuard.

## 2023-09-18 NOTE — Telephone Encounter (Signed)
 LVM advising pt that she is OVERDUE for cologuard.   Will send my chart advising her of this also.

## 2023-09-19 ENCOUNTER — Telehealth: Payer: Self-pay

## 2023-09-19 DIAGNOSIS — Z1211 Encounter for screening for malignant neoplasm of colon: Secondary | ICD-10-CM

## 2023-09-19 NOTE — Telephone Encounter (Signed)
 Left message advising a return call. She is due for another Cologuard.

## 2023-09-20 NOTE — Telephone Encounter (Signed)
 Cologuard ordered

## 2023-10-04 ENCOUNTER — Encounter: Payer: Self-pay | Admitting: Family Medicine

## 2023-10-10 ENCOUNTER — Ambulatory Visit: Admitting: Sports Medicine

## 2023-10-10 ENCOUNTER — Other Ambulatory Visit (INDEPENDENT_AMBULATORY_CARE_PROVIDER_SITE_OTHER)

## 2023-10-10 DIAGNOSIS — M170 Bilateral primary osteoarthritis of knee: Secondary | ICD-10-CM

## 2023-10-10 MED ORDER — HYALURONAN 30 MG/2ML IX SOSY
30.0000 mg | PREFILLED_SYRINGE | Freq: Once | INTRA_ARTICULAR | Status: AC
  Administered 2023-10-10: 30 mg via INTRA_ARTICULAR

## 2023-10-10 NOTE — Addendum Note (Signed)
 Addended by: Samule Dry on: 10/10/2023 03:22 PM   Modules accepted: Orders

## 2023-10-10 NOTE — Assessment & Plan Note (Signed)
 This is an exquisitely pleasant 73 year old female with known bilateral knee osteoarthritis, not a candidate currently for arthroplasty due to BMI. She did Orthovisc back in the summertime, she did really well not just in her pain but also noted dramatic improvements in her ADLs with physical therapy and Orthovisc. Today we are going to restart Orthovisc No. 1 of 4 both knees, return in 1 week for #2 of 4. I would suggest continued physical therapy. Return to see me a week.

## 2023-10-10 NOTE — Progress Notes (Signed)
    Procedures performed today:    Procedure: Real-time Ultrasound Guided injection of the left knee Device: Samsung HS60  Verbal informed consent obtained.  Time-out conducted.  Noted no overlying erythema, induration, or other signs of local infection.  Skin prepped in a sterile fashion.  Local anesthesia: Topical Ethyl chloride.  With sterile technique and under real time ultrasound guidance: 30 mg/2 mL of OrthoVisc (sodium hyaluronate) in a prefilled syringe was injected easily into the knee through a 22-gauge needle. Completed without difficulty  Advised to call if fevers/chills, erythema, induration, drainage, or persistent bleeding.  Images permanently stored and available for review in PACS.  Impression: Technically successful ultrasound guided injection.   Procedure: Real-time Ultrasound Guided injection of the right knee Device: Samsung HS60  Verbal informed consent obtained.  Time-out conducted.  Noted no overlying erythema, induration, or other signs of local infection.  Skin prepped in a sterile fashion.  Local anesthesia: Topical Ethyl chloride.  With sterile technique and under real time ultrasound guidance: 30 mg/2 mL of OrthoVisc (sodium hyaluronate) in a prefilled syringe was injected easily into the knee through a 22-gauge needle. Completed without difficulty  Advised to call if fevers/chills, erythema, induration, drainage, or persistent bleeding.  Images permanently stored and available for review in PACS.  Impression: Technically successful ultrasound guided injection.  Independent interpretation of notes and tests performed by another provider:   None.  Brief History, Exam, Impression, and Recommendations:    Primary osteoarthritis of both knees This is an exquisitely pleasant 73 year old female with known bilateral knee osteoarthritis, not a candidate currently for arthroplasty due to BMI. She did Orthovisc back in the summertime, she did really well not  just in her pain but also noted dramatic improvements in her ADLs with physical therapy and Orthovisc. Today we are going to restart Orthovisc No. 1 of 4 both knees, return in 1 week for #2 of 4. I would suggest continued physical therapy. Return to see me a week.    ____________________________________________ Ihor Austin. Benjamin Stain, M.D., ABFM., CAQSM., AME. Primary Care and Sports Medicine [facility]  Adjunct Professor of Family Medicine  [employer]  Restaurant manager, fast food

## 2023-10-11 ENCOUNTER — Ambulatory Visit (INDEPENDENT_AMBULATORY_CARE_PROVIDER_SITE_OTHER): Payer: Medicare Other | Admitting: Family Medicine

## 2023-10-11 ENCOUNTER — Encounter: Payer: Self-pay | Admitting: Family Medicine

## 2023-10-11 VITALS — BP 132/42 | HR 61 | Ht 61.0 in | Wt 280.0 lb

## 2023-10-11 DIAGNOSIS — I10 Essential (primary) hypertension: Secondary | ICD-10-CM

## 2023-10-11 DIAGNOSIS — M47816 Spondylosis without myelopathy or radiculopathy, lumbar region: Secondary | ICD-10-CM | POA: Diagnosis not present

## 2023-10-11 DIAGNOSIS — Z7984 Long term (current) use of oral hypoglycemic drugs: Secondary | ICD-10-CM | POA: Diagnosis not present

## 2023-10-11 DIAGNOSIS — R29898 Other symptoms and signs involving the musculoskeletal system: Secondary | ICD-10-CM

## 2023-10-11 DIAGNOSIS — E1149 Type 2 diabetes mellitus with other diabetic neurological complication: Secondary | ICD-10-CM

## 2023-10-11 DIAGNOSIS — M170 Bilateral primary osteoarthritis of knee: Secondary | ICD-10-CM

## 2023-10-11 LAB — POCT GLYCOSYLATED HEMOGLOBIN (HGB A1C): Hemoglobin A1C: 6.8 % — AB (ref 4.0–5.6)

## 2023-10-11 NOTE — Progress Notes (Signed)
 Established Patient Office Visit  Subjective  Patient ID: Natalie Wright, female    DOB: Jul 14, 1950  Age: 73 y.o. MRN: [id number]  Chief Complaint  Patient presents with   Diabetes   Hypertension    HPI 3 mo f/u DM/HTN. Made a change to [facility] since she had plateaued on the Trulicity.  She says so far she has been tolerating it well she gets a little bit of nausea but it is mild.  She says her only concern is that she has almost no appetite some days she does not even want to eat until 3:00..  A lot of days she is actually completely stopped her insulin.  She did let me know she has not started the Dexcom yet.  She still using the fingersticks but plans to switch over soon.  Still has a goal of having the surgery in about 6 months but she will need to get down to 220 pounds.  The only thing she still struggling with is her tea and soda but she is working on it.  She had spoken with Dr. Karie Schwalbe about getting back into PT he had recommended that and she thinks it would be helpful and would like to do so.     ROS    Objective:     BP (!) 132/42   Pulse 61   Ht 5\' 1"  (1.549 m)   Wt 280 lb (127 kg)   SpO2 100%   BMI 52.91 kg/m    Physical Exam Vitals and nursing note reviewed.  Constitutional:      Appearance: Normal appearance.  HENT:     Head: Normocephalic and atraumatic.  Eyes:     Conjunctiva/sclera: Conjunctivae normal.  Cardiovascular:     Rate and Rhythm: Normal rate and regular rhythm.  Pulmonary:     Effort: Pulmonary effort is normal.     Breath sounds: Normal breath sounds.  Skin:    General: Skin is warm and dry.  Neurological:     Mental Status: She is alert.  Psychiatric:        Mood and Affect: Mood normal.      Results for orders placed or performed in visit on 10/11/23  POCT HgB A1C  Result Value Ref Range   Hemoglobin A1C 6.8 (A) 4.0 - 5.6 %   HbA1c POC (<> result, manual entry)     HbA1c, POC (prediabetic range)     HbA1c, POC (controlled  diabetic range)        The 10-year ASCVD risk score (Arnett DK, et al., 2019) is: 24%    Assessment & Plan:   Problem List Items Addressed This Visit       Cardiovascular and Mediastinum   Essential hypertension, benign   Blood pressure looks great.  Will continue current regimen.      Relevant Orders   CMP14+EGFR   Lipid panel   CBC     Endocrine   Type 2 diabetes mellitus with neurological complications (HCC) - Primary   When I last saw her we decided to switch from Trulicity to [facility] she had lost a little over 100 pounds but was plateauing and her A1c had started to creep up again.  She is doing great.  Rarely using her insulin.  On the keep it on her med list for now but will likely discontinue that at next visit we might even be able to work on decreasing or stopping the Jardiance/metformin as well continue to work on cutting out  tea and soda.  I did discuss getting in protein snacks during the day to keep blood sugar stable and also help keep muscles from atrophying.      Relevant Orders   CMP14+EGFR   Lipid panel   CBC   POCT HgB A1C (Completed)     Musculoskeletal and Integument   Primary osteoarthritis of both knees   Get her back in for another round of PT I think it will be helpful for her especially now that she is losing additional weight to keep her strength and make sure that she will be good to go in the fall for knee replacement surgery.      Relevant Orders   Ambulatory referral to Physical Therapy   Lumbar spondylosis   Relevant Orders   Ambulatory referral to Physical Therapy   Other Visit Diagnoses       Weakness of both lower extremities       Relevant Orders   Ambulatory referral to Physical Therapy       Return in about 3 months (around 01/10/2024) for Diabetes follow-up.    [name], MD

## 2023-10-11 NOTE — Assessment & Plan Note (Signed)
 Blood pressure looks great.  Will continue current regimen.

## 2023-10-11 NOTE — Assessment & Plan Note (Signed)
 Get her back in for another round of PT I think it will be helpful for her especially now that she is losing additional weight to keep her strength and make sure that she will be good to go in the fall for knee replacement surgery.

## 2023-10-11 NOTE — Assessment & Plan Note (Addendum)
 When I last saw her we decided to switch from Trulicity to [facility] she had lost a little over 100 pounds but was plateauing and her A1c had started to creep up again.  She is doing great.  Rarely using her insulin.  On the keep it on her med list for now but will likely discontinue that at next visit we might even be able to work on decreasing or stopping the Jardiance/metformin as well continue to work on cutting out tea and soda.  I did discuss getting in protein snacks during the day to keep blood sugar stable and also help keep muscles from atrophying.

## 2023-10-12 LAB — CMP14+EGFR
ALT: 10 IU/L (ref 0–32)
AST: 17 IU/L (ref 0–40)
Albumin: 4.1 g/dL (ref 3.8–4.8)
Alkaline Phosphatase: 75 IU/L (ref 44–121)
BUN/Creatinine Ratio: 23 (ref 12–28)
BUN: 22 mg/dL (ref 8–27)
Bilirubin Total: 0.4 mg/dL (ref 0.0–1.2)
CO2: 25 mmol/L (ref 20–29)
Calcium: 9.7 mg/dL (ref 8.7–10.3)
Chloride: 97 mmol/L (ref 96–106)
Creatinine, Ser: 0.96 mg/dL (ref 0.57–1.00)
Globulin, Total: 2.2 g/dL (ref 1.5–4.5)
Glucose: 123 mg/dL — ABNORMAL HIGH (ref 70–99)
Potassium: 4.4 mmol/L (ref 3.5–5.2)
Sodium: 138 mmol/L (ref 134–144)
Total Protein: 6.3 g/dL (ref 6.0–8.5)
eGFR: 63 mL/min/{1.73_m2} (ref >59)

## 2023-10-12 LAB — CBC
Hematocrit: 42.5 % (ref 34.0–46.6)
Hemoglobin: 14.2 g/dL (ref 11.1–15.9)
MCH: 30.1 pg (ref 26.6–33.0)
MCHC: 33.4 g/dL (ref 31.5–35.7)
MCV: 90 fL (ref 79–97)
Platelets: 341 10*3/uL (ref 150–450)
RBC: 4.71 x10E6/uL (ref 3.77–5.28)
RDW: 12.3 % (ref 11.7–15.4)
WBC: 9.3 10*3/uL (ref 3.4–10.8)

## 2023-10-12 LAB — LIPID PANEL
Chol/HDL Ratio: 3.3 ratio (ref 0.0–4.4)
Cholesterol, Total: 153 mg/dL (ref 100–199)
HDL: 47 mg/dL (ref >39)
LDL Chol Calc (NIH): 81 mg/dL (ref 0–99)
Triglycerides: 143 mg/dL (ref 0–149)
VLDL Cholesterol Cal: 25 mg/dL (ref 5–40)

## 2023-10-14 ENCOUNTER — Encounter: Payer: Self-pay | Admitting: Family Medicine

## 2023-10-14 DIAGNOSIS — H25812 Combined forms of age-related cataract, left eye: Secondary | ICD-10-CM | POA: Diagnosis not present

## 2023-10-14 DIAGNOSIS — E113213 Type 2 diabetes mellitus with mild nonproliferative diabetic retinopathy with macular edema, bilateral: Secondary | ICD-10-CM | POA: Diagnosis not present

## 2023-10-14 DIAGNOSIS — H02831 Dermatochalasis of right upper eyelid: Secondary | ICD-10-CM | POA: Diagnosis not present

## 2023-10-14 DIAGNOSIS — H526 Other disorders of refraction: Secondary | ICD-10-CM | POA: Diagnosis not present

## 2023-10-14 DIAGNOSIS — H11153 Pinguecula, bilateral: Secondary | ICD-10-CM | POA: Diagnosis not present

## 2023-10-14 DIAGNOSIS — H02834 Dermatochalasis of left upper eyelid: Secondary | ICD-10-CM | POA: Diagnosis not present

## 2023-10-14 DIAGNOSIS — H40023 Open angle with borderline findings, high risk, bilateral: Secondary | ICD-10-CM | POA: Diagnosis not present

## 2023-10-14 LAB — OPHTHALMOLOGY REPORT-SCANNED

## 2023-10-14 NOTE — Progress Notes (Signed)
 Natalie Wright, potassium looks better this time as well as chloride which is great.  Total cholesterol and LDL are okay.  Just keep taking the Crestor 10 mg.  Blood counts normal no sign of anemia.  The 10-year ASCVD risk score (Arnett DK, et al., 2019) is: 23.7%   Values used to calculate the score:     Age: 73 years     Sex: Female     Is Non-Hispanic African American: Yes     Diabetic: Yes     Tobacco smoker: No     Systolic Blood Pressure: 132 mmHg     Is BP treated: Yes     HDL Cholesterol: 47 mg/dL     Total Cholesterol: 153 mg/dL

## 2023-10-15 DIAGNOSIS — L304 Erythema intertrigo: Secondary | ICD-10-CM | POA: Diagnosis not present

## 2023-10-15 DIAGNOSIS — L649 Androgenic alopecia, unspecified: Secondary | ICD-10-CM | POA: Diagnosis not present

## 2023-10-15 DIAGNOSIS — L811 Chloasma: Secondary | ICD-10-CM | POA: Diagnosis not present

## 2023-10-17 ENCOUNTER — Other Ambulatory Visit (INDEPENDENT_AMBULATORY_CARE_PROVIDER_SITE_OTHER)

## 2023-10-17 ENCOUNTER — Ambulatory Visit (INDEPENDENT_AMBULATORY_CARE_PROVIDER_SITE_OTHER): Admitting: Sports Medicine

## 2023-10-17 DIAGNOSIS — M170 Bilateral primary osteoarthritis of knee: Secondary | ICD-10-CM

## 2023-10-17 MED ORDER — HYALURONAN 30 MG/2ML IX SOSY
30.0000 mg | PREFILLED_SYRINGE | Freq: Once | INTRA_ARTICULAR | Status: AC
  Administered 2023-10-17: 30 mg via INTRA_ARTICULAR

## 2023-10-17 NOTE — Progress Notes (Signed)
    Procedures performed today:    Procedure: Real-time Ultrasound Guided injection of the left knee Device: Samsung HS60  Verbal informed consent obtained.  Time-out conducted.  Noted no overlying erythema, induration, or other signs of local infection.  Skin prepped in a sterile fashion.  Local anesthesia: Topical Ethyl chloride.  With sterile technique and under real time ultrasound guidance: 30 mg/2 mL of OrthoVisc (sodium hyaluronate) in a prefilled syringe was injected easily into the knee through a 22-gauge needle. Completed without difficulty  Advised to call if fevers/chills, erythema, induration, drainage, or persistent bleeding.  Images permanently stored and available for review in PACS.  Impression: Technically successful ultrasound guided injection.   Procedure: Real-time Ultrasound Guided injection of the right knee Device: Samsung HS60  Verbal informed consent obtained.  Time-out conducted.  Noted no overlying erythema, induration, or other signs of local infection.  Skin prepped in a sterile fashion.  Local anesthesia: Topical Ethyl chloride.  With sterile technique and under real time ultrasound guidance: 30 mg/2 mL of OrthoVisc (sodium hyaluronate) in a prefilled syringe was injected easily into the knee through a 22-gauge needle. Completed without difficulty  Advised to call if fevers/chills, erythema, induration, drainage, or persistent bleeding.  Images permanently stored and available for review in PACS.  Impression: Technically successful ultrasound guided injection.  Independent interpretation of notes and tests performed by another provider:   None.  Brief History, Exam, Impression, and Recommendations:    Primary osteoarthritis of both knees Dramatic improvement last year with Orthovisc, she was able to do her ADLs much more easily. Today we are doing Orthovisc No. 2 of 4 both knees, return in 1 week for #3 of  4.    ____________________________________________ Joselyn Nicely. Sandy Crumb, M.D., ABFM., CAQSM., AME. Primary Care and Sports Medicine [facility]  Adjunct Professor of [facility] Medicine  [employer]  Restaurant manager, fast food

## 2023-10-17 NOTE — Addendum Note (Signed)
 Addended by: OLIVA-AVELLANEDA, Sherrey North L on: 10/17/2023 11:41 AM   Modules accepted: Orders

## 2023-10-17 NOTE — Assessment & Plan Note (Signed)
 Dramatic improvement last year with Orthovisc, she was able to do her ADLs much more easily. Today we are doing Orthovisc No. 2 of 4 both knees, return in 1 week for #3 of 4.

## 2023-10-22 ENCOUNTER — Other Ambulatory Visit: Payer: Self-pay | Admitting: Family Medicine

## 2023-10-22 DIAGNOSIS — I10 Essential (primary) hypertension: Secondary | ICD-10-CM

## 2023-10-22 DIAGNOSIS — K219 Gastro-esophageal reflux disease without esophagitis: Secondary | ICD-10-CM

## 2023-10-24 ENCOUNTER — Ambulatory Visit (INDEPENDENT_AMBULATORY_CARE_PROVIDER_SITE_OTHER): Admitting: Sports Medicine

## 2023-10-24 ENCOUNTER — Other Ambulatory Visit (INDEPENDENT_AMBULATORY_CARE_PROVIDER_SITE_OTHER)

## 2023-10-24 ENCOUNTER — Encounter: Payer: Self-pay | Admitting: Sports Medicine

## 2023-10-24 DIAGNOSIS — M170 Bilateral primary osteoarthritis of knee: Secondary | ICD-10-CM

## 2023-10-24 MED ORDER — HYALURONAN 30 MG/2ML IX SOSY
30.0000 mg | PREFILLED_SYRINGE | Freq: Once | INTRA_ARTICULAR | Status: AC
Start: 2023-10-24 — End: 2023-10-24
  Administered 2023-10-24: 30 mg via INTRA_ARTICULAR

## 2023-10-24 MED ORDER — TRAMADOL HCL 50 MG PO TABS: 50.0000 mg | ORAL_TABLET | Freq: Every day | ORAL | 1 refills | Status: DC | PRN

## 2023-10-24 NOTE — Assessment & Plan Note (Addendum)
 Dramatic improvement with Orthovisc last year. Using tramadol  about once daily, sending off to her mail order prescription. We did Orthovisc 3 of 4 both knees today, return in 1 week for #4 of 4.

## 2023-10-24 NOTE — Progress Notes (Signed)
    Procedures performed today:    Procedure: Real-time Ultrasound Guided injection of the left knee Device: Samsung HS60  Verbal informed consent obtained.  Time-out conducted.  Noted no overlying erythema, induration, or other signs of local infection.  Skin prepped in a sterile fashion.  Local anesthesia: Topical Ethyl chloride.  With sterile technique and under real time ultrasound guidance: Trace effusion noted, 30 mg/2 mL of OrthoVisc (sodium hyaluronate) in a prefilled syringe was injected easily into the knee through a 22-gauge needle. Advised to call if fevers/chills, erythema, induration, drainage, or persistent bleeding.  Images permanently stored and available for review in PACS.  Impression: Technically successful ultrasound guided injection.  Procedure: Real-time Ultrasound Guided injection of the right knee Device: Samsung HS60  Verbal informed consent obtained.  Time-out conducted.  Noted no overlying erythema, induration, or other signs of local infection.  Skin prepped in a sterile fashion.  Local anesthesia: Topical Ethyl chloride.  With sterile technique and under real time ultrasound guidance: Trace effusion noted, 30 mg/2 mL of OrthoVisc (sodium hyaluronate) in a prefilled syringe was injected easily into the knee through a 22-gauge needle.   Completed without difficulty  Advised to call if fevers/chills, erythema, induration, drainage, or persistent bleeding.  Images permanently stored and available for review in PACS.  Impression: Technically successful ultrasound guided injection.  Independent interpretation of notes and tests performed by another provider:   None.  Brief History, Exam, Impression, and Recommendations:    Primary osteoarthritis of both knees Dramatic improvement with Orthovisc last year. Using tramadol  about once daily, sending off to her mail order prescription. We did Orthovisc 3 of 4 both knees today, return in 1 week for #4 of  4.    ____________________________________________ Natalie Wright. Sandy Crumb, M.D., ABFM., CAQSM., AME. Primary Care and Sports Medicine [facility]  Adjunct Professor of [facility] Medicine  [employer]  Restaurant manager, fast food

## 2023-10-24 NOTE — Addendum Note (Signed)
 Addended by: OLIVA-AVELLANEDA, Almando Brawley L on: 10/24/2023 12:03 PM   Modules accepted: Orders

## 2023-10-31 ENCOUNTER — Other Ambulatory Visit (INDEPENDENT_AMBULATORY_CARE_PROVIDER_SITE_OTHER)

## 2023-10-31 ENCOUNTER — Ambulatory Visit (INDEPENDENT_AMBULATORY_CARE_PROVIDER_SITE_OTHER): Admitting: Sports Medicine

## 2023-10-31 DIAGNOSIS — M170 Bilateral primary osteoarthritis of knee: Secondary | ICD-10-CM

## 2023-10-31 MED ORDER — HYALURONAN 30 MG/2ML IX SOSY
30.0000 mg | PREFILLED_SYRINGE | Freq: Once | INTRA_ARTICULAR | Status: AC
  Administered 2023-10-31: 30 mg via INTRA_ARTICULAR

## 2023-10-31 NOTE — Progress Notes (Signed)
    Procedures performed today:    Procedure: Real-time Ultrasound Guided injection of the left knee Device: Samsung HS60  Verbal informed consent obtained.  Time-out conducted.  Noted no overlying erythema, induration, or other signs of local infection.  Skin prepped in a sterile fashion.  Local anesthesia: Topical Ethyl chloride.  With sterile technique and under real time ultrasound guidance: Trace effusion noted, 30 mg/2 mL of OrthoVisc (sodium hyaluronate) in a prefilled syringe was injected easily into the knee through a 22-gauge needle. Advised to call if fevers/chills, erythema, induration, drainage, or persistent bleeding.  Images permanently stored and available for review in PACS.  Impression: Technically successful ultrasound guided injection.   Procedure: Real-time Ultrasound Guided injection of the right knee Device: Samsung HS60  Verbal informed consent obtained.  Time-out conducted.  Noted no overlying erythema, induration, or other signs of local infection.  Skin prepped in a sterile fashion.  Local anesthesia: Topical Ethyl chloride.  With sterile technique and under real time ultrasound guidance: Moderate effusion noted, 30 mg/2 mL of OrthoVisc (sodium hyaluronate) in a prefilled syringe was injected easily into the knee through a 22-gauge needle.   Completed without difficulty  Advised to call if fevers/chills, erythema, induration, drainage, or persistent bleeding.  Images permanently stored and available for review in PACS.  Impression: Technically successful ultrasound guided injection.  Independent interpretation of notes and tests performed by another provider:   None.  Brief History, Exam, Impression, and Recommendations:    Primary osteoarthritis of both knees This very pleasant 72 year old female returns, she did really well with Orthovisc last year, today we did Orthovisc No. 4 oh for both knees, she is doing a lot better, she uses tramadol  about once  a day, we can keep these refills going, her PCP ordered physical therapy, she really needs to start this and she can return to see me 6 weeks as needed.    ____________________________________________ Joselyn Nicely. Sandy Crumb, M.D., ABFM., CAQSM., AME. Primary Care and Sports Medicine [facility]  Adjunct Professor of [facility] Medicine  [employer]  Restaurant manager, fast food

## 2023-10-31 NOTE — Assessment & Plan Note (Signed)
 This very pleasant 73 year old female returns, she did really well with Orthovisc last year, today we did Orthovisc No. 4 oh for both knees, she is doing a lot better, she uses tramadol  about once a day, we can keep these refills going, her PCP ordered physical therapy, she really needs to start this and she can return to see me 6 weeks as needed.

## 2023-11-04 ENCOUNTER — Other Ambulatory Visit: Payer: Self-pay | Admitting: Sports Medicine

## 2023-11-04 DIAGNOSIS — M170 Bilateral primary osteoarthritis of knee: Secondary | ICD-10-CM

## 2023-11-14 NOTE — Telephone Encounter (Signed)

## 2023-11-18 NOTE — Therapy (Signed)
 OUTPATIENT PHYSICAL THERAPY LOWER EXTREMITY EVALUATION   Patient Name: Natalie Wright MRN: [id number] DOB:04-Jul-1950, 73 y.o., female Today's Date: 11/18/2023  END OF SESSION:   Past Medical History:  Diagnosis Date   Allergy    to cats and dogs   Anemia    Arthritis    Cataract    Diabetes mellitus    GERD (gastroesophageal reflux disease)    Glaucoma    Hiatal hernia    and delayed gastric emptying/ sees [facility] for recurrent diarrhea   History of cardiovascular disorder 12/10/2008   Qualifier: Diagnosis of  By: Greer Leak MD, Catherine     Hypertension    OSA (obstructive sleep apnea)    CPAP   Pedal edema    Sleep apnea    Spastic bladder    urology in [place]   TIA (transient ischemic attack)    hx of- sees Dr Chriss Coup (Neurology)   Past Surgical History:  Procedure Laterality Date   CATARACT EXTRACTION  08/2021   IR ANGIOGRAM SELECTIVE EACH ADDITIONAL VESSEL  04/19/2023   IR ANGIOGRAM SELECTIVE EACH ADDITIONAL VESSEL  04/19/2023   IR ANGIOGRAM SELECTIVE EACH ADDITIONAL VESSEL  04/19/2023   IR EMBO ARTERIAL NOT HEMORR HEMANG INC GUIDE ROADMAPPING  04/19/2023   IR EMBO ARTERIAL NOT HEMORR HEMANG INC GUIDE ROADMAPPING  06/07/2023   IR RADIOLOGIST EVAL & MGMT  03/08/2023   IR RADIOLOGIST EVAL & MGMT  05/20/2023   IR RADIOLOGIST EVAL & MGMT  07/19/2023   IR US  GUIDE VASC ACCESS RIGHT  04/19/2023   TUBAL LIGATION     vaginal skin tag removal     Patient Active Problem List   Diagnosis Date Noted   BMI 50.0-59.9, adult (HCC) 06/04/2023   Diabetic retinopathy (HCC) 02/26/2023   Lumbar spondylosis 07/03/2022   Lumbar spinal stenosis 02/26/2022   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/2022   Bilateral hip pain 01/12/2022   Hearing loss due to cerumen impaction, right 01/12/2022   Hammertoe, bilateral 08/23/2021   History of foot ulcer 08/23/2021   Primary osteoarthritis of both knees 12/09/2020   History of burning pain in leg 08/29/2020   Type 2 diabetes  mellitus with neurological complications (HCC) 08/29/2020   Tremor 05/05/2020   Pre-ulcerative corn or callous 01/29/2020   History of transient ischemic attack (TIA) 05/15/2018   Family history of ovarian cancer 04/02/2017   Aortic atherosclerosis (HCC) 08/31/2016   Venous stasis 08/10/2016   Palpitations 07/16/2016   Atrial dilatation, left 07/16/2016   Atrial septal aneurysm 07/16/2016   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   Cataract 04/18/2012   Hyperlipidemia 02/13/2011   Pulmonary nodules 09/22/2010   LEG PAIN 09/12/2009   Sleep apnea 04/13/2009   GERD 03/14/2009   Morbid obesity (HCC) 12/10/2008   Essential hypertension, benign 12/10/2008   RAYNAUD'S DISEASE 12/10/2008   Osteoarthritis 12/10/2008   URINARY INCONTINENCE 12/10/2008    PCP: Cydney Draft, MD   REFERRING PROVIDER: Cydney Draft, *   REFERRING DIAG: M17.0 (ICD-10-CM) - Primary osteoarthritis of both kneesM47.816 (ICD-10-CM) - Lumbar spondylosisR29.898 (ICD-10-CM) - Weakness of both lower extremities  THERAPY DIAG:  No diagnosis found.  Rationale for Evaluation and Treatment: Rehabilitation  ONSET DATE: lastest episode   SUBJECTIVE:   SUBJECTIVE STATEMENT: I just concluded orthovisc injections to my knees.  Had ablation in Dec/Jan which helped some with the popping and cracking. The knees still give out. I'm able to get in my car easier. The next week I started back with low back pain.  She has an order for a new low back injection. Her last one was in December. Lost 30# since January. MD concerned because I was not eating enough and I'm losing muscle mass. Now trying to eat more. I don't get hungry. Changed to [place]. She reports she is working to lose weight before she can be considered for TKRs. She uses a RW for ambulation. PERTINENT HISTORY: Diabetes, HTN, TIA, spinal stenosis, polymyalgia rheumatica  PAIN:  Are you having pain? Yes: NPRS scale: 6/10 in knees and back Pain location:  back and knees Pain description: "snap, crackle, pop" pain but less than previously, instability at knees where they give out, and neuropathy in her feet Aggravating factors: Raising B legs hurts the knees, trying to move after standing, pivoting to step  Relieving factors: nothing *pain also down R hip into thigh  PRECAUTIONS: Fall  RED FLAGS: None   WEIGHT BEARING RESTRICTIONS: No  FALLS:  Has patient fallen in last 6 months? No  LIVING ENVIRONMENT: Lives with: lives alone spouse passed away 05/23/22 Lives in: House/apartment Stairs: No Has following equipment at home: Quad cane large base and shower bench, grab bars, scooter (uses husband's scooter)  PLOF: declining mobility  PATIENT GOALS: continue to get stronger, more mobility and less comfort  NEXT MD VISIT: 01/14/24  OBJECTIVE:  Note: Objective measures were completed at Evaluation unless otherwise noted.  DIAGNOSTIC FINDINGS: none  PATIENT SURVEYS:  LEFS 29 / 80 = 36.3 %  COGNITION: Overall cognitive status: Within functional limits for tasks assessed     SENSATION: Notes neuropathy with some burning in feet, dec'd sensation  EDEMA:  Bilateral swelling noted in LEs    LOWER EXTREMITY ROM:  Active ROM Right eval Left eval  Knee flexion 108 103  Knee extension 0 0   (Blank rows = not tested)  LOWER EXTREMITY MMT:  MMT Right eval Left eval  Hip flexion 4+ 4+  Hip extension 5 4+  Hip abduction 4+ 4+  Hip adduction    Hip internal rotation    Hip external rotation    Knee flexion 5 5  Knee extension 5 5  Ankle dorsiflexion 5 5  Ankle plantarflexion    Ankle inversion    Ankle eversion     (Blank rows = not tested)  FUNCTIONAL TESTS:  5 times sit to stand: 21 sec 2 minute walk test: 1 min 20 sec  98 ft 4/10 pain (pain went down from 6/10)    GAIT: Distance walked: 98 Assistive device utilized: Walker - 2 wheeled Level of assistance: Modified independence Comments: forward trunk  lean                                                                                                                                TREATMENT DATE:   11/19/23  See pt ed and HEP   PATIENT EDUCATION:  Education details: PT eval findings, anticipated POC, HEP review, HEP  update, and discussion of aquatic PT, distribution of exercises throughout the day and getting enough protein in her diet. [email or web address]   Person educated: Patient Education method: Explanation, Demonstration, and Handouts Education comprehension: verbalized understanding and returned demonstration  HOME EXERCISE PROGRAM: Access Code: [email or web address] URL: [email or web address] Date: 11/19/2023 Prepared by: Concha Deed  Exercises - Supine Active Straight Leg Raise  - 1 x daily - 7 x weekly - 1 sets - 10 reps - Supine Bridge  - 2 x daily - 4 x weekly - 1 sets - 10 reps - Heel Toe Raises with Counter Support  - 2 x daily - 7 x weekly - 1 sets - 10 reps - Seated March  - 2 x daily - 7 x weekly - 1 sets - 10 reps - Sidelying hip Flexion and ABDuction  - 1 x daily - 3 x weekly - 2 sets - 10 reps  ASSESSMENT:  CLINICAL IMPRESSION: Patient is a 73 y.o. female who was seen today for physical therapy evaluation and treatment for weakness in B LE and chronic B knee and back pain. She was last seen in this clinic in February. She reports some improvements since that time. She is able to get into her car easier and she is having less popping in her knees. She is working to lose weight in order to get TKR surgery, however, her MD is concerned that she is losing muscle mass. She is limited with ambulation with her RW to 1 min and 20 sec. During that time she was able to walk 98 ft. Her LEFS 29/80. She also demonstrates B LE weakness. She will benefit from skilled PT to address these deficits.    OBJECTIVE IMPAIRMENTS: decreased activity tolerance, decreased mobility, decreased strength, postural dysfunction, obesity, and pain.   ACTIVITY  LIMITATIONS: sitting, standing, stairs, and locomotion level  PARTICIPATION LIMITATIONS: meal prep, cleaning, laundry, shopping, community activity, and church  PERSONAL FACTORS: Age, Fitness, Past/current experiences, Time since onset of injury/illness/exacerbation, and 3+ comorbidities: Diabetes, HTN, TIA, spinal stenosis, polymyalgia rheumatica  are also affecting patient's functional outcome.   REHAB POTENTIAL: Good  CLINICAL DECISION MAKING: Evolving/moderate complexity  EVALUATION COMPLEXITY: Moderate   GOALS: Goals reviewed with patient? Yes  SHORT TERM GOALS: Target date: 12/17/23 Ind with initial HEP Baseline: Goal status: INITIAL  2.  Able to complete 2 min walk test showing improved strength. Baseline: 1 min 20 sec/ 98 ft Goal status: INITIAL  LONG TERM GOALS: Target date: 01/14/24  Patient to demonstrate improved gait speed to >=1.6 ft/sec to demo improved community ambulation status Baseline: TBD Goal status: INITIAL  2.  Pt able to perform 5XSTS in < = to 16 seconds demonstrating improved strength Baseline: 21 sec Goal status: INITIAL  3.  Patient able to ambulate 300 feet without a sitting break to improve community ambulation. Baseline:  Goal status: INITIAL    PLAN:  PT FREQUENCY: 2x/week  PT DURATION: 8 weeks  PLANNED INTERVENTIONS: 97164- PT Re-evaluation, 97110-Therapeutic exercises, 97530- Therapeutic activity, 97112- Neuromuscular re-education, 97535- Self Care, 16109- Manual therapy, [phone]- Gait training, [phone]- Aquatic Therapy, Patient/Family education, Balance training, Stair training, Joint mobilization, DME instructions, Cryotherapy, and Moist heat  PLAN FOR NEXT SESSION: work on LE strength, sit to stand, gait endurance, Check gait speed for goal.   Jinx Mourning, PT  11/18/2023, 6:32 PM

## 2023-11-19 ENCOUNTER — Encounter: Payer: Self-pay | Admitting: Physical Therapy

## 2023-11-19 ENCOUNTER — Ambulatory Visit: Attending: Sports Medicine | Admitting: Physical Therapy

## 2023-11-19 ENCOUNTER — Other Ambulatory Visit: Payer: Self-pay

## 2023-11-19 DIAGNOSIS — M6281 Muscle weakness (generalized): Secondary | ICD-10-CM | POA: Insufficient documentation

## 2023-11-19 DIAGNOSIS — M47816 Spondylosis without myelopathy or radiculopathy, lumbar region: Secondary | ICD-10-CM | POA: Insufficient documentation

## 2023-11-19 DIAGNOSIS — R2689 Other abnormalities of gait and mobility: Secondary | ICD-10-CM | POA: Insufficient documentation

## 2023-11-19 DIAGNOSIS — R29898 Other symptoms and signs involving the musculoskeletal system: Secondary | ICD-10-CM | POA: Insufficient documentation

## 2023-11-19 DIAGNOSIS — M170 Bilateral primary osteoarthritis of knee: Secondary | ICD-10-CM | POA: Diagnosis not present

## 2023-11-19 DIAGNOSIS — M25562 Pain in left knee: Secondary | ICD-10-CM | POA: Diagnosis present

## 2023-11-19 DIAGNOSIS — G8929 Other chronic pain: Secondary | ICD-10-CM | POA: Diagnosis present

## 2023-11-19 DIAGNOSIS — M25561 Pain in right knee: Secondary | ICD-10-CM | POA: Insufficient documentation

## 2023-11-19 DIAGNOSIS — M5459 Other low back pain: Secondary | ICD-10-CM | POA: Insufficient documentation

## 2023-11-20 NOTE — Therapy (Signed)
 OUTPATIENT PHYSICAL THERAPY LOWER EXTREMITY TREATMENT   Patient Name: Natalie Wright MRN: [id number] DOB:1950-12-17, 73 y.o., female Today's Date: 11/21/2023  END OF SESSION:  PT End of Session - 11/21/23 1055     Visit Number 2    Date for PT Re-Evaluation 01/14/24    Authorization Type medicare and tricare    Progress Note Due on Visit 10    PT Start Time 1018    PT Stop Time 1057    PT Time Calculation (min) 39 min    Activity Tolerance Patient tolerated treatment well    Behavior During Therapy WFL for tasks assessed/performed             Past Medical History:  Diagnosis Date   Allergy    to cats and dogs   Anemia    Arthritis    Cataract    Diabetes mellitus    GERD (gastroesophageal reflux disease)    Glaucoma    Hiatal hernia    and delayed gastric emptying/ sees [facility] for recurrent diarrhea   History of cardiovascular disorder 12/10/2008   Qualifier: Diagnosis of  By: Greer Leak MD, Catherine     Hypertension    OSA (obstructive sleep apnea)    CPAP   Pedal edema    Sleep apnea    Spastic bladder    urology in [place]   TIA (transient ischemic attack)    hx of- sees Dr Chriss Coup (Neurology)   Past Surgical History:  Procedure Laterality Date   CATARACT EXTRACTION  08/2021   IR ANGIOGRAM SELECTIVE EACH ADDITIONAL VESSEL  04/19/2023   IR ANGIOGRAM SELECTIVE EACH ADDITIONAL VESSEL  04/19/2023   IR ANGIOGRAM SELECTIVE EACH ADDITIONAL VESSEL  04/19/2023   IR EMBO ARTERIAL NOT HEMORR HEMANG INC GUIDE ROADMAPPING  04/19/2023   IR EMBO ARTERIAL NOT HEMORR HEMANG INC GUIDE ROADMAPPING  06/07/2023   IR RADIOLOGIST EVAL & MGMT  03/08/2023   IR RADIOLOGIST EVAL & MGMT  05/20/2023   IR RADIOLOGIST EVAL & MGMT  07/19/2023   IR US  GUIDE VASC ACCESS RIGHT  04/19/2023   TUBAL LIGATION     vaginal skin tag removal     Patient Active Problem List   Diagnosis Date Noted   BMI 50.0-59.9, adult (HCC) 06/04/2023   Diabetic retinopathy (HCC) 02/26/2023   Lumbar  spondylosis 07/03/2022   Lumbar spinal stenosis 02/26/2022   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/2022   Bilateral hip pain 01/12/2022   Hearing loss due to cerumen impaction, right 01/12/2022   Hammertoe, bilateral 08/23/2021   History of foot ulcer 08/23/2021   Primary osteoarthritis of both knees 12/09/2020   History of burning pain in leg 08/29/2020   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   Tremor 05/05/2020   Pre-ulcerative corn or callous 01/29/2020   History of transient ischemic attack (TIA) 05/15/2018   Family history of ovarian cancer 04/02/2017   Aortic atherosclerosis (HCC) 08/31/2016   Venous stasis 08/10/2016   Palpitations 07/16/2016   Atrial dilatation, left 07/16/2016   Atrial septal aneurysm 07/16/2016   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   Cataract 04/18/2012   Hyperlipidemia 02/13/2011   Pulmonary nodules 09/22/2010   LEG PAIN 09/12/2009   Sleep apnea 04/13/2009   GERD 03/14/2009   Morbid obesity (HCC) 12/10/2008   Essential hypertension, benign 12/10/2008   RAYNAUD'S DISEASE 12/10/2008   Osteoarthritis 12/10/2008   URINARY INCONTINENCE 12/10/2008    PCP: Cydney Draft, MD   REFERRING PROVIDER: Gean Keels,*  REFERRING DIAG: M17.0 (ICD-10-CM) - Primary osteoarthritis of both kneesM47.816 (ICD-10-CM) - Lumbar spondylosisR29.898 (ICD-10-CM) - Weakness of both lower extremities  THERAPY DIAG:  Muscle weakness (generalized)  Chronic pain of right knee  Chronic pain of left knee  Other abnormalities of gait and mobility  Other low back pain  Rationale for Evaluation and Treatment: Rehabilitation  ONSET DATE: lastest episode   SUBJECTIVE:   SUBJECTIVE STATEMENT: I had a rough day yesterday. A lot of plans and cooking.   PERTINENT HISTORY: Diabetes, HTN, TIA, spinal stenosis, polymyalgia rheumatica  PAIN:  Are you having pain? Yes: NPRS scale: 6/10 in knees and back Pain location: back  and knees Pain description: "snap, crackle, pop" pain but less than previously, instability at knees where they give out, and neuropathy in her feet Aggravating factors: Raising B legs hurts the knees, trying to move after standing, pivoting to step  Relieving factors: nothing *pain also down R hip into thigh  PRECAUTIONS: Fall  RED FLAGS: None   WEIGHT BEARING RESTRICTIONS: No  FALLS:  Has patient fallen in last 6 months? No  LIVING ENVIRONMENT: Lives with: lives alone spouse passed away 01-Jun-2022 Lives in: House/apartment Stairs: No Has following equipment at home: Quad cane large base and shower bench, grab bars, scooter (uses husband's scooter)  PLOF: declining mobility  PATIENT GOALS: continue to get stronger, more mobility and less comfort  NEXT MD VISIT: 01/14/24  OBJECTIVE:  Note: Objective measures were completed at Evaluation unless otherwise noted.  DIAGNOSTIC FINDINGS: none  PATIENT SURVEYS:  LEFS 29 / 80 = 36.3 %  COGNITION: Overall cognitive status: Within functional limits for tasks assessed     SENSATION: Notes neuropathy with some burning in feet, dec'd sensation  EDEMA:  Bilateral swelling noted in LEs    LOWER EXTREMITY ROM:  Active ROM Right eval Left eval  Knee flexion 108 103  Knee extension 0 0   (Blank rows = not tested)  LOWER EXTREMITY MMT:  MMT Right eval Left eval  Hip flexion 4+ 4+  Hip extension 5 4+  Hip abduction 4+ 4+  Hip adduction    Hip internal rotation    Hip external rotation    Knee flexion 5 5  Knee extension 5 5  Ankle dorsiflexion 5 5  Ankle plantarflexion    Ankle inversion    Ankle eversion     (Blank rows = not tested)  FUNCTIONAL TESTS:  5 times sit to stand: 21 sec 2 minute walk test: 1 min 20 sec  98 ft 4/10 pain (pain went down from 6/10) Gait speed: 33 ft 29.88 seconds =  1.10 ft/sec  GAIT: Distance walked: 98 Assistive device utilized: Walker - 2 wheeled Level of assistance: Modified  independence Comments: forward trunk lean  [facility] Adult PT Treatment:                                                DATE: 11/21/23 Therapeutic Exercise: Seated marching x 20 ea Butt kickers x 20 ea  Standing hip ext (toe tap back) x10 (B) SLR 2 x 5 B S/L hip ABD 2 x 10 B Neuromuscular re-ed: Supine TA with GTB pull downs (PT holding band as anchor) for core strength 2x10 then 1x10 with alt march + pull down Therapeutic Activity: Gait speed  assessed 1.10 ft/sec Sit to stands x 5, then with 4# weights x 5 4" step up/down 1x5 (changing leading leg each), then 1x 3 Side stepping along stair railing Bridge 2 x 10    TREATMENT DATE:   11/19/23  See pt ed and HEP   PATIENT EDUCATION:  Education details: PT eval findings, anticipated POC, HEP review, HEP update, and discussion of aquatic PT, distribution of exercises throughout the day and getting enough protein in her diet. [email or web address]   Person educated: Patient Education method: Explanation, Demonstration, and Handouts Education comprehension: verbalized understanding and returned demonstration  HOME EXERCISE PROGRAM: Access Code: [email or web address] URL: [email or web address] Date: 11/19/2023 Prepared by: Concha Deed  Exercises - Supine Active Straight Leg Raise  - 1 x daily - 7 x weekly - 1 sets - 10 reps - Supine Bridge  - 2 x daily - 4 x weekly - 1 sets - 10 reps - Heel Toe Raises with Counter Support  - 2 x daily - 7 x weekly - 1 sets - 10 reps - Seated March  - 2 x daily - 7 x weekly - 1 sets - 10 reps - Sidelying hip Flexion and ABDuction  - 1 x daily - 3 x weekly - 2 sets - 10 reps  ASSESSMENT:  CLINICAL IMPRESSION: Patient was challenged with first session of therapy.  RPE after sitting and standing exercises reported as 6-7/10. Good tolerance overall.   Eval: Patient is a 73 y.o. female who was  seen today for physical therapy evaluation and treatment for weakness in B LE and chronic B knee and back pain. She was last seen in this clinic in February. She reports some improvements since that time. She is able to get into her car easier and she is having less popping in her knees. She is working to lose weight in order to get TKR surgery, however, her MD is concerned that she is losing muscle mass. She is limited with ambulation with her RW to 1 min and 20 sec. During that time she was able to walk 98 ft. Her LEFS 29/80. She also demonstrates B LE weakness. She will benefit from skilled PT to address these deficits.    OBJECTIVE IMPAIRMENTS: decreased activity tolerance, decreased mobility, decreased strength, postural dysfunction, obesity, and pain.   ACTIVITY LIMITATIONS: sitting, standing, stairs, and locomotion level  PARTICIPATION LIMITATIONS: meal prep, cleaning, laundry, shopping, community activity, and church  PERSONAL FACTORS: Age, Fitness, Past/current experiences, Time since onset of injury/illness/exacerbation, and 3+ comorbidities: Diabetes, HTN, TIA, spinal stenosis, polymyalgia rheumatica  are also affecting patient's functional outcome.   REHAB POTENTIAL: Good  CLINICAL DECISION MAKING: Evolving/moderate complexity  EVALUATION COMPLEXITY: Moderate   GOALS: Goals reviewed with patient? Yes  SHORT TERM GOALS: Target date: 12/17/23 Ind with initial HEP Baseline: Goal status: INITIAL  2.  Able to complete 2 min walk test showing improved strength. Baseline: 1 min 20 sec/ 98 ft Goal status: INITIAL  LONG TERM GOALS: Target date: 01/14/24  Patient to demonstrate improved gait speed to >=1.6 ft/sec to demo improved community ambulation status Baseline: 1.10 ft/sec 11/21/23 Goal status: INITIAL  2.  Pt able to perform 5XSTS in < = to 16 seconds demonstrating improved strength Baseline: 21 sec Goal status: INITIAL  3.  Patient able to ambulate 300 feet without a  sitting break to improve community ambulation. Baseline:  Goal status: INITIAL    PLAN:  PT FREQUENCY: 2x/week  PT DURATION: 8 weeks  PLANNED INTERVENTIONS: 97164- PT Re-evaluation, 97110-Therapeutic exercises, 97530- Therapeutic activity, [id number]- Neuromuscular re-education, 97535- Self Care, 16109- Manual therapy, [phone]- Gait training, [phone]- Aquatic Therapy, Patient/Family education, Balance training, Stair training, Joint mobilization, DME instructions, Cryotherapy, and Moist heat  PLAN FOR NEXT SESSION: work on LE strength, sit to stand, gait endurance.   Jinx Mourning, PT  11/21/2023, 11:01 AM

## 2023-11-21 ENCOUNTER — Ambulatory Visit: Admitting: Physical Therapy

## 2023-11-21 ENCOUNTER — Encounter: Payer: Self-pay | Admitting: Physical Therapy

## 2023-11-21 DIAGNOSIS — M6281 Muscle weakness (generalized): Secondary | ICD-10-CM

## 2023-11-21 DIAGNOSIS — R2689 Other abnormalities of gait and mobility: Secondary | ICD-10-CM | POA: Diagnosis not present

## 2023-11-21 DIAGNOSIS — M25562 Pain in left knee: Secondary | ICD-10-CM | POA: Diagnosis not present

## 2023-11-21 DIAGNOSIS — G8929 Other chronic pain: Secondary | ICD-10-CM | POA: Diagnosis not present

## 2023-11-21 DIAGNOSIS — M5459 Other low back pain: Secondary | ICD-10-CM | POA: Diagnosis not present

## 2023-11-21 DIAGNOSIS — M25561 Pain in right knee: Secondary | ICD-10-CM | POA: Diagnosis not present

## 2023-11-22 ENCOUNTER — Encounter: Payer: Self-pay | Admitting: Family Medicine

## 2023-11-25 NOTE — Therapy (Signed)
 OUTPATIENT PHYSICAL THERAPY LOWER EXTREMITY TREATMENT   Patient Name: Natalie Wright MRN: [id number] DOB:1951-06-30, 73 y.o., female Today's Date: 11/26/2023  END OF SESSION:  PT End of Session - 11/26/23 1108     Visit Number 3    Number of Visits 38    Date for PT Re-Evaluation 01/14/24    Authorization Type medicare and tricare    Progress Note Due on Visit 10    PT Start Time 1105    PT Stop Time 1145    PT Time Calculation (min) 40 min    Activity Tolerance Patient tolerated treatment well    Behavior During Therapy WFL for tasks assessed/performed              Past Medical History:  Diagnosis Date   Allergy    to cats and dogs   Anemia    Arthritis    Cataract    Diabetes mellitus    GERD (gastroesophageal reflux disease)    Glaucoma    Hiatal hernia    and delayed gastric emptying/ sees [facility] for recurrent diarrhea   History of cardiovascular disorder 12/10/2008   Qualifier: Diagnosis of  By: Greer Leak MD, Catherine     Hypertension    OSA (obstructive sleep apnea)    CPAP   Pedal edema    Sleep apnea    Spastic bladder    urology in [place]   TIA (transient ischemic attack)    hx of- sees Dr Chriss Coup (Neurology)   Past Surgical History:  Procedure Laterality Date   CATARACT EXTRACTION  08/2021   IR ANGIOGRAM SELECTIVE EACH ADDITIONAL VESSEL  04/19/2023   IR ANGIOGRAM SELECTIVE EACH ADDITIONAL VESSEL  04/19/2023   IR ANGIOGRAM SELECTIVE EACH ADDITIONAL VESSEL  04/19/2023   IR EMBO ARTERIAL NOT HEMORR HEMANG INC GUIDE ROADMAPPING  04/19/2023   IR EMBO ARTERIAL NOT HEMORR HEMANG INC GUIDE ROADMAPPING  06/07/2023   IR RADIOLOGIST EVAL & MGMT  03/08/2023   IR RADIOLOGIST EVAL & MGMT  05/20/2023   IR RADIOLOGIST EVAL & MGMT  07/19/2023   IR US  GUIDE VASC ACCESS RIGHT  04/19/2023   TUBAL LIGATION     vaginal skin tag removal     Patient Active Problem List   Diagnosis Date Noted   BMI 50.0-59.9, adult (HCC) 06/04/2023   Diabetic retinopathy  (HCC) 02/26/2023   Lumbar spondylosis 07/03/2022   Lumbar spinal stenosis 02/26/2022   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/2022   Bilateral hip pain 01/12/2022   Hearing loss due to cerumen impaction, right 01/12/2022   Hammertoe, bilateral 08/23/2021   History of foot ulcer 08/23/2021   Primary osteoarthritis of both knees 12/09/2020   History of burning pain in leg 08/29/2020   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   Tremor 05/05/2020   Pre-ulcerative corn or callous 01/29/2020   History of transient ischemic attack (TIA) 05/15/2018   Family history of ovarian cancer 04/02/2017   Aortic atherosclerosis (HCC) 08/31/2016   Venous stasis 08/10/2016   Palpitations 07/16/2016   Atrial dilatation, left 07/16/2016   Atrial septal aneurysm 07/16/2016   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   Cataract 04/18/2012   Hyperlipidemia 02/13/2011   Pulmonary nodules 09/22/2010   LEG PAIN 09/12/2009   Sleep apnea 04/13/2009   GERD 03/14/2009   Morbid obesity (HCC) 12/10/2008   Essential hypertension, benign 12/10/2008   RAYNAUD'S DISEASE 12/10/2008   Osteoarthritis 12/10/2008   URINARY INCONTINENCE 12/10/2008    PCP: Cydney Draft, MD  REFERRING PROVIDER: Cydney Draft, *   REFERRING DIAG: M17.0 (ICD-10-CM) - Primary osteoarthritis of both kneesM47.816 (ICD-10-CM) - Lumbar spondylosisR29.898 (ICD-10-CM) - Weakness of both lower extremities  THERAPY DIAG:  Chronic pain of right knee  Chronic pain of left knee  Other abnormalities of gait and mobility  Rationale for Evaluation and Treatment: Rehabilitation  ONSET DATE: lastest episode   SUBJECTIVE:   SUBJECTIVE STATEMENT: Had some increased back pain after last session.  PERTINENT HISTORY: Diabetes, HTN, TIA, spinal stenosis, polymyalgia rheumatica  PAIN:  Are you having pain? Yes: NPRS scale: 6/10 in knees and back Pain location: back and knees Pain description: "snap,  crackle, pop" pain but less than previously, instability at knees where they give out, and neuropathy in her feet Aggravating factors: Raising B legs hurts the knees, trying to move after standing, pivoting to step  Relieving factors: nothing *pain also down R hip into thigh  PRECAUTIONS: Fall  RED FLAGS: None   WEIGHT BEARING RESTRICTIONS: No  FALLS:  Has patient fallen in last 6 months? No  LIVING ENVIRONMENT: Lives with: lives alone spouse passed away 05-10-2022 Lives in: House/apartment Stairs: No Has following equipment at home: Quad cane large base and shower bench, grab bars, scooter (uses husband's scooter)  PLOF: declining mobility  PATIENT GOALS: continue to get stronger, more mobility and less comfort  NEXT MD VISIT: 01/14/24  OBJECTIVE:  Note: Objective measures were completed at Evaluation unless otherwise noted.  DIAGNOSTIC FINDINGS: none  PATIENT SURVEYS:  LEFS 29 / 80 = 36.3 %  COGNITION: Overall cognitive status: Within functional limits for tasks assessed     SENSATION: Notes neuropathy with some burning in feet, dec'd sensation  EDEMA:  Bilateral swelling noted in LEs    LOWER EXTREMITY ROM:  Active ROM Right eval Left eval  Knee flexion 108 103  Knee extension 0 0   (Blank rows = not tested)  LOWER EXTREMITY MMT:  MMT Right eval Left eval  Hip flexion 4+ 4+  Hip extension 5 4+  Hip abduction 4+ 4+  Hip adduction    Hip internal rotation    Hip external rotation    Knee flexion 5 5  Knee extension 5 5  Ankle dorsiflexion 5 5  Ankle plantarflexion    Ankle inversion    Ankle eversion     (Blank rows = not tested)  FUNCTIONAL TESTS:  5 times sit to stand: 21 sec 2 minute walk test: 1 min 20 sec  98 ft 4/10 pain (pain went down from 6/10) Gait speed: 33 ft 29.88 seconds =  1.10 ft/sec  GAIT: Distance walked: 98 Assistive device utilized: Walker - 2 wheeled Level of assistance: Modified independence Comments: forward trunk  lean                                                                                                                                [facility] Adult PT Treatment:  DATE: 11/26/23 Therapeutic Exercise: Seated marching x 20 ea Seated clam BTB x 20 ea Butt kickers x 20 ea  SLR 2 x 5 B S/L hip ABD and S/L ext 1 x 10 each B  Therapeutic Activity: Sit to stands x 10 4" step up/down x 10 B Side stepping along stair railing Step out laterally to dot to avoid twisting B x 10 Bridge 2 x 10 separated by other exercises   [facility] Adult PT Treatment:                                                DATE: 11/21/23 Therapeutic Exercise: Seated marching x 20 ea Butt kickers x 20 ea  Standing hip ext (toe tap back) x10 (B) SLR 2 x 5 B S/L hip ABD 2 x 10 B Neuromuscular re-ed: Supine TA with GTB pull downs (PT holding band as anchor) for core strength 2x10 then 1x10 with alt march + pull down Therapeutic Activity: Gait speed  assessed 1.10 ft/sec Sit to stands x 5, then with 4# weights x 5 4" step up/down 1x5 (changing leading leg each), then 1x 3 Side stepping along stair railing Bridge 2 x 10    TREATMENT DATE:   11/19/23  See pt ed and HEP   PATIENT EDUCATION:  Education details: PT eval findings, anticipated POC, HEP review, HEP update, and discussion of aquatic PT, distribution of exercises throughout the day and getting enough protein in her diet. [email or web address]   Person educated: Patient Education method: Explanation, Demonstration, and Handouts Education comprehension: verbalized understanding and returned demonstration  HOME EXERCISE PROGRAM: Access Code: [email or web address] URL: [email or web address] Date: 11/19/2023 Prepared by: Concha Deed  Exercises - Supine Active Straight Leg Raise  - 1 x daily - 7 x weekly - 1 sets - 10 reps - Supine Bridge  - 2 x daily - 4 x weekly - 1 sets - 10 reps - Heel Toe Raises with Counter Support  - 2 x daily - 7  x weekly - 1 sets - 10 reps - Seated March  - 2 x daily - 7 x weekly - 1 sets - 10 reps - Sidelying hip Flexion and ABDuction  - 1 x daily - 3 x weekly - 2 sets - 10 reps  ASSESSMENT:  CLINICAL IMPRESSION: Patient with increased pain after last session, but she is unsure if it was the exercises or the polymyalgia. She fatigued a little easier today requiring some short rest breaks. Reports that she gets a little light headed sometimes. Vitals checked and WNL.   Eval: Patient is a 73 y.o. female who was seen today for physical therapy evaluation and treatment for weakness in B LE and chronic B knee and back pain. She was last seen in this clinic in February. She reports some improvements since that time. She is able to get into her car easier and she is having less popping in her knees. She is working to lose weight in order to get TKR surgery, however, her MD is concerned that she is losing muscle mass. She is limited with ambulation with her RW to 1 min and 20 sec. During that time she was able to walk 98 ft. Her LEFS 29/80. She also demonstrates B LE weakness. She will benefit from skilled PT to address these deficits.    OBJECTIVE IMPAIRMENTS: decreased activity tolerance, decreased mobility,  decreased strength, postural dysfunction, obesity, and pain.   ACTIVITY LIMITATIONS: sitting, standing, stairs, and locomotion level  PARTICIPATION LIMITATIONS: meal prep, cleaning, laundry, shopping, community activity, and church  PERSONAL FACTORS: Age, Fitness, Past/current experiences, Time since onset of injury/illness/exacerbation, and 3+ comorbidities: Diabetes, HTN, TIA, spinal stenosis, polymyalgia rheumatica  are also affecting patient's functional outcome.   REHAB POTENTIAL: Good  CLINICAL DECISION MAKING: Evolving/moderate complexity  EVALUATION COMPLEXITY: Moderate   GOALS: Goals reviewed with patient? Yes  SHORT TERM GOALS: Target date: 12/17/23 Ind with initial HEP Baseline: Goal  status: INITIAL  2.  Able to complete 2 min walk test showing improved strength. Baseline: 1 min 20 sec/ 98 ft Goal status: INITIAL  LONG TERM GOALS: Target date: 01/14/24  Patient to demonstrate improved gait speed to >=1.6 ft/sec to demo improved community ambulation status Baseline: 1.10 ft/sec 11/21/23 Goal status: INITIAL  2.  Pt able to perform 5XSTS in < = to 16 seconds demonstrating improved strength Baseline: 21 sec Goal status: INITIAL  3.  Patient able to ambulate 300 feet without a sitting break to improve community ambulation. Baseline:  Goal status: INITIAL    PLAN:  PT FREQUENCY: 2x/week  PT DURATION: 8 weeks  PLANNED INTERVENTIONS: 97164- PT Re-evaluation, 97110-Therapeutic exercises, 97530- Therapeutic activity, [id number]- Neuromuscular re-education, 97535- Self Care, 52841- Manual therapy, [phone]- Gait training, [phone]- Aquatic Therapy, Patient/Family education, Balance training, Stair training, Joint mobilization, DME instructions, Cryotherapy, and Moist heat  PLAN FOR NEXT SESSION: work on LE strength, sit to stand, gait endurance.   Jinx Mourning, PT  11/26/2023, 12:53 PM

## 2023-11-26 ENCOUNTER — Ambulatory Visit: Admitting: Physical Therapy

## 2023-11-26 ENCOUNTER — Encounter: Payer: Self-pay | Admitting: Physical Therapy

## 2023-11-26 VITALS — BP 114/71 | HR 88

## 2023-11-26 DIAGNOSIS — G8929 Other chronic pain: Secondary | ICD-10-CM

## 2023-11-26 DIAGNOSIS — M25561 Pain in right knee: Secondary | ICD-10-CM | POA: Diagnosis not present

## 2023-11-26 DIAGNOSIS — M25562 Pain in left knee: Secondary | ICD-10-CM | POA: Diagnosis not present

## 2023-11-26 DIAGNOSIS — R2689 Other abnormalities of gait and mobility: Secondary | ICD-10-CM

## 2023-11-26 DIAGNOSIS — M5459 Other low back pain: Secondary | ICD-10-CM | POA: Diagnosis not present

## 2023-11-26 DIAGNOSIS — M6281 Muscle weakness (generalized): Secondary | ICD-10-CM | POA: Diagnosis not present

## 2023-11-28 ENCOUNTER — Encounter: Admitting: Physical Therapy

## 2023-12-03 ENCOUNTER — Ambulatory Visit: Attending: Sports Medicine

## 2023-12-03 DIAGNOSIS — R2689 Other abnormalities of gait and mobility: Secondary | ICD-10-CM | POA: Diagnosis present

## 2023-12-03 DIAGNOSIS — G8929 Other chronic pain: Secondary | ICD-10-CM | POA: Diagnosis present

## 2023-12-03 DIAGNOSIS — M5459 Other low back pain: Secondary | ICD-10-CM | POA: Diagnosis present

## 2023-12-03 DIAGNOSIS — M6281 Muscle weakness (generalized): Secondary | ICD-10-CM | POA: Diagnosis present

## 2023-12-03 DIAGNOSIS — M25562 Pain in left knee: Secondary | ICD-10-CM | POA: Insufficient documentation

## 2023-12-03 DIAGNOSIS — M25561 Pain in right knee: Secondary | ICD-10-CM | POA: Diagnosis not present

## 2023-12-03 NOTE — Therapy (Signed)
 OUTPATIENT PHYSICAL THERAPY LOWER EXTREMITY TREATMENT   Patient Name: Natalie Wright MRN: [id number] DOB:02-16-1951, 73 y.o., female Today's Date: 12/03/2023  END OF SESSION:  PT End of Session - 12/03/23 1617     Visit Number 4    Number of Visits 17    Date for PT Re-Evaluation 01/14/24    Authorization Type medicare and tricare    Progress Note Due on Visit 10    PT Start Time 1618    PT Stop Time 1658    PT Time Calculation (min) 40 min    Activity Tolerance Patient tolerated treatment well    Behavior During Therapy WFL for tasks assessed/performed               Past Medical History:  Diagnosis Date   Allergy    to cats and dogs   Anemia    Arthritis    Cataract    Diabetes mellitus    GERD (gastroesophageal reflux disease)    Glaucoma    Hiatal hernia    and delayed gastric emptying/ sees [facility] for recurrent diarrhea   History of cardiovascular disorder 12/10/2008   Qualifier: Diagnosis of  By: Greer Leak MD, Catherine     Hypertension    OSA (obstructive sleep apnea)    CPAP   Pedal edema    Sleep apnea    Spastic bladder    urology in [place]   TIA (transient ischemic attack)    hx of- sees Dr Chriss Coup (Neurology)   Past Surgical History:  Procedure Laterality Date   CATARACT EXTRACTION  08/2021   IR ANGIOGRAM SELECTIVE EACH ADDITIONAL VESSEL  04/19/2023   IR ANGIOGRAM SELECTIVE EACH ADDITIONAL VESSEL  04/19/2023   IR ANGIOGRAM SELECTIVE EACH ADDITIONAL VESSEL  04/19/2023   IR EMBO ARTERIAL NOT HEMORR HEMANG INC GUIDE ROADMAPPING  04/19/2023   IR EMBO ARTERIAL NOT HEMORR HEMANG INC GUIDE ROADMAPPING  06/07/2023   IR RADIOLOGIST EVAL & MGMT  03/08/2023   IR RADIOLOGIST EVAL & MGMT  05/20/2023   IR RADIOLOGIST EVAL & MGMT  07/19/2023   IR US  GUIDE VASC ACCESS RIGHT  04/19/2023   TUBAL LIGATION     vaginal skin tag removal     Patient Active Problem List   Diagnosis Date Noted   BMI 50.0-59.9, adult (HCC) 06/04/2023   Diabetic retinopathy  (HCC) 02/26/2023   Lumbar spondylosis 07/03/2022   Lumbar spinal stenosis 02/26/2022   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/2022   Bilateral hip pain 01/12/2022   Hearing loss due to cerumen impaction, right 01/12/2022   Hammertoe, bilateral 08/23/2021   History of foot ulcer 08/23/2021   Primary osteoarthritis of both knees 12/09/2020   History of burning pain in leg 08/29/2020   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   Tremor 05/05/2020   Pre-ulcerative corn or callous 01/29/2020   History of transient ischemic attack (TIA) 05/15/2018   Family history of ovarian cancer 04/02/2017   Aortic atherosclerosis (HCC) 08/31/2016   Venous stasis 08/10/2016   Palpitations 07/16/2016   Atrial dilatation, left 07/16/2016   Atrial septal aneurysm 07/16/2016   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   Cataract 04/18/2012   Hyperlipidemia 02/13/2011   Pulmonary nodules 09/22/2010   LEG PAIN 09/12/2009   Sleep apnea 04/13/2009   GERD 03/14/2009   Morbid obesity (HCC) 12/10/2008   Essential hypertension, benign 12/10/2008   RAYNAUD'S DISEASE 12/10/2008   Osteoarthritis 12/10/2008   URINARY INCONTINENCE 12/10/2008    PCP: Cydney Draft, MD  REFERRING PROVIDER: Cydney Draft, *   REFERRING DIAG: M17.0 (ICD-10-CM) - Primary osteoarthritis of both kneesM47.816 (ICD-10-CM) - Lumbar spondylosisR29.898 (ICD-10-CM) - Weakness of both lower extremities  THERAPY DIAG:  Chronic pain of right knee  Chronic pain of left knee  Other abnormalities of gait and mobility  Muscle weakness (generalized)  Other low back pain  Rationale for Evaluation and Treatment: Rehabilitation  ONSET DATE: lastest episode   SUBJECTIVE:   SUBJECTIVE STATEMENT: Patient reports she is sore from left hip to right hip from the polymyalgia. The knees are always hurting. Is scheduled for back injections on Monday.   PERTINENT HISTORY: Diabetes, HTN, TIA, spinal  stenosis, polymyalgia rheumatica  PAIN:  Are you having pain? Yes: NPRS scale: 5/10  Pain location: back, knees, hips  Pain description: sore Aggravating factors: Raising B legs hurts the knees, trying to move after standing, pivoting to step  Relieving factors: nothing   PRECAUTIONS: Fall  RED FLAGS: None   WEIGHT BEARING RESTRICTIONS: No  FALLS:  Has patient fallen in last 6 months? No  LIVING ENVIRONMENT: Lives with: lives alone spouse passed away 2022/06/04 Lives in: House/apartment Stairs: No Has following equipment at home: Quad cane large base and shower bench, grab bars, scooter (uses husband's scooter)  PLOF: declining mobility  PATIENT GOALS: continue to get stronger, more mobility and less comfort  NEXT MD VISIT: 01/14/24  OBJECTIVE:  Note: Objective measures were completed at Evaluation unless otherwise noted.  DIAGNOSTIC FINDINGS: none  PATIENT SURVEYS:  LEFS 29 / 80 = 36.3 %  COGNITION: Overall cognitive status: Within functional limits for tasks assessed     SENSATION: Notes neuropathy with some burning in feet, dec'd sensation  EDEMA:  Bilateral swelling noted in LEs    LOWER EXTREMITY ROM:  Active ROM Right eval Left eval  Knee flexion 108 103  Knee extension 0 0   (Blank rows = not tested)  LOWER EXTREMITY MMT:  MMT Right eval Left eval  Hip flexion 4+ 4+  Hip extension 5 4+  Hip abduction 4+ 4+  Hip adduction    Hip internal rotation    Hip external rotation    Knee flexion 5 5  Knee extension 5 5  Ankle dorsiflexion 5 5  Ankle plantarflexion    Ankle inversion    Ankle eversion     (Blank rows = not tested)  FUNCTIONAL TESTS:  5 times sit to stand: 21 sec 2 minute walk test: 1 min 20 sec  98 ft 4/10 pain (pain went down from 6/10) Gait speed: 33 ft 29.88 seconds =  1.10 ft/sec  GAIT: Distance walked: 98 Assistive device utilized: Environmental consultant - 2 wheeled Level of assistance: Modified independence Comments: forward trunk  lean   [facility] Adult PT Treatment:                                                DATE: 12/03/23 Therapeutic Exercise: LAQ 2 x 10 @ 1 lb Resisted HS curl green band 2 x 10  HEP review   Therapeutic Activity: Sit to stand x 5; 2 x 5 @ 5 lbs  Step taps 6 inch 2 x 10; with RW for support  Walking 2.5 minutes> 2 laps in clinic with RW   Self Care: Walking program to incorporate at home with handout provided  [facility] Adult PT Treatment:                                                DATE: 11/26/23 Therapeutic Exercise: Seated marching x 20 ea Seated clam BTB x 20 ea Butt kickers x 20 ea  SLR 2 x 5 B S/L hip ABD and S/L ext 1 x 10 each B  Therapeutic Activity: Sit to stands x 10 4" step up/down x 10 B Side stepping along stair railing Step out laterally to dot to avoid twisting B x 10 Bridge 2 x 10 separated by other exercises   [facility] Adult PT Treatment:                                                DATE: 11/21/23 Therapeutic Exercise: Seated marching x 20 ea Butt kickers x 20 ea  Standing hip ext (toe tap back) x10 (B) SLR 2 x 5 B S/L hip ABD 2 x 10 B Neuromuscular re-ed: Supine TA with GTB pull downs (PT holding band as anchor) for core strength 2x10 then 1x10 with alt march + pull down Therapeutic Activity: Gait speed  assessed 1.10 ft/sec Sit to stands x 5, then with 4# weights x 5 4" step up/down 1x5 (changing leading leg each), then 1x 3 Side stepping along stair railing Bridge 2 x 10    PATIENT EDUCATION:  Education details: PT eval findings, anticipated POC, HEP review, HEP update, and discussion of aquatic PT, distribution of exercises throughout the day and getting enough protein in her diet. [email or web address]   Person educated: Patient Education method: Explanation, Demonstration, and Handouts Education comprehension: verbalized understanding and  returned demonstration  HOME EXERCISE PROGRAM: Access Code: [email or web address] URL: [email or web address] Date: 12/03/2023 Prepared by: Forrestine Ike  Exercises - Supine Active Straight Leg Raise  - 1 x daily - 7 x weekly - 1 sets - 10 reps - Supine Bridge  - 2 x daily - 4 x weekly - 1 sets - 10 reps - Heel Toe Raises with Counter Support  - 2 x daily - 7 x weekly - 1 sets - 10 reps - Seated March  - 2 x daily - 7 x weekly - 1 sets - 10 reps - Sidelying hip Flexion and ABDuction  - 1 x daily - 3 x weekly - 2 sets - 10 reps  Patient Education - walking program   ASSESSMENT:  CLINICAL IMPRESSION: Continued with LE and functional strength progression with fairly good tolerance. She fatigues quickly with standing activity requiring frequent seated rest break. Requires UE support for standing activity due to weakness/fatigue. Her walking endurance has improved with ability to walk for 2.5 minutes, though gait speed remains slow and she heavily relies upon UE support with RW. We discussed beginning to implement walking program at home starting with short distances frequently throughout the day with handout provided.   Eval: Patient is a 73 y.o. female who was seen today for physical therapy evaluation and treatment for weakness in B LE and chronic B knee and back pain. She was last seen in this clinic in February. She reports some improvements since that time. She is able to get into her car easier and she is having less  popping in her knees. She is working to lose weight in order to get TKR surgery, however, her MD is concerned that she is losing muscle mass. She is limited with ambulation with her RW to 1 min and 20 sec. During that time she was able to walk 98 ft. Her LEFS 29/80. She also demonstrates B LE weakness. She will benefit from skilled PT to address these deficits.    OBJECTIVE IMPAIRMENTS: decreased activity tolerance, decreased mobility, decreased strength, postural dysfunction,  obesity, and pain.   ACTIVITY LIMITATIONS: sitting, standing, stairs, and locomotion level  PARTICIPATION LIMITATIONS: meal prep, cleaning, laundry, shopping, community activity, and church  PERSONAL FACTORS: Age, Fitness, Past/current experiences, Time since onset of injury/illness/exacerbation, and 3+ comorbidities: Diabetes, HTN, TIA, spinal stenosis, polymyalgia rheumatica  are also affecting patient's functional outcome.   REHAB POTENTIAL: Good  CLINICAL DECISION MAKING: Evolving/moderate complexity  EVALUATION COMPLEXITY: Moderate   GOALS: Goals reviewed with patient? Yes  SHORT TERM GOALS: Target date: 12/17/23 Ind with initial HEP Baseline: Goal status: INITIAL  2.  Able to complete 2 min walk test showing improved strength. Baseline: 1 min 20 sec/ 98 ft 12/03/23: 2.5 minutes, 2 laps in clinic  Goal status: MET  LONG TERM GOALS: Target date: 01/14/24  Patient to demonstrate improved gait speed to >=1.6 ft/sec to demo improved community ambulation status Baseline: 1.10 ft/sec 11/21/23 Goal status: INITIAL  2.  Pt able to perform 5XSTS in < = to 16 seconds demonstrating improved strength Baseline: 21 sec Goal status: INITIAL  3.  Patient able to ambulate 300 feet without a sitting break to improve community ambulation. Baseline:  Goal status: INITIAL    PLAN:  PT FREQUENCY: 2x/week  PT DURATION: 8 weeks  PLANNED INTERVENTIONS: 97164- PT Re-evaluation, 97110-Therapeutic exercises, 97530- Therapeutic activity, [id number]- Neuromuscular re-education, 97535- Self Care, 13244- Manual therapy, [phone]- Gait training, [phone]- Aquatic Therapy, Patient/Family education, Balance training, Stair training, Joint mobilization, DME instructions, Cryotherapy, and Moist heat  PLAN FOR NEXT SESSION: work on LE strength, sit to stand, gait endurance.  Delena Casebeer, PT, DPT, ATC 12/03/23 5:00 PM

## 2023-12-05 ENCOUNTER — Ambulatory Visit (INDEPENDENT_AMBULATORY_CARE_PROVIDER_SITE_OTHER): Admitting: Podiatry

## 2023-12-05 ENCOUNTER — Encounter: Payer: Self-pay | Admitting: Podiatry

## 2023-12-05 DIAGNOSIS — Z7901 Long term (current) use of anticoagulants: Secondary | ICD-10-CM | POA: Diagnosis not present

## 2023-12-05 DIAGNOSIS — M79675 Pain in left toe(s): Secondary | ICD-10-CM

## 2023-12-05 DIAGNOSIS — L84 Corns and callosities: Secondary | ICD-10-CM | POA: Diagnosis not present

## 2023-12-05 DIAGNOSIS — M79674 Pain in right toe(s): Secondary | ICD-10-CM

## 2023-12-05 DIAGNOSIS — E1149 Type 2 diabetes mellitus with other diabetic neurological complication: Secondary | ICD-10-CM

## 2023-12-05 DIAGNOSIS — B351 Tinea unguium: Secondary | ICD-10-CM

## 2023-12-05 NOTE — Discharge Instructions (Addendum)
 Post Procedure Spinal Discharge Instruction Sheet  You may resume a regular diet and any medications that you routinely take (including pain medications) unless otherwise noted by MD.  No driving day of procedure.  Light activity throughout the rest of the day.  Do not do any strenuous work, exercise, bending or lifting.  The day following the procedure, you can resume normal physical activity but you should refrain from exercising or physical therapy for at least three days thereafter.  You may apply ice to the injection site, 20 minutes on, 20 minutes off, as needed. Do not apply ice directly to skin.    Common Side Effects:  Headaches- take your usual medications as directed by your physician.  Increase your fluid intake.  Caffeinated beverages may be helpful.  Lie flat in bed until your headache resolves.  Restlessness or inability to sleep- you may have trouble sleeping for the next few days.  Ask your referring physician if you need any medication for sleep.  Facial flushing or redness- should subside within a few days.  Increased pain- a temporary increase in pain a day or two following your procedure is not unusual.  Take your pain medication as prescribed by your referring physician.  Leg cramps  Please contact our office at [phone] for the following symptoms: Fever greater than 100 degrees. Headaches unresolved with medication after 2-3 days. Increased swelling, pain, or redness at injection site.  **MAY RESUME YOUR PLAVIX /ASPIRIN TODAY**  Thank you for visiting [facility] today.

## 2023-12-05 NOTE — Progress Notes (Signed)
 Subjective: Chief Complaint  Patient presents with   [facility]    RM#13 [facility] patient has no other concerns today.    73 y.o. returns the office today for painful, elongated, thickened toenails which they cannot trim herself.  She denies any open sores.  No open lesions.  She has no other concerns today.  She is on Plavix .  PCP: Cydney Draft, MD Last seen 10/11/2023 A1c: 6.8 on 10/11/2023  Objective: AAO 3, NAD DP/PT pulses palpable, CRT less than 3 seconds Protective sensation decreased with Simms Weinstein monofilament Nails hypertrophic, dystrophic, elongated, brittle, discolored 10. There is tenderness overlying the nails 1-5 bilaterally. There is no surrounding erythema or drainage along the nail sites. There is mild hyperkeratotic lesion right foot submetatarsal 5.  No ongoing ulceration, drainage or signs of infection.  Dry skin present particular to the heels. No other areas of discomfort. Decrease in arch upon weightbearing.  No pain with calf compression, swelling, warmth, erythema.  Assessment: Patient presents with symptomatic onychomycosis, hyperkeratotic lesions  Plan: -Treatment options including alternatives, risks, complications were discussed -Nails sharply debrided 10 without complication/bleeding. -Hyperkeratotic lesion sharply debrided x 1 without any complications or bleeding.  Continue with moisturizer daily, offloading. -Discussed daily foot inspection. If there are any changes, to call the office immediately.   Return in about 3 months (around 03/06/2024) for routine care.  Bobbie Burows, DPM

## 2023-12-06 ENCOUNTER — Encounter: Payer: Self-pay | Admitting: Rehabilitative and Restorative Service Providers"

## 2023-12-06 ENCOUNTER — Ambulatory Visit: Admitting: Rehabilitative and Restorative Service Providers"

## 2023-12-06 DIAGNOSIS — M6281 Muscle weakness (generalized): Secondary | ICD-10-CM

## 2023-12-06 DIAGNOSIS — M5459 Other low back pain: Secondary | ICD-10-CM | POA: Diagnosis not present

## 2023-12-06 DIAGNOSIS — M25562 Pain in left knee: Secondary | ICD-10-CM | POA: Diagnosis not present

## 2023-12-06 DIAGNOSIS — R2689 Other abnormalities of gait and mobility: Secondary | ICD-10-CM

## 2023-12-06 DIAGNOSIS — G8929 Other chronic pain: Secondary | ICD-10-CM | POA: Diagnosis not present

## 2023-12-06 DIAGNOSIS — M25561 Pain in right knee: Secondary | ICD-10-CM | POA: Diagnosis not present

## 2023-12-06 NOTE — Therapy (Signed)
 OUTPATIENT PHYSICAL THERAPY LOWER EXTREMITY TREATMENT   Patient Name: Natalie Wright MRN: [id number] DOB:Apr 19, 1951, 73 y.o., female Today's Date: 12/06/2023  END OF SESSION:  PT End of Session - 12/06/23 1110     Visit Number 5    Number of Visits 17    Date for PT Re-Evaluation 01/14/24    Authorization Type medicare and tricare    Progress Note Due on Visit 10    PT Start Time 1107    PT Stop Time 1147    PT Time Calculation (min) 40 min    Activity Tolerance Patient tolerated treatment well    Behavior During Therapy WFL for tasks assessed/performed             Past Medical History:  Diagnosis Date   Allergy    to cats and dogs   Anemia    Arthritis    Cataract    Diabetes mellitus    GERD (gastroesophageal reflux disease)    Glaucoma    Hiatal hernia    and delayed gastric emptying/ sees [facility] for recurrent diarrhea   History of cardiovascular disorder 12/10/2008   Qualifier: Diagnosis of  By: Greer Leak MD, Catherine     Hypertension    OSA (obstructive sleep apnea)    CPAP   Pedal edema    Sleep apnea    Spastic bladder    urology in [place]   TIA (transient ischemic attack)    hx of- sees Dr Chriss Coup (Neurology)   Past Surgical History:  Procedure Laterality Date   CATARACT EXTRACTION  08/2021   IR ANGIOGRAM SELECTIVE EACH ADDITIONAL VESSEL  04/19/2023   IR ANGIOGRAM SELECTIVE EACH ADDITIONAL VESSEL  04/19/2023   IR ANGIOGRAM SELECTIVE EACH ADDITIONAL VESSEL  04/19/2023   IR EMBO ARTERIAL NOT HEMORR HEMANG INC GUIDE ROADMAPPING  04/19/2023   IR EMBO ARTERIAL NOT HEMORR HEMANG INC GUIDE ROADMAPPING  06/07/2023   IR RADIOLOGIST EVAL & MGMT  03/08/2023   IR RADIOLOGIST EVAL & MGMT  05/20/2023   IR RADIOLOGIST EVAL & MGMT  07/19/2023   IR US  GUIDE VASC ACCESS RIGHT  04/19/2023   TUBAL LIGATION     vaginal skin tag removal     Patient Active Problem List   Diagnosis Date Noted   BMI 50.0-59.9, adult (HCC) 06/04/2023   Diabetic retinopathy (HCC)  02/26/2023   Lumbar spondylosis 07/03/2022   Lumbar spinal stenosis 02/26/2022   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/2022   Bilateral hip pain 01/12/2022   Hearing loss due to cerumen impaction, right 01/12/2022   Hammertoe, bilateral 08/23/2021   History of foot ulcer 08/23/2021   Primary osteoarthritis of both knees 12/09/2020   History of burning pain in leg 08/29/2020   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   Tremor 05/05/2020   Pre-ulcerative corn or callous 01/29/2020   History of transient ischemic attack (TIA) 05/15/2018   Family history of ovarian cancer 04/02/2017   Aortic atherosclerosis (HCC) 08/31/2016   Venous stasis 08/10/2016   Palpitations 07/16/2016   Atrial dilatation, left 07/16/2016   Atrial septal aneurysm 07/16/2016   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   Cataract 04/18/2012   Hyperlipidemia 02/13/2011   Pulmonary nodules 09/22/2010   LEG PAIN 09/12/2009   Sleep apnea 04/13/2009   GERD 03/14/2009   Morbid obesity (HCC) 12/10/2008   Essential hypertension, benign 12/10/2008   RAYNAUD'S DISEASE 12/10/2008   Osteoarthritis 12/10/2008   URINARY INCONTINENCE 12/10/2008    PCP: Cydney Draft, MD REFERRING PROVIDER:  Gean Keels,*  REFERRING DIAG: M17.0 (ICD-10-CM) - Primary osteoarthritis of both kneesM47.816 (ICD-10-CM) - Lumbar spondylosisR29.898 (ICD-10-CM) - Weakness of both lower extremities  THERAPY DIAG:  Chronic pain of right knee  Chronic pain of left knee  Other abnormalities of gait and mobility  Muscle weakness (generalized)  Other low back pain  Rationale for Evaluation and Treatment: Rehabilitation  ONSET DATE: lastest episode   SUBJECTIVE:   SUBJECTIVE STATEMENT: Patient is doing 5 min of recumbent bike and trying to incorporate there ex into home routine. She is having a back injection on Monday.  PERTINENT HISTORY: Diabetes, HTN, TIA, spinal stenosis, polymyalgia  rheumatica   PAIN:  Are you having pain? Yes: NPRS scale: 5/10  Pain location: back, knees, hips  Pain description: sore Aggravating factors: Raising B legs hurts the knees, trying to move after standing, pivoting to step  Relieving factors: nothing  PRECAUTIONS: Fall  WEIGHT BEARING RESTRICTIONS: No  FALLS:  Has patient fallen in last 6 months? No  LIVING ENVIRONMENT: Lives with: lives alone spouse passed away 05-12-22 Lives in: House/apartment Stairs: No Has following equipment at home: Quad cane large base and shower bench, grab bars, scooter (uses husband's scooter)  PLOF: declining mobility  PATIENT GOALS: continue to get stronger, more mobility and less comfort  NEXT MD VISIT: 01/14/24  OBJECTIVE:  Note: Objective measures were completed at Evaluation unless otherwise noted.  DIAGNOSTIC FINDINGS: none  PATIENT SURVEYS:  LEFS 29 / 80 = 36.3 %  COGNITION: Overall cognitive status: Within functional limits for tasks assessed     SENSATION: Notes neuropathy with some burning in feet, dec'd sensation  EDEMA:  Bilateral swelling noted in LEs    LOWER EXTREMITY ROM: Active ROM Right eval Left eval  Knee flexion 108 103  Knee extension 0 0   (Blank rows = not tested)  LOWER EXTREMITY MMT: MMT Right eval Left eval  Hip flexion 4+ 4+  Hip extension 5 4+  Hip abduction 4+ 4+  Hip adduction    Hip internal rotation    Hip external rotation    Knee flexion 5 5  Knee extension 5 5  Ankle dorsiflexion 5 5  Ankle plantarflexion    Ankle inversion    Ankle eversion     (Blank rows = not tested)  FUNCTIONAL TESTS:  5 times sit to stand: 21 sec 2 minute walk test: 1 min 20 sec  98 ft 4/10 pain (pain went down from 6/10) Gait speed: 33 ft 29.88 seconds =  1.10 ft/sec  GAIT: Distance walked: 98 Assistive device utilized: Environmental consultant - 2 wheeled Level of assistance: Modified independence Comments: forward trunk lean  [facility] Adult PT Treatment:                                                 DATE: 12/06/23 Therapeutic Exercise: Seated Overhead press 1# x 12 reps LAQ with ball hold anteriorly for core engagement 1# x 10 reps R and L alternating Marching will ball held overhead to engage core Ankle pumps during active rest break between walking laps Neuromuscular re-ed: Wall bumps with overhead reaching x 6 reps working on balance and weight shifting Therapeutic Activity: Sit<>stand x 10 reps Sit>stand attempting overhead reach with L trunk lean Countertop alternating UE reaching R and L hands to upper level cabinet for overhead reach + trunk lengthening  Gait: With RW x 80 ft   x 4 reps (1 rep with 1# weight and then no weight x 3 reps)   [facility] Adult PT Treatment:                                                DATE: 12/03/23 Therapeutic Exercise: LAQ 2 x 10 @ 1 lb Resisted HS curl green band 2 x 10  HEP review  Therapeutic Activity: Sit to stand x 5; 2 x 5 @ 5 lbs  Step taps 6 inch 2 x 10; with RW for support  Walking 2.5 minutes> 2 laps in clinic with RW  Self Care: Walking program to incorporate at home with handout provided                                                                                                                             [facility] Adult PT Treatment:                                                DATE: 11/26/23 Therapeutic Exercise: Seated marching x 20 ea Seated clam BTB x 20 ea Butt kickers x 20 ea  SLR 2 x 5 B S/L hip ABD and S/L ext 1 x 10 each B Therapeutic Activity: Sit to stands x 10 4" step up/down x 10 B Side stepping along stair railing Step out laterally to dot to avoid twisting B x 10 Bridge 2 x 10 separated by other exercises   [facility] Adult PT Treatment:                                                DATE: 11/21/23 Therapeutic Exercise: Seated marching x 20 ea Butt kickers x 20 ea  Standing hip ext (toe tap back) x10 (B) SLR 2 x 5 B S/L hip ABD 2 x 10 B Neuromuscular re-ed: Supine TA with GTB  pull downs (PT holding band as anchor) for core strength 2x10 then 1x10 with alt march + pull down Therapeutic Activity: Gait speed  assessed 1.10 ft/sec Sit to stands x 5, then with 4# weights x 5 4" step up/down 1x5 (changing leading leg each), then 1x 3 Side stepping along stair railing Bridge 2 x 10  PATIENT EDUCATION:  Education details: HEP, walking program reviewed, and use of recumbent bike. Postural education to reduce leaning onto the walker. Person educated: Patient Education method: Explanation, Demonstration, and Handouts Education comprehension: verbalized understanding and returned demonstration  HOME EXERCISE PROGRAM: Access Code: [email or web address] URL: [email or web address] Date: 12/03/2023 Prepared by:  Samantha Pexa  Exercises - Supine Active Straight Leg Raise  - 1 x daily - 7 x weekly - 1 sets - 10 reps - Supine Bridge  - 2 x daily - 4 x weekly - 1 sets - 10 reps - Heel Toe Raises with Counter Support  - 2 x daily - 7 x weekly - 1 sets - 10 reps - Seated March  - 2 x daily - 7 x weekly - 1 sets - 10 reps - Sidelying hip Flexion and ABDuction  - 1 x daily - 3 x weekly - 2 sets - 10 reps  Patient Education - walking program   ASSESSMENT:  CLINICAL IMPRESSION: The patient tolerated continued progressive strengthening today with emphasis on trunk elongation for core engagement, standing tolerance, and LE strength. PT to continue to progress to STGs/LTGs working on functional strength and mobility.  Eval: Patient is a 73 y.o. female who was seen today for physical therapy evaluation and treatment for weakness in B LE and chronic B knee and back pain. She was last seen in this clinic in February. She reports some improvements since that time. She is able to get into her car easier and she is having less popping in her knees. She is working to lose weight in order to get TKR surgery, however, her MD is concerned that she is losing muscle mass. She is limited with  ambulation with her RW to 1 min and 20 sec. During that time she was able to walk 98 ft. Her LEFS 29/80. She also demonstrates B LE weakness. She will benefit from skilled PT to address these deficits.    OBJECTIVE IMPAIRMENTS: decreased activity tolerance, decreased mobility, decreased strength, postural dysfunction, obesity, and pain.   GOALS: Goals reviewed with patient? Yes SHORT TERM GOALS: Target date: 12/17/23 Ind with initial HEP Baseline: Goal status: INITIAL  2.  Able to complete 2 min walk test showing improved strength. Baseline: 1 min 20 sec/ 98 ft 12/03/23: 2.5 minutes, 2 laps in clinic  Goal status: MET  LONG TERM GOALS: Target date: 01/14/24  Patient to demonstrate improved gait speed to >=1.6 ft/sec to demo improved community ambulation status Baseline: 1.10 ft/sec 11/21/23 Goal status: INITIAL  2.  Pt able to perform 5XSTS in < = to 16 seconds demonstrating improved strength Baseline: 21 sec Goal status: INITIAL  3.  Patient able to ambulate 300 feet without a sitting break to improve community ambulation. Baseline:  Goal status: INITIAL  PLAN:  PT FREQUENCY: 2x/week  PT DURATION: 8 weeks  PLANNED INTERVENTIONS: 97164- PT Re-evaluation, 97110-Therapeutic exercises, 97530- Therapeutic activity, [id number]- Neuromuscular re-education, 97535- Self Care, 16109- Manual therapy, [phone]- Gait training, [phone]- Aquatic Therapy, Patient/Family education, Balance training, Stair training, Joint mobilization, DME instructions, Cryotherapy, and Moist heat  PLAN FOR NEXT SESSION: work on LE strength, sit to stand, gait endurance.  Sherisse Fullilove, PT 12/06/23 12:41 PM

## 2023-12-09 ENCOUNTER — Ambulatory Visit
Admission: RE | Admit: 2023-12-09 | Discharge: 2023-12-09 | Disposition: A | Discharge status: Home / Self Care | Source: Ambulatory Visit | Attending: Sports Medicine | Admitting: Sports Medicine

## 2023-12-09 DIAGNOSIS — M48061 Spinal stenosis, lumbar region without neurogenic claudication: Secondary | ICD-10-CM

## 2023-12-09 DIAGNOSIS — M4727 Other spondylosis with radiculopathy, lumbosacral region: Secondary | ICD-10-CM | POA: Diagnosis not present

## 2023-12-09 MED ORDER — IOPAMIDOL (ISOVUE-M 200) INJECTION 41%
1.0000 mL | Freq: Once | INTRAMUSCULAR | Status: AC
  Administered 2023-12-09: 1 mL via EPIDURAL

## 2023-12-09 MED ORDER — METHYLPREDNISOLONE ACETATE 40 MG/ML INJ SUSP (RADIOLOG
80.0000 mg | Freq: Once | INTRAMUSCULAR | Status: AC
Start: 2023-12-09 — End: 2023-12-09
  Administered 2023-12-09: 80 mg via EPIDURAL

## 2023-12-12 ENCOUNTER — Ambulatory Visit: Admitting: Rehabilitative and Restorative Service Providers"

## 2023-12-12 ENCOUNTER — Encounter: Payer: Self-pay | Admitting: Rehabilitative and Restorative Service Providers"

## 2023-12-12 DIAGNOSIS — M5459 Other low back pain: Secondary | ICD-10-CM

## 2023-12-12 DIAGNOSIS — M25561 Pain in right knee: Secondary | ICD-10-CM | POA: Diagnosis not present

## 2023-12-12 DIAGNOSIS — G8929 Other chronic pain: Secondary | ICD-10-CM | POA: Diagnosis not present

## 2023-12-12 DIAGNOSIS — M6281 Muscle weakness (generalized): Secondary | ICD-10-CM | POA: Diagnosis not present

## 2023-12-12 DIAGNOSIS — R2689 Other abnormalities of gait and mobility: Secondary | ICD-10-CM

## 2023-12-12 DIAGNOSIS — M25562 Pain in left knee: Secondary | ICD-10-CM | POA: Diagnosis not present

## 2023-12-12 NOTE — Therapy (Signed)
 OUTPATIENT PHYSICAL THERAPY LOWER EXTREMITY TREATMENT   Patient Name: Natalie Wright MRN: [id number] DOB:[birth year], 73 y.o., female Today's Date: 12/12/2023  END OF SESSION:  PT End of Session - 12/12/23 1547     Visit Number 6    Number of Visits 17    Date for PT Re-Evaluation 01/14/24    Authorization Type medicare and tricare    Authorization - Visit Number 33    Progress Note Due on Visit 10    PT Start Time 1547   patient 17 min late for appt   PT Stop Time 1615    PT Time Calculation (min) 28 min    Activity Tolerance Patient tolerated treatment well          Past Medical History:  Diagnosis Date   Allergy    to cats and dogs   Anemia    Arthritis    Cataract    Diabetes mellitus    GERD (gastroesophageal reflux disease)    Glaucoma    Hiatal hernia    and delayed gastric emptying/ sees [facility] for recurrent diarrhea   History of cardiovascular disorder 12/10/2008   Qualifier: Diagnosis of  By: Greer Leak MD, Catherine     Hypertension    OSA (obstructive sleep apnea)    CPAP   Pedal edema    Sleep apnea    Spastic bladder    urology in [place]   TIA (transient ischemic attack)    hx of- sees Dr Chriss Coup (Neurology)   Past Surgical History:  Procedure Laterality Date   CATARACT EXTRACTION  08/2021   IR ANGIOGRAM SELECTIVE EACH ADDITIONAL VESSEL  04/19/2023   IR ANGIOGRAM SELECTIVE EACH ADDITIONAL VESSEL  04/19/2023   IR ANGIOGRAM SELECTIVE EACH ADDITIONAL VESSEL  04/19/2023   IR EMBO ARTERIAL NOT HEMORR HEMANG INC GUIDE ROADMAPPING  04/19/2023   IR EMBO ARTERIAL NOT HEMORR HEMANG INC GUIDE ROADMAPPING  06/07/2023   IR RADIOLOGIST EVAL & MGMT  03/08/2023   IR RADIOLOGIST EVAL & MGMT  05/20/2023   IR RADIOLOGIST EVAL & MGMT  07/19/2023   IR US  GUIDE VASC ACCESS RIGHT  04/19/2023   TUBAL LIGATION     vaginal skin tag removal     Patient Active Problem List   Diagnosis Date Noted   BMI 50.0-59.9, adult (HCC) 06/04/2023   Diabetic retinopathy (HCC)  02/26/2023   Lumbar spondylosis 07/03/2022   Lumbar spinal stenosis 02/26/2022   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/2022   Bilateral hip pain 01/12/2022   Hearing loss due to cerumen impaction, right 01/12/2022   Hammertoe, bilateral 08/23/2021   History of foot ulcer 08/23/2021   Primary osteoarthritis of both knees 12/09/2020   History of burning pain in leg 08/29/2020   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   Tremor 05/05/2020   Pre-ulcerative corn or callous 01/29/2020   History of transient ischemic attack (TIA) 05/15/2018   Family history of ovarian cancer 04/02/2017   Aortic atherosclerosis (HCC) 08/31/2016   Venous stasis 08/10/2016   Palpitations 07/16/2016   Atrial dilatation, left 07/16/2016   Atrial septal aneurysm 07/16/2016   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   Cataract 04/18/2012   Hyperlipidemia 02/13/2011   Pulmonary nodules 09/22/2010   LEG PAIN 09/12/2009   Sleep apnea 04/13/2009   GERD 03/14/2009   Morbid obesity (HCC) 12/10/2008   Essential hypertension, benign 12/10/2008   RAYNAUD'S DISEASE 12/10/2008   Osteoarthritis 12/10/2008   URINARY INCONTINENCE 12/10/2008    PCP: Cydney Draft, MD  REFERRING PROVIDER: Gean Keels,*  REFERRING DIAG: M17.0 (ICD-10-CM) - Primary osteoarthritis of both kneesM47.816 (ICD-10-CM) - Lumbar spondylosisR29.898 (ICD-10-CM) - Weakness of both lower extremities  THERAPY DIAG:  Chronic pain of right knee  Chronic pain of left knee  Other abnormalities of gait and mobility  Muscle weakness (generalized)  Other low back pain  Rationale for Evaluation and Treatment: Rehabilitation  ONSET DATE: lastest episode   SUBJECTIVE:   SUBJECTIVE STATEMENT: Patient is not having a great day. Went to the store and walked much more than usual. Tired this afternoon. Patient reports that she has the shot in her back Monday. She had pain with the injection more than usual.  Feels the shot did help. She has the shots about every 6 months and they help. She still takes pain pills for the knee pain. Has difficulty lying in the bed with pain in the side of both hips. Has to use pain pills at night so she can go to sleep. She feels she is tolerating more activity in the house.    PERTINENT HISTORY: Diabetes, HTN, TIA, spinal stenosis, polymyalgia rheumatica   PAIN:  Are you having pain? Yes: NPRS scale: 5/10  Pain location: back, knees, hips  Pain description: sore Aggravating factors: Raising B legs hurts the knees, trying to move after standing, pivoting to step  Relieving factors: nothing  PRECAUTIONS: Fall  WEIGHT BEARING RESTRICTIONS: No  FALLS:  Has patient fallen in last 6 months? No  LIVING ENVIRONMENT: Lives with: lives alone spouse passed away 2022-05-17 Lives in: House/apartment Stairs: No Has following equipment at home: Quad cane large base and shower bench, grab bars, scooter (uses husband's scooter)  PLOF: declining mobility  PATIENT GOALS: continue to get stronger, more mobility and less comfort  NEXT MD VISIT: 01/14/24  OBJECTIVE:  Note: Objective measures were completed at Evaluation unless otherwise noted.  DIAGNOSTIC FINDINGS: none  PATIENT SURVEYS:  LEFS 29 / 80 = 36.3 %  COGNITION: Overall cognitive status: Within functional limits for tasks assessed     SENSATION: Notes neuropathy with some burning in feet, dec'd sensation  EDEMA:  Bilateral swelling noted in LEs    LOWER EXTREMITY ROM: Active ROM Right eval Left eval  Knee flexion 108 103  Knee extension 0 0   (Blank rows = not tested)  LOWER EXTREMITY MMT: MMT Right eval Left eval  Hip flexion 4+ 4+  Hip extension 5 4+  Hip abduction 4+ 4+  Hip adduction    Hip internal rotation    Hip external rotation    Knee flexion 5 5  Knee extension 5 5  Ankle dorsiflexion 5 5  Ankle plantarflexion    Ankle inversion    Ankle eversion     (Blank rows = not  tested)  FUNCTIONAL TESTS:  5 times sit to stand: 21 sec 2 minute walk test: 1 min 20 sec  98 ft 4/10 pain (pain went down from 6/10) Gait speed: 33 ft 29.88 seconds =  1.10 ft/sec  GAIT: Distance walked: 98 Assistive device utilized: Walker - 2 wheeled Level of assistance: Modified independence Comments: forward trunk lean  [facility] Adult PT Treatment:                                                DATE: 12/12/23 Therapeutic Exercise: Seated Overhead press 1# x 12 reps  LAQ with ball hold anteriorly for core engagement 1# x 10 reps R and L alternating Marching will ball held overhead to engage core x 10  Ankle pumps during active rest break between walking laps Therapeutic Activity: Sit<>stand x 10 reps Ball between hands chest level push out, pull in x 10  Ball btn hands wood chopping x 10 L/R  Shoulder ER in sitting red TB x 10  W red TB x 10  Sit>stand attempting reaching overhead walking UE's up along the overhead cabinet  Side steps along countertop R and L ~ 10 feet x 4     [facility] Adult PT Treatment:                                                DATE: 12/06/23 Therapeutic Exercise: Seated Overhead press 1# x 12 reps LAQ with ball hold anteriorly for core engagement 1# x 10 reps R and L alternating Marching will ball held overhead to engage core Ankle pumps during active rest break between walking laps Neuromuscular re-ed: Wall bumps with overhead reaching x 6 reps working on balance and weight shifting Therapeutic Activity: Sit<>stand x 10 reps Sit>stand attempting overhead reach with L trunk lean Countertop alternating UE reaching R and L hands to upper level cabinet for overhead reach + trunk lengthening Gait: With RW x 80 ft   x 4 reps (1 rep with 1# weight and then no weight x 3 reps)   [facility] Adult PT Treatment:                                                DATE: 12/03/23 Therapeutic Exercise: LAQ 2 x 10 @ 1 lb Resisted HS curl green band 2 x 10  HEP review   Therapeutic Activity: Sit to stand x 5; 2 x 5 @ 5 lbs  Step taps 6 inch 2 x 10; with RW for support  Walking 2.5 minutes> 2 laps in clinic with RW  Self Care: Walking program to incorporate at home with handout provided                                                                                                                             [facility] Adult PT Treatment:                                                DATE: 11/26/23 Therapeutic Exercise: Seated marching x 20 ea Seated clam BTB x 20 ea Butt kickers x 20 ea  SLR 2 x 5 B S/L hip ABD  and S/L ext 1 x 10 each B Therapeutic Activity: Sit to stands x 10 4 step up/down x 10 B Side stepping along stair railing Step out laterally to dot to avoid twisting B x 10 Bridge 2 x 10 separated by other exercises   [facility] Adult PT Treatment:                                                DATE: 11/21/23 Therapeutic Exercise: Seated marching x 20 ea Butt kickers x 20 ea  Standing hip ext (toe tap back) x10 (B) SLR 2 x 5 B S/L hip ABD 2 x 10 B Neuromuscular re-ed: Supine TA with GTB pull downs (PT holding band as anchor) for core strength 2x10 then 1x10 with alt march + pull down Therapeutic Activity: Gait speed  assessed 1.10 ft/sec Sit to stands x 5, then with 4# weights x 5 4 step up/down 1x5 (changing leading leg each), then 1x 3 Side stepping along stair railing Bridge 2 x 10  PATIENT EDUCATION:  Education details: HEP, walking program reviewed, and use of recumbent bike. Postural education to reduce leaning onto the walker. Person educated: Patient Education method: Explanation, Demonstration, and Handouts Education comprehension: verbalized understanding and returned demonstration  HOME EXERCISE PROGRAM: Access Code: [email or web address] URL: [email or web address] Date: 12/03/2023 Prepared by: Forrestine Ike  Exercises - Supine Active Straight Leg Raise  - 1 x daily - 7 x weekly - 1 sets - 10 reps - Supine Bridge  - 2 x  daily - 4 x weekly - 1 sets - 10 reps - Heel Toe Raises with Counter Support  - 2 x daily - 7 x weekly - 1 sets - 10 reps - Seated March  - 2 x daily - 7 x weekly - 1 sets - 10 reps - Sidelying hip Flexion and ABDuction  - 1 x daily - 3 x weekly - 2 sets - 10 reps  Patient Education - walking program   ASSESSMENT:  CLINICAL IMPRESSION: Patient arrived late for appointment. She was fatigued from activities earlier in the day. Working on UE and LE exercises and activities to improve core stability. Continued with standing exercises to work on upright posture and alignment as well as strength.    Eval: Patient is a 73 y.o. female who was seen today for physical therapy evaluation and treatment for weakness in B LE and chronic B knee and back pain. She was last seen in this clinic in February. She reports some improvements since that time. She is able to get into her car easier and she is having less popping in her knees. She is working to lose weight in order to get TKR surgery, however, her MD is concerned that she is losing muscle mass. She is limited with ambulation with her RW to 1 min and 20 sec. During that time she was able to walk 98 ft. Her LEFS 29/80. She also demonstrates B LE weakness. She will benefit from skilled PT to address these deficits.    OBJECTIVE IMPAIRMENTS: decreased activity tolerance, decreased mobility, decreased strength, postural dysfunction, obesity, and pain.   GOALS: Goals reviewed with patient? Yes SHORT TERM GOALS: Target date: 12/17/23 Ind with initial HEP Baseline: Goal status: INITIAL  2.  Able to complete 2 min walk test showing improved strength. Baseline: 1 min 20 sec/ 98 ft 12/03/23: 2.5  minutes, 2 laps in clinic  Goal status: MET  LONG TERM GOALS: Target date: 01/14/24  Patient to demonstrate improved gait speed to >=1.6 ft/sec to demo improved community ambulation status Baseline: 1.10 ft/sec 11/21/23 Goal status: INITIAL  2.  Pt able to perform  5XSTS in < = to 16 seconds demonstrating improved strength Baseline: 21 sec Goal status: INITIAL  3.  Patient able to ambulate 300 feet without a sitting break to improve community ambulation. Baseline:  Goal status: INITIAL  PLAN:  PT FREQUENCY: 2x/week  PT DURATION: 8 weeks  PLANNED INTERVENTIONS: 97164- PT Re-evaluation, 97110-Therapeutic exercises, 97530- Therapeutic activity, [id number]- Neuromuscular re-education, 97535- Self Care, 16109- Manual therapy, [phone]- Gait training, [phone]- Aquatic Therapy, Patient/Family education, Balance training, Stair training, Joint mobilization, DME instructions, Cryotherapy, and Moist heat  PLAN FOR NEXT SESSION: work on LE strength, sit to stand, gait endurance.  Toniesha Zellner P Clearance [place], PT 12/12/23 3:49 PM

## 2023-12-17 ENCOUNTER — Ambulatory Visit

## 2023-12-17 DIAGNOSIS — M25561 Pain in right knee: Secondary | ICD-10-CM | POA: Diagnosis not present

## 2023-12-17 DIAGNOSIS — M25562 Pain in left knee: Secondary | ICD-10-CM | POA: Diagnosis not present

## 2023-12-17 DIAGNOSIS — M6281 Muscle weakness (generalized): Secondary | ICD-10-CM

## 2023-12-17 DIAGNOSIS — R2689 Other abnormalities of gait and mobility: Secondary | ICD-10-CM

## 2023-12-17 DIAGNOSIS — M5459 Other low back pain: Secondary | ICD-10-CM | POA: Diagnosis not present

## 2023-12-17 DIAGNOSIS — G8929 Other chronic pain: Secondary | ICD-10-CM | POA: Diagnosis not present

## 2023-12-17 NOTE — Therapy (Signed)
 OUTPATIENT PHYSICAL THERAPY LOWER EXTREMITY TREATMENT   Patient Name: Natalie Wright MRN: [id number] DOB:1951/04/26, 73 y.o., female Today's Date: 12/17/2023  END OF SESSION:  PT End of Session - 12/17/23 1013     Visit Number 7    Number of Visits 17    Date for PT Re-Evaluation 01/14/24    Authorization Type medicare and tricare    Progress Note Due on Visit 10    PT Start Time 1015    PT Stop Time 1054    PT Time Calculation (min) 39 min    Activity Tolerance Patient tolerated treatment well    Behavior During Therapy WFL for tasks assessed/performed           Past Medical History:  Diagnosis Date   Allergy    to cats and dogs   Anemia    Arthritis    Cataract    Diabetes mellitus    GERD (gastroesophageal reflux disease)    Glaucoma    Hiatal hernia    and delayed gastric emptying/ sees [facility] for recurrent diarrhea   History of cardiovascular disorder 12/10/2008   Qualifier: Diagnosis of  By: Greer Leak MD, Catherine     Hypertension    OSA (obstructive sleep apnea)    CPAP   Pedal edema    Sleep apnea    Spastic bladder    urology in [place]   TIA (transient ischemic attack)    hx of- sees Dr Chriss Coup (Neurology)   Past Surgical History:  Procedure Laterality Date   CATARACT EXTRACTION  08/2021   IR ANGIOGRAM SELECTIVE EACH ADDITIONAL VESSEL  04/19/2023   IR ANGIOGRAM SELECTIVE EACH ADDITIONAL VESSEL  04/19/2023   IR ANGIOGRAM SELECTIVE EACH ADDITIONAL VESSEL  04/19/2023   IR EMBO ARTERIAL NOT HEMORR HEMANG INC GUIDE ROADMAPPING  04/19/2023   IR EMBO ARTERIAL NOT HEMORR HEMANG INC GUIDE ROADMAPPING  06/07/2023   IR RADIOLOGIST EVAL & MGMT  03/08/2023   IR RADIOLOGIST EVAL & MGMT  05/20/2023   IR RADIOLOGIST EVAL & MGMT  07/19/2023   IR US  GUIDE VASC ACCESS RIGHT  04/19/2023   TUBAL LIGATION     vaginal skin tag removal     Patient Active Problem List   Diagnosis Date Noted   BMI 50.0-59.9, adult (HCC) 06/04/2023   Diabetic retinopathy (HCC)  02/26/2023   Lumbar spondylosis 07/03/2022   Lumbar spinal stenosis 02/26/2022   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/2022   Bilateral hip pain 01/12/2022   Hearing loss due to cerumen impaction, right 01/12/2022   Hammertoe, bilateral 08/23/2021   History of foot ulcer 08/23/2021   Primary osteoarthritis of both knees 12/09/2020   History of burning pain in leg 08/29/2020   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   Tremor 05/05/2020   Pre-ulcerative corn or callous 01/29/2020   History of transient ischemic attack (TIA) 05/15/2018   Family history of ovarian cancer 04/02/2017   Aortic atherosclerosis (HCC) 08/31/2016   Venous stasis 08/10/2016   Palpitations 07/16/2016   Atrial dilatation, left 07/16/2016   Atrial septal aneurysm 07/16/2016   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   Cataract 04/18/2012   Hyperlipidemia 02/13/2011   Pulmonary nodules 09/22/2010   LEG PAIN 09/12/2009   Sleep apnea 04/13/2009   GERD 03/14/2009   Morbid obesity (HCC) 12/10/2008   Essential hypertension, benign 12/10/2008   RAYNAUD'S DISEASE 12/10/2008   Osteoarthritis 12/10/2008   URINARY INCONTINENCE 12/10/2008    PCP: Cydney Draft, MD REFERRING PROVIDER: Annemarie Kil  J,*  REFERRING DIAG: M17.0 (ICD-10-CM) - Primary osteoarthritis of both kneesM47.816 (ICD-10-CM) - Lumbar spondylosisR29.898 (ICD-10-CM) - Weakness of both lower extremities  THERAPY DIAG:  Chronic pain of right knee  Chronic pain of left knee  Other abnormalities of gait and mobility  Muscle weakness (generalized)  Other low back pain  Rationale for Evaluation and Treatment: Rehabilitation  ONSET DATE: lastest episode   SUBJECTIVE:   SUBJECTIVE STATEMENT: Patient has plans to go to [employer] today and walk using the cart. Has been out the past 4 days shopping/running errands and has been able to walk into these stores. She can't walk the whole store, but  is able to get a few items. The back injection has been helpful.    PERTINENT HISTORY: Diabetes, HTN, TIA, spinal stenosis, polymyalgia rheumatica   PAIN:  Are you having pain? Yes: NPRS scale: 5/10  Pain location: bilateral knees  Pain description: sore Aggravating factors: Raising B legs hurts the knees, trying to move after standing, pivoting to step  Relieving factors: nothing  PRECAUTIONS: Fall  WEIGHT BEARING RESTRICTIONS: No  FALLS:  Has patient fallen in last 6 months? No  LIVING ENVIRONMENT: Lives with: lives alone spouse passed away 2022/06/13 Lives in: House/apartment Stairs: No Has following equipment at home: Quad cane large base and shower bench, grab bars, scooter (uses husband's scooter)  PLOF: declining mobility  PATIENT GOALS: continue to get stronger, more mobility and less comfort  NEXT MD VISIT: 01/14/24  OBJECTIVE:  Note: Objective measures were completed at Evaluation unless otherwise noted.  DIAGNOSTIC FINDINGS: none  PATIENT SURVEYS:  LEFS 29 / 80 = 36.3 %  COGNITION: Overall cognitive status: Within functional limits for tasks assessed     SENSATION: Notes neuropathy with some burning in feet, dec'd sensation  EDEMA:  Bilateral swelling noted in LEs    LOWER EXTREMITY ROM: Active ROM Right eval Left eval  Knee flexion 108 103  Knee extension 0 0   (Blank rows = not tested)  LOWER EXTREMITY MMT: MMT Right eval Left eval  Hip flexion 4+ 4+  Hip extension 5 4+  Hip abduction 4+ 4+  Hip adduction    Hip internal rotation    Hip external rotation    Knee flexion 5 5  Knee extension 5 5  Ankle dorsiflexion 5 5  Ankle plantarflexion    Ankle inversion    Ankle eversion     (Blank rows = not tested)  FUNCTIONAL TESTS:  5 times sit to stand: 21 sec 2 minute walk test: 1 min 20 sec  98 ft 4/10 pain (pain went down from 6/10) Gait speed: 33 ft 29.88 seconds =  1.10 ft/sec  GAIT: Distance walked: 98 Assistive device  utilized: Environmental consultant - 2 wheeled Level of assistance: Modified independence Comments: forward trunk lean [facility] Adult PT Treatment:                                                DATE: 12/17/23 Therapeutic Exercise: LAQ 2 x 10 @ 1.5 lbs  Standing calf raise 2 x 10; counter support  Standing hip abduction 2 x 10; counter support   Therapeutic Activity: Sit to stand with bicep curl to overhead 1# x 10  Standing march BUE support 2 x 10   [facility] Adult PT Treatment:  DATE: 12/12/23 Therapeutic Exercise: Seated Overhead press 1# x 12 reps LAQ with ball hold anteriorly for core engagement 1# x 10 reps R and L alternating Marching will ball held overhead to engage core x 10  Ankle pumps during active rest break between walking laps Therapeutic Activity: Sit<>stand x 10 reps Ball between hands chest level push out, pull in x 10  Ball btn hands wood chopping x 10 L/R  Shoulder ER in sitting red TB x 10  W red TB x 10  Sit>stand attempting reaching overhead walking UE's up along the overhead cabinet  Side steps along countertop R and L ~ 10 feet x 4     [facility] Adult PT Treatment:                                                DATE: 12/06/23 Therapeutic Exercise: Seated Overhead press 1# x 12 reps LAQ with ball hold anteriorly for core engagement 1# x 10 reps R and L alternating Marching will ball held overhead to engage core Ankle pumps during active rest break between walking laps Neuromuscular re-ed: Wall bumps with overhead reaching x 6 reps working on balance and weight shifting Therapeutic Activity: Sit<>stand x 10 reps Sit>stand attempting overhead reach with L trunk lean Countertop alternating UE reaching R and L hands to upper level cabinet for overhead reach + trunk lengthening Gait: With RW x 80 ft   x 4 reps (1 rep with 1# weight and then no weight x 3 reps)   [facility] Adult PT Treatment:                                                DATE:  12/03/23 Therapeutic Exercise: LAQ 2 x 10 @ 1 lb Resisted HS curl green band 2 x 10  HEP review  Therapeutic Activity: Sit to stand x 5; 2 x 5 @ 5 lbs  Step taps 6 inch 2 x 10; with RW for support  Walking 2.5 minutes> 2 laps in clinic with RW  Self Care: Walking program to incorporate at home with handout provided                                                                                                                             [facility] Adult PT Treatment:                                                DATE: 11/26/23 Therapeutic Exercise: Seated marching x 20 ea Seated clam BTB x 20 ea Butt kickers x  20 ea  SLR 2 x 5 B S/L hip ABD and S/L ext 1 x 10 each B Therapeutic Activity: Sit to stands x 10 4 step up/down x 10 B Side stepping along stair railing Step out laterally to dot to avoid twisting B x 10 Bridge 2 x 10 separated by other exercises   PATIENT EDUCATION:  Education details: HEP review Person educated: Patient Education method: Explanation Education comprehension: verbalized understanding  HOME EXERCISE PROGRAM: Access Code: [email or web address] URL: [email or web address] Date: 12/03/2023 Prepared by: Forrestine Ike  Exercises - Supine Active Straight Leg Raise  - 1 x daily - 7 x weekly - 1 sets - 10 reps - Supine Bridge  - 2 x daily - 4 x weekly - 1 sets - 10 reps - Heel Toe Raises with Counter Support  - 2 x daily - 7 x weekly - 1 sets - 10 reps - Seated March  - 2 x daily - 7 x weekly - 1 sets - 10 reps - Sidelying hip Flexion and ABDuction  - 1 x daily - 3 x weekly - 2 sets - 10 reps  Patient Education - walking program   ASSESSMENT:  CLINICAL IMPRESSION: Patient with fairly good tolerance to today's session focusing on standing and LE strengthening. Continues to fatigue quickly with standing activity requiring frequent seated rest breaks. She has tendency to forward flex and place forearms on counter for support during standing activity requiring  consistent cues to maintain upright posture and reduce arm support with ability to correct with cues. She reports initial knee stiffness with standing activity that improves with continued reps.    Eval: Patient is a 73 y.o. female who was seen today for physical therapy evaluation and treatment for weakness in B LE and chronic B knee and back pain. She was last seen in this clinic in February. She reports some improvements since that time. She is able to get into her car easier and she is having less popping in her knees. She is working to lose weight in order to get TKR surgery, however, her MD is concerned that she is losing muscle mass. She is limited with ambulation with her RW to 1 min and 20 sec. During that time she was able to walk 98 ft. Her LEFS 29/80. She also demonstrates B LE weakness. She will benefit from skilled PT to address these deficits.    OBJECTIVE IMPAIRMENTS: decreased activity tolerance, decreased mobility, decreased strength, postural dysfunction, obesity, and pain.   GOALS: Goals reviewed with patient? Yes SHORT TERM GOALS: Target date: 12/17/23 Ind with initial HEP Baseline: Goal status: MET  2.  Able to complete 2 min walk test showing improved strength. Baseline: 1 min 20 sec/ 98 ft 12/03/23: 2.5 minutes, 2 laps in clinic  Goal status: MET  LONG TERM GOALS: Target date: 01/14/24  Patient to demonstrate improved gait speed to >=1.6 ft/sec to demo improved community ambulation status Baseline: 1.10 ft/sec 11/21/23 Goal status: INITIAL  2.  Pt able to perform 5XSTS in < = to 16 seconds demonstrating improved strength Baseline: 21 sec Goal status: INITIAL  3.  Patient able to ambulate 300 feet without a sitting break to improve community ambulation. Baseline:  Goal status: INITIAL  PLAN:  PT FREQUENCY: 2x/week  PT DURATION: 8 weeks  PLANNED INTERVENTIONS: 97164- PT Re-evaluation, 97110-Therapeutic exercises, 97530- Therapeutic activity, [id number]-  Neuromuscular re-education, 97535- Self Care, 30160- Manual therapy, [phone]- Gait training, [phone]- Aquatic Therapy, Patient/Family education, Balance training, Stair training, Joint mobilization,  DME instructions, Cryotherapy, and Moist heat  PLAN FOR NEXT SESSION: work on LE strength, sit to stand, gait endurance.  Kess Mcilwain, PT, DPT, ATC 12/17/23 10:54 AM

## 2023-12-19 ENCOUNTER — Ambulatory Visit

## 2023-12-19 DIAGNOSIS — M5459 Other low back pain: Secondary | ICD-10-CM | POA: Diagnosis not present

## 2023-12-19 DIAGNOSIS — M25561 Pain in right knee: Secondary | ICD-10-CM | POA: Diagnosis not present

## 2023-12-19 DIAGNOSIS — G8929 Other chronic pain: Secondary | ICD-10-CM

## 2023-12-19 DIAGNOSIS — M25562 Pain in left knee: Secondary | ICD-10-CM | POA: Diagnosis not present

## 2023-12-19 DIAGNOSIS — R2689 Other abnormalities of gait and mobility: Secondary | ICD-10-CM

## 2023-12-19 DIAGNOSIS — M6281 Muscle weakness (generalized): Secondary | ICD-10-CM

## 2023-12-19 NOTE — Therapy (Signed)
 OUTPATIENT PHYSICAL THERAPY LOWER EXTREMITY TREATMENT   Patient Name: Natalie Wright MRN: [id number] DOB:03-07-1951, 73 y.o., female Today's Date: 12/19/2023  END OF SESSION:  PT End of Session - 12/19/23 1035     Visit Number 8    Number of Visits 17    Date for PT Re-Evaluation 01/14/24    Authorization Type medicare and tricare    Progress Note Due on Visit 10    PT Start Time 1023    PT Stop Time 1101    PT Time Calculation (min) 38 min          Past Medical History:  Diagnosis Date   Allergy    to cats and dogs   Anemia    Arthritis    Cataract    Diabetes mellitus    GERD (gastroesophageal reflux disease)    Glaucoma    Hiatal hernia    and delayed gastric emptying/ sees [facility] for recurrent diarrhea   History of cardiovascular disorder 12/10/2008   Qualifier: Diagnosis of  By: Greer Leak MD, Catherine     Hypertension    OSA (obstructive sleep apnea)    CPAP   Pedal edema    Sleep apnea    Spastic bladder    urology in [place]   TIA (transient ischemic attack)    hx of- sees Dr Chriss Coup (Neurology)   Past Surgical History:  Procedure Laterality Date   CATARACT EXTRACTION  08/2021   IR ANGIOGRAM SELECTIVE EACH ADDITIONAL VESSEL  04/19/2023   IR ANGIOGRAM SELECTIVE EACH ADDITIONAL VESSEL  04/19/2023   IR ANGIOGRAM SELECTIVE EACH ADDITIONAL VESSEL  04/19/2023   IR EMBO ARTERIAL NOT HEMORR HEMANG INC GUIDE ROADMAPPING  04/19/2023   IR EMBO ARTERIAL NOT HEMORR HEMANG INC GUIDE ROADMAPPING  06/07/2023   IR RADIOLOGIST EVAL & MGMT  03/08/2023   IR RADIOLOGIST EVAL & MGMT  05/20/2023   IR RADIOLOGIST EVAL & MGMT  07/19/2023   IR US  GUIDE VASC ACCESS RIGHT  04/19/2023   TUBAL LIGATION     vaginal skin tag removal     Patient Active Problem List   Diagnosis Date Noted   BMI 50.0-59.9, adult (HCC) 06/04/2023   Diabetic retinopathy (HCC) 02/26/2023   Lumbar spondylosis 07/03/2022   Lumbar spinal stenosis 02/26/2022   Erythema migrans (Lyme disease)  01/12/2022   Bilateral hip pain 01/12/2022   Hearing loss due to cerumen impaction, right 01/12/2022   Hammertoe, bilateral 08/23/2021   History of foot ulcer 08/23/2021   Primary osteoarthritis of both knees 12/09/2020   History of burning pain in leg 08/29/2020   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   Tremor 05/05/2020   Pre-ulcerative corn or callous 01/29/2020   History of transient ischemic attack (TIA) 05/15/2018   Family history of ovarian cancer 04/02/2017   Aortic atherosclerosis (HCC) 08/31/2016   Venous stasis 08/10/2016   Palpitations 07/16/2016   Atrial dilatation, left 07/16/2016   Atrial septal aneurysm 07/16/2016   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   Cataract 04/18/2012   Hyperlipidemia 02/13/2011   Pulmonary nodules 09/22/2010   LEG PAIN 09/12/2009   Sleep apnea 04/13/2009   GERD 03/14/2009   Morbid obesity (HCC) 12/10/2008   Essential hypertension, benign 12/10/2008   RAYNAUD'S DISEASE 12/10/2008   Osteoarthritis 12/10/2008   URINARY INCONTINENCE 12/10/2008    PCP: Cydney Draft, MD REFERRING PROVIDER: Cydney Draft, *  REFERRING DIAG: M17.0 (ICD-10-CM) - Primary osteoarthritis of both kneesM47.816 (ICD-10-CM) - Lumbar spondylosisR29.898 (ICD-10-CM) - Weakness  of both lower extremities  THERAPY DIAG:  Chronic pain of right knee  Chronic pain of left knee  Other abnormalities of gait and mobility  Muscle weakness (generalized)  Other low back pain  Rationale for Evaluation and Treatment: Rehabilitation  ONSET DATE: lastest episode   SUBJECTIVE:   SUBJECTIVE STATEMENT: Patient returned to physical therapy stating that she was able walk around the store with the cart successfully, and she even was able to do two stores in one day. She did note that when she does this, she has to be specific as to what items she needs to get, because she does not have enough endurance to casually shop. Still, she  feels like she has been making good progress following the knee and lumbar injections, as well as with physical therapy.  PERTINENT HISTORY: Diabetes, HTN, TIA, spinal stenosis, polymyalgia rheumatica   PAIN:  Are you having pain? Yes: NPRS scale: 5/10  Pain location: bilateral knees  Pain description: sore Aggravating factors: Raising B legs hurts the knees, trying to move after standing, pivoting to step  Relieving factors: nothing  PRECAUTIONS: Fall  WEIGHT BEARING RESTRICTIONS: No  FALLS:  Has patient fallen in last 6 months? No  LIVING ENVIRONMENT: Lives with: lives alone spouse passed away Jun 07, 2022 Lives in: House/apartment Stairs: No Has following equipment at home: Quad cane large base and shower bench, grab bars, scooter (uses husband's scooter)  PLOF: declining mobility  PATIENT GOALS: continue to get stronger, more mobility and less comfort  NEXT MD VISIT: 01/14/24  OBJECTIVE:  Note: Objective measures were completed at Evaluation unless otherwise noted.  DIAGNOSTIC FINDINGS: none  PATIENT SURVEYS:  LEFS 29 / 80 = 36.3 %  COGNITION: Overall cognitive status: Within functional limits for tasks assessed     SENSATION: Notes neuropathy with some burning in feet, dec'd sensation  EDEMA:  Bilateral swelling noted in LEs    LOWER EXTREMITY ROM: Active ROM Right eval Left eval  Knee flexion 108 103  Knee extension 0 0   (Blank rows = not tested)  LOWER EXTREMITY MMT: MMT Right eval Left eval  Hip flexion 4+ 4+  Hip extension 5 4+  Hip abduction 4+ 4+  Hip adduction    Hip internal rotation    Hip external rotation    Knee flexion 5 5  Knee extension 5 5  Ankle dorsiflexion 5 5  Ankle plantarflexion    Ankle inversion    Ankle eversion     (Blank rows = not tested)  FUNCTIONAL TESTS:  5 times sit to stand: 21 sec 2 minute walk test: 1 min 20 sec  98 ft 4/10 pain (pain went down from 6/10) Gait speed: 33 ft 29.88 seconds =  1.10  ft/sec  GAIT: Distance walked: 98 Assistive device utilized: Environmental consultant - 2 wheeled Level of assistance: Modified independence Comments: forward trunk lean  [facility] Adult PT Treatment:                                                DATE: 12/19/2023 Neuromuscular Re-education: Cable column: Waist strap walk out/backs, forward, lateral R, lateral L, 2 x 4 steps each, 10 lbs, with single point cane and therapist handhold for balance Therapeutic Exercise: Seated: Tilt board calf press resisted by black Acull band, 2 x 20 Fitter board hamstring curl, 1 strong band, 2 x 20  R and L Green Acull band assisted sit-to-stand, 2x6  [facility] Adult PT Treatment:                                                DATE: 12/17/23 Therapeutic Exercise: LAQ 2 x 10 @ 1.5 lbs  Standing calf raise 2 x 10; counter support  Standing hip abduction 2 x 10; counter support  Therapeutic Activity: Sit to stand with bicep curl to overhead 1# x 10  Standing march BUE support 2 x 10   [facility] Adult PT Treatment:                                                DATE: 12/12/23 Therapeutic Exercise: Seated Overhead press 1# x 12 reps LAQ with ball hold anteriorly for core engagement 1# x 10 reps R and L alternating Marching will ball held overhead to engage core x 10  Ankle pumps during active rest break between walking laps Therapeutic Activity: Sit<>stand x 10 reps Ball between hands chest level push out, pull in x 10  Ball btn hands wood chopping x 10 L/R  Shoulder ER in sitting red TB x 10  W red TB x 10  Sit>stand attempting reaching overhead walking UE's up along the overhead cabinet  Side steps along countertop R and L ~ 10 feet x 4    PATIENT EDUCATION:  Education details: HEP review Person educated: Patient Education method: Explanation Education comprehension: verbalized understanding  HOME EXERCISE PROGRAM: Access Code: [email or web address] URL: [email or web address] Date: 12/03/2023 Prepared by: Forrestine Ike  Exercises - Supine Active Straight Leg Raise  - 1 x daily - 7 x weekly - 1 sets - 10 reps - Supine Bridge  - 2 x daily - 4 x weekly - 1 sets - 10 reps - Heel Toe Raises with Counter Support  - 2 x daily - 7 x weekly - 1 sets - 10 reps - Seated March  - 2 x daily - 7 x weekly - 1 sets - 10 reps - Sidelying hip Flexion and ABDuction  - 1 x daily - 3 x weekly - 2 sets - 10 reps  Patient Education - walking program   ASSESSMENT:  CLINICAL IMPRESSION: Continued focus on improving lower extremity strength, activity tolerance, and sit-to-stand performance. Patient tolerated session well without any reports of pain interference, but was fatigued after each set of the sit-to-stand and walkout exercises. Physical therapy remains indicated to continue addressing these impairments to promote function.   Eval: Patient is a 73 y.o. female who was seen today for physical therapy evaluation and treatment for weakness in B LE and chronic B knee and back pain. She was last seen in this clinic in February. She reports some improvements since that time. She is able to get into her car easier and she is having less popping in her knees. She is working to lose weight in order to get TKR surgery, however, her MD is concerned that she is losing muscle mass. She is limited with ambulation with her RW to 1 min and 20 sec. During that time she was able to walk 98 ft. Her LEFS 29/80. She also demonstrates B LE weakness. She will benefit from skilled  PT to address these deficits.    OBJECTIVE IMPAIRMENTS: decreased activity tolerance, decreased mobility, decreased strength, postural dysfunction, obesity, and pain.   GOALS: Goals reviewed with patient? Yes SHORT TERM GOALS: Target date: 12/17/23 Ind with initial HEP Baseline: Goal status: MET  2.  Able to complete 2 min walk test showing improved strength. Baseline: 1 min 20 sec/ 98 ft 12/03/23: 2.5 minutes, 2 laps in clinic  Goal status: MET  LONG TERM  GOALS: Target date: 01/14/24  Patient to demonstrate improved gait speed to >=1.6 ft/sec to demo improved community ambulation status Baseline: 1.10 ft/sec 11/21/23 Goal status: INITIAL  2.  Pt able to perform 5XSTS in < = to 16 seconds demonstrating improved strength Baseline: 21 sec Goal status: INITIAL  3.  Patient able to ambulate 300 feet without a sitting break to improve community ambulation. Baseline:  Goal status: INITIAL  PLAN:  PT FREQUENCY: 2x/week  PT DURATION: 8 weeks  PLANNED INTERVENTIONS: 97164- PT Re-evaluation, 97110-Therapeutic exercises, 97530- Therapeutic activity, [id number]- Neuromuscular re-education, 97535- Self Care, 16109- Manual therapy, [phone]- Gait training, [phone]- Aquatic Therapy, Patient/Family education, Balance training, Stair training, Joint mobilization, DME instructions, Cryotherapy, and Moist heat  PLAN FOR NEXT SESSION: work on LE strength, sit to stand, gait endurance.  Jeannette Mills, PT, PhD, DPT  12/19/23 12:45 PM

## 2023-12-23 NOTE — Therapy (Signed)
 OUTPATIENT PHYSICAL THERAPY LOWER EXTREMITY TREATMENT   Patient Name: Natalie Wright MRN: [id number] DOB:1950/12/06, 73 y.o., female Today's Date: 12/24/2023  END OF SESSION:  PT End of Session - 12/24/23 1023     Visit Number 9    Date for PT Re-Evaluation 01/14/24    Authorization Type medicare and tricare    Authorization - Visit Number 34    Progress Note Due on Visit 10    PT Start Time 1022    PT Stop Time 1101    PT Time Calculation (min) 39 min    Activity Tolerance Patient tolerated treatment well    Behavior During Therapy WFL for tasks assessed/performed           Past Medical History:  Diagnosis Date   Allergy    to cats and dogs   Anemia    Arthritis    Cataract    Diabetes mellitus    GERD (gastroesophageal reflux disease)    Glaucoma    Hiatal hernia    and delayed gastric emptying/ sees [facility] for recurrent diarrhea   History of cardiovascular disorder 12/10/2008   Qualifier: Diagnosis of  By: Alvan MD, Catherine     Hypertension    OSA (obstructive sleep apnea)    CPAP   Pedal edema    Sleep apnea    Spastic bladder    urology in [place]   TIA (transient ischemic attack)    hx of- sees Dr Robin (Neurology)   Past Surgical History:  Procedure Laterality Date   CATARACT EXTRACTION  08/2021   IR ANGIOGRAM SELECTIVE EACH ADDITIONAL VESSEL  04/19/2023   IR ANGIOGRAM SELECTIVE EACH ADDITIONAL VESSEL  04/19/2023   IR ANGIOGRAM SELECTIVE EACH ADDITIONAL VESSEL  04/19/2023   IR EMBO ARTERIAL NOT HEMORR HEMANG INC GUIDE ROADMAPPING  04/19/2023   IR EMBO ARTERIAL NOT HEMORR HEMANG INC GUIDE ROADMAPPING  06/07/2023   IR RADIOLOGIST EVAL & MGMT  03/08/2023   IR RADIOLOGIST EVAL & MGMT  05/20/2023   IR RADIOLOGIST EVAL & MGMT  07/19/2023   IR US  GUIDE VASC ACCESS RIGHT  04/19/2023   TUBAL LIGATION     vaginal skin tag removal     Patient Active Problem List   Diagnosis Date Noted   BMI 50.0-59.9, adult (HCC) 06/04/2023   Diabetic  retinopathy (HCC) 02/26/2023   Lumbar spondylosis 07/03/2022   Lumbar spinal stenosis 02/26/2022   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/2022   Bilateral hip pain 01/12/2022   Hearing loss due to cerumen impaction, right 01/12/2022   Hammertoe, bilateral 08/23/2021   History of foot ulcer 08/23/2021   Primary osteoarthritis of both knees 12/09/2020   History of burning pain in leg 08/29/2020   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   Tremor 05/05/2020   Pre-ulcerative corn or callous 01/29/2020   History of transient ischemic attack (TIA) 05/15/2018   Family history of ovarian cancer 04/02/2017   Aortic atherosclerosis (HCC) 08/31/2016   Venous stasis 08/10/2016   Palpitations 07/16/2016   Atrial dilatation, left 07/16/2016   Atrial septal aneurysm 07/16/2016   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   Cataract 04/18/2012   Hyperlipidemia 02/13/2011   Pulmonary nodules 09/22/2010   LEG PAIN 09/12/2009   Sleep apnea 04/13/2009   GERD 03/14/2009   Morbid obesity (HCC) 12/10/2008   Essential hypertension, benign 12/10/2008   RAYNAUD'S DISEASE 12/10/2008   Osteoarthritis 12/10/2008   URINARY INCONTINENCE 12/10/2008    PCP: Alvan Dorothyann BIRCH, MD REFERRING PROVIDER: Curtis,  Debby PARAS,*  REFERRING DIAG: M17.0 (ICD-10-CM) - Primary osteoarthritis of both kneesM47.816 (ICD-10-CM) - Lumbar spondylosisR29.898 (ICD-10-CM) - Weakness of both lower extremities  THERAPY DIAG:  Chronic pain of right knee  Chronic pain of left knee  Other abnormalities of gait and mobility  Muscle weakness (generalized)  Other low back pain  Rationale for Evaluation and Treatment: Rehabilitation  ONSET DATE: lastest episode   SUBJECTIVE:   SUBJECTIVE STATEMENT: I'm sore in the sides of my legs today.  PERTINENT HISTORY: Diabetes, HTN, TIA, spinal stenosis, polymyalgia rheumatica   PAIN:  Are you having pain? Yes: NPRS scale: 6/10  Pain location:  bilateral knees  Pain description: sore Aggravating factors: Raising B legs hurts the knees, trying to move after standing, pivoting to step  Relieving factors: nothing  PRECAUTIONS: Fall  WEIGHT BEARING RESTRICTIONS: No  FALLS:  Has patient fallen in last 6 months? No  LIVING ENVIRONMENT: Lives with: lives alone spouse passed away 05-19-2022 Lives in: House/apartment Stairs: No Has following equipment at home: Quad cane large base and shower bench, grab bars, scooter (uses husband's scooter)  PLOF: declining mobility  PATIENT GOALS: continue to get stronger, more mobility and less comfort  NEXT MD VISIT: 01/14/24  OBJECTIVE:  Note: Objective measures were completed at Evaluation unless otherwise noted.  DIAGNOSTIC FINDINGS: none  PATIENT SURVEYS:  LEFS 29 / 80 = 36.3 %  COGNITION: Overall cognitive status: Within functional limits for tasks assessed     SENSATION: Notes neuropathy with some burning in feet, dec'd sensation  EDEMA:  Bilateral swelling noted in LEs    LOWER EXTREMITY ROM: Active ROM Right eval Left eval  Knee flexion 108 103  Knee extension 0 0   (Blank rows = not tested)  LOWER EXTREMITY MMT: MMT Right eval Left eval  Hip flexion 4+ 4+  Hip extension 5 4+  Hip abduction 4+ 4+  Hip adduction    Hip internal rotation    Hip external rotation    Knee flexion 5 5  Knee extension 5 5  Ankle dorsiflexion 5 5  Ankle plantarflexion    Ankle inversion    Ankle eversion     (Blank rows = not tested)  FUNCTIONAL TESTS:  5 times sit to stand: 21 sec 2 minute walk test: 1 min 20 sec  98 ft 4/10 pain (pain went down from 6/10) Gait speed: 33 ft 29.88 seconds =  1.10 ft/sec  GAIT: Distance walked: 98 Assistive device utilized: Environmental consultant - 2 wheeled Level of assistance: Modified independence Comments: forward trunk lean  [facility] Adult PT Treatment:                                                DATE: 12/24/2023 Neuromuscular Re-education: Cable  column: Waist strap walk out/backs, forward, lateral R, lateral L, 2 x 4 steps each, 10 lbs, with single point cane and therapist handhold for balance Self Care: Discussed Rollator walker requirements  Therapeutic Exercise: Standing heel raises 2x 10 Seated: Tilt board calf press resisted by black Acull band, 2 x 10 5 sec hold Fitter board hamstring curl, 1 strong band, 2 x 10 R and L Green Acull band assisted sit-to-stand, 2x6   [facility] Adult PT Treatment:  DATE: 12/19/2023 Neuromuscular Re-education: Cable column: Waist strap walk out/backs, forward, lateral R, lateral L, 2 x 4 steps each, 10 lbs, with single point cane and therapist handhold for balance Therapeutic Exercise: Seated: Tilt board calf press resisted by black Acull band, 2 x 20 Fitter board hamstring curl, 1 strong band, 2 x 20 R and L Green Acull band assisted sit-to-stand, 2x6  [facility] Adult PT Treatment:                                                DATE: 12/17/23 Therapeutic Exercise: LAQ 2 x 10 @ 1.5 lbs  Standing calf raise 2 x 10; counter support  Standing hip abduction 2 x 10; counter support  Therapeutic Activity: Sit to stand with bicep curl to overhead 1# x 10  Standing march BUE support 2 x 10   [facility] Adult PT Treatment:                                                DATE: 12/12/23 Therapeutic Exercise: Seated Overhead press 1# x 12 reps LAQ with ball hold anteriorly for core engagement 1# x 10 reps R and L alternating Marching will ball held overhead to engage core x 10  Ankle pumps during active rest break between walking laps Therapeutic Activity: Sit<>stand x 10 reps Ball between hands chest level push out, pull in x 10  Ball btn hands wood chopping x 10 L/R  Shoulder ER in sitting red TB x 10  W red TB x 10  Sit>stand attempting reaching overhead walking UE's up along the overhead cabinet  Side steps along countertop R and L ~ 10 feet x 4    PATIENT  EDUCATION:  Education details: HEP review Person educated: Patient Education method: Explanation Education comprehension: verbalized understanding  HOME EXERCISE PROGRAM: Access Code: [email or web address] URL: [email or web address] Date: 12/03/2023 Prepared by: Lucie Meeter  Exercises - Supine Active Straight Leg Raise  - 1 x daily - 7 x weekly - 1 sets - 10 reps - Supine Bridge  - 2 x daily - 4 x weekly - 1 sets - 10 reps - Heel Toe Raises with Counter Support  - 2 x daily - 7 x weekly - 1 sets - 10 reps - Seated March  - 2 x daily - 7 x weekly - 1 sets - 10 reps - Sidelying hip Flexion and ABDuction  - 1 x daily - 3 x weekly - 2 sets - 10 reps  Patient Education - walking program   ASSESSMENT:  CLINICAL IMPRESSION: Kaisy tolerated repeat of previous exercises well. She was able to walk further with resisted walk outs with B UE support. She did require short rest breaks between directions Increased pain in B lateral thighs reported today on arrival. No increase during session. 10th visit next.   Eval: Patient is a 73 y.o. female who was seen today for physical therapy evaluation and treatment for weakness in B LE and chronic B knee and back pain. She was last seen in this clinic in February. She reports some improvements since that time. She is able to get into her car easier and she is having less popping in her knees. She is working to lose  weight in order to get TKR surgery, however, her MD is concerned that she is losing muscle mass. She is limited with ambulation with her RW to 1 min and 20 sec. During that time she was able to walk 98 ft. Her LEFS 29/80. She also demonstrates B LE weakness. She will benefit from skilled PT to address these deficits.    OBJECTIVE IMPAIRMENTS: decreased activity tolerance, decreased mobility, decreased strength, postural dysfunction, obesity, and pain.   GOALS: Goals reviewed with patient? Yes SHORT TERM GOALS: Target date: 12/17/23 Ind  with initial HEP Baseline: Goal status: MET  2.  Able to complete 2 min walk test showing improved strength. Baseline: 1 min 20 sec/ 98 ft 12/03/23: 2.5 minutes, 2 laps in clinic  Goal status: MET  LONG TERM GOALS: Target date: 01/14/24  Patient to demonstrate improved gait speed to >=1.6 ft/sec to demo improved community ambulation status Baseline: 1.10 ft/sec 11/21/23 Goal status: INITIAL  2.  Pt able to perform 5XSTS in < = to 16 seconds demonstrating improved strength Baseline: 21 sec Goal status: INITIAL  3.  Patient able to ambulate 300 feet without a sitting break to improve community ambulation. Baseline:  Goal status: INITIAL  PLAN:  PT FREQUENCY: 2x/week  PT DURATION: 8 weeks  PLANNED INTERVENTIONS: [id number]- PT Re-evaluation, 97110-Therapeutic exercises, 97530- Therapeutic activity, [id number]- Neuromuscular re-education, 97535- Self Care, [id number]- Manual therapy, [phone]- Gait training, [phone]- Aquatic Therapy, Patient/Family education, Balance training, Stair training, Joint mobilization, DME instructions, Cryotherapy, and Moist heat  PLAN FOR NEXT SESSION: work on LE strength, sit to stand, gait endurance.  Mliss Cummins, PT  12/24/23 11:03 AM

## 2023-12-24 ENCOUNTER — Encounter: Payer: Self-pay | Admitting: Physical Therapy

## 2023-12-24 ENCOUNTER — Ambulatory Visit: Admitting: Physical Therapy

## 2023-12-24 DIAGNOSIS — G8929 Other chronic pain: Secondary | ICD-10-CM | POA: Diagnosis not present

## 2023-12-24 DIAGNOSIS — M5459 Other low back pain: Secondary | ICD-10-CM

## 2023-12-24 DIAGNOSIS — R2689 Other abnormalities of gait and mobility: Secondary | ICD-10-CM | POA: Diagnosis not present

## 2023-12-24 DIAGNOSIS — M25561 Pain in right knee: Secondary | ICD-10-CM | POA: Diagnosis not present

## 2023-12-24 DIAGNOSIS — M6281 Muscle weakness (generalized): Secondary | ICD-10-CM | POA: Diagnosis not present

## 2023-12-24 DIAGNOSIS — M25562 Pain in left knee: Secondary | ICD-10-CM | POA: Diagnosis not present

## 2023-12-26 ENCOUNTER — Ambulatory Visit: Admitting: Physical Therapy

## 2024-01-01 ENCOUNTER — Ambulatory Visit: Attending: Sports Medicine | Admitting: Rehabilitative and Restorative Service Providers"

## 2024-01-01 ENCOUNTER — Encounter: Payer: Self-pay | Admitting: Rehabilitative and Restorative Service Providers"

## 2024-01-01 DIAGNOSIS — R2689 Other abnormalities of gait and mobility: Secondary | ICD-10-CM | POA: Diagnosis present

## 2024-01-01 DIAGNOSIS — M6281 Muscle weakness (generalized): Secondary | ICD-10-CM | POA: Insufficient documentation

## 2024-01-01 DIAGNOSIS — M5459 Other low back pain: Secondary | ICD-10-CM | POA: Insufficient documentation

## 2024-01-01 DIAGNOSIS — G8929 Other chronic pain: Secondary | ICD-10-CM | POA: Diagnosis present

## 2024-01-01 DIAGNOSIS — M25561 Pain in right knee: Secondary | ICD-10-CM | POA: Insufficient documentation

## 2024-01-01 DIAGNOSIS — M25562 Pain in left knee: Secondary | ICD-10-CM | POA: Diagnosis present

## 2024-01-01 NOTE — Therapy (Signed)
 OUTPATIENT PHYSICAL THERAPY LOWER EXTREMITY TREATMENT and 10th visit note   Patient Name: Natalie Wright MRN: [id number] DOB:1950-07-31, 73 y.o., female Today's Date: 01/01/2024  Physical Therapy Progress Note   Dates of Reporting Period:11/19/23-01/01/24  Thank you for the referral of this patient.   END OF SESSION:  PT End of Session - 01/01/24 1151     Visit Number 10    Number of Visits 17    Date for PT Re-Evaluation 01/14/24    Authorization Type medicare and tricare    Progress Note Due on Visit 20    PT Start Time 1151    PT Stop Time 1231    PT Time Calculation (min) 40 min    Activity Tolerance Patient tolerated treatment well    Behavior During Therapy WFL for tasks assessed/performed          Past Medical History:  Diagnosis Date   Allergy    to cats and dogs   Anemia    Arthritis    Cataract    Diabetes mellitus    GERD (gastroesophageal reflux disease)    Glaucoma    Hiatal hernia    and delayed gastric emptying/ sees [facility] for recurrent diarrhea   History of cardiovascular disorder 12/10/2008   Qualifier: Diagnosis of  By: Alvan MD, Catherine     Hypertension    OSA (obstructive sleep apnea)    CPAP   Pedal edema    Sleep apnea    Spastic bladder    urology in [place]   TIA (transient ischemic attack)    hx of- sees Dr Robin (Neurology)   Past Surgical History:  Procedure Laterality Date   CATARACT EXTRACTION  08/2021   IR ANGIOGRAM SELECTIVE EACH ADDITIONAL VESSEL  04/19/2023   IR ANGIOGRAM SELECTIVE EACH ADDITIONAL VESSEL  04/19/2023   IR ANGIOGRAM SELECTIVE EACH ADDITIONAL VESSEL  04/19/2023   IR EMBO ARTERIAL NOT HEMORR HEMANG INC GUIDE ROADMAPPING  04/19/2023   IR EMBO ARTERIAL NOT HEMORR HEMANG INC GUIDE ROADMAPPING  06/07/2023   IR RADIOLOGIST EVAL & MGMT  03/08/2023   IR RADIOLOGIST EVAL & MGMT  05/20/2023   IR RADIOLOGIST EVAL & MGMT  07/19/2023   IR US  GUIDE VASC ACCESS RIGHT  04/19/2023   TUBAL LIGATION     vaginal  skin tag removal     Patient Active Problem List   Diagnosis Date Noted   BMI 50.0-59.9, adult (HCC) 06/04/2023   Diabetic retinopathy (HCC) 02/26/2023   Lumbar spondylosis 07/03/2022   Lumbar spinal stenosis 02/26/2022   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/2022   Bilateral hip pain 01/12/2022   Hearing loss due to cerumen impaction, right 01/12/2022   Hammertoe, bilateral 08/23/2021   History of foot ulcer 08/23/2021   Primary osteoarthritis of both knees 12/09/2020   History of burning pain in leg 08/29/2020   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   Tremor 05/05/2020   Pre-ulcerative corn or callous 01/29/2020   History of transient ischemic attack (TIA) 05/15/2018   Family history of ovarian cancer 04/02/2017   Aortic atherosclerosis (HCC) 08/31/2016   Venous stasis 08/10/2016   Palpitations 07/16/2016   Atrial dilatation, left 07/16/2016   Atrial septal aneurysm 07/16/2016   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   Cataract 04/18/2012   Hyperlipidemia 02/13/2011   Pulmonary nodules 09/22/2010   LEG PAIN 09/12/2009   Sleep apnea 04/13/2009   GERD 03/14/2009   Morbid obesity (HCC) 12/10/2008   Essential hypertension, benign 12/10/2008  RAYNAUD'S DISEASE 12/10/2008   Osteoarthritis 12/10/2008   URINARY INCONTINENCE 12/10/2008    PCP: Alvan Dorothyann BIRCH, MD REFERRING PROVIDER: Curtis Debby PARAS,* REFERRING DIAG: M17.0 (ICD-10-CM) - Primary osteoarthritis of both kneesM47.816 (ICD-10-CM) - Lumbar spondylosisR29.898 (ICD-10-CM) - Weakness of both lower extremities  THERAPY DIAG:  Chronic pain of right knee  Chronic pain of left knee  Other abnormalities of gait and mobility  Muscle weakness (generalized)  Rationale for Evaluation and Treatment: Rehabilitation  ONSET DATE: lastest episode   SUBJECTIVE:   SUBJECTIVE STATEMENT: The patient is increasing her walking. Today she is having more pain in her knees. Overall,  symptoms vary. She has been able to do some shopping. She had company with her sister visiting. She bought a Product manager for community (it is on order).   PERTINENT HISTORY: Diabetes, HTN, TIA, spinal stenosis, polymyalgia rheumatica   PAIN:  Are you having pain? Yes: NPRS scale: 6/10  Pain location: bilateral knees  Pain description: sore Aggravating factors: Raising B legs hurts the knees, trying to move after standing, pivoting to step  Relieving factors: nothing  PRECAUTIONS: Fall  WEIGHT BEARING RESTRICTIONS: No  FALLS:  Has patient fallen in last 6 months? No  LIVING ENVIRONMENT: Lives with: lives alone spouse passed away May 17, 2022 Lives in: House/apartment Stairs: No Has following equipment at home: Quad cane large base and shower bench, grab bars, scooter (uses husband's scooter)  PLOF: declining mobility  PATIENT GOALS: continue to get stronger, more mobility and less comfort  NEXT MD VISIT: 01/14/24  OBJECTIVE:  Note: Objective measures were completed at Evaluation unless otherwise noted.  DIAGNOSTIC FINDINGS: none  PATIENT SURVEYS:  LEFS 29 / 80 = 36.3 %  COGNITION: Overall cognitive status: Within functional limits for tasks assessed     SENSATION: Notes neuropathy with some burning in feet, dec'd sensation  EDEMA:  Bilateral swelling noted in LEs    LOWER EXTREMITY ROM: Active ROM Right eval Left eval  Knee flexion 108 103  Knee extension 0 0   (Blank rows = not tested)  LOWER EXTREMITY MMT: MMT Right eval Left eval  Hip flexion 4+ 4+  Hip extension 5 4+  Hip abduction 4+ 4+  Hip adduction    Hip internal rotation    Hip external rotation    Knee flexion 5 5  Knee extension 5 5  Ankle dorsiflexion 5 5  Ankle plantarflexion    Ankle inversion    Ankle eversion     (Blank rows = not tested)  FUNCTIONAL TESTS:  5 times sit to stand: 21 sec 2 minute walk test: 1 min 20 sec  98 ft 4/10 pain (pain went down from 6/10) Gait speed: 33 ft  29.88 seconds =  1.10 ft/sec  GAIT: Distance walked: 98 Assistive device utilized: Environmental consultant - 2 wheeled Level of assistance: Modified independence Comments: forward trunk lean  [facility] Adult PT Treatment:                                                DATE: 01/01/24 Therapeutic Exercise: Standing Heel raises x 10 reps Trunk flexion/extension x 10 reps Hip abduction x 10 reps Seated LAQ x 10 with green acull band  HS cur x 10 with green acull band Therapeutic Activity: Sit<>stand 5 reps x 2 sets Sit<>stand x 5 reps with overhead reaching to engage core Gait: 170 ft  nonstop with RW mod indep 110 ft nonstop with RW mod indep 80 ft nonstop with RW mod indep Patient gets increased back pain when attempting to stand upright    [facility] Adult PT Treatment:                                                DATE: 12/24/2023 Neuromuscular Re-education: Cable column: Waist strap walk out/backs, forward, lateral R, lateral L, 2 x 4 steps each, 10 lbs, with single point cane and therapist handhold for balance Self Care: Discussed Rollator walker requirements  Therapeutic Exercise: Standing heel raises 2x 10 Seated: Tilt board calf press resisted by black Acull band, 2 x 10 5 sec hold Fitter board hamstring curl, 1 strong band, 2 x 10 R and L Green Acull band assisted sit-to-stand, 2x6   [facility] Adult PT Treatment:                                                DATE: 12/19/2023 Neuromuscular Re-education: Cable column: Waist strap walk out/backs, forward, lateral R, lateral L, 2 x 4 steps each, 10 lbs, with single point cane and therapist handhold for balance Therapeutic Exercise: Seated: Tilt board calf press resisted by black Acull band, 2 x 20 Fitter board hamstring curl, 1 strong band, 2 x 20 R and L Green Acull band assisted sit-to-stand, 2x6   [facility] Adult PT Treatment:                                                DATE: 12/17/23 Therapeutic Exercise: LAQ 2 x 10 @ 1.5 lbs  Standing calf  raise 2 x 10; counter support  Standing hip abduction 2 x 10; counter support  Therapeutic Activity: Sit to stand with bicep curl to overhead 1# x 10  Standing march BUE support 2 x 10    [facility] Adult PT Treatment:                                                DATE: 12/12/23 Therapeutic Exercise: Seated Overhead press 1# x 12 reps LAQ with ball hold anteriorly for core engagement 1# x 10 reps R and L alternating Marching will ball held overhead to engage core x 10  Ankle pumps during active rest break between walking laps Therapeutic Activity: Sit<>stand x 10 reps Ball between hands chest level push out, pull in x 10  Ball btn hands wood chopping x 10 L/R  Shoulder ER in sitting red TB x 10  W red TB x 10  Sit>stand attempting reaching overhead walking UE's up along the overhead cabinet  Side steps along countertop R and L ~ 10 feet x 4    PATIENT EDUCATION:  Education details: HEP review Person educated: Patient Education method: Explanation Education comprehension: verbalized understanding  HOME EXERCISE PROGRAM: Access Code: [email or web address] URL: [email or web address] Date: 12/03/2023 Prepared by: Lucie Meeter  Exercises - Supine Active Straight Leg Raise  - 1 x  daily - 7 x weekly - 1 sets - 10 reps - Supine Bridge  - 2 x daily - 4 x weekly - 1 sets - 10 reps - Heel Toe Raises with Counter Support  - 2 x daily - 7 x weekly - 1 sets - 10 reps - Seated March  - 2 x daily - 7 x weekly - 1 sets - 10 reps - Sidelying hip Flexion and ABDuction  - 1 x daily - 3 x weekly - 2 sets - 10 reps  Patient Education - walking program   ASSESSMENT:  CLINICAL IMPRESSION: The patient has met 2 STGs. She has improved walking from tolerating 98 ft nonstop to 170 ft nonstop today. She continues to have limitations with gait and trunk strength leaning on her elbows at times with walking-- when PT encourages upright posture, the patient gets significant fatigue in her low back and  discomfort that stops when she sits to rest. This also limits walking distance. PT to continue working to [employer]. Skilled care is indicated to continue to progress strengthening, gait training, balance, and endurance needed for functional mobility.    Eval: Patient is a 73 y.o. female who was seen today for physical therapy evaluation and treatment for weakness in B LE and chronic B knee and back pain. She was last seen in this clinic in February. She reports some improvements since that time. She is able to get into her car easier and she is having less popping in her knees. She is working to lose weight in order to get TKR surgery, however, her MD is concerned that she is losing muscle mass. She is limited with ambulation with her RW to 1 min and 20 sec. During that time she was able to walk 98 ft. Her LEFS 29/80. She also demonstrates B LE weakness. She will benefit from skilled PT to address these deficits.    OBJECTIVE IMPAIRMENTS: decreased activity tolerance, decreased mobility, decreased strength, postural dysfunction, obesity, and pain.   GOALS: Goals reviewed with patient? Yes SHORT TERM GOALS: Target date: 12/17/23 Ind with initial HEP Baseline: Goal status: MET  2.  Able to complete 2 min walk test showing improved strength. Baseline: 1 min 20 sec/ 98 ft 12/03/23: 2.5 minutes, 2 laps in clinic  (160 ft) Goal status: MET  LONG TERM GOALS: Target date: 01/14/24  Patient to demonstrate improved gait speed to >=1.6 ft/sec to demo improved community ambulation status Baseline: 1.10 ft/sec 11/21/23 Goal status: INITIAL  2.  Pt able to perform 5XSTS in < = to 16 seconds demonstrating improved strength Baseline: 21 sec ; 01/01/24: 21.2 seconds Goal status: IN PROGRESS  3.  Patient able to ambulate 300 feet without a sitting break to improve community ambulation. Baseline:  Goal status: PARTIALLY MET  PLAN:  PT FREQUENCY: 2x/week  PT DURATION: 8 weeks  PLANNED INTERVENTIONS: 97164- PT  Re-evaluation, 97110-Therapeutic exercises, 97530- Therapeutic activity, [id number]- Neuromuscular re-education, 97535- Self Care, 02859- Manual therapy, [phone]- Gait training, [phone]- Aquatic Therapy, Patient/Family education, Balance training, Stair training, Joint mobilization, DME instructions, Cryotherapy, and Moist heat  PLAN FOR NEXT SESSION: work on LE strength, sit to stand, gait endurance.  Adrianne Shackleton, PT 01/01/24 4:13 PM

## 2024-01-07 ENCOUNTER — Ambulatory Visit: Admitting: Rehabilitative and Restorative Service Providers"

## 2024-01-07 ENCOUNTER — Encounter: Payer: Self-pay | Admitting: Rehabilitative and Restorative Service Providers"

## 2024-01-07 DIAGNOSIS — M6281 Muscle weakness (generalized): Secondary | ICD-10-CM | POA: Diagnosis not present

## 2024-01-07 DIAGNOSIS — M5459 Other low back pain: Secondary | ICD-10-CM

## 2024-01-07 DIAGNOSIS — R2689 Other abnormalities of gait and mobility: Secondary | ICD-10-CM | POA: Diagnosis not present

## 2024-01-07 DIAGNOSIS — M25562 Pain in left knee: Secondary | ICD-10-CM | POA: Diagnosis not present

## 2024-01-07 DIAGNOSIS — M25561 Pain in right knee: Secondary | ICD-10-CM | POA: Diagnosis not present

## 2024-01-07 DIAGNOSIS — G8929 Other chronic pain: Secondary | ICD-10-CM

## 2024-01-07 NOTE — Therapy (Signed)
 OUTPATIENT PHYSICAL THERAPY LOWER EXTREMITY TREATMENT and 10th visit note   Patient Name: Natalie Wright MRN: [id number] DOB:May 28, 1951, 73 y.o., female Today's Date: 01/07/2024  Physical Therapy Progress Note   Dates of Reporting Period:11/19/23-01/01/24  Thank you for the referral of this patient.   END OF SESSION:  PT End of Session - 01/07/24 1105     Visit Number 11    Number of Visits 17    Date for PT Re-Evaluation 01/14/24    Authorization Type medicare and tricare    Authorization - Visit Number 35    Progress Note Due on Visit 20    PT Start Time 1105    PT Stop Time 1145    PT Time Calculation (min) 40 min    Activity Tolerance Patient tolerated treatment well          Past Medical History:  Diagnosis Date   Allergy    to cats and dogs   Anemia    Arthritis    Cataract    Diabetes mellitus    GERD (gastroesophageal reflux disease)    Glaucoma    Hiatal hernia    and delayed gastric emptying/ sees [facility] for recurrent diarrhea   History of cardiovascular disorder 12/10/2008   Qualifier: Diagnosis of  By: Alvan MD, Catherine     Hypertension    OSA (obstructive sleep apnea)    CPAP   Pedal edema    Sleep apnea    Spastic bladder    urology in [place]   TIA (transient ischemic attack)    hx of- sees Dr Robin (Neurology)   Past Surgical History:  Procedure Laterality Date   CATARACT EXTRACTION  08/2021   IR ANGIOGRAM SELECTIVE EACH ADDITIONAL VESSEL  04/19/2023   IR ANGIOGRAM SELECTIVE EACH ADDITIONAL VESSEL  04/19/2023   IR ANGIOGRAM SELECTIVE EACH ADDITIONAL VESSEL  04/19/2023   IR EMBO ARTERIAL NOT HEMORR HEMANG INC GUIDE ROADMAPPING  04/19/2023   IR EMBO ARTERIAL NOT HEMORR HEMANG INC GUIDE ROADMAPPING  06/07/2023   IR RADIOLOGIST EVAL & MGMT  03/08/2023   IR RADIOLOGIST EVAL & MGMT  05/20/2023   IR RADIOLOGIST EVAL & MGMT  07/19/2023   IR US  GUIDE VASC ACCESS RIGHT  04/19/2023   TUBAL LIGATION     vaginal skin tag removal      Patient Active Problem List   Diagnosis Date Noted   BMI 50.0-59.9, adult (HCC) 06/04/2023   Diabetic retinopathy (HCC) 02/26/2023   Lumbar spondylosis 07/03/2022   Lumbar spinal stenosis 02/26/2022   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/2022   Bilateral hip pain 01/12/2022   Hearing loss due to cerumen impaction, right 01/12/2022   Hammertoe, bilateral 08/23/2021   History of foot ulcer 08/23/2021   Primary osteoarthritis of both knees 12/09/2020   History of burning pain in leg 08/29/2020   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   Tremor 05/05/2020   Pre-ulcerative corn or callous 01/29/2020   History of transient ischemic attack (TIA) 05/15/2018   Family history of ovarian cancer 04/02/2017   Aortic atherosclerosis (HCC) 08/31/2016   Venous stasis 08/10/2016   Palpitations 07/16/2016   Atrial dilatation, left 07/16/2016   Atrial septal aneurysm 07/16/2016   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   Cataract 04/18/2012   Hyperlipidemia 02/13/2011   Pulmonary nodules 09/22/2010   LEG PAIN 09/12/2009   Sleep apnea 04/13/2009   GERD 03/14/2009   Morbid obesity (HCC) 12/10/2008   Essential hypertension, benign 12/10/2008   RAYNAUD'S DISEASE  12/10/2008   Osteoarthritis 12/10/2008   URINARY INCONTINENCE 12/10/2008    PCP: Alvan Dorothyann BIRCH, MD REFERRING PROVIDER: Curtis Debby PARAS,* REFERRING DIAG: M17.0 (ICD-10-CM) - Primary osteoarthritis of both kneesM47.816 (ICD-10-CM) - Lumbar spondylosisR29.898 (ICD-10-CM) - Weakness of both lower extremities  THERAPY DIAG:  Chronic pain of right knee  Chronic pain of left knee  Other abnormalities of gait and mobility  Muscle weakness (generalized)  Other low back pain  Rationale for Evaluation and Treatment: Rehabilitation  ONSET DATE: lastest episode   SUBJECTIVE:   SUBJECTIVE STATEMENT: Patient reports that she does not have back pain to speak of but she feels exhausted. She  is now doing more around the house throughout the day. She is just tired. Her blood monitor has been recalled so she has not been able to check her blood sugar level. Symptoms in back and knee vary. She bought a donut to sit on and that helps the feeling in her back when sitting. Waiting on rollator which is on order.    PERTINENT HISTORY: Diabetes, HTN, TIA, spinal stenosis, polymyalgia rheumatica   PAIN:  Are you having pain? Yes: NPRS scale: 4/10  Pain location: bilateral knees  Pain description: sore Aggravating factors: Raising B legs hurts the knees, trying to move after standing, pivoting to step  Relieving factors: nothing  PRECAUTIONS: Fall  WEIGHT BEARING RESTRICTIONS: No  FALLS:  Has patient fallen in last 6 months? No  LIVING ENVIRONMENT: Lives with: lives alone spouse passed away June 11, 2022 Lives in: House/apartment Stairs: No Has following equipment at home: Quad cane large base and shower bench, grab bars, scooter (uses husband's scooter)  PLOF: declining mobility  PATIENT GOALS: continue to get stronger, more mobility and less comfort  NEXT MD VISIT: 01/14/24  OBJECTIVE:  Note: Objective measures were completed at Evaluation unless otherwise noted.  DIAGNOSTIC FINDINGS: none  PATIENT SURVEYS:  LEFS 29 / 80 = 36.3 %  COGNITION: Overall cognitive status: Within functional limits for tasks assessed     SENSATION: Notes neuropathy with some burning in feet, dec'd sensation  EDEMA:  Bilateral swelling noted in LEs    LOWER EXTREMITY ROM: Active ROM Right eval Left eval  Knee flexion 108 103  Knee extension 0 0   (Blank rows = not tested)  LOWER EXTREMITY MMT: MMT Right eval Left eval  Hip flexion 4+ 4+  Hip extension 5 4+  Hip abduction 4+ 4+  Hip adduction    Hip internal rotation    Hip external rotation    Knee flexion 5 5  Knee extension 5 5  Ankle dorsiflexion 5 5  Ankle plantarflexion    Ankle inversion    Ankle eversion      (Blank rows = not tested)  FUNCTIONAL TESTS:  5 times sit to stand: 21 sec 2 minute walk test: 1 min 20 sec  98 ft 4/10 pain (pain went down from 6/10) Gait speed: 33 ft 29.88 seconds =  1.10 ft/sec  GAIT: Distance walked: 98 Assistive device utilized: Walker - 2 wheeled Level of assistance: Modified independence Comments: forward trunk lean  [facility] Adult PT Treatment:                                                DATE: 01/07/24 Therapeutic Exercise: Standing Heel raises x 10 reps Trunk flexion/extension x 10 reps Hip abduction x  10 reps Seated Shoulder flexion reaching overhead x 5 x 2  Row red TB 10 x 2  Shoulder ER bilat red TB x 10 x 2  LAQ sitting  Therapeutic Activity: Sit<>stand 5 reps x 2 sets Sit<>stand x 5 reps x 2 with overhead reaching to engage core Sit<>stand x 5 holding 5# KB at waist  Standing working on upright posture and alignment without assistive device to patient tolerance - 8 trials standing from ~ 20-60 seconds  Gait: 80 ft nonstop with RW x 3 VC for upright posture   [facility] Adult PT Treatment:                                                DATE: 01/01/24 Therapeutic Exercise: Standing Heel raises x 10 reps Trunk flexion/extension x 10 reps Hip abduction x 10 reps Seated LAQ x 10 with green acull band  HS cur x 10 with green acull band Therapeutic Activity: Sit<>stand 5 reps x 2 sets Sit<>stand x 5 reps with overhead reaching to engage core Gait: 170 ft nonstop with RW mod indep 110 ft nonstop with RW mod indep 80 ft nonstop with RW mod indep Patient gets increased back pain when attempting to stand upright    [facility] Adult PT Treatment:                                                DATE: 12/24/2023 Neuromuscular Re-education: Cable column: Waist strap walk out/backs, forward, lateral R, lateral L, 2 x 4 steps each, 10 lbs, with single point cane and therapist handhold for balance Self Care: Discussed Rollator walker requirements  Therapeutic  Exercise: Standing heel raises 2x 10 Seated: Tilt board calf press resisted by black Acull band, 2 x 10 5 sec hold Fitter board hamstring curl, 1 strong band, 2 x 10 R and L Green Acull band assisted sit-to-stand, 2x6   PATIENT EDUCATION:  Education details: HEP review Person educated: Patient Education method: Explanation Education comprehension: verbalized understanding  HOME EXERCISE PROGRAM: Access Code: [email or web address] URL: [email or web address] Date: 12/03/2023 Prepared by: Lucie Meeter  Exercises - Supine Active Straight Leg Raise  - 1 x daily - 7 x weekly - 1 sets - 10 reps - Supine Bridge  - 2 x daily - 4 x weekly - 1 sets - 10 reps - Heel Toe Raises with Counter Support  - 2 x daily - 7 x weekly - 1 sets - 10 reps - Seated March  - 2 x daily - 7 x weekly - 1 sets - 10 reps - Sidelying hip Flexion and ABDuction  - 1 x daily - 3 x weekly - 2 sets - 10 reps  Patient Education - walking program   ASSESSMENT:  CLINICAL IMPRESSION: Continued with strengthening in functional activities and exercises. Working on sit to stand variations; TB strengthening for posterior shoulder girdle in sitting; standing with upright posture and alignment. Patient demonstrated improved posture when focused on standing. VC to tighten gluts and take deep breaths. Patient fatigues easily with functional tasks requiring periods of rest between activities. Will add standing to HEP. Skilled care is indicated to continue to progress strengthening, gait training, balance, and endurance needed for functional mobility.  Eval: Patient is a 73 y.o. female who was seen today for physical therapy evaluation and treatment for weakness in B LE and chronic B knee and back pain. She was last seen in this clinic in February. She reports some improvements since that time. She is able to get into her car easier and she is having less popping in her knees. She is working to lose weight in order to get TKR  surgery, however, her MD is concerned that she is losing muscle mass. She is limited with ambulation with her RW to 1 min and 20 sec. During that time she was able to walk 98 ft. Her LEFS 29/80. She also demonstrates B LE weakness. She will benefit from skilled PT to address these deficits.    OBJECTIVE IMPAIRMENTS: decreased activity tolerance, decreased mobility, decreased strength, postural dysfunction, obesity, and pain.   GOALS: Goals reviewed with patient? Yes SHORT TERM GOALS: Target date: 12/17/23 Ind with initial HEP Baseline: Goal status: MET  2.  Able to complete 2 min walk test showing improved strength. Baseline: 1 min 20 sec/ 98 ft 12/03/23: 2.5 minutes, 2 laps in clinic  (160 ft) Goal status: MET  LONG TERM GOALS: Target date: 01/14/24  Patient to demonstrate improved gait speed to >=1.6 ft/sec to demo improved community ambulation status Baseline: 1.10 ft/sec 11/21/23 Goal status: INITIAL  2.  Pt able to perform 5XSTS in < = to 16 seconds demonstrating improved strength Baseline: 21 sec ; 01/01/24: 21.2 seconds Goal status: IN PROGRESS  3.  Patient able to ambulate 300 feet without a sitting break to improve community ambulation. Baseline:  Goal status: PARTIALLY MET  PLAN:  PT FREQUENCY: 2x/week  PT DURATION: 8 weeks  PLANNED INTERVENTIONS: 97164- PT Re-evaluation, 97110-Therapeutic exercises, 97530- Therapeutic activity, [id number]- Neuromuscular re-education, 97535- Self Care, [id number]- Manual therapy, [phone]- Gait training, [phone]- Aquatic Therapy, Patient/Family education, Balance training, Stair training, Joint mobilization, DME instructions, Cryotherapy, and Moist heat  PLAN FOR NEXT SESSION: work on LE strength, sit to stand, gait endurance.  Madell Heino SHAUNNA Baptist, PT 01/07/24 11:08 AM

## 2024-01-09 ENCOUNTER — Ambulatory Visit: Admitting: Rehabilitative and Restorative Service Providers"

## 2024-01-09 ENCOUNTER — Encounter: Payer: Self-pay | Admitting: Rehabilitative and Restorative Service Providers"

## 2024-01-09 DIAGNOSIS — M25561 Pain in right knee: Secondary | ICD-10-CM | POA: Diagnosis not present

## 2024-01-09 DIAGNOSIS — R2689 Other abnormalities of gait and mobility: Secondary | ICD-10-CM | POA: Diagnosis not present

## 2024-01-09 DIAGNOSIS — M6281 Muscle weakness (generalized): Secondary | ICD-10-CM | POA: Diagnosis not present

## 2024-01-09 DIAGNOSIS — M5459 Other low back pain: Secondary | ICD-10-CM | POA: Diagnosis not present

## 2024-01-09 DIAGNOSIS — G8929 Other chronic pain: Secondary | ICD-10-CM

## 2024-01-09 DIAGNOSIS — M25562 Pain in left knee: Secondary | ICD-10-CM | POA: Diagnosis not present

## 2024-01-09 NOTE — Therapy (Signed)
 OUTPATIENT PHYSICAL THERAPY LOWER EXTREMITY TREATMENT   Patient Name: Natalie Wright MRN: [id number] DOB:1950/12/12, 73 y.o., female Today's Date: 01/09/2024  Physical Therapy Progress Note   Dates of Reporting Period:11/19/23-01/01/24  Thank you for the referral of this patient.   END OF SESSION:  PT End of Session - 01/09/24 1025     Visit Number 12    Number of Visits 17    Date for PT Re-Evaluation 02/14/24    Authorization Type medicare and tricare    Authorization - Visit Number 35    Progress Note Due on Visit 20    PT Start Time 1020    PT Stop Time 1100    PT Time Calculation (min) 40 min    Activity Tolerance Patient tolerated treatment well          Past Medical History:  Diagnosis Date   Allergy    to cats and dogs   Anemia    Arthritis    Cataract    Diabetes mellitus    GERD (gastroesophageal reflux disease)    Glaucoma    Hiatal hernia    and delayed gastric emptying/ sees [facility] for recurrent diarrhea   History of cardiovascular disorder 12/10/2008   Qualifier: Diagnosis of  By: Alvan MD, Catherine     Hypertension    OSA (obstructive sleep apnea)    CPAP   Pedal edema    Sleep apnea    Spastic bladder    urology in [place]   TIA (transient ischemic attack)    hx of- sees Dr Robin (Neurology)   Past Surgical History:  Procedure Laterality Date   CATARACT EXTRACTION  08/2021   IR ANGIOGRAM SELECTIVE EACH ADDITIONAL VESSEL  04/19/2023   IR ANGIOGRAM SELECTIVE EACH ADDITIONAL VESSEL  04/19/2023   IR ANGIOGRAM SELECTIVE EACH ADDITIONAL VESSEL  04/19/2023   IR EMBO ARTERIAL NOT HEMORR HEMANG INC GUIDE ROADMAPPING  04/19/2023   IR EMBO ARTERIAL NOT HEMORR HEMANG INC GUIDE ROADMAPPING  06/07/2023   IR RADIOLOGIST EVAL & MGMT  03/08/2023   IR RADIOLOGIST EVAL & MGMT  05/20/2023   IR RADIOLOGIST EVAL & MGMT  07/19/2023   IR US  GUIDE VASC ACCESS RIGHT  04/19/2023   TUBAL LIGATION     vaginal skin tag removal     Patient Active Problem  List   Diagnosis Date Noted   BMI 50.0-59.9, adult (HCC) 06/04/2023   Diabetic retinopathy (HCC) 02/26/2023   Lumbar spondylosis 07/03/2022   Lumbar spinal stenosis 02/26/2022   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/2022   Bilateral hip pain 01/12/2022   Hearing loss due to cerumen impaction, right 01/12/2022   Hammertoe, bilateral 08/23/2021   History of foot ulcer 08/23/2021   Primary osteoarthritis of both knees 12/09/2020   History of burning pain in leg 08/29/2020   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   Tremor 05/05/2020   Pre-ulcerative corn or callous 01/29/2020   History of transient ischemic attack (TIA) 05/15/2018   Family history of ovarian cancer 04/02/2017   Aortic atherosclerosis (HCC) 08/31/2016   Venous stasis 08/10/2016   Palpitations 07/16/2016   Atrial dilatation, left 07/16/2016   Atrial septal aneurysm 07/16/2016   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   Cataract 04/18/2012   Hyperlipidemia 02/13/2011   Pulmonary nodules 09/22/2010   LEG PAIN 09/12/2009   Sleep apnea 04/13/2009   GERD 03/14/2009   Morbid obesity (HCC) 12/10/2008   Essential hypertension, benign 12/10/2008   RAYNAUD'S DISEASE 12/10/2008   Osteoarthritis  12/10/2008   URINARY INCONTINENCE 12/10/2008    PCP: Alvan Dorothyann BIRCH, MD REFERRING PROVIDER: Curtis Debby PARAS,* REFERRING DIAG: M17.0 (ICD-10-CM) - Primary osteoarthritis of both kneesM47.816 (ICD-10-CM) - Lumbar spondylosisR29.898 (ICD-10-CM) - Weakness of both lower extremities  THERAPY DIAG:  Chronic pain of right knee  Chronic pain of left knee  Other abnormalities of gait and mobility  Muscle weakness (generalized)  Other low back pain  Rationale for Evaluation and Treatment: Rehabilitation  ONSET DATE: lastest episode   SUBJECTIVE:   SUBJECTIVE STATEMENT: Patient reports that she feels tired today. Not sure of anything she did differently. She has some soreness in the hips  and thighs today. Feels the things we did at last visit have some benefit. Now weighs in the 250's weighed 392 at highest weight. Her blood monitor has been recalled so she has not been able to check her blood sugar level. Symptoms in back and knee vary. She bought a donut to sit on and that helps the feeling in her back when sitting. Waiting on rollator which is on order.    PERTINENT HISTORY: Diabetes, HTN, TIA, spinal stenosis, polymyalgia rheumatica   PAIN:  Are you having pain? Yes: NPRS scale: 4/10  Pain location: bilateral knees  Pain description: sore Aggravating factors: Raising B legs hurts the knees, trying to move after standing, pivoting to step  Relieving factors: nothing  PRECAUTIONS: Fall  WEIGHT BEARING RESTRICTIONS: No  FALLS:  Has patient fallen in last 6 months? No  LIVING ENVIRONMENT: Lives with: lives alone spouse passed away 06-07-2022 Lives in: House/apartment Stairs: No Has following equipment at home: Quad cane large base and shower bench, grab bars, scooter (uses husband's scooter)  PLOF: declining mobility  PATIENT GOALS: continue to get stronger, more mobility and less comfort  NEXT MD VISIT: 01/14/24  OBJECTIVE:  Note: Objective measures were completed at Evaluation unless otherwise noted.  DIAGNOSTIC FINDINGS: none  PATIENT SURVEYS:  LEFS 29 / 80 = 36.3 % 01/08/14: 32/80; 40%    SENSATION: Notes neuropathy with some burning in feet, dec'd sensation  EDEMA:  Bilateral swelling noted in LEs    LOWER EXTREMITY ROM: Active ROM Right eval Left eval  Knee flexion 108 103  Knee extension 0 0   (Blank rows = not tested)  LOWER EXTREMITY MMT: MMT Right eval Left eval Right  01/09/24 Left  01/09/24  Hip flexion 4+ 4+ 5- 5-  Hip extension 5 4+    Hip abduction 4+ 4+ 5- 5-  Hip adduction      Hip internal rotation      Hip external rotation      Knee flexion 5 5    Knee extension 5 5    Ankle dorsiflexion 5 5    Ankle plantarflexion       Ankle inversion      Ankle eversion       (Blank rows = not tested)  FUNCTIONAL TESTS:  5 times sit to stand: 21 sec 2 minute walk test: 1 min 20 sec  98 ft 4/10 pain (pain went down from 6/10) Gait speed: 33 ft 29.88 seconds =  1.10 ft/sec  01/09/24: 5 times sit to stand = 26.5 sec   GAIT: Distance walked: 80 feet  Assistive device utilized: Walker - 2 wheeled Level of assistance: Modified independence   Independent with walker Comments: forward trunk lean  [facility] Adult PT Treatment:  DATE: 01/09/24 Therapeutic Exercise: Standing  Seated Shoulder flexion reaching overhead x 5 x 2  Row red TB 10 x 2  Shoulder ER bilat red TB x 10 x 2  LAQ sitting  Therapeutic Activity: Sit<>stand 5 reps x 2 sets Sit<>stand x 5 reps x 2 with overhead reaching to engage core Sit<>stand x 5 holding 5# KB at waist  Standing working on upright posture and alignment without assistive device to patient tolerance - 3 trials standing from ~ 20-60 seconds  Bridge 5 sec x 5 x 2 unable to clear hips from surface  Gait: 80 ft nonstop with RW x 3 VC for upright posture   [facility] Adult PT Treatment:                                                DATE: 01/07/24 Therapeutic Exercise: Standing Heel raises x 10 reps Trunk flexion/extension x 10 reps Hip abduction x 10 reps Seated Shoulder flexion reaching overhead x 5 x 2  Row red TB 10 x 2  Shoulder ER bilat red TB x 10 x 2  LAQ sitting  Therapeutic Activity: Sit<>stand 5 reps x 2 sets Sit<>stand x 5 reps x 2 with overhead reaching to engage core Sit<>stand x 5 holding 5# KB at waist  Standing working on upright posture and alignment without assistive device to patient tolerance - 8 trials standing from ~ 20-60 seconds  Gait: 80 ft nonstop with RW x 3 VC for upright posture   [facility] Adult PT Treatment:                                                DATE: 01/01/24 Therapeutic Exercise: Standing Heel raises  x 10 reps Trunk flexion/extension x 10 reps Hip abduction x 10 reps Seated LAQ x 10 with green acull band  HS cur x 10 with green acull band Therapeutic Activity: Sit<>stand 5 reps x 2 sets Sit<>stand x 5 reps with overhead reaching to engage core Gait: 170 ft nonstop with RW mod indep 110 ft nonstop with RW mod indep 80 ft nonstop with RW mod indep Patient gets increased back pain when attempting to stand upright   PATIENT EDUCATION:  Education details: HEP review Person educated: Patient Education method: Explanation Education comprehension: verbalized understanding  HOME EXERCISE PROGRAM: Access Code: [email or web address] URL: [email or web address] Date: 12/03/2023 Prepared by: Lucie Meeter  Exercises - Supine Active Straight Leg Raise  - 1 x daily - 7 x weekly - 1 sets - 10 reps - Supine Bridge  - 2 x daily - 4 x weekly - 1 sets - 10 reps - Heel Toe Raises with Counter Support  - 2 x daily - 7 x weekly - 1 sets - 10 reps - Seated March  - 2 x daily - 7 x weekly - 1 sets - 10 reps - Sidelying hip Flexion and ABDuction  - 1 x daily - 3 x weekly - 2 sets - 10 reps  Patient Education - walking program   ASSESSMENT:  CLINICAL IMPRESSION: Patient demonstrates improvement in LE strength. She has met 2 STGs. She has improved walking from tolerating 98 ft nonstop to 170 ft nonstop today. She continues to have limitations with gait  and trunk strength leaning on her elbows at times with walking; she demonstrates improved upright posture with walking with VC of PT. She has continued limited tolerance for walking distances. Patient will benefit from continue treatment to progress strengthening, gait training, balance, and endurance needed for functional mobility Continued with strengthening in functional activities and exercises. Working on sit to stand variations; TB strengthening for posterior shoulder girdle in sitting; standing with upright posture and alignment. Patient  fatigues easily with functional tasks requiring periods of rest between activities. Skilled care is indicated to continue to progress strengthening, gait training, balance, and endurance needed for functional mobility.     Eval: Patient is a 73 y.o. female who was seen today for physical therapy evaluation and treatment for weakness in B LE and chronic B knee and back pain. She was last seen in this clinic in February. She reports some improvements since that time. She is able to get into her car easier and she is having less popping in her knees. She is working to lose weight in order to get TKR surgery, however, her MD is concerned that she is losing muscle mass. She is limited with ambulation with her RW to 1 min and 20 sec. During that time she was able to walk 98 ft. Her LEFS 29/80. She also demonstrates B LE weakness. She will benefit from skilled PT to address these deficits.    OBJECTIVE IMPAIRMENTS: decreased activity tolerance, decreased mobility, decreased strength, postural dysfunction, obesity, and pain.   GOALS: Goals reviewed with patient? Yes SHORT TERM GOALS: Target date: 12/17/23 Ind with initial HEP Baseline: Goal status: MET  2.  Able to complete 2 min walk test showing improved strength. Baseline: 1 min 20 sec/ 98 ft 12/03/23: 2.5 minutes, 2 laps in clinic  (160 ft) Goal status: MET  LONG TERM GOALS: Target date: 02/14/24  Patient to demonstrate improved gait speed to >=1.6 ft/sec to demo improved community ambulation status Baseline: 1.10 ft/sec 11/21/23 Goal status: ON GOING   2.  Pt able to perform 5XSTS in < = to 16 seconds demonstrating improved strength Baseline: 21 sec ; 01/01/24: 21.2 seconds; 01/09/24 26.5 sec  Goal status: IN PROGRESS  3.  Patient able to ambulate 300 feet without a sitting break to improve community ambulation. Baseline:  Goal status: ON GOING   PLAN:  PT FREQUENCY: 2x/week  PT DURATION: 8 weeks  PLANNED INTERVENTIONS: 97164- PT  Re-evaluation, 97110-Therapeutic exercises, 97530- Therapeutic activity, [id number]- Neuromuscular re-education, 97535- Self Care, 02859- Manual therapy, [phone]- Gait training, [phone]- Aquatic Therapy, Patient/Family education, Balance training, Stair training, Joint mobilization, DME instructions, Cryotherapy, and Moist heat  PLAN FOR NEXT SESSION: work on LE strength, sit to stand, gait endurance.  Dionis Autry P Manasvi Dickard, PT 01/09/24 11:09 AM

## 2024-01-13 ENCOUNTER — Ambulatory Visit

## 2024-01-13 DIAGNOSIS — M25561 Pain in right knee: Secondary | ICD-10-CM | POA: Diagnosis not present

## 2024-01-13 DIAGNOSIS — M6281 Muscle weakness (generalized): Secondary | ICD-10-CM | POA: Diagnosis not present

## 2024-01-13 DIAGNOSIS — G8929 Other chronic pain: Secondary | ICD-10-CM | POA: Diagnosis not present

## 2024-01-13 DIAGNOSIS — R2689 Other abnormalities of gait and mobility: Secondary | ICD-10-CM | POA: Diagnosis not present

## 2024-01-13 DIAGNOSIS — M5459 Other low back pain: Secondary | ICD-10-CM | POA: Diagnosis not present

## 2024-01-13 DIAGNOSIS — M25562 Pain in left knee: Secondary | ICD-10-CM | POA: Diagnosis not present

## 2024-01-13 NOTE — Therapy (Signed)
 OUTPATIENT PHYSICAL THERAPY LOWER EXTREMITY TREATMENT   Patient Name: Natalie Wright MRN: [id number] DOB:[birth year], 73 y.o., female Today's Date: 01/13/2024   END OF SESSION:  PT End of Session - 01/13/24 1104     Visit Number 13    Number of Visits 17    Date for PT Re-Evaluation 02/14/24    Authorization Type medicare and tricare    Progress Note Due on Visit 20    PT Start Time 1105    PT Stop Time 1148    PT Time Calculation (min) 43 min    Activity Tolerance Patient tolerated treatment well    Behavior During Therapy WFL for tasks assessed/performed         Past Medical History:  Diagnosis Date   Allergy    to cats and dogs   Anemia    Arthritis    Cataract    Diabetes mellitus    GERD (gastroesophageal reflux disease)    Glaucoma    Hiatal hernia    and delayed gastric emptying/ sees [facility] for recurrent diarrhea   History of cardiovascular disorder 12/10/2008   Qualifier: Diagnosis of  By: Alvan MD, Catherine     Hypertension    OSA (obstructive sleep apnea)    CPAP   Pedal edema    Sleep apnea    Spastic bladder    urology in [place]   TIA (transient ischemic attack)    hx of- sees Dr Robin (Neurology)   Past Surgical History:  Procedure Laterality Date   CATARACT EXTRACTION  08/2021   IR ANGIOGRAM SELECTIVE EACH ADDITIONAL VESSEL  04/19/2023   IR ANGIOGRAM SELECTIVE EACH ADDITIONAL VESSEL  04/19/2023   IR ANGIOGRAM SELECTIVE EACH ADDITIONAL VESSEL  04/19/2023   IR EMBO ARTERIAL NOT HEMORR HEMANG INC GUIDE ROADMAPPING  04/19/2023   IR EMBO ARTERIAL NOT HEMORR HEMANG INC GUIDE ROADMAPPING  06/07/2023   IR RADIOLOGIST EVAL & MGMT  03/08/2023   IR RADIOLOGIST EVAL & MGMT  05/20/2023   IR RADIOLOGIST EVAL & MGMT  07/19/2023   IR US  GUIDE VASC ACCESS RIGHT  04/19/2023   TUBAL LIGATION     vaginal skin tag removal     Patient Active Problem List   Diagnosis Date Noted   BMI 50.0-59.9, adult (HCC) 06/04/2023   Diabetic retinopathy (HCC)  02/26/2023   Lumbar spondylosis 07/03/2022   Lumbar spinal stenosis 02/26/2022   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/2022   Bilateral hip pain 01/12/2022   Hearing loss due to cerumen impaction, right 01/12/2022   Hammertoe, bilateral 08/23/2021   History of foot ulcer 08/23/2021   Primary osteoarthritis of both knees 12/09/2020   History of burning pain in leg 08/29/2020   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   Tremor 05/05/2020   Pre-ulcerative corn or callous 01/29/2020   History of transient ischemic attack (TIA) 05/15/2018   Family history of ovarian cancer 04/02/2017   Aortic atherosclerosis (HCC) 08/31/2016   Venous stasis 08/10/2016   Palpitations 07/16/2016   Atrial dilatation, left 07/16/2016   Atrial septal aneurysm 07/16/2016   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   Cataract 04/18/2012   Hyperlipidemia 02/13/2011   Pulmonary nodules 09/22/2010   LEG PAIN 09/12/2009   Sleep apnea 04/13/2009   GERD 03/14/2009   Morbid obesity (HCC) 12/10/2008   Essential hypertension, benign 12/10/2008   RAYNAUD'S DISEASE 12/10/2008   Osteoarthritis 12/10/2008   URINARY INCONTINENCE 12/10/2008    PCP: Alvan Dorothyann BIRCH, MD REFERRING PROVIDER: Curtis Debby PARAS,*  REFERRING DIAG: M17.0 (ICD-10-CM) - Primary osteoarthritis of both kneesM47.816 (ICD-10-CM) - Lumbar spondylosisR29.898 (ICD-10-CM) - Weakness of both lower extremities  THERAPY DIAG:  Chronic pain of left knee  Other abnormalities of gait and mobility  Chronic pain of right knee  Muscle weakness (generalized)  Other low back pain  Rationale for Evaluation and Treatment: Rehabilitation  ONSET DATE: lastest episode   SUBJECTIVE:   SUBJECTIVE STATEMENT: Patient reports at last visit movement wise it felt better but I had a little bit of different discomfort in low back. Patient states so far she is feeling okay today.    PERTINENT HISTORY: Diabetes, HTN, TIA,  spinal stenosis, polymyalgia rheumatica   PAIN:  Are you having pain? Yes: NPRS scale: 4/10  Pain location: bilateral knees  Pain description: sore Aggravating factors: Raising B legs hurts the knees, trying to move after standing, pivoting to step  Relieving factors: nothing  PRECAUTIONS: Fall  WEIGHT BEARING RESTRICTIONS: No  FALLS:  Has patient fallen in last 6 months? No  LIVING ENVIRONMENT: Lives with: lives alone spouse passed away 2022/06/17 Lives in: House/apartment Stairs: No Has following equipment at home: Quad cane large base and shower bench, grab bars, scooter (uses husband's scooter)  PLOF: declining mobility  PATIENT GOALS: continue to get stronger, more mobility and less comfort  NEXT MD VISIT: 01/14/24  OBJECTIVE:  Note: Objective measures were completed at Evaluation unless otherwise noted.  DIAGNOSTIC FINDINGS: none  PATIENT SURVEYS:  LEFS 29 / 80 = 36.3 % 01/08/14: 32/80; 40%    SENSATION: Notes neuropathy with some burning in feet, dec'd sensation  EDEMA:  Bilateral swelling noted in LEs    LOWER EXTREMITY ROM: Active ROM Right eval Left eval  Knee flexion 108 103  Knee extension 0 0   (Blank rows = not tested)  LOWER EXTREMITY MMT: MMT Right eval Left eval Right  01/09/24 Left  01/09/24  Hip flexion 4+ 4+ 5- 5-  Hip extension 5 4+    Hip abduction 4+ 4+ 5- 5-  Hip adduction      Hip internal rotation      Hip external rotation      Knee flexion 5 5    Knee extension 5 5    Ankle dorsiflexion 5 5    Ankle plantarflexion      Ankle inversion      Ankle eversion       (Blank rows = not tested)  FUNCTIONAL TESTS:  5 times sit to stand: 21 sec 2 minute walk test: 1 min 20 sec  98 ft 4/10 pain (pain went down from 6/10) Gait speed: 33 ft 29.88 seconds =  1.10 ft/sec  01/09/24: 5 times sit to stand = 26.5 sec   GAIT: Distance walked: 80 feet  Assistive device utilized: Environmental consultant - 2 wheeled Level of assistance: Modified  independence   Independent with walker Comments: forward trunk lean   [facility] Adult PT Treatment:                                                DATE: 01/13/2024 Therapeutic Exercise: Standing stretch rolling green bolster up the wall LTR x 2 min Bridges 2x10  Supine HS/ITB/ADD stretches with strap x30 each (bil) Neuromuscular Re-eduction: Standing + overhead press with 5#KB  2 x 5 Isometric knee extension with orange PB x10  Isometric knee  flexion with green bolster x10 Therapeutic Activity: STS x 5  Side bend reach in standing (alternating) STS + cradling 5# KB  x 5    [facility] Adult PT Treatment:                                                DATE: 01/09/24 Therapeutic Exercise: Standing  Seated Shoulder flexion reaching overhead x 5 x 2  Row red TB 10 x 2  Shoulder ER bilat red TB x 10 x 2  LAQ sitting  Therapeutic Activity: Sit<>stand 5 reps x 2 sets Sit<>stand x 5 reps x 2 with overhead reaching to engage core Sit<>stand x 5 holding 5# KB at waist  Standing working on upright posture and alignment without assistive device to patient tolerance - 3 trials standing from ~ 20-60 seconds  Bridge 5 sec x 5 x 2 unable to clear hips from surface  Gait: 80 ft nonstop with RW x 3 VC for upright posture    [facility] Adult PT Treatment:                                                DATE: 01/07/24 Therapeutic Exercise: Standing Heel raises x 10 reps Trunk flexion/extension x 10 reps Hip abduction x 10 reps Seated Shoulder flexion reaching overhead x 5 x 2  Row red TB 10 x 2  Shoulder ER bilat red TB x 10 x 2  LAQ sitting  Therapeutic Activity: Sit<>stand 5 reps x 2 sets Sit<>stand x 5 reps x 2 with overhead reaching to engage core Sit<>stand x 5 holding 5# KB at waist  Standing working on upright posture and alignment without assistive device to patient tolerance - 8 trials standing from ~ 20-60 seconds  Gait: 80 ft nonstop with RW x 3 VC for upright posture    PATIENT  EDUCATION:  Education details: HEP review Person educated: Patient Education method: Explanation Education comprehension: verbalized understanding  HOME EXERCISE PROGRAM: Access Code: [email or web address] URL: [email or web address] Date: 12/03/2023 Prepared by: Lucie Meeter  Exercises - Supine Active Straight Leg Raise  - 1 x daily - 7 x weekly - 1 sets - 10 reps - Supine Bridge  - 2 x daily - 4 x weekly - 1 sets - 10 reps - Heel Toe Raises with Counter Support  - 2 x daily - 7 x weekly - 1 sets - 10 reps - Seated March  - 2 x daily - 7 x weekly - 1 sets - 10 reps - Sidelying hip Flexion and ABDuction  - 1 x daily - 3 x weekly - 2 sets - 10 reps  Patient Education - walking program   ASSESSMENT:  CLINICAL IMPRESSION: Sit to stand variations continued, adding side bend overhead reaching. Patient reports most fatigue with reaching overhead activities and standing core activation exercises. Patient able to ambulate 160' around clinic before requesting seated rest break due to fatigue. Patient will continue to benefit from skilled therapy to progress functional strength and endurance.   Eval: Patient is a 73 y.o. female who was seen today for physical therapy evaluation and treatment for weakness in B LE and chronic B knee and back pain. She was last seen in this clinic in  February. She reports some improvements since that time. She is able to get into her car easier and she is having less popping in her knees. She is working to lose weight in order to get TKR surgery, however, her MD is concerned that she is losing muscle mass. She is limited with ambulation with her RW to 1 min and 20 sec. During that time she was able to walk 98 ft. Her LEFS 29/80. She also demonstrates B LE weakness. She will benefit from skilled PT to address these deficits.    OBJECTIVE IMPAIRMENTS: decreased activity tolerance, decreased mobility, decreased strength, postural dysfunction, obesity, and pain.    GOALS: Goals reviewed with patient? Yes  SHORT TERM GOALS: Target date: 12/17/23 Ind with initial HEP Baseline: Goal status: MET  2.  Able to complete 2 min walk test showing improved strength. Baseline: 1 min 20 sec/ 98 ft 12/03/23: 2.5 minutes, 2 laps in clinic  (160 ft) Goal status: MET  LONG TERM GOALS: Target date: 02/14/24  Patient to demonstrate improved gait speed to >=1.6 ft/sec to demo improved community ambulation status Baseline: 1.10 ft/sec 11/21/23 Goal status: ON GOING   2.  Pt able to perform 5XSTS in < = to 16 seconds demonstrating improved strength Baseline: 21 sec  01/01/24: 21.2 seconds  01/09/24 26.5 sec  Goal status: IN PROGRESS  3.  Patient able to ambulate 300 feet without a sitting break to improve community ambulation. Baseline:  01/13/24:  Goal status: ON GOING   PLAN:  PT FREQUENCY: 2x/week  PT DURATION: 8 weeks  PLANNED INTERVENTIONS: 97164- PT Re-evaluation, 97110-Therapeutic exercises, 97530- Therapeutic activity, 97112- Neuromuscular re-education, 97535- Self Care, [id number]- Manual therapy, [phone]- Gait training, [phone]- Aquatic Therapy, Patient/Family education, Balance training, Stair training, Joint mobilization, DME instructions, Cryotherapy, and Moist heat  PLAN FOR NEXT SESSION: work on LE strength, sit to stand, gait endurance.  Lamarr GORMAN Price, PTA 01/13/24 11:49 AM

## 2024-01-14 ENCOUNTER — Ambulatory Visit: Admitting: Family Medicine

## 2024-01-16 ENCOUNTER — Ambulatory Visit: Admitting: Physical Therapy

## 2024-01-16 ENCOUNTER — Encounter: Payer: Self-pay | Admitting: Physical Therapy

## 2024-01-16 DIAGNOSIS — R2689 Other abnormalities of gait and mobility: Secondary | ICD-10-CM | POA: Diagnosis not present

## 2024-01-16 DIAGNOSIS — M5459 Other low back pain: Secondary | ICD-10-CM

## 2024-01-16 DIAGNOSIS — M6281 Muscle weakness (generalized): Secondary | ICD-10-CM | POA: Diagnosis not present

## 2024-01-16 DIAGNOSIS — M25561 Pain in right knee: Secondary | ICD-10-CM | POA: Diagnosis not present

## 2024-01-16 DIAGNOSIS — L811 Chloasma: Secondary | ICD-10-CM | POA: Diagnosis not present

## 2024-01-16 DIAGNOSIS — G8929 Other chronic pain: Secondary | ICD-10-CM | POA: Diagnosis not present

## 2024-01-16 DIAGNOSIS — L649 Androgenic alopecia, unspecified: Secondary | ICD-10-CM | POA: Diagnosis not present

## 2024-01-16 DIAGNOSIS — M25562 Pain in left knee: Secondary | ICD-10-CM | POA: Diagnosis not present

## 2024-01-16 NOTE — Therapy (Signed)
 OUTPATIENT PHYSICAL THERAPY LOWER EXTREMITY TREATMENT   Patient Name: Natalie Wright MRN: [id number] DOB:16-Nov-1950, 73 y.o., female Today's Date: 01/16/2024   END OF SESSION:  PT End of Session - 01/16/24 1232     Visit Number 14    Number of Visits 17    Date for PT Re-Evaluation 02/14/24    Authorization Type medicare and tricare    Authorization - Visit Number 36    Progress Note Due on Visit 20    PT Start Time 1153    PT Stop Time 1233    PT Time Calculation (min) 40 min    Activity Tolerance Patient tolerated treatment well;Patient limited by pain    Behavior During Therapy WFL for tasks assessed/performed          Past Medical History:  Diagnosis Date   Allergy    to cats and dogs   Anemia    Arthritis    Cataract    Diabetes mellitus    GERD (gastroesophageal reflux disease)    Glaucoma    Hiatal hernia    and delayed gastric emptying/ sees [facility] for recurrent diarrhea   History of cardiovascular disorder 12/10/2008   Qualifier: Diagnosis of  By: Alvan MD, Catherine     Hypertension    OSA (obstructive sleep apnea)    CPAP   Pedal edema    Sleep apnea    Spastic bladder    urology in [place]   TIA (transient ischemic attack)    hx of- sees Dr Robin (Neurology)   Past Surgical History:  Procedure Laterality Date   CATARACT EXTRACTION  08/2021   IR ANGIOGRAM SELECTIVE EACH ADDITIONAL VESSEL  04/19/2023   IR ANGIOGRAM SELECTIVE EACH ADDITIONAL VESSEL  04/19/2023   IR ANGIOGRAM SELECTIVE EACH ADDITIONAL VESSEL  04/19/2023   IR EMBO ARTERIAL NOT HEMORR HEMANG INC GUIDE ROADMAPPING  04/19/2023   IR EMBO ARTERIAL NOT HEMORR HEMANG INC GUIDE ROADMAPPING  06/07/2023   IR RADIOLOGIST EVAL & MGMT  03/08/2023   IR RADIOLOGIST EVAL & MGMT  05/20/2023   IR RADIOLOGIST EVAL & MGMT  07/19/2023   IR US  GUIDE VASC ACCESS RIGHT  04/19/2023   TUBAL LIGATION     vaginal skin tag removal     Patient Active Problem List   Diagnosis Date Noted   BMI  50.0-59.9, adult (HCC) 06/04/2023   Diabetic retinopathy (HCC) 02/26/2023   Lumbar spondylosis 07/03/2022   Lumbar spinal stenosis 02/26/2022   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/2022   Bilateral hip pain 01/12/2022   Hearing loss due to cerumen impaction, right 01/12/2022   Hammertoe, bilateral 08/23/2021   History of foot ulcer 08/23/2021   Primary osteoarthritis of both knees 12/09/2020   History of burning pain in leg 08/29/2020   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   Tremor 05/05/2020   Pre-ulcerative corn or callous 01/29/2020   History of transient ischemic attack (TIA) 05/15/2018   Family history of ovarian cancer 04/02/2017   Aortic atherosclerosis (HCC) 08/31/2016   Venous stasis 08/10/2016   Palpitations 07/16/2016   Atrial dilatation, left 07/16/2016   Atrial septal aneurysm 07/16/2016   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   Cataract 04/18/2012   Hyperlipidemia 02/13/2011   Pulmonary nodules 09/22/2010   LEG PAIN 09/12/2009   Sleep apnea 04/13/2009   GERD 03/14/2009   Morbid obesity (HCC) 12/10/2008   Essential hypertension, benign 12/10/2008   RAYNAUD'S DISEASE 12/10/2008   Osteoarthritis 12/10/2008   URINARY INCONTINENCE 12/10/2008  PCP: Alvan Dorothyann BIRCH, MD REFERRING PROVIDER: Curtis Debby PARAS,* REFERRING DIAG: M17.0 (ICD-10-CM) - Primary osteoarthritis of both kneesM47.816 (ICD-10-CM) - Lumbar spondylosisR29.898 (ICD-10-CM) - Weakness of both lower extremities  THERAPY DIAG:  Chronic pain of left knee  Other abnormalities of gait and mobility  Chronic pain of right knee  Muscle weakness (generalized)  Other low back pain  Rationale for Evaluation and Treatment: Rehabilitation  ONSET DATE: lastest episode   SUBJECTIVE:   SUBJECTIVE STATEMENT: Low back bothering me a little bit today. I may need another shot. I think the knees pop less and shift less.    PERTINENT HISTORY: Diabetes, HTN, TIA,  spinal stenosis, polymyalgia rheumatica   PAIN:  Are you having pain? Yes: NPRS scale: 4/10  Pain location: bilateral knees  Pain description: sore Aggravating factors: Raising B legs hurts the knees, trying to move after standing, pivoting to step  Relieving factors: nothing  PRECAUTIONS: Fall  WEIGHT BEARING RESTRICTIONS: No  FALLS:  Has patient fallen in last 6 months? No  LIVING ENVIRONMENT: Lives with: lives alone spouse passed away 06-05-2022 Lives in: House/apartment Stairs: No Has following equipment at home: Quad cane large base and shower bench, grab bars, scooter (uses husband's scooter)  PLOF: declining mobility  PATIENT GOALS: continue to get stronger, more mobility and less comfort  NEXT MD VISIT: 01/14/24  OBJECTIVE:  Note: Objective measures were completed at Evaluation unless otherwise noted.  DIAGNOSTIC FINDINGS: none  PATIENT SURVEYS:  LEFS 29 / 80 = 36.3 % 01/08/14: 32/80; 40%    SENSATION: Notes neuropathy with some burning in feet, dec'd sensation  EDEMA:  Bilateral swelling noted in LEs    LOWER EXTREMITY ROM: Active ROM Right eval Left eval  Knee flexion 108 103  Knee extension 0 0   (Blank rows = not tested)  LOWER EXTREMITY MMT: MMT Right eval Left eval Right  01/09/24 Left  01/09/24  Hip flexion 4+ 4+ 5- 5-  Hip extension 5 4+    Hip abduction 4+ 4+ 5- 5-  Hip adduction      Hip internal rotation      Hip external rotation      Knee flexion 5 5    Knee extension 5 5    Ankle dorsiflexion 5 5    Ankle plantarflexion      Ankle inversion      Ankle eversion       (Blank rows = not tested)  FUNCTIONAL TESTS:  5 times sit to stand: 21 sec 2 minute walk test: 1 min 20 sec  98 ft 4/10 pain (pain went down from 6/10) Gait speed: 33 ft 29.88 seconds =  1.10 ft/sec  01/09/24: 5 times sit to stand = 26.5 sec   GAIT: Distance walked: 80 feet  Assistive device utilized: Environmental consultant - 2 wheeled Level of assistance: Modified  independence   Independent with walker Comments: forward trunk lean  [facility] Adult PT Treatment:                                                DATE: 01/16/2024 Therapeutic Exercise:  Seated Shoulder flexion reaching overhead  with ball x 10, chops B x 10 with ball 3 way ball roll outs on green plyo ball (also did later after sit to stand exercises Pelvic tilts  Row red TB 10 x 2  Shoulder  ER bilat red TB x 10 x 2   Therapeutic Activity: Sit<>stand 10 reps  Attempted OH reaching with sit to stands - not tolerated today Seated trunk ext with purple powerband x 10 Sit<>stand 2 x  5 holding 5# KB at waist Standing working on upright posture and alignment without assistive device to patient tolerance - 2 trials standing from ~ 20-60 seconds limited by hip pain Standing marching in walker x 5 B - decreases pain  Gait: 80 ft nonstop with RW x 1 VC for upright posture - had to stop due to pain   [facility] Adult PT Treatment:                                                DATE: 01/13/2024 Therapeutic Exercise: Standing stretch rolling green bolster up the wall LTR x 2 min Bridges 2x10  Supine HS/ITB/ADD stretches with strap x30 each (bil) Neuromuscular Re-eduction: Standing + overhead press with 5#KB  2 x 5 Isometric knee extension with orange PB x10  Isometric knee flexion with green bolster x10 Therapeutic Activity: STS x 5  Side bend reach in standing (alternating) STS + cradling 5# KB  x 5    [facility] Adult PT Treatment:                                                DATE: 01/09/24 Therapeutic Exercise: Standing  Seated Shoulder flexion reaching overhead x 5 x 2  Row red TB 10 x 2  Shoulder ER bilat red TB x 10 x 2  LAQ sitting  Therapeutic Activity: Sit<>stand 5 reps x 2 sets Sit<>stand x 5 reps x 2 with overhead reaching to engage core Sit<>stand x 5 holding 5# KB at waist  Standing working on upright posture and alignment without assistive device to patient tolerance - 3  trials standing from ~ 20-60 seconds  Bridge 5 sec x 5 x 2 unable to clear hips from surface  Gait: 80 ft nonstop with RW x 3 VC for upright posture    [facility] Adult PT Treatment:                                                DATE: 01/07/24 Therapeutic Exercise: Standing Heel raises x 10 reps Trunk flexion/extension x 10 reps Hip abduction x 10 reps Seated Shoulder flexion reaching overhead x 5 x 2  Row red TB 10 x 2  Shoulder ER bilat red TB x 10 x 2  LAQ sitting  Therapeutic Activity: Sit<>stand 5 reps x 2 sets Sit<>stand x 5 reps x 2 with overhead reaching to engage core Sit<>stand x 5 holding 5# KB at waist  Standing working on upright posture and alignment without assistive device to patient tolerance - 8 trials standing from ~ 20-60 seconds  Gait: 80 ft nonstop with RW x 1 VC for upright posture    PATIENT EDUCATION:  Education details: HEP review Person educated: Patient Education method: Explanation Education comprehension: verbalized understanding  HOME EXERCISE PROGRAM: Access Code: [email or web address] URL: [email or web address] Date: 12/03/2023 Prepared by: Lucie Meeter  Exercises -  Supine Active Straight Leg Raise  - 1 x daily - 7 x weekly - 1 sets - 10 reps - Supine Bridge  - 2 x daily - 4 x weekly - 1 sets - 10 reps - Heel Toe Raises with Counter Support  - 2 x daily - 7 x weekly - 1 sets - 10 reps - Seated March  - 2 x daily - 7 x weekly - 1 sets - 10 reps - Sidelying hip Flexion and ABDuction  - 1 x daily - 3 x weekly - 2 sets - 10 reps  Patient Education - walking program   ASSESSMENT:  CLINICAL IMPRESSION: Patient with ongoing c/o LBP, but less than last session. Standing TE limited today due to pain into low back and B gluteals.OH reaching in standing was painful. She did not have pain with extension exercises in sitting. She got relief with lumbar stretching on ball and with pelvic tilts. She reports she is noticing improvements. Her knees are  popping less and she shifting less meaning they feel more stable. She also states that she is able to be active around her house throughout the day where previously she could only do things in the morning and then would have to rest all afternoon. Advised increasing her stretching for her LB.    Eval: Patient is a 73 y.o. female who was seen today for physical therapy evaluation and treatment for weakness in B LE and chronic B knee and back pain. She was last seen in this clinic in February. She reports some improvements since that time. She is able to get into her car easier and she is having less popping in her knees. She is working to lose weight in order to get TKR surgery, however, her MD is concerned that she is losing muscle mass. She is limited with ambulation with her RW to 1 min and 20 sec. During that time she was able to walk 98 ft. Her LEFS 29/80. She also demonstrates B LE weakness. She will benefit from skilled PT to address these deficits.    OBJECTIVE IMPAIRMENTS: decreased activity tolerance, decreased mobility, decreased strength, postural dysfunction, obesity, and pain.   GOALS: Goals reviewed with patient? Yes  SHORT TERM GOALS: Target date: 12/17/23 Ind with initial HEP Baseline: Goal status: MET  2.  Able to complete 2 min walk test showing improved strength. Baseline: 1 min 20 sec/ 98 ft 12/03/23: 2.5 minutes, 2 laps in clinic  (160 ft) Goal status: MET  LONG TERM GOALS: Target date: 02/14/24  Patient to demonstrate improved gait speed to >=1.6 ft/sec to demo improved community ambulation status Baseline: 1.10 ft/sec 11/21/23 Goal status: ON GOING   2.  Pt able to perform 5XSTS in < = to 16 seconds demonstrating improved strength Baseline: 21 sec  01/01/24: 21.2 seconds  01/09/24 26.5 sec  Goal status: IN PROGRESS  3.  Patient able to ambulate 300 feet without a sitting break to improve community ambulation. Baseline:  01/13/24:  Goal status: ON GOING    PLAN:  PT FREQUENCY: 2x/week  PT DURATION: 8 weeks  PLANNED INTERVENTIONS: 97164- PT Re-evaluation, 97110-Therapeutic exercises, 97530- Therapeutic activity, 97112- Neuromuscular re-education, 97535- Self Care, 02859- Manual therapy, [phone]- Gait training, [phone]- Aquatic Therapy, Patient/Family education, Balance training, Stair training, Joint mobilization, DME instructions, Cryotherapy, and Moist heat  PLAN FOR NEXT SESSION: work on LE strength, sit to stand, gait endurance.  Mliss Cummins, PT 01/16/24 12:42 PM

## 2024-01-28 ENCOUNTER — Encounter: Payer: Self-pay | Admitting: Physical Therapy

## 2024-01-28 ENCOUNTER — Encounter: Payer: Self-pay | Admitting: Family Medicine

## 2024-01-28 ENCOUNTER — Ambulatory Visit: Admitting: Physical Therapy

## 2024-01-28 DIAGNOSIS — M25562 Pain in left knee: Secondary | ICD-10-CM | POA: Diagnosis not present

## 2024-01-28 DIAGNOSIS — M6281 Muscle weakness (generalized): Secondary | ICD-10-CM

## 2024-01-28 DIAGNOSIS — R2689 Other abnormalities of gait and mobility: Secondary | ICD-10-CM | POA: Diagnosis not present

## 2024-01-28 DIAGNOSIS — M5459 Other low back pain: Secondary | ICD-10-CM | POA: Diagnosis not present

## 2024-01-28 DIAGNOSIS — G8929 Other chronic pain: Secondary | ICD-10-CM | POA: Diagnosis not present

## 2024-01-28 DIAGNOSIS — M25561 Pain in right knee: Secondary | ICD-10-CM | POA: Diagnosis not present

## 2024-01-28 NOTE — Therapy (Signed)
 OUTPATIENT PHYSICAL THERAPY LOWER EXTREMITY TREATMENT   Patient Name: Natalie Wright MRN: [id number] DOB:[birth year], 73 y.o., female Today's Date: 01/28/2024   END OF SESSION:  PT End of Session - 01/28/24 1141     Visit Number 15    Number of Visits 17    Date for PT Re-Evaluation 02/14/24    Authorization Type medicare and tricare    Authorization - Visit Number 15    Progress Note Due on Visit 20    PT Start Time 1100    PT Stop Time 1141    PT Time Calculation (min) 41 min    Activity Tolerance Patient tolerated treatment well    Behavior During Therapy WFL for tasks assessed/performed           Past Medical History:  Diagnosis Date   Allergy    to cats and dogs   Anemia    Arthritis    Cataract    Diabetes mellitus    GERD (gastroesophageal reflux disease)    Glaucoma    Hiatal hernia    and delayed gastric emptying/ sees [facility] for recurrent diarrhea   History of cardiovascular disorder 12/10/2008   Qualifier: Diagnosis of  By: Alvan MD, Catherine     Hypertension    OSA (obstructive sleep apnea)    CPAP   Pedal edema    Sleep apnea    Spastic bladder    urology in [place]   TIA (transient ischemic attack)    hx of- sees Dr Robin (Neurology)   Past Surgical History:  Procedure Laterality Date   CATARACT EXTRACTION  08/2021   IR ANGIOGRAM SELECTIVE EACH ADDITIONAL VESSEL  04/19/2023   IR ANGIOGRAM SELECTIVE EACH ADDITIONAL VESSEL  04/19/2023   IR ANGIOGRAM SELECTIVE EACH ADDITIONAL VESSEL  04/19/2023   IR EMBO ARTERIAL NOT HEMORR HEMANG INC GUIDE ROADMAPPING  04/19/2023   IR EMBO ARTERIAL NOT HEMORR HEMANG INC GUIDE ROADMAPPING  06/07/2023   IR RADIOLOGIST EVAL & MGMT  03/08/2023   IR RADIOLOGIST EVAL & MGMT  05/20/2023   IR RADIOLOGIST EVAL & MGMT  07/19/2023   IR US  GUIDE VASC ACCESS RIGHT  04/19/2023   TUBAL LIGATION     vaginal skin tag removal     Patient Active Problem List   Diagnosis Date Noted   BMI 50.0-59.9, adult (HCC)  06/04/2023   Diabetic retinopathy (HCC) 02/26/2023   Lumbar spondylosis 07/03/2022   Lumbar spinal stenosis 02/26/2022   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/2022   Bilateral hip pain 01/12/2022   Hearing loss due to cerumen impaction, right 01/12/2022   Hammertoe, bilateral 08/23/2021   History of foot ulcer 08/23/2021   Primary osteoarthritis of both knees 12/09/2020   History of burning pain in leg 08/29/2020   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   Tremor 05/05/2020   Pre-ulcerative corn or callous 01/29/2020   History of transient ischemic attack (TIA) 05/15/2018   Family history of ovarian cancer 04/02/2017   Aortic atherosclerosis (HCC) 08/31/2016   Venous stasis 08/10/2016   Palpitations 07/16/2016   Atrial dilatation, left 07/16/2016   Atrial septal aneurysm 07/16/2016   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   Cataract 04/18/2012   Hyperlipidemia 02/13/2011   Pulmonary nodules 09/22/2010   LEG PAIN 09/12/2009   Sleep apnea 04/13/2009   GERD 03/14/2009   Morbid obesity (HCC) 12/10/2008   Essential hypertension, benign 12/10/2008   RAYNAUD'S DISEASE 12/10/2008   Osteoarthritis 12/10/2008   URINARY INCONTINENCE 12/10/2008  PCP: Alvan Dorothyann BIRCH, MD REFERRING PROVIDER: Curtis Debby PARAS,* REFERRING DIAG: M17.0 (ICD-10-CM) - Primary osteoarthritis of both kneesM47.816 (ICD-10-CM) - Lumbar spondylosisR29.898 (ICD-10-CM) - Weakness of both lower extremities  THERAPY DIAG:  Chronic pain of left knee  Other abnormalities of gait and mobility  Muscle weakness (generalized)  Rationale for Evaluation and Treatment: Rehabilitation  ONSET DATE: lastest episode   SUBJECTIVE:   SUBJECTIVE STATEMENT: Pt states that she is having low back and hip pain and feels it is time for another shot. She has had to take tylenol  during the day due to the pain   PERTINENT HISTORY: Diabetes, HTN, TIA, spinal stenosis, polymyalgia  rheumatica   PAIN:  Are you having pain? Yes: NPRS scale: 3/10  Pain location: bilateral knees  Pain description: sore Aggravating factors: Raising B legs hurts the knees, trying to move after standing, pivoting to step  Relieving factors: nothing  PRECAUTIONS: Fall  WEIGHT BEARING RESTRICTIONS: No  FALLS:  Has patient fallen in last 6 months? No  LIVING ENVIRONMENT: Lives with: lives alone spouse passed away 2022-05-19 Lives in: House/apartment Stairs: No Has following equipment at home: Quad cane large base and shower bench, grab bars, scooter (uses husband's scooter)  PLOF: declining mobility  PATIENT GOALS: continue to get stronger, more mobility and less comfort  NEXT MD VISIT: 01/14/24  OBJECTIVE:  Note: Objective measures were completed at Evaluation unless otherwise noted.  DIAGNOSTIC FINDINGS: none  PATIENT SURVEYS:  LEFS 29 / 80 = 36.3 % 01/08/14: 32/80; 40%    SENSATION: Notes neuropathy with some burning in feet, dec'd sensation  EDEMA:  Bilateral swelling noted in LEs    LOWER EXTREMITY ROM: Active ROM Right eval Left eval  Knee flexion 108 103  Knee extension 0 0   (Blank rows = not tested)  LOWER EXTREMITY MMT: MMT Right eval Left eval Right  01/09/24 Left  01/09/24  Hip flexion 4+ 4+ 5- 5-  Hip extension 5 4+    Hip abduction 4+ 4+ 5- 5-  Hip adduction      Hip internal rotation      Hip external rotation      Knee flexion 5 5    Knee extension 5 5    Ankle dorsiflexion 5 5    Ankle plantarflexion      Ankle inversion      Ankle eversion       (Blank rows = not tested)  FUNCTIONAL TESTS:  5 times sit to stand: 21 sec 2 minute walk test: 1 min 20 sec  98 ft 4/10 pain (pain went down from 6/10) Gait speed: 33 ft 29.88 seconds =  1.10 ft/sec  01/09/24: 5 times sit to stand = 26.5 sec   GAIT: Distance walked: 80 feet  Assistive device utilized: Environmental consultant - 2 wheeled Level of assistance: Modified independence   Independent with  walker Comments: forward trunk lean  [facility] Adult PT Treatment:                                                DATE: 01/28/24 Therapeutic Exercise/Activity: LTR x 2 min Supine HS stretch with strap 2 x 30 sec Supine ITB stretch with strap 2 x 30 sec Bridge 2 x 10 STS 2 x 5 Standing march 2 x 1 min Seated row red TB x 20 Seated bilat ER red TB  x 20  Gait: 160' with RW, improved posture, no rest breaks   [facility] Adult PT Treatment:                                                DATE: 01/16/2024 Therapeutic Exercise:  Seated Shoulder flexion reaching overhead  with ball x 10, chops B x 10 with ball 3 way ball roll outs on green plyo ball (also did later after sit to stand exercises Pelvic tilts  Row red TB 10 x 2  Shoulder ER bilat red TB x 10 x 2   Therapeutic Activity: Sit<>stand 10 reps  Attempted OH reaching with sit to stands - not tolerated today Seated trunk ext with purple powerband x 10 Sit<>stand 2 x  5 holding 5# KB at waist Standing working on upright posture and alignment without assistive device to patient tolerance - 2 trials standing from ~ 20-60 seconds limited by hip pain Standing marching in walker x 5 B - decreases pain  Gait: 80 ft nonstop with RW x 1 VC for upright posture - had to stop due to pain   [facility] Adult PT Treatment:                                                DATE: 01/13/2024 Therapeutic Exercise: Standing stretch rolling green bolster up the wall LTR x 2 min Bridges 2x10  Supine HS/ITB/ADD stretches with strap x30 each (bil) Neuromuscular Re-eduction: Standing + overhead press with 5#KB  2 x 5 Isometric knee extension with orange PB x10  Isometric knee flexion with green bolster x10 Therapeutic Activity: STS x 5  Side bend reach in standing (alternating) STS + cradling 5# KB  x 5    [facility] Adult PT Treatment:                                                DATE: 01/09/24 Therapeutic Exercise: Standing  Seated Shoulder flexion reaching  overhead x 5 x 2  Row red TB 10 x 2  Shoulder ER bilat red TB x 10 x 2  LAQ sitting  Therapeutic Activity: Sit<>stand 5 reps x 2 sets Sit<>stand x 5 reps x 2 with overhead reaching to engage core Sit<>stand x 5 holding 5# KB at waist  Standing working on upright posture and alignment without assistive device to patient tolerance - 3 trials standing from ~ 20-60 seconds  Bridge 5 sec x 5 x 2 unable to clear hips from surface  Gait: 80 ft nonstop with RW x 3 VC for upright posture     PATIENT EDUCATION:  Education details: HEP review Person educated: Patient Education method: Explanation Education comprehension: verbalized understanding  HOME EXERCISE PROGRAM: Access Code: [email or web address] URL: [email or web address] Date: 12/03/2023 Prepared by: Lucie Meeter  Exercises - Supine Active Straight Leg Raise  - 1 x daily - 7 x weekly - 1 sets - 10 reps - Supine Bridge  - 2 x daily - 4 x weekly - 1 sets - 10 reps - Heel Toe Raises with Counter Support  - 2 x daily - 7  x weekly - 1 sets - 10 reps - Seated March  - 2 x daily - 7 x weekly - 1 sets - 10 reps - Sidelying hip Flexion and ABDuction  - 1 x daily - 3 x weekly - 2 sets - 10 reps  Patient Education - walking program   ASSESSMENT:  CLINICAL IMPRESSION: Continued to lumbar mobility and stretching to decrease c/o stiffness. Pt able to complete exercises today without c/o knee pain. She c/o tightness in her back but no pain. Able to walk 160' today with improved posture   Eval: Patient is a 73 y.o. female who was seen today for physical therapy evaluation and treatment for weakness in B LE and chronic B knee and back pain. She was last seen in this clinic in February. She reports some improvements since that time. She is able to get into her car easier and she is having less popping in her knees. She is working to lose weight in order to get TKR surgery, however, her MD is concerned that she is losing muscle mass. She is  limited with ambulation with her RW to 1 min and 20 sec. During that time she was able to walk 98 ft. Her LEFS 29/80. She also demonstrates B LE weakness. She will benefit from skilled PT to address these deficits.    OBJECTIVE IMPAIRMENTS: decreased activity tolerance, decreased mobility, decreased strength, postural dysfunction, obesity, and pain.   GOALS: Goals reviewed with patient? Yes  SHORT TERM GOALS: Target date: 12/17/23 Ind with initial HEP Baseline: Goal status: MET  2.  Able to complete 2 min walk test showing improved strength. Baseline: 1 min 20 sec/ 98 ft 12/03/23: 2.5 minutes, 2 laps in clinic  (160 ft) Goal status: MET  LONG TERM GOALS: Target date: 02/14/24  Patient to demonstrate improved gait speed to >=1.6 ft/sec to demo improved community ambulation status Baseline: 1.10 ft/sec 11/21/23 Goal status: ON GOING   2.  Pt able to perform 5XSTS in < = to 16 seconds demonstrating improved strength Baseline: 21 sec  01/01/24: 21.2 seconds  01/09/24 26.5 sec  Goal status: IN PROGRESS  3.  Patient able to ambulate 300 feet without a sitting break to improve community ambulation. Baseline:  01/13/24:  Goal status: ON GOING   PLAN:  PT FREQUENCY: 2x/week  PT DURATION: 8 weeks  PLANNED INTERVENTIONS: 97164- PT Re-evaluation, 97110-Therapeutic exercises, 97530- Therapeutic activity, 97112- Neuromuscular re-education, 97535- Self Care, 02859- Manual therapy, [phone]- Gait training, [phone]- Aquatic Therapy, Patient/Family education, Balance training, Stair training, Joint mobilization, DME instructions, Cryotherapy, and Moist heat  PLAN FOR NEXT SESSION: work on LE strength, sit to stand, gait endurance.  Darice Conine, PT,DPT07/29/2511:42 AM

## 2024-01-30 ENCOUNTER — Ambulatory Visit: Admitting: Rehabilitative and Restorative Service Providers"

## 2024-01-30 DIAGNOSIS — M6281 Muscle weakness (generalized): Secondary | ICD-10-CM

## 2024-01-30 DIAGNOSIS — R2689 Other abnormalities of gait and mobility: Secondary | ICD-10-CM

## 2024-01-30 DIAGNOSIS — M25561 Pain in right knee: Secondary | ICD-10-CM | POA: Diagnosis not present

## 2024-01-30 DIAGNOSIS — M5459 Other low back pain: Secondary | ICD-10-CM | POA: Diagnosis not present

## 2024-01-30 DIAGNOSIS — G8929 Other chronic pain: Secondary | ICD-10-CM | POA: Diagnosis not present

## 2024-01-30 DIAGNOSIS — M25562 Pain in left knee: Secondary | ICD-10-CM | POA: Diagnosis not present

## 2024-01-30 NOTE — Therapy (Signed)
 OUTPATIENT PHYSICAL THERAPY LOWER EXTREMITY TREATMENT   Patient Name: Natalie Wright MRN: [id number] DOB:06/26/1951, 73 y.o., female Today's Date: 01/30/2024   END OF SESSION:  PT End of Session - 01/30/24 1250     Visit Number 16    Number of Visits 17    Date for PT Re-Evaluation 02/14/24    Authorization Type medicare and tricare    Progress Note Due on Visit 20    PT Start Time 1106    PT Stop Time 1145    PT Time Calculation (min) 39 min    Activity Tolerance Patient tolerated treatment well    Behavior During Therapy WFL for tasks assessed/performed          Past Medical History:  Diagnosis Date   Allergy    to cats and dogs   Anemia    Arthritis    Cataract    Diabetes mellitus    GERD (gastroesophageal reflux disease)    Glaucoma    Hiatal hernia    and delayed gastric emptying/ sees [facility] for recurrent diarrhea   History of cardiovascular disorder 12/10/2008   Qualifier: Diagnosis of  By: Alvan MD, Catherine     Hypertension    OSA (obstructive sleep apnea)    CPAP   Pedal edema    Sleep apnea    Spastic bladder    urology in [place]   TIA (transient ischemic attack)    hx of- sees Dr Robin (Neurology)   Past Surgical History:  Procedure Laterality Date   CATARACT EXTRACTION  08/2021   IR ANGIOGRAM SELECTIVE EACH ADDITIONAL VESSEL  04/19/2023   IR ANGIOGRAM SELECTIVE EACH ADDITIONAL VESSEL  04/19/2023   IR ANGIOGRAM SELECTIVE EACH ADDITIONAL VESSEL  04/19/2023   IR EMBO ARTERIAL NOT HEMORR HEMANG INC GUIDE ROADMAPPING  04/19/2023   IR EMBO ARTERIAL NOT HEMORR HEMANG INC GUIDE ROADMAPPING  06/07/2023   IR RADIOLOGIST EVAL & MGMT  03/08/2023   IR RADIOLOGIST EVAL & MGMT  05/20/2023   IR RADIOLOGIST EVAL & MGMT  07/19/2023   IR US  GUIDE VASC ACCESS RIGHT  04/19/2023   TUBAL LIGATION     vaginal skin tag removal     Patient Active Problem List   Diagnosis Date Noted   BMI 50.0-59.9, adult (HCC) 06/04/2023   Diabetic retinopathy (HCC)  02/26/2023   Lumbar spondylosis 07/03/2022   Lumbar spinal stenosis 02/26/2022   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/2022   Bilateral hip pain 01/12/2022   Hearing loss due to cerumen impaction, right 01/12/2022   Hammertoe, bilateral 08/23/2021   History of foot ulcer 08/23/2021   Primary osteoarthritis of both knees 12/09/2020   History of burning pain in leg 08/29/2020   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   Tremor 05/05/2020   Pre-ulcerative corn or callous 01/29/2020   History of transient ischemic attack (TIA) 05/15/2018   Family history of ovarian cancer 04/02/2017   Aortic atherosclerosis (HCC) 08/31/2016   Venous stasis 08/10/2016   Palpitations 07/16/2016   Atrial dilatation, left 07/16/2016   Atrial septal aneurysm 07/16/2016   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   Cataract 04/18/2012   Hyperlipidemia 02/13/2011   Pulmonary nodules 09/22/2010   LEG PAIN 09/12/2009   Sleep apnea 04/13/2009   GERD 03/14/2009   Morbid obesity (HCC) 12/10/2008   Essential hypertension, benign 12/10/2008   RAYNAUD'S DISEASE 12/10/2008   Osteoarthritis 12/10/2008   URINARY INCONTINENCE 12/10/2008    PCP: Alvan Dorothyann BIRCH, MD REFERRING PROVIDER: Curtis Ned  J,* REFERRING DIAG: M17.0 (ICD-10-CM) - Primary osteoarthritis of both kneesM47.816 (ICD-10-CM) - Lumbar spondylosisR29.898 (ICD-10-CM) - Weakness of both lower extremities  THERAPY DIAG:  Chronic pain of left knee  Other abnormalities of gait and mobility  Muscle weakness (generalized)  Rationale for Evaluation and Treatment: Rehabilitation  ONSET DATE: lastest episode   SUBJECTIVE:   SUBJECTIVE STATEMENT: The patient reports she has contacted her doctor to work towards scheduling an injection.   PERTINENT HISTORY: Diabetes, HTN, TIA, spinal stenosis, polymyalgia rheumatica   PAIN:  Are you having pain? Yes: NPRS scale: 3/10  Pain location: bilateral knees  Pain  description: sore Aggravating factors: Raising B legs hurts the knees, trying to move after standing, pivoting to step  Relieving factors: nothing  PRECAUTIONS: Fall  WEIGHT BEARING RESTRICTIONS: No  FALLS:  Has patient fallen in last 6 months? No  LIVING ENVIRONMENT: Lives with: lives alone spouse passed away May 20, 2022 Lives in: House/apartment Stairs: No Has following equipment at home: Quad cane large base and shower bench, grab bars, scooter (uses husband's scooter)  PLOF: declining mobility  PATIENT GOALS: continue to get stronger, more mobility and less comfort  OBJECTIVE:  Note: Objective measures were completed at Evaluation unless otherwise noted.  DIAGNOSTIC FINDINGS: none  PATIENT SURVEYS:  LEFS 29 / 80 = 36.3 % 01/08/14: 32/80; 40%   SENSATION: Notes neuropathy with some burning in feet, dec'd sensation  EDEMA:  Bilateral swelling noted in LEs    LOWER EXTREMITY ROM: Active ROM Right eval Left eval  Knee flexion 108 103  Knee extension 0 0   (Blank rows = not tested)  LOWER EXTREMITY MMT: MMT Right eval Left eval Right  01/09/24 Left  01/09/24  Hip flexion 4+ 4+ 5- 5-  Hip extension 5 4+    Hip abduction 4+ 4+ 5- 5-  Hip adduction      Hip internal rotation      Hip external rotation      Knee flexion 5 5    Knee extension 5 5    Ankle dorsiflexion 5 5    Ankle plantarflexion      Ankle inversion      Ankle eversion       (Blank rows = not tested)  FUNCTIONAL TESTS:  5 times sit to stand: 21 sec 2 minute walk test: 1 min 20 sec  98 ft 4/10 pain (pain went down from 6/10) Gait speed: 33 ft 29.88 seconds =  1.10 ft/sec  01/09/24: 5 times sit to stand = 26.5 sec   GAIT: Distance walked: 80 feet  Assistive device utilized: Environmental consultant - 2 wheeled Level of assistance: Modified independence   Independent with walker Comments: forward trunk lean  [facility] Adult PT Treatment:                                                DATE: 01/30/24 Therapeutic  Exercise: Isometric knee flexion into physioball x 10 reps R and L Alternating marching supine x 10 reps Lumbar rotation x 1 minute with legs on physioball SLR x 10 reps R and L  Standing heel raises with UE support Therapeutic Activity: Sit to stand x 5 reps x 2 sets Standing reaching overhead with small physioball x 5 reps Standing with 3# overhead R and L alternating x 10 reps Seated marching with ball holds for core engagement Gait:  Gait mechanics working on upright posture and dec'd flexion leaning on RW x 200 ft. Gait x 150 ft with cues on upright posture Patient has narrow base of support due to glut med weakness Self Care: Discussed long term plan of introducing community exercise for a plan when therapy ends Provided [facility]out to [facility] Adult PT Treatment:                                                DATE: 01/28/24 Therapeutic Exercise/Activity: LTR x 2 min Supine HS stretch with strap 2 x 30 sec Supine ITB stretch with strap 2 x 30 sec Bridge 2 x 10 STS 2 x 5 Standing march 2 x 1 min Seated row red TB x 20 Seated bilat ER red TB x 20  Gait: 160' with RW, improved posture, no rest breaks   [facility] Adult PT Treatment:                                                DATE: 01/16/2024 Therapeutic Exercise:  Seated Shoulder flexion reaching overhead  with ball x 10, chops B x 10 with ball 3 way ball roll outs on green plyo ball (also did later after sit to stand exercises Pelvic tilts  Row red TB 10 x 2  Shoulder ER bilat red TB x 10 x 2   Therapeutic Activity: Sit<>stand 10 reps  Attempted OH reaching with sit to stands - not tolerated today Seated trunk ext with purple powerband x 10 Sit<>stand 2 x  5 holding 5# KB at waist Standing working on upright posture and alignment without assistive device to patient tolerance - 2 trials standing from ~ 20-60 seconds limited by hip pain Standing marching in walker x 5 B - decreases  pain  Gait: 80 ft nonstop with RW x 1 VC for upright posture - had to stop due to pain   [facility] Adult PT Treatment:                                                DATE: 01/13/2024 Therapeutic Exercise: Standing stretch rolling green bolster up the wall LTR x 2 min Bridges 2x10  Supine HS/ITB/ADD stretches with strap x30 each (bil) Neuromuscular Re-eduction: Standing + overhead press with 5#KB  2 x 5 Isometric knee extension with orange PB x10  Isometric knee flexion with green bolster x10 Therapeutic Activity: STS x 5  Side bend reach in standing (alternating) STS + cradling 5# KB  x 5    PATIENT EDUCATION:  Education details: HEP review Person educated: Patient Education method: Explanation Education comprehension: verbalized understanding  HOME EXERCISE PROGRAM: Access Code: [email or web address] URL: [email or web address] Date: 12/03/2023 Prepared by: Lucie Meeter  Exercises - Supine Active Straight Leg Raise  - 1 x daily - 7 x weekly - 1 sets - 10 reps - Supine Bridge  - 2 x daily - 4 x weekly - 1 sets - 10 reps - Heel Toe Raises with Counter Support  - 2 x daily - 7 x weekly - 1 sets -  10 reps - Seated March  - 2 x daily - 7 x weekly - 1 sets - 10 reps - Sidelying hip Flexion and ABDuction  - 1 x daily - 3 x weekly - 2 sets - 10 reps  Patient Education - walking program   ASSESSMENT:  CLINICAL IMPRESSION: The patient reports she is starting the process for a back injection due to lateral hip and thigh pain. She is working on strengthening exercises at home. PT and patient discussed working towards community exercise and PT provided information for [facility] classes. Plan to continue to work towards d/c with focus on endurance (extended ambulation), LE strengthening, and pain management.    Eval: Patient is a 73 y.o. female who was seen today for physical therapy evaluation and treatment for weakness in B LE and chronic B knee and back pain. She was last  seen in this clinic in February. She reports some improvements since that time. She is able to get into her car easier and she is having less popping in her knees. She is working to lose weight in order to get TKR surgery, however, her MD is concerned that she is losing muscle mass. She is limited with ambulation with her RW to 1 min and 20 sec. During that time she was able to walk 98 ft. Her LEFS 29/80. She also demonstrates B LE weakness. She will benefit from skilled PT to address these deficits.    OBJECTIVE IMPAIRMENTS: decreased activity tolerance, decreased mobility, decreased strength, postural dysfunction, obesity, and pain.   GOALS: Goals reviewed with patient? Yes  SHORT TERM GOALS: Target date: 12/17/23 Ind with initial HEP Baseline: Goal status: MET  2.  Able to complete 2 min walk test showing improved strength. Baseline: 1 min 20 sec/ 98 ft 12/03/23: 2.5 minutes, 2 laps in clinic  (160 ft) Goal status: MET  LONG TERM GOALS: Target date: 02/14/24  Patient to demonstrate improved gait speed to >=1.6 ft/sec to demo improved community ambulation status Baseline: 1.10 ft/sec 11/21/23 Goal status: ON GOING   2.  Pt able to perform 5XSTS in < = to 16 seconds demonstrating improved strength Baseline: 21 sec  01/01/24: 21.2 seconds  01/09/24 26.5 sec  Goal status: IN PROGRESS  3.  Patient able to ambulate 300 feet without a sitting break to improve community ambulation. Baseline:  Goal status: ON GOING   PLAN:  PT FREQUENCY: 2x/week  PT DURATION: 8 weeks  PLANNED INTERVENTIONS: 97164- PT Re-evaluation, 97110-Therapeutic exercises, 97530- Therapeutic activity, [id number]- Neuromuscular re-education, 97535- Self Care, 02859- Manual therapy, [phone]- Gait training, [phone]- Aquatic Therapy, Patient/Family education, Balance training, Stair training, Joint mobilization, DME instructions, Cryotherapy, and Moist heat  PLAN FOR NEXT SESSION: work on LE strength, sit to stand, gait endurance.   Work towards d/c for 8/15--gave handout for [facility]  [place], PT 01/30/2511:51 PM

## 2024-02-03 DIAGNOSIS — K219 Gastro-esophageal reflux disease without esophagitis: Secondary | ICD-10-CM | POA: Diagnosis not present

## 2024-02-03 DIAGNOSIS — K8681 Exocrine pancreatic insufficiency: Secondary | ICD-10-CM | POA: Diagnosis not present

## 2024-02-03 DIAGNOSIS — R109 Unspecified abdominal pain: Secondary | ICD-10-CM | POA: Diagnosis not present

## 2024-02-04 ENCOUNTER — Encounter: Payer: Self-pay | Admitting: Physical Therapy

## 2024-02-04 ENCOUNTER — Ambulatory Visit: Attending: Sports Medicine | Admitting: Physical Therapy

## 2024-02-04 ENCOUNTER — Other Ambulatory Visit: Payer: Self-pay | Admitting: Family Medicine

## 2024-02-04 DIAGNOSIS — M5459 Other low back pain: Secondary | ICD-10-CM | POA: Insufficient documentation

## 2024-02-04 DIAGNOSIS — M25562 Pain in left knee: Secondary | ICD-10-CM | POA: Diagnosis not present

## 2024-02-04 DIAGNOSIS — R2689 Other abnormalities of gait and mobility: Secondary | ICD-10-CM | POA: Diagnosis present

## 2024-02-04 DIAGNOSIS — M25561 Pain in right knee: Secondary | ICD-10-CM | POA: Diagnosis present

## 2024-02-04 DIAGNOSIS — M6281 Muscle weakness (generalized): Secondary | ICD-10-CM | POA: Diagnosis present

## 2024-02-04 DIAGNOSIS — G8929 Other chronic pain: Secondary | ICD-10-CM | POA: Insufficient documentation

## 2024-02-04 DIAGNOSIS — E1149 Type 2 diabetes mellitus with other diabetic neurological complication: Secondary | ICD-10-CM

## 2024-02-04 NOTE — Therapy (Signed)
 OUTPATIENT PHYSICAL THERAPY LOWER EXTREMITY TREATMENT   Patient Name: Natalie Wright MRN: [id number] DOB:[birth year], 73 y.o., female Today's Date: 02/04/2024   END OF SESSION:  PT End of Session - 02/04/24 1143     Visit Number 17    Number of Visits 17    Date for PT Re-Evaluation 02/14/24    Authorization Type medicare and tricare    Authorization - Visit Number 16    Progress Note Due on Visit 20    PT Start Time 1100    PT Stop Time 1143    PT Time Calculation (min) 43 min    Activity Tolerance Patient tolerated treatment well    Behavior During Therapy WFL for tasks assessed/performed           Past Medical History:  Diagnosis Date   Allergy    to cats and dogs   Anemia    Arthritis    Cataract    Diabetes mellitus    GERD (gastroesophageal reflux disease)    Glaucoma    Hiatal hernia    and delayed gastric emptying/ sees [facility] for recurrent diarrhea   History of cardiovascular disorder 12/10/2008   Qualifier: Diagnosis of  By: Alvan MD, Catherine     Hypertension    OSA (obstructive sleep apnea)    CPAP   Pedal edema    Sleep apnea    Spastic bladder    urology in [place]   TIA (transient ischemic attack)    hx of- sees Dr Robin (Neurology)   Past Surgical History:  Procedure Laterality Date   CATARACT EXTRACTION  08/2021   IR ANGIOGRAM SELECTIVE EACH ADDITIONAL VESSEL  04/19/2023   IR ANGIOGRAM SELECTIVE EACH ADDITIONAL VESSEL  04/19/2023   IR ANGIOGRAM SELECTIVE EACH ADDITIONAL VESSEL  04/19/2023   IR EMBO ARTERIAL NOT HEMORR HEMANG INC GUIDE ROADMAPPING  04/19/2023   IR EMBO ARTERIAL NOT HEMORR HEMANG INC GUIDE ROADMAPPING  06/07/2023   IR RADIOLOGIST EVAL & MGMT  03/08/2023   IR RADIOLOGIST EVAL & MGMT  05/20/2023   IR RADIOLOGIST EVAL & MGMT  07/19/2023   IR US  GUIDE VASC ACCESS RIGHT  04/19/2023   TUBAL LIGATION     vaginal skin tag removal     Patient Active Problem List   Diagnosis Date Noted   BMI 50.0-59.9, adult (HCC)  06/04/2023   Diabetic retinopathy (HCC) 02/26/2023   Lumbar spondylosis 07/03/2022   Lumbar spinal stenosis 02/26/2022   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/2022   Bilateral hip pain 01/12/2022   Hearing loss due to cerumen impaction, right 01/12/2022   Hammertoe, bilateral 08/23/2021   History of foot ulcer 08/23/2021   Primary osteoarthritis of both knees 12/09/2020   History of burning pain in leg 08/29/2020   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   Tremor 05/05/2020   Pre-ulcerative corn or callous 01/29/2020   History of transient ischemic attack (TIA) 05/15/2018   Family history of ovarian cancer 04/02/2017   Aortic atherosclerosis (HCC) 08/31/2016   Venous stasis 08/10/2016   Palpitations 07/16/2016   Atrial dilatation, left 07/16/2016   Atrial septal aneurysm 07/16/2016   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   Cataract 04/18/2012   Hyperlipidemia 02/13/2011   Pulmonary nodules 09/22/2010   LEG PAIN 09/12/2009   Sleep apnea 04/13/2009   GERD 03/14/2009   Morbid obesity (HCC) 12/10/2008   Essential hypertension, benign 12/10/2008   RAYNAUD'S DISEASE 12/10/2008   Osteoarthritis 12/10/2008   URINARY INCONTINENCE 12/10/2008  PCP: Alvan Dorothyann BIRCH, MD REFERRING PROVIDER: Curtis Debby PARAS,* REFERRING DIAG: M17.0 (ICD-10-CM) - Primary osteoarthritis of both kneesM47.816 (ICD-10-CM) - Lumbar spondylosisR29.898 (ICD-10-CM) - Weakness of both lower extremities  THERAPY DIAG:  Chronic pain of left knee  Other abnormalities of gait and mobility  Muscle weakness (generalized)  Chronic pain of right knee  Rationale for Evaluation and Treatment: Rehabilitation  ONSET DATE: lastest episode   SUBJECTIVE:   SUBJECTIVE STATEMENT: The patient reports she is having increased soreness in knees and hips today. She can tell she needs another injection  PERTINENT HISTORY: Diabetes, HTN, TIA, spinal stenosis, polymyalgia rheumatica    PAIN:  Are you having pain? Yes: NPRS scale: 6/10  Pain location: bilateral knees  Pain description: sore Aggravating factors: Raising B legs hurts the knees, trying to move after standing, pivoting to step  Relieving factors: nothing  PRECAUTIONS: Fall  WEIGHT BEARING RESTRICTIONS: No  FALLS:  Has patient fallen in last 6 months? No  LIVING ENVIRONMENT: Lives with: lives alone spouse passed away 2022-06-03 Lives in: House/apartment Stairs: No Has following equipment at home: Quad cane large base and shower bench, grab bars, scooter (uses husband's scooter)  PLOF: declining mobility  PATIENT GOALS: continue to get stronger, more mobility and less comfort  OBJECTIVE:  Note: Objective measures were completed at Evaluation unless otherwise noted.  DIAGNOSTIC FINDINGS: none  PATIENT SURVEYS:  LEFS 29 / 80 = 36.3 % 01/08/14: 32/80; 40%   SENSATION: Notes neuropathy with some burning in feet, dec'd sensation  EDEMA:  Bilateral swelling noted in LEs    LOWER EXTREMITY ROM: Active ROM Right eval Left eval  Knee flexion 108 103  Knee extension 0 0   (Blank rows = not tested)  LOWER EXTREMITY MMT: MMT Right eval Left eval Right  01/09/24 Left  01/09/24  Hip flexion 4+ 4+ 5- 5-  Hip extension 5 4+    Hip abduction 4+ 4+ 5- 5-  Hip adduction      Hip internal rotation      Hip external rotation      Knee flexion 5 5    Knee extension 5 5    Ankle dorsiflexion 5 5    Ankle plantarflexion      Ankle inversion      Ankle eversion       (Blank rows = not tested)  FUNCTIONAL TESTS:  5 times sit to stand: 21 sec 2 minute walk test: 1 min 20 sec  98 ft 4/10 pain (pain went down from 6/10) Gait speed: 33 ft 29.88 seconds =  1.10 ft/sec  01/09/24: 5 times sit to stand = 26.5 sec   GAIT: Distance walked: 80 feet  Assistive device utilized: Environmental consultant - 2 wheeled Level of assistance: Modified independence   Independent with walker Comments: forward trunk lean  [facility]  Adult PT Treatment:                                                DATE: 02/04/24 Therapeutic Exercise: Supine LTR x 1 min SLR x 10 bilat Alternating marching x 10 OH reach holding physioball with cues for core activation Bridge 2 x 10 Seated Row green TB 2 x 10 March holding 2kg med ball Shoulder diagonals 1 KG med bal Seated heel/toe raises Seated hip abd/add   [facility] Adult PT Treatment:  DATE: 01/30/24 Therapeutic Exercise: Isometric knee flexion into physioball x 10 reps R and L Alternating marching supine x 10 reps Lumbar rotation x 1 minute with legs on physioball SLR x 10 reps R and L  Standing heel raises with UE support Therapeutic Activity: Sit to stand x 5 reps x 2 sets Standing reaching overhead with small physioball x 5 reps Standing with 3# overhead R and L alternating x 10 reps Seated marching with ball holds for core engagement Gait: Gait mechanics working on upright posture and dec'd flexion leaning on RW x 200 ft. Gait x 150 ft with cues on upright posture Patient has narrow base of support due to glut med weakness Self Care: Discussed long term plan of introducing community exercise for a plan when therapy ends Provided [facility]out to [facility] Adult PT Treatment:                                                DATE: 01/28/24 Therapeutic Exercise/Activity: LTR x 2 min Supine HS stretch with strap 2 x 30 sec Supine ITB stretch with strap 2 x 30 sec Bridge 2 x 10 STS 2 x 5 Standing march 2 x 1 min Seated row red TB x 20 Seated bilat ER red TB x 20  Gait: 160' with RW, improved posture, no rest breaks   [facility] Adult PT Treatment:                                                DATE: 01/16/2024 Therapeutic Exercise:  Seated Shoulder flexion reaching overhead  with ball x 10, chops B x 10 with ball 3 way ball roll outs on green plyo ball (also did later after sit to stand exercises Pelvic  tilts  Row red TB 10 x 2  Shoulder ER bilat red TB x 10 x 2   Therapeutic Activity: Sit<>stand 10 reps  Attempted OH reaching with sit to stands - not tolerated today Seated trunk ext with purple powerband x 10 Sit<>stand 2 x  5 holding 5# KB at waist Standing working on upright posture and alignment without assistive device to patient tolerance - 2 trials standing from ~ 20-60 seconds limited by hip pain Standing marching in walker x 5 B - decreases pain  Gait: 80 ft nonstop with RW x 1 VC for upright posture - had to stop due to pain    PATIENT EDUCATION:  Education details: HEP review Person educated: Patient Education method: Explanation Education comprehension: verbalized understanding  HOME EXERCISE PROGRAM: Access Code: [email or web address] URL: [email or web address] Date: 12/03/2023 Prepared by: Lucie Meeter  Exercises - Supine Active Straight Leg Raise  - 1 x daily - 7 x weekly - 1 sets - 10 reps - Supine Bridge  - 2 x daily - 4 x weekly - 1 sets - 10 reps - Heel Toe Raises with Counter Support  - 2 x daily - 7 x weekly - 1 sets - 10 reps - Seated March  - 2 x daily - 7 x weekly - 1 sets - 10 reps - Sidelying hip Flexion and ABDuction  - 1 x daily - 3 x weekly - 2 sets - 10 reps  Patient Education - walking program   ASSESSMENT:  CLINICAL IMPRESSION: Modified program based on increased pain today. Discussed plan for d/c after next week. Pt states she has contacted the [facility] and plans to attend chair exercise class.    Eval: Patient is a 73 y.o. female who was seen today for physical therapy evaluation and treatment for weakness in B LE and chronic B knee and back pain. She was last seen in this clinic in February. She reports some improvements since that time. She is able to get into her car easier and she is having less popping in her knees. She is working to lose weight in order to get TKR surgery, however, her MD is concerned that she is losing  muscle mass. She is limited with ambulation with her RW to 1 min and 20 sec. During that time she was able to walk 98 ft. Her LEFS 29/80. She also demonstrates B LE weakness. She will benefit from skilled PT to address these deficits.    OBJECTIVE IMPAIRMENTS: decreased activity tolerance, decreased mobility, decreased strength, postural dysfunction, obesity, and pain.   GOALS: Goals reviewed with patient? Yes  SHORT TERM GOALS: Target date: 12/17/23 Ind with initial HEP Baseline: Goal status: MET  2.  Able to complete 2 min walk test showing improved strength. Baseline: 1 min 20 sec/ 98 ft 12/03/23: 2.5 minutes, 2 laps in clinic  (160 ft) Goal status: MET  LONG TERM GOALS: Target date: 02/14/24  Patient to demonstrate improved gait speed to >=1.6 ft/sec to demo improved community ambulation status Baseline: 1.10 ft/sec 11/21/23 Goal status: ON GOING   2.  Pt able to perform 5XSTS in < = to 16 seconds demonstrating improved strength Baseline: 21 sec  01/01/24: 21.2 seconds  01/09/24 26.5 sec  Goal status: IN PROGRESS  3.  Patient able to ambulate 300 feet without a sitting break to improve community ambulation. Baseline:  Goal status: ON GOING   PLAN:  PT FREQUENCY: 2x/week  PT DURATION: 8 weeks  PLANNED INTERVENTIONS: [id number]- PT Re-evaluation, 97110-Therapeutic exercises, 97530- Therapeutic activity, [id number]- Neuromuscular re-education, 97535- Self Care, [id number]- Manual therapy, [phone]- Gait training, [phone]- Aquatic Therapy, Patient/Family education, Balance training, Stair training, Joint mobilization, DME instructions, Cryotherapy, and Moist heat  PLAN FOR NEXT SESSION: work on LE strength, sit to stand, gait endurance.  Work towards d/c for 8/15--gave handout for [facility]  Bertha, PT 02/03/2510:44 AM

## 2024-02-04 NOTE — Telephone Encounter (Signed)
 Natalie,  In chart as below from 06/06/23. Patient states she did not receive the G7 reader when she received the sensors. Is this something you can assist her with?     06/06/2023   Patient ID: Natalie Wright, female   DOB: 1951-02-05, 73 y.o.   MRN: [id number]   Clinic routed request from patient's PCP, Dr. Alvan, to assist with insurance coverage of Mounjaro  10mg  weekly and CGM.  Per [employer] Formulary, Mounjaro  10mg  weekly and Dexcom G7 Reader/Sensors would be covered with a prior authorization.  I have submitted PA requests to the insurance company for these prescriptions and will keep Dr. Alvan informed of response.   Natalie Wright, PharmD, DPLA

## 2024-02-05 ENCOUNTER — Other Ambulatory Visit: Payer: Self-pay | Admitting: Family Medicine

## 2024-02-05 ENCOUNTER — Encounter (INDEPENDENT_AMBULATORY_CARE_PROVIDER_SITE_OTHER): Payer: Self-pay | Admitting: Sports Medicine

## 2024-02-05 ENCOUNTER — Telehealth: Payer: Self-pay

## 2024-02-05 DIAGNOSIS — M48061 Spinal stenosis, lumbar region without neurogenic claudication: Secondary | ICD-10-CM | POA: Diagnosis not present

## 2024-02-05 NOTE — Telephone Encounter (Signed)
 Called pt to let her know the epidural order has been placed and they would call her to schedule.

## 2024-02-05 NOTE — Telephone Encounter (Signed)

## 2024-02-06 ENCOUNTER — Encounter: Payer: Self-pay | Admitting: Rehabilitative and Restorative Service Providers"

## 2024-02-06 ENCOUNTER — Ambulatory Visit: Admitting: Rehabilitative and Restorative Service Providers"

## 2024-02-06 DIAGNOSIS — G8929 Other chronic pain: Secondary | ICD-10-CM | POA: Diagnosis not present

## 2024-02-06 DIAGNOSIS — M6281 Muscle weakness (generalized): Secondary | ICD-10-CM | POA: Diagnosis not present

## 2024-02-06 DIAGNOSIS — M25562 Pain in left knee: Secondary | ICD-10-CM | POA: Diagnosis not present

## 2024-02-06 DIAGNOSIS — M25561 Pain in right knee: Secondary | ICD-10-CM | POA: Diagnosis not present

## 2024-02-06 DIAGNOSIS — R2689 Other abnormalities of gait and mobility: Secondary | ICD-10-CM

## 2024-02-06 DIAGNOSIS — M5459 Other low back pain: Secondary | ICD-10-CM | POA: Diagnosis not present

## 2024-02-06 NOTE — Therapy (Signed)
 OUTPATIENT PHYSICAL THERAPY LOWER EXTREMITY TREATMENT   Patient Name: Magaret Justo MRN: [id number] DOB:24-Aug-1950, 73 y.o., female Today's Date: 02/06/2024   END OF SESSION:  PT End of Session - 02/06/24 1111     Visit Number 18    Date for PT Re-Evaluation 02/14/24    Authorization Type medicare and tricare    Progress Note Due on Visit 20    PT Start Time 1107    PT Stop Time 1146    PT Time Calculation (min) 39 min    Activity Tolerance Patient tolerated treatment well    Behavior During Therapy WFL for tasks assessed/performed          Past Medical History:  Diagnosis Date   Allergy    to cats and dogs   Anemia    Arthritis    Cataract    Diabetes mellitus    GERD (gastroesophageal reflux disease)    Glaucoma    Hiatal hernia    and delayed gastric emptying/ sees [facility] for recurrent diarrhea   History of cardiovascular disorder 12/10/2008   Qualifier: Diagnosis of  By: Alvan MD, Catherine     Hypertension    OSA (obstructive sleep apnea)    CPAP   Pedal edema    Sleep apnea    Spastic bladder    urology in [place]   TIA (transient ischemic attack)    hx of- sees Dr Robin (Neurology)   Past Surgical History:  Procedure Laterality Date   CATARACT EXTRACTION  08/2021   IR ANGIOGRAM SELECTIVE EACH ADDITIONAL VESSEL  04/19/2023   IR ANGIOGRAM SELECTIVE EACH ADDITIONAL VESSEL  04/19/2023   IR ANGIOGRAM SELECTIVE EACH ADDITIONAL VESSEL  04/19/2023   IR EMBO ARTERIAL NOT HEMORR HEMANG INC GUIDE ROADMAPPING  04/19/2023   IR EMBO ARTERIAL NOT HEMORR HEMANG INC GUIDE ROADMAPPING  06/07/2023   IR RADIOLOGIST EVAL & MGMT  03/08/2023   IR RADIOLOGIST EVAL & MGMT  05/20/2023   IR RADIOLOGIST EVAL & MGMT  07/19/2023   IR US  GUIDE VASC ACCESS RIGHT  04/19/2023   TUBAL LIGATION     vaginal skin tag removal     Patient Active Problem List   Diagnosis Date Noted   BMI 50.0-59.9, adult (HCC) 06/04/2023   Diabetic retinopathy (HCC) 02/26/2023   Lumbar  spondylosis 07/03/2022   Lumbar spinal stenosis 02/26/2022   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/2022   Bilateral hip pain 01/12/2022   Hearing loss due to cerumen impaction, right 01/12/2022   Hammertoe, bilateral 08/23/2021   History of foot ulcer 08/23/2021   Primary osteoarthritis of both knees 12/09/2020   History of burning pain in leg 08/29/2020   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   Tremor 05/05/2020   Pre-ulcerative corn or callous 01/29/2020   History of transient ischemic attack (TIA) 05/15/2018   Family history of ovarian cancer 04/02/2017   Aortic atherosclerosis (HCC) 08/31/2016   Venous stasis 08/10/2016   Palpitations 07/16/2016   Atrial dilatation, left 07/16/2016   Atrial septal aneurysm 07/16/2016   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   Cataract 04/18/2012   Hyperlipidemia 02/13/2011   Pulmonary nodules 09/22/2010   LEG PAIN 09/12/2009   Sleep apnea 04/13/2009   GERD 03/14/2009   Morbid obesity (HCC) 12/10/2008   Essential hypertension, benign 12/10/2008   RAYNAUD'S DISEASE 12/10/2008   Osteoarthritis 12/10/2008   URINARY INCONTINENCE 12/10/2008    PCP: Alvan Dorothyann BIRCH, MD REFERRING PROVIDER: Curtis Debby PARAS,* REFERRING DIAG: M17.0 (ICD-10-CM) - Primary  osteoarthritis of both kneesM47.816 (ICD-10-CM) - Lumbar spondylosisR29.898 (ICD-10-CM) - Weakness of both lower extremities  THERAPY DIAG:  Chronic pain of left knee  Other abnormalities of gait and mobility  Muscle weakness (generalized)  Rationale for Evaluation and Treatment: Rehabilitation  ONSET DATE: lastest episode   SUBJECTIVE:   SUBJECTIVE STATEMENT: The patient reports she is continuing with increased soreness in knees and hips. The knees can feel like they can give out at times.   PERTINENT HISTORY: Diabetes, HTN, TIA, spinal stenosis, polymyalgia rheumatica   PAIN:  Are you having pain? Yes: NPRS scale: 7/10  Pain location:  bilateral knees  Pain description: sore Aggravating factors: Raising B legs hurts the knees, trying to move after standing, pivoting to step  Relieving factors: nothing  PRECAUTIONS: Fall  WEIGHT BEARING RESTRICTIONS: No  FALLS:  Has patient fallen in last 6 months? No  LIVING ENVIRONMENT: Lives with: lives alone spouse passed away 06-05-22 Lives in: House/apartment Stairs: No Has following equipment at home: Quad cane large base and shower bench, grab bars, scooter (uses husband's scooter)  PLOF: declining mobility  PATIENT GOALS: continue to get stronger, more mobility and less comfort  OBJECTIVE:  Note: Objective measures were completed at Evaluation unless otherwise noted.  DIAGNOSTIC FINDINGS: none  PATIENT SURVEYS:  LEFS 29 / 80 = 36.3 % 01/08/14: 32/80; 40%   SENSATION: Notes neuropathy with some burning in feet, dec'd sensation  EDEMA:  Bilateral swelling noted in LEs    LOWER EXTREMITY ROM: Active ROM Right eval Left eval  Knee flexion 108 103  Knee extension 0 0   (Blank rows = not tested)  LOWER EXTREMITY MMT: MMT Right eval Left eval Right  01/09/24 Left  01/09/24  Hip flexion 4+ 4+ 5- 5-  Hip extension 5 4+    Hip abduction 4+ 4+ 5- 5-  Hip adduction      Hip internal rotation      Hip external rotation      Knee flexion 5 5    Knee extension 5 5    Ankle dorsiflexion 5 5    Ankle plantarflexion      Ankle inversion      Ankle eversion       (Blank rows = not tested)  FUNCTIONAL TESTS:  5 times sit to stand: 21 sec 2 minute walk test: 1 min 20 sec  98 ft 4/10 pain (pain went down from 6/10) Gait speed: 33 ft 29.88 seconds =  1.10 ft/sec  01/09/24: 5 times sit to stand = 26.5 sec   GAIT: Distance walked: 80 feet  Assistive device utilized: Environmental consultant - 2 wheeled Level of assistance: Modified independence   Independent with walker Comments: forward trunk lean  [facility] Adult PT Treatment:                                                 DATE: 02/06/24 Therapeutic Exercise: Seated Isometric knee extension into physioball x 8 reps x 5 second holds Marching with ball  Supine SLR x 10 reps R and L sides Bridges x 10 reps Lumbar trunk rotation x 10 reps Therapeutic Activity: Sit<>stand x 5 reps without UE support Sit<> stand x 5 reps reaching ball overhead when rising with supervision Standing with 3# overhead R and L alternating x 10 reps Seated marching with ball holds for core engagement Gait:  Gait x 75 feet with cues on upright posture X 275 feet with one rest break at 130 ft with RW and cues for upright posture   [facility] Adult PT Treatment:                                                DATE: 02/04/24 Therapeutic Exercise: Supine LTR x 1 min SLR x 10 bilat Alternating marching x 10 OH reach holding physioball with cues for core activation Bridge 2 x 10 Seated Row green TB 2 x 10 March holding 2kg med ball Shoulder diagonals 1 KG med bal Seated heel/toe raises Seated hip abd/add   [facility] Adult PT Treatment:                                                DATE: 01/30/24 Therapeutic Exercise: Isometric knee flexion into physioball x 10 reps R and L Alternating marching supine x 10 reps Lumbar rotation x 1 minute with legs on physioball SLR x 10 reps R and L  Standing heel raises with UE support Therapeutic Activity: Sit to stand x 5 reps x 2 sets Standing reaching overhead with small physioball x 5 reps Standing with 3# overhead R and L alternating x 10 reps Seated marching with ball holds for core engagement Gait: Gait mechanics working on upright posture and dec'd flexion leaning on RW x 200 ft. Gait x 150 ft with cues on upright posture Patient has narrow base of support due to glut med weakness Self Care: Discussed long term plan of introducing community exercise for a plan when therapy ends Provided [facility]out to [facility] Adult [facility] Treatment:                                                 DATE: 01/28/24 Therapeutic Exercise/Activity: LTR x 2 min Supine HS stretch with strap 2 x 30 sec Supine ITB stretch with strap 2 x 30 sec Bridge 2 x 10 STS 2 x 5 Standing march 2 x 1 min Seated row red TB x 20 Seated bilat ER red TB x 20  Gait: 160' with RW, improved posture, no rest breaks   [facility] Adult PT Treatment:                                                DATE: 01/16/2024 Therapeutic Exercise:  Seated Shoulder flexion reaching overhead  with ball x 10, chops B x 10 with ball 3 way ball roll outs on green plyo ball (also did later after sit to stand exercises Pelvic tilts  Row red TB 10 x 2  Shoulder ER bilat red TB x 10 x 2   Therapeutic Activity: Sit<>stand 10 reps  Attempted OH reaching with sit to stands - not tolerated today Seated trunk ext with purple powerband x 10 Sit<>stand 2 x  5 holding 5# KB at waist Standing working on upright posture and alignment  without assistive device to patient tolerance - 2 trials standing from ~ 20-60 seconds limited by hip pain Standing marching in walker x 5 B - decreases pain  Gait: 80 ft nonstop with RW x 1 VC for upright posture - had to stop due to pain    PATIENT EDUCATION:  Education details: HEP review Person educated: Patient Education method: Explanation Education comprehension: verbalized understanding  HOME EXERCISE PROGRAM: Access Code: [email or web address] URL: [email or web address] Date: 12/03/2023 Prepared by: Lucie Meeter  Exercises - Supine Active Straight Leg Raise  - 1 x daily - 7 x weekly - 1 sets - 10 reps - Supine Bridge  - 2 x daily - 4 x weekly - 1 sets - 10 reps - Heel Toe Raises with Counter Support  - 2 x daily - 7 x weekly - 1 sets - 10 reps - Seated March  - 2 x daily - 7 x weekly - 1 sets - 10 reps - Sidelying hip Flexion and ABDuction  - 1 x daily - 3 x weekly - 2 sets - 10 reps  Patient Education - walking program   ASSESSMENT:  CLINICAL IMPRESSION: The patient  reports she has up and down function based on pain. When the shots wear off, she feels like she declines. PT is working towards d/c next week focusing on transition to community chair exercise + continuation of HEP. Will check goals next week and d/c.    Eval: Patient is a 73 y.o. female who was seen today for physical therapy evaluation and treatment for weakness in B LE and chronic B knee and back pain. She was last seen in this clinic in February. She reports some improvements since that time. She is able to get into her car easier and she is having less popping in her knees. She is working to lose weight in order to get TKR surgery, however, her MD is concerned that she is losing muscle mass. She is limited with ambulation with her RW to 1 min and 20 sec. During that time she was able to walk 98 ft. Her LEFS 29/80. She also demonstrates B LE weakness. She will benefit from skilled PT to address these deficits.    OBJECTIVE IMPAIRMENTS: decreased activity tolerance, decreased mobility, decreased strength, postural dysfunction, obesity, and pain.   GOALS: Goals reviewed with patient? Yes  SHORT TERM GOALS: Target date: 12/17/23 Ind with initial HEP Baseline: Goal status: MET  2.  Able to complete 2 min walk test showing improved strength. Baseline: 1 min 20 sec/ 98 ft 12/03/23: 2.5 minutes, 2 laps in clinic  (160 ft) Goal status: MET  LONG TERM GOALS: Target date: 02/14/24  Patient to demonstrate improved gait speed to >=1.6 ft/sec to demo improved community ambulation status Baseline: 1.10 ft/sec 11/21/23 Goal status: ON GOING   2.  Pt able to perform 5XSTS in < = to 16 seconds demonstrating improved strength Baseline: 21 sec  01/01/24: 21.2 seconds  01/09/24 26.5 sec  Goal status: IN PROGRESS  3.  Patient able to ambulate 300 feet without a sitting break to improve community ambulation. Baseline:  Goal status: ON GOING   PLAN:  PT FREQUENCY: 2x/week  PT DURATION: 8 weeks  PLANNED  INTERVENTIONS: 97164- PT Re-evaluation, 97110-Therapeutic exercises, 97530- Therapeutic activity, [id number]- Neuromuscular re-education, 97535- Self Care, [id number]- Manual therapy, [phone]- Gait training, [phone]- Aquatic Therapy, Patient/Family education, Balance training, Stair training, Joint mobilization, DME instructions, Cryotherapy, and Moist heat  PLAN FOR NEXT SESSION: work on LE strength,  sit to stand, gait endurance.  Work towards d/c for 8/15--gave handout for [facility]  [place], PT 02/05/2510:12 AM

## 2024-02-07 ENCOUNTER — Other Ambulatory Visit: Payer: Self-pay

## 2024-02-07 ENCOUNTER — Encounter: Payer: Self-pay | Admitting: Family Medicine

## 2024-02-07 DIAGNOSIS — E1149 Type 2 diabetes mellitus with other diabetic neurological complication: Secondary | ICD-10-CM

## 2024-02-07 MED ORDER — DEXCOM G7 RECEIVER DEVI: 0 refills | Status: DC

## 2024-02-07 NOTE — Progress Notes (Signed)
   02/07/2024  Patient ID: [name], female   DOB: 24-Jan-1951, 73 y.o.   MRN: [id number]  Patient returned missed call from earlier and states she never started using Dexcom G7 when the medication was approved in December 2024, because she still had plenty of test strips at home.  She recently received a letter in the mail stating their was a recall on the [facility] G7 receiver; and it was at this time she realized she had only received the G7 sensors and never the receiver.  Patient is in need of a Dexcom G7 receiver in order to use the sensors she has received.  She states she is scheduled to see Dr. Alvan again 8/26 and hopes to have all supplies by then to get education on set up and use.  Contacted [facility], and they state the receiver was filled in December with delivery confirmation from [employer].  Provided a verbal prescription under [facility] standing order for CGM to see if insurance will cover another reader at this time.  Pharmacy was not able to process order during our telephone call but states I can follow-up Monday to see if insurance will cover receiver.  I will contact the pharmacy again on Monday.  If insurance will not cover, I will notify patient and see if using the mobile app as the reader is an option.  [name], PharmD, DPLA

## 2024-02-07 NOTE — Progress Notes (Signed)
   02/07/2024  Patient ID: Natalie Wright, female   DOB: [birth year], 73 y.o.   MRN: [id number]  Outreach attempt to contact patient in regard to [facility] access.  I was not able to reach the patient but did leave a HIPAA compliant voicemail with my direct phone number.  [name], PharmD, DPLA

## 2024-02-10 ENCOUNTER — Telehealth: Payer: Self-pay

## 2024-02-10 ENCOUNTER — Ambulatory Visit: Admitting: Rehabilitative and Restorative Service Providers"

## 2024-02-10 ENCOUNTER — Encounter: Payer: Self-pay | Admitting: Rehabilitative and Restorative Service Providers"

## 2024-02-10 DIAGNOSIS — M6281 Muscle weakness (generalized): Secondary | ICD-10-CM

## 2024-02-10 DIAGNOSIS — G8929 Other chronic pain: Secondary | ICD-10-CM

## 2024-02-10 DIAGNOSIS — R2689 Other abnormalities of gait and mobility: Secondary | ICD-10-CM

## 2024-02-10 DIAGNOSIS — M25561 Pain in right knee: Secondary | ICD-10-CM | POA: Diagnosis not present

## 2024-02-10 DIAGNOSIS — M5459 Other low back pain: Secondary | ICD-10-CM

## 2024-02-10 DIAGNOSIS — M25562 Pain in left knee: Secondary | ICD-10-CM | POA: Diagnosis not present

## 2024-02-10 NOTE — Progress Notes (Signed)
   02/10/2024  Patient ID: Natalie Wright, female   DOB: 1951-03-10, 73 y.o.   MRN: [id number]  Contacted [facility] to follow-up on processing of Dexcom G7 Receiver, and pharmacy states a prior authorization is required for this prescription.  Contacted [facility] insurance to start the prior authorization process; representative states insurance should approve if receiver has not been filled in the last 6 months, but insulin  has to have been filled in the last 6 months.  Patient last filled Lantus  December of 2024, so prior authorization was denied.  Contacting patient to see if she is still using Lantus  and if a refill is needed.  Otherwise, receiver will need to be paid for out of pocket or mobile app used.  If insulin  is no longer being used, insurance may not continue to cover Dexcom G7 sensors in the future, also.  Natalie Wright, PharmD, DPLA

## 2024-02-10 NOTE — Telephone Encounter (Signed)
 Requesting rx rf of Mounjaro  12.5mg  Last written 07/05/2023 Last OV 10/11/2023 Upcoming appt 02/25/2024

## 2024-02-10 NOTE — Therapy (Signed)
 OUTPATIENT PHYSICAL THERAPY LOWER EXTREMITY TREATMENT   Patient Name: Natalie Wright MRN: [id number] DOB:05-27-1951, 73 y.o., female Today's Date: 02/10/2024   END OF SESSION:  PT End of Session - 02/10/24 1158     Visit Number 19    Number of Visits 30   reviewed notes-- recert on 7/10 and can update total #   Date for PT Re-Evaluation 02/14/24    Authorization Type medicare and tricare    Progress Note Due on Visit 20    PT Start Time 1150    PT Stop Time 1230    PT Time Calculation (min) 40 min    Activity Tolerance Patient tolerated treatment well    Behavior During Therapy WFL for tasks assessed/performed           Past Medical History:  Diagnosis Date   Allergy    to cats and dogs   Anemia    Arthritis    Cataract    Diabetes mellitus    GERD (gastroesophageal reflux disease)    Glaucoma    Hiatal hernia    and delayed gastric emptying/ sees [facility] for recurrent diarrhea   History of cardiovascular disorder 12/10/2008   Qualifier: Diagnosis of  By: Alvan MD, Catherine     Hypertension    OSA (obstructive sleep apnea)    CPAP   Pedal edema    Sleep apnea    Spastic bladder    urology in [place]   TIA (transient ischemic attack)    hx of- sees Dr Robin (Neurology)   Past Surgical History:  Procedure Laterality Date   CATARACT EXTRACTION  08/2021   IR ANGIOGRAM SELECTIVE EACH ADDITIONAL VESSEL  04/19/2023   IR ANGIOGRAM SELECTIVE EACH ADDITIONAL VESSEL  04/19/2023   IR ANGIOGRAM SELECTIVE EACH ADDITIONAL VESSEL  04/19/2023   IR EMBO ARTERIAL NOT HEMORR HEMANG INC GUIDE ROADMAPPING  04/19/2023   IR EMBO ARTERIAL NOT HEMORR HEMANG INC GUIDE ROADMAPPING  06/07/2023   IR RADIOLOGIST EVAL & MGMT  03/08/2023   IR RADIOLOGIST EVAL & MGMT  05/20/2023   IR RADIOLOGIST EVAL & MGMT  07/19/2023   IR US  GUIDE VASC ACCESS RIGHT  04/19/2023   TUBAL LIGATION     vaginal skin tag removal     Patient Active Problem List   Diagnosis Date Noted   BMI  50.0-59.9, adult (HCC) 06/04/2023   Diabetic retinopathy (HCC) 02/26/2023   Lumbar spondylosis 07/03/2022   Lumbar spinal stenosis 02/26/2022   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/2022   Bilateral hip pain 01/12/2022   Hearing loss due to cerumen impaction, right 01/12/2022   Hammertoe, bilateral 08/23/2021   History of foot ulcer 08/23/2021   Primary osteoarthritis of both knees 12/09/2020   History of burning pain in leg 08/29/2020   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   Tremor 05/05/2020   Pre-ulcerative corn or callous 01/29/2020   History of transient ischemic attack (TIA) 05/15/2018   Family history of ovarian cancer 04/02/2017   Aortic atherosclerosis (HCC) 08/31/2016   Venous stasis 08/10/2016   Palpitations 07/16/2016   Atrial dilatation, left 07/16/2016   Atrial septal aneurysm 07/16/2016   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   Cataract 04/18/2012   Hyperlipidemia 02/13/2011   Pulmonary nodules 09/22/2010   LEG PAIN 09/12/2009   Sleep apnea 04/13/2009   GERD 03/14/2009   Morbid obesity (HCC) 12/10/2008   Essential hypertension, benign 12/10/2008   RAYNAUD'S DISEASE 12/10/2008   Osteoarthritis 12/10/2008   URINARY INCONTINENCE 12/10/2008  PCP: Alvan Dorothyann BIRCH, MD REFERRING PROVIDER: Curtis Debby PARAS,* REFERRING DIAG: M17.0 (ICD-10-CM) - Primary osteoarthritis of both kneesM47.816 (ICD-10-CM) - Lumbar spondylosisR29.898 (ICD-10-CM) - Weakness of both lower extremities  THERAPY DIAG:  Chronic pain of left knee  Other abnormalities of gait and mobility  Muscle weakness (generalized)  Other low back pain  Rationale for Evaluation and Treatment: Rehabilitation  ONSET DATE: lastest episode   SUBJECTIVE:   SUBJECTIVE STATEMENT: The patient feels her low back is flaring and she is in need of an injection in order to improve pain. She feels low back is radiating into hips and legs.  PERTINENT  HISTORY: Diabetes, HTN, TIA, spinal stenosis, polymyalgia rheumatica   PAIN:  Are you having pain? Yes: NPRS scale:7-8 7/10  Pain location: bilateral knees  Pain description: sore Aggravating factors: Raising B legs hurts the knees, trying to move after standing, pivoting to step  Relieving factors: nothing  PRECAUTIONS: Fall  WEIGHT BEARING RESTRICTIONS: No  FALLS:  Has patient fallen in last 6 months? No  LIVING ENVIRONMENT: Lives with: lives alone spouse passed away 06-17-22 Lives in: House/apartment Stairs: No Has following equipment at home: Quad cane large base and shower bench, grab bars, scooter (uses husband's scooter)  PLOF: declining mobility  PATIENT GOALS: continue to get stronger, more mobility and less comfort  OBJECTIVE:  Note: Objective measures were completed at Evaluation unless otherwise noted.  DIAGNOSTIC FINDINGS: none  PATIENT SURVEYS:  LEFS 29 / 80 = 36.3 % 01/08/14: 32/80; 40%   SENSATION: Notes neuropathy with some burning in feet, dec'd sensation  EDEMA:  Bilateral swelling noted in LEs    LOWER EXTREMITY ROM: Active ROM Right eval Left eval  Knee flexion 108 103  Knee extension 0 0   (Blank rows = not tested)  LOWER EXTREMITY MMT: MMT Right eval Left eval Right  01/09/24 Left  01/09/24  Hip flexion 4+ 4+ 5- 5-  Hip extension 5 4+    Hip abduction 4+ 4+ 5- 5-  Hip adduction      Hip internal rotation      Hip external rotation      Knee flexion 5 5    Knee extension 5 5    Ankle dorsiflexion 5 5    Ankle plantarflexion      Ankle inversion      Ankle eversion       (Blank rows = not tested)  FUNCTIONAL TESTS:  5 times sit to stand: 21 sec 2 minute walk test: 1 min 20 sec  98 ft 4/10 pain (pain went down from 6/10) Gait speed: 33 ft 29.88 seconds =  1.10 ft/sec  01/09/24: 5 times sit to stand = 26.5 sec   GAIT: Distance walked: 80 feet  Assistive device utilized: Environmental consultant - 2 wheeled Level of assistance: Modified  independence   Independent with walker Comments: forward trunk lean  [facility] Adult PT Treatment:                                                DATE: 02/10/24 Therapeutic Exercise: Supine SLR x 12 reps R and L sides Isometric knee flexion into ball Ball rolls knee to chest x 10 reps Therapeutic Activity: Sit to stand with alternating overhead reaching R and L 1# (could not tolerate 3#) Gait: Gait x 100 ft x 2 reps working on reciprocal  pattern and upright posture   [facility] Adult PT Treatment:                                                DATE: 02/06/24 Therapeutic Exercise: Seated Isometric knee extension into physioball x 8 reps x 5 second holds Marching with ball  Supine SLR x 10 reps R and L sides Bridges x 10 reps Lumbar trunk rotation x 10 reps Therapeutic Activity: Sit<>stand x 5 reps without UE support Sit<> stand x 5 reps reaching ball overhead when rising with supervision Standing with 3# overhead R and L alternating x 10 reps Seated marching with ball holds for core engagement Gait: Gait x 75 feet with cues on upright posture X 275 feet with one rest break at 130 ft with RW and cues for upright posture   [facility] Adult PT Treatment:                                                DATE: 02/04/24 Therapeutic Exercise: Supine LTR x 1 min SLR x 10 bilat Alternating marching x 10 OH reach holding physioball with cues for core activation Bridge 2 x 10 Seated Row green TB 2 x 10 March holding 2kg med ball Shoulder diagonals 1 KG med bal Seated heel/toe raises Seated hip abd/add   [facility] Adult PT Treatment:                                                DATE: 01/30/24 Therapeutic Exercise: Isometric knee flexion into physioball x 10 reps R and L Alternating marching supine x 10 reps Lumbar rotation x 1 minute with legs on physioball SLR x 10 reps R and L  Standing heel raises with UE support Therapeutic Activity: Sit to stand x 5 reps x 2 sets Standing reaching overhead  with small physioball x 5 reps Standing with 3# overhead R and L alternating x 10 reps Seated marching with ball holds for core engagement Gait: Gait mechanics working on upright posture and dec'd flexion leaning on RW x 200 ft. Gait x 150 ft with cues on upright posture Patient has narrow base of support due to glut med weakness Self Care: Discussed long term plan of introducing community exercise for a plan when therapy ends Provided handout to [facility]     PATIENT EDUCATION:  Education details: HEP review Person educated: Patient Education method: Explanation Education comprehension: verbalized understanding  HOME EXERCISE PROGRAM: Access Code: [email or web address] URL: [email or web address] Date: 12/03/2023 Prepared by: Lucie Meeter  Exercises - Supine Active Straight Leg Raise  - 1 x daily - 7 x weekly - 1 sets - 10 reps - Supine Bridge  - 2 x daily - 4 x weekly - 1 sets - 10 reps - Heel Toe Raises with Counter Support  - 2 x daily - 7 x weekly - 1 sets - 10 reps - Seated March  - 2 x daily - 7 x weekly - 1 sets - 10 reps - Sidelying hip Flexion and ABDuction  - 1 x daily - 3 x weekly -  2 sets - 10 reps  Patient Education - walking program   ASSESSMENT:  CLINICAL IMPRESSION: The patient reports pain is worse today in her back and this is limiting her mobility. PT modified activities to be appropriate based on her level of pain and reduced overall walking distance. PT to d/c this week at next visit.    Eval: Patient is a 73 y.o. female who was seen today for physical therapy evaluation and treatment for weakness in B LE and chronic B knee and back pain. She was last seen in this clinic in February. She reports some improvements since that time. She is able to get into her car easier and she is having less popping in her knees. She is working to lose weight in order to get TKR surgery, however, her MD is concerned that she is losing muscle mass. She is  limited with ambulation with her RW to 1 min and 20 sec. During that time she was able to walk 98 ft. Her LEFS 29/80. She also demonstrates B LE weakness. She will benefit from skilled PT to address these deficits.    OBJECTIVE IMPAIRMENTS: decreased activity tolerance, decreased mobility, decreased strength, postural dysfunction, obesity, and pain.   GOALS: Goals reviewed with patient? Yes  SHORT TERM GOALS: Target date: 12/17/23 Ind with initial HEP Baseline: Goal status: MET  2.  Able to complete 2 min walk test showing improved strength. Baseline: 1 min 20 sec/ 98 ft 12/03/23: 2.5 minutes, 2 laps in clinic  (160 ft) Goal status: MET  LONG TERM GOALS: Target date: 02/14/24  Patient to demonstrate improved gait speed to >=1.6 ft/sec to demo improved community ambulation status Baseline: 1.10 ft/sec 11/21/23 Goal status: ON GOING   2.  Pt able to perform 5XSTS in < = to 16 seconds demonstrating improved strength Baseline: 21 sec  01/01/24: 21.2 seconds  01/09/24 26.5 sec  Goal status: IN PROGRESS  3.  Patient able to ambulate 300 feet without a sitting break to improve community ambulation. Baseline:  Goal status: ON GOING   PLAN:  PT FREQUENCY: 2x/week  PT DURATION: 8 weeks  PLANNED INTERVENTIONS: 97164- PT Re-evaluation, 97110-Therapeutic exercises, 97530- Therapeutic activity, [id number]- Neuromuscular re-education, 97535- Self Care, [id number]- Manual therapy, [phone]- Gait training, [phone]- Aquatic Therapy, Patient/Family education, Balance training, Stair training, Joint mobilization, DME instructions, Cryotherapy, and Moist heat  PLAN FOR NEXT SESSION: work on LE strength, sit to stand, gait endurance.  Work towards d/c for 8/15--gave handout for [facility]  [place], PT 02/10/2511:11 PM

## 2024-02-12 ENCOUNTER — Ambulatory Visit: Admitting: Rehabilitative and Restorative Service Providers"

## 2024-02-12 ENCOUNTER — Encounter: Payer: Self-pay | Admitting: Rehabilitative and Restorative Service Providers"

## 2024-02-12 DIAGNOSIS — G8929 Other chronic pain: Secondary | ICD-10-CM

## 2024-02-12 DIAGNOSIS — M6281 Muscle weakness (generalized): Secondary | ICD-10-CM

## 2024-02-12 DIAGNOSIS — M25562 Pain in left knee: Secondary | ICD-10-CM | POA: Diagnosis not present

## 2024-02-12 DIAGNOSIS — M5459 Other low back pain: Secondary | ICD-10-CM | POA: Diagnosis not present

## 2024-02-12 DIAGNOSIS — R2689 Other abnormalities of gait and mobility: Secondary | ICD-10-CM | POA: Diagnosis not present

## 2024-02-12 DIAGNOSIS — M25561 Pain in right knee: Secondary | ICD-10-CM | POA: Diagnosis not present

## 2024-02-12 NOTE — Therapy (Signed)
 OUTPATIENT PHYSICAL THERAPY LOWER EXTREMITY TREATMENT  AND DISCHARGE SUMMARY  Patient Name: Natalie Wright MRN: [id number] DOB:24-Nov-1950, 73 y.o., female Today's Date: 02/12/2024  PHYSICAL THERAPY DISCHARGE SUMMARY  Visits from Start of Care: 20  Current functional level related to goals / functional outcomes: See below.   Remaining deficits: Dec'd endurance for extended gait LBP, chronic in nature Bilat knee pain, chronic in nature Dec'd strength   Education / Equipment: Patient has HEP for post d/c activities. Community exercise recommendations also provided.    Patient agrees to discharge. Patient goals were partially met. Patient is being discharged due to meeting the stated rehab goals.  END OF SESSION:  PT End of Session - 02/12/24 1107     Visit Number 20    Number of Visits 30   reviewed notes-- recert on 7/10 and can update total #   Date for PT Re-Evaluation 02/14/24    Authorization Type medicare and tricare    Progress Note Due on Visit 20    PT Start Time 1105    PT Stop Time 1145    PT Time Calculation (min) 40 min    Activity Tolerance Patient tolerated treatment well    Behavior During Therapy WFL for tasks assessed/performed          Past Medical History:  Diagnosis Date   Allergy    to cats and dogs   Anemia    Arthritis    Cataract    Diabetes mellitus    GERD (gastroesophageal reflux disease)    Glaucoma    Hiatal hernia    and delayed gastric emptying/ sees [facility] for recurrent diarrhea   History of cardiovascular disorder 12/10/2008   Qualifier: Diagnosis of  By: Alvan MD, Catherine     Hypertension    OSA (obstructive sleep apnea)    CPAP   Pedal edema    Sleep apnea    Spastic bladder    urology in [facility]   TIA (transient ischemic attack)    hx of- sees Dr Robin (Neurology)   Past Surgical History:  Procedure Laterality Date   CATARACT EXTRACTION  08/2021   IR ANGIOGRAM SELECTIVE EACH ADDITIONAL VESSEL   04/19/2023   IR ANGIOGRAM SELECTIVE EACH ADDITIONAL VESSEL  04/19/2023   IR ANGIOGRAM SELECTIVE EACH ADDITIONAL VESSEL  04/19/2023   IR EMBO ARTERIAL NOT HEMORR HEMANG INC GUIDE ROADMAPPING  04/19/2023   IR EMBO ARTERIAL NOT HEMORR HEMANG INC GUIDE ROADMAPPING  06/07/2023   IR RADIOLOGIST EVAL & MGMT  03/08/2023   IR RADIOLOGIST EVAL & MGMT  05/20/2023   IR RADIOLOGIST EVAL & MGMT  07/19/2023   IR US  GUIDE VASC ACCESS RIGHT  04/19/2023   TUBAL LIGATION     vaginal skin tag removal     Patient Active Problem List   Diagnosis Date Noted   BMI 50.0-59.9, adult (HCC) 06/04/2023   Diabetic retinopathy (HCC) 02/26/2023   Lumbar spondylosis 07/03/2022   Lumbar spinal stenosis 02/26/2022   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/2022   Bilateral hip pain 01/12/2022   Hearing loss due to cerumen impaction, right 01/12/2022   Hammertoe, bilateral 08/23/2021   History of foot ulcer 08/23/2021   Primary osteoarthritis of both knees 12/09/2020   History of burning pain in leg 08/29/2020   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   Tremor 05/05/2020   Pre-ulcerative corn or callous 01/29/2020   History of transient ischemic attack (TIA) 05/15/2018   Family history of ovarian cancer  04/02/2017   Aortic atherosclerosis (HCC) 08/31/2016   Venous stasis 08/10/2016   Palpitations 07/16/2016   Atrial dilatation, left 07/16/2016   Atrial septal aneurysm 07/16/2016   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   Cataract 04/18/2012   Hyperlipidemia 02/13/2011   Pulmonary nodules 09/22/2010   LEG PAIN 09/12/2009   Sleep apnea 04/13/2009   GERD 03/14/2009   Morbid obesity (HCC) 12/10/2008   Essential hypertension, benign 12/10/2008   RAYNAUD'S DISEASE 12/10/2008   Osteoarthritis 12/10/2008   URINARY INCONTINENCE 12/10/2008    PCP: Alvan Dorothyann BIRCH, MD REFERRING PROVIDER: Curtis Debby PARAS,* REFERRING DIAG: M17.0 (ICD-10-CM) - Primary osteoarthritis of both  kneesM47.816 (ICD-10-CM) - Lumbar spondylosisR29.898 (ICD-10-CM) - Weakness of both lower extremities  THERAPY DIAG:  Chronic pain of left knee  Other abnormalities of gait and mobility  Muscle weakness (generalized)  Other low back pain  Rationale for Evaluation and Treatment: Rehabilitation  ONSET DATE: lastest episode   SUBJECTIVE:   SUBJECTIVE STATEMENT: The patient reports she got an injection scheduled for next Friday for her low back. She notes soreness has been worse and knees hurt worse as back pain worsens. She notes she has not been able to stand to prep food and for IADLs due to increased pain.   PERTINENT HISTORY: Diabetes, HTN, TIA, spinal stenosis, polymyalgia rheumatica   PAIN:  Are you having pain? Yes: NPRS scale: 7/10 in knees and back  Pain location: bilateral knees  Pain description: sore Aggravating factors: Raising B legs hurts the knees, trying to move after standing, pivoting to step  Relieving factors: nothing  PRECAUTIONS: Fall  WEIGHT BEARING RESTRICTIONS: No  FALLS:  Has patient fallen in last 6 months? No  LIVING ENVIRONMENT: Lives with: lives alone spouse passed away 05-16-22 Lives in: House/apartment Stairs: No Has following equipment at home: Quad cane large base and shower bench, grab bars, scooter (uses husband's scooter)  PLOF: declining mobility  PATIENT GOALS: continue to get stronger, more mobility and less comfort  OBJECTIVE:  Note: Objective measures were completed at Evaluation unless otherwise noted.  DIAGNOSTIC FINDINGS: none  PATIENT SURVEYS:  LEFS 29 / 80 = 36.3 % 01/08/14: 32/80; 40%   SENSATION: Notes neuropathy with some burning in feet, dec'd sensation  EDEMA:  Bilateral swelling noted in LEs    LOWER EXTREMITY ROM: Active ROM Right eval Left eval  Knee flexion 108 103  Knee extension 0 0   (Blank rows = not tested)  LOWER EXTREMITY MMT: MMT Right eval Left eval Right  01/09/24 Left  01/09/24   Hip flexion 4+ 4+ 5- 5-  Hip extension 5 4+    Hip abduction 4+ 4+ 5- 5-  Hip adduction      Hip internal rotation      Hip external rotation      Knee flexion 5 5    Knee extension 5 5    Ankle dorsiflexion 5 5    Ankle plantarflexion      Ankle inversion      Ankle eversion       (Blank rows = not tested)  FUNCTIONAL TESTS:  5 times sit to stand: 21 sec 2 minute walk test: 1 min 20 sec  98 ft 4/10 pain (pain went down from 6/10) Gait speed: 33 ft 29.88 seconds =  1.10 ft/sec  01/09/24: 5 times sit to stand = 26.5 sec   GAIT: Distance walked: 80 feet  Assistive device utilized: Walker - 2 wheeled Level of assistance: Modified independence   Independent with  walker Comments: forward trunk lean  [facility] Adult PT Treatment:                                                DATE: 02/12/24 Therapeutic Exercise: Reviewed HEP adding SLR sidelying for hip abduction today Standing heel/toe raises x 10 reps Therapeutic Activity: 5 time sit<>stand Sit <>stand x 5 reps Gait: Gait ambulating 325 feet with one seated rest break x 3 minutes due to fatigue and pain using RW mod indep Self Care: Discussed post d/c HEP working on progressing walking distance, strength through HEP, and increasing participation in community exercise.   [facility] Adult PT Treatment:                                                DATE: 02/10/24 Therapeutic Exercise: Supine SLR x 12 reps R and L sides Isometric knee flexion into ball Ball rolls knee to chest x 10 reps Therapeutic Activity: Sit to stand with alternating overhead reaching R and L 1# (could not tolerate 3#) Gait: Gait x 100 ft x 2 reps working on reciprocal pattern and upright posture   [facility] Adult PT Treatment:                                                DATE: 02/06/24 Therapeutic Exercise: Seated Isometric knee extension into physioball x 8 reps x 5 second holds Marching with ball  Supine SLR x 10 reps R and L sides Bridges x 10 reps Lumbar  trunk rotation x 10 reps Therapeutic Activity: Sit<>stand x 5 reps without UE support Sit<> stand x 5 reps reaching ball overhead when rising with supervision Standing with 3# overhead R and L alternating x 10 reps Seated marching with ball holds for core engagement Gait: Gait x 75 feet with cues on upright posture X 275 feet with one rest break at 130 ft with RW and cues for upright posture   [facility] Adult PT Treatment:                                                DATE: 02/04/24 Therapeutic Exercise: Supine LTR x 1 min SLR x 10 bilat Alternating marching x 10 OH reach holding physioball with cues for core activation Bridge 2 x 10 Seated Row green TB 2 x 10 March holding 2kg med ball Shoulder diagonals 1 KG med bal Seated heel/toe raises Seated hip abd/add   [facility] Adult PT Treatment:                                                DATE: 01/30/24 Therapeutic Exercise: Isometric knee flexion into physioball x 10 reps R and L Alternating marching supine x 10 reps Lumbar rotation x 1 minute with legs on physioball SLR x 10 reps R and L  Standing heel  raises with UE support Therapeutic Activity: Sit to stand x 5 reps x 2 sets Standing reaching overhead with small physioball x 5 reps Standing with 3# overhead R and L alternating x 10 reps Seated marching with ball holds for core engagement Gait: Gait mechanics working on upright posture and dec'd flexion leaning on RW x 200 ft. Gait x 150 ft with cues on upright posture Patient has narrow base of support due to glut med weakness Self Care: Discussed long term plan of introducing community exercise for a plan when therapy ends Provided handout to [facility]     PATIENT EDUCATION:  Education details: HEP review Person educated: Patient Education method: Explanation Education comprehension: verbalized understanding  HOME EXERCISE PROGRAM: Access Code: [email or web address] URL: [email or web address] Date:  02/12/2024 Prepared by: Tawni Ferrier  Exercises - Supine Active Straight Leg Raise  - 1 x daily - 7 x weekly - 1 sets - 10 reps - Supine Bridge  - 1 x daily - 4 x weekly - 1 sets - 10 reps - Sidelying Hip Abduction  - 1 x daily - 4 x weekly - 1 sets - 10 reps - Seated March  - 1 x daily - 4 x weekly - 1 sets - 10 reps - Sit to Stand with Armchair  - 1 x daily - 4 x weekly - 1 sets - 10 reps - Heel Toe Raises with Counter Support  - 1 x daily - 4 x weekly - 1 sets - 10 reps  Patient Education - walking program   ASSESSMENT:  CLINICAL IMPRESSION: The patient has partially met goals. PT recommended she continue with HEP and community classes (at [facility]) to continue strengthening to tolerance. We also discussed continuing to progress walking steps once her increased pain is medically addressed (she has scheduled intervention for next week).    Eval: Patient is a 73 y.o. female who was seen today for physical therapy evaluation and treatment for weakness in B LE and chronic B knee and back pain. She was last seen in this clinic in February. She reports some improvements since that time. She is able to get into her car easier and she is having less popping in her knees. She is working to lose weight in order to get TKR surgery, however, her MD is concerned that she is losing muscle mass. She is limited with ambulation with her RW to 1 min and 20 sec. During that time she was able to walk 98 ft. Her LEFS 29/80. She also demonstrates B LE weakness. She will benefit from skilled PT to address these deficits.    OBJECTIVE IMPAIRMENTS: decreased activity tolerance, decreased mobility, decreased strength, postural dysfunction, obesity, and pain.   GOALS: Goals reviewed with patient? Yes  SHORT TERM GOALS: Target date: 12/17/23 Ind with initial HEP Baseline: Goal status: MET  2.  Able to complete 2 min walk test showing improved strength. Baseline: 1 min 20 sec/ 98 ft 12/03/23: 2.5  minutes, 2 laps in clinic  (160 ft) Goal status: MET  LONG TERM GOALS: Target date: 02/14/24  Patient to demonstrate improved gait speed to >=1.6 ft/sec to demo improved community ambulation status Baseline: 1.10 ft/sec 11/21/23 Goal status: PARTIALLY MET 1.5 ft/sec  2.  Pt able to perform 5XSTS in < = to 16 seconds demonstrating improved strength Baseline: 21 sec  01/01/24: 21.2 seconds  01/09/24 26.5 sec  Goal status: NOT MET-- 24.05 on 02/12/24-- patient has had increased pain this month and  is more guarded with movement.  3.  Patient able to ambulate 300 feet without a sitting break to improve community ambulation. Baseline:  Goal status: PARTIALLY MET  needs 1 rest break  PLAN:  PT FREQUENCY: 2x/week  PT DURATION: 8 weeks  PLANNED INTERVENTIONS: 97164- PT Re-evaluation, 97110-Therapeutic exercises, 97530- Therapeutic activity, [id number]- Neuromuscular re-education, 97535- Self Care, 02859- Manual therapy, [phone]- Gait training, [phone]- Aquatic Therapy, Patient/Family education, Balance training, Stair training, Joint mobilization, DME instructions, Cryotherapy, and Moist heat  PLAN FOR NEXT SESSION: discharge.  Rheanne Cortopassi, PT 08/13/252:34 PM

## 2024-02-13 ENCOUNTER — Encounter: Admitting: Rehabilitative and Restorative Service Providers"

## 2024-02-13 ENCOUNTER — Ambulatory Visit
Admission: EM | Admit: 2024-02-13 | Discharge: 2024-02-13 | Disposition: A | Discharge status: Home / Self Care | Attending: Family Medicine | Admitting: Family Medicine

## 2024-02-13 ENCOUNTER — Ambulatory Visit (INDEPENDENT_AMBULATORY_CARE_PROVIDER_SITE_OTHER)

## 2024-02-13 DIAGNOSIS — S52202A Unspecified fracture of shaft of left ulna, initial encounter for closed fracture: Secondary | ICD-10-CM | POA: Diagnosis not present

## 2024-02-13 DIAGNOSIS — S52022A Displaced fracture of olecranon process without intraarticular extension of left ulna, initial encounter for closed fracture: Secondary | ICD-10-CM | POA: Diagnosis not present

## 2024-02-13 DIAGNOSIS — M25522 Pain in left elbow: Secondary | ICD-10-CM

## 2024-02-13 DIAGNOSIS — S4992XA Unspecified injury of left shoulder and upper arm, initial encounter: Secondary | ICD-10-CM | POA: Diagnosis not present

## 2024-02-13 MED ORDER — OXYCODONE-ACETAMINOPHEN 5-325 MG PO TABS: 1.0000 | ORAL_TABLET | Freq: Three times a day (TID) | ORAL | 0 refills | Status: DC | PRN

## 2024-02-13 NOTE — ED Triage Notes (Signed)
 Pt presents to [facility] with family (SIL) pt reports she was outside this morning around 10 am hurting her left arm. Pt reports she was able to get into the house to call her sil and the fire department to help her into the car to come here. Difficulty walking at baseline (uses cane and wheelchair at baseline).

## 2024-02-13 NOTE — Discharge Instructions (Addendum)
 Advised patient of left humerus and left elbow x-ray results today with hardcopy and image provided.  Advised patient to maintain left elbow position in sling 24/7 until being evaluated by [facility] orthopedics.  Advised patient may RICE affected area of left elbow for 30 minutes 3 times daily for the next 3 days.  Advised may take Percocet daily or as needed for left elbow pain.  Patient advised of sedative effects of this medication.  Encouraged increase daily water intake to 64 ounces per day while taking this medication.  Advised if symptoms worsen and/or unresolved please follow-up with [facility] orthopedics, PCP, or here for further evaluation.

## 2024-02-13 NOTE — ED Provider Notes (Signed)
 Natalie Wright CARE    CSN: [id number] Arrival date & time: 02/13/24  1306      History   Chief Complaint Chief Complaint  Patient presents with   Arm Injury    HPI Natalie Wright is a 73 y.o. female.   HPI very pleasant 73 year old female presents left upper arm/left elbow pain secondary to fall that occurred at 10 AM this morning.  Patient is accompanied by family and reports calling fire department earlier to home for assistance with this fall.  PMH significant for morbid obesity, T2DM, and TIA.  Patient is currently on Plavix  and denies any unusual bleeding.  Patient reports using walker and wheelchair at baseline.  Past Medical History:  Diagnosis Date   Allergy    to cats and dogs   Anemia    Arthritis    Cataract    Diabetes mellitus    GERD (gastroesophageal reflux disease)    Glaucoma    Hiatal hernia    and delayed gastric emptying/ sees [facility] for recurrent diarrhea   History of cardiovascular disorder 12/10/2008   Qualifier: Diagnosis of  By: Alvan MD, Catherine     Hypertension    OSA (obstructive sleep apnea)    CPAP   Pedal edema    Sleep apnea    Spastic bladder    urology in [place]   TIA (transient ischemic attack)    hx of- sees Dr Robin (Neurology)    Patient Active Problem List   Diagnosis Date Noted   BMI 50.0-59.9, adult (HCC) 06/04/2023   Diabetic retinopathy (HCC) 02/26/2023   Lumbar spondylosis 07/03/2022   Lumbar spinal stenosis 02/26/2022   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/2022   Bilateral hip pain 01/12/2022   Hearing loss due to cerumen impaction, right 01/12/2022   Hammertoe, bilateral 08/23/2021   History of foot ulcer 08/23/2021   Primary osteoarthritis of both knees 12/09/2020   History of burning pain in leg 08/29/2020   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   Tremor 05/05/2020   Pre-ulcerative corn or callous 01/29/2020   History of transient ischemic attack (TIA)  05/15/2018   Family history of ovarian cancer 04/02/2017   Aortic atherosclerosis (HCC) 08/31/2016   Venous stasis 08/10/2016   Palpitations 07/16/2016   Atrial dilatation, left 07/16/2016   Atrial septal aneurysm 07/16/2016   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   Cataract 04/18/2012   Hyperlipidemia 02/13/2011   Pulmonary nodules 09/22/2010   LEG PAIN 09/12/2009   Sleep apnea 04/13/2009   GERD 03/14/2009   Morbid obesity (HCC) 12/10/2008   Essential hypertension, benign 12/10/2008   RAYNAUD'S DISEASE 12/10/2008   Osteoarthritis 12/10/2008   URINARY INCONTINENCE 12/10/2008    Past Surgical History:  Procedure Laterality Date   CATARACT EXTRACTION  08/2021   IR ANGIOGRAM SELECTIVE EACH ADDITIONAL VESSEL  04/19/2023   IR ANGIOGRAM SELECTIVE EACH ADDITIONAL VESSEL  04/19/2023   IR ANGIOGRAM SELECTIVE EACH ADDITIONAL VESSEL  04/19/2023   IR EMBO ARTERIAL NOT HEMORR HEMANG INC GUIDE ROADMAPPING  04/19/2023   IR EMBO ARTERIAL NOT HEMORR HEMANG INC GUIDE ROADMAPPING  06/07/2023   IR RADIOLOGIST EVAL & MGMT  03/08/2023   IR RADIOLOGIST EVAL & MGMT  05/20/2023   IR RADIOLOGIST EVAL & MGMT  07/19/2023   IR US  GUIDE VASC ACCESS RIGHT  04/19/2023   TUBAL LIGATION     vaginal skin tag removal      OB History     Gravida  4   Para  2   Term  2   Preterm      AB  1   Living         SAB  1   IAB      Ectopic      Multiple      Live Births               Home Medications    Prior to Admission medications   Medication Sig Start Date End Date Taking? Authorizing Provider  AMBULATORY NON FORMULARY MEDICATION Medication Name: custom fit knee high compression stocking with 15-20 mmHg pressure. Open toe preferred. 08/10/16   Alvan Dorothyann BIRCH, MD  aspirin 325 MG tablet Take 325 mg by mouth daily.    [provider]  AZELAIC ACID EX Apply topically.    [provider]  cetirizine  (ZYRTEC ) 10 MG tablet Take 10 mg by mouth daily.    [provider]  chlorthalidone  (HYGROTON ) 25 MG tablet Take 1 tablet (25 mg total) by mouth daily. 07/19/23   Alvan Dorothyann BIRCH, MD  clopidogrel  (PLAVIX ) 75 MG tablet TAKE 1 TABLET DAILY 10/25/23   Alvan Dorothyann BIRCH, MD  Continuous Glucose Receiver (DEXCOM G7 RECEIVER) DEVI Use for continuous glucose monitoring 02/07/24   Alvan Dorothyann BIRCH, MD  Continuous Glucose Sensor (DEXCOM G7 SENSOR) MISC Change sensor every 10 days Patient not taking: Reported on 02/07/2024 06/06/23   Alvan Dorothyann BIRCH, MD  diltiazem  (CARDIZEM  CD) 360 MG 24 hr capsule Take 1 capsule (360 mg total) by mouth daily. 07/09/23   Alvan Dorothyann BIRCH, MD  DULoxetine  (CYMBALTA ) 30 MG capsule TAKE 1 CAPSULE DAILY FOR MUSCULOSKELETAL PAIN 06/21/23   Alvan Dorothyann BIRCH, MD  Ferrous Sulfate Dried 200 (65 Fe) MG TABS Take 1 tablet by mouth daily.    [provider]  finasteride (PROPECIA) 1 MG tablet Take 1 mg by mouth daily.    [provider]  fluconazole  (DIFLUCAN ) 150 MG tablet TAKE 1 TABLET ONCE AS DIRECTED 07/05/23   Alvan Dorothyann BIRCH, MD  insulin  glargine (LANTUS ) 100 UNIT/ML injection INJECT 35 UNITS UNDER THE SKIN DAILY 02/10/24   Alvan Dorothyann BIRCH, MD  Insulin  Pen Needle (NOVOFINE) 30G X 8 MM MISC For use each time injection for insulin  02/10/16   Alvan Dorothyann BIRCH, MD  JARDIANCE  25 MG TABS tablet TAKE 1 TABLET DAILY 04/08/23   Alvan Dorothyann BIRCH, MD  latanoprost (XALATAN) 0.005 % ophthalmic solution 1 drop.    [provider]  lisinopril  (ZESTRIL ) 20 MG tablet TAKE 1 TABLET DAILY 07/05/23   Alvan Dorothyann BIRCH, MD  meloxicam  (MOBIC ) 15 MG tablet TAKE 1 TABLET DAILY AS NEEDED 11/04/23   Curtis Debby PARAS, MD  metFORMIN  (GLUCOPHAGE ) 1000 MG tablet Take 1 tablet (1,000 mg total) by mouth 2 (two) times daily with a meal. 01/24/23   Alvan Dorothyann BIRCH, MD  Misc. Devices MISC Transfer of care to [facility].  Please provide service to her current CPAP machine for OSA at 14 cm. water pressure.   Send directly to Stanley at [facility]. 05/15/19   [provider]  NIACINAMIDE EX Apply topically.    [provider]  nystatin cream (MYCOSTATIN) Apply 1 Application topically 2 (two) times daily.    [provider]  nystatin powder Apply 1 Application topically 3 (three) times daily.    [provider]  Omega-3 Fatty Acids (FISH OIL) 1200 MG CAPS Take 1 capsule by mouth daily.    [provider]  oxyCODONE -acetaminophen  (PERCOCET/ROXICET)  5-325 MG tablet Take 1 tablet by mouth every 8 (eight) hours as needed for severe pain (pain score 7-10). 02/13/24  Yes Teddy Sharper, FNP  pantoprazole  (PROTONIX ) 40 MG tablet TAKE 1 TABLET TWICE A DAY 10/25/23   Alvan Dorothyann BIRCH, MD  rosuvastatin  (CRESTOR ) 10 MG tablet TAKE 1 TABLET ONCE A WEEK AT BEDTIME 07/05/23   Alvan Dorothyann BIRCH, MD  tirzepatide  (MOUNJARO ) 12.5 MG/0.5ML Pen INJECT 12.5 MG SUBCUTANEOUSLY ONE TIME PER WEEK 02/10/24   Alvan Dorothyann BIRCH, MD  traMADol  (ULTRAM ) 50 MG tablet Take 1 tablet (50 mg total) by mouth daily as needed for moderate pain (pain score 4-6). 10/24/23   Curtis Debby PARAS, MD  Tranexamic Acid POWD by Does not apply route.    [provider]  triamcinolone  cream (KENALOG ) 0.5 % Apply 1 Application topically 3 (three) times daily.    [provider]    Family History Family History  Problem Relation Age of Onset   Diabetes Mother    Hypertension Mother    Glaucoma Mother    Cancer Mother        bone cancer   Alcohol abuse Father    Emphysema Father    Other Father        CHF   Ovarian cancer Sister    Endometrial cancer Sister        Endometrial stromal sarcoma    Social History Social History   Tobacco Use   Smoking status: Former    Types: Cigarettes   Smokeless tobacco: Never  Vaping Use   Vaping status: Never Used  Substance Use Topics   Alcohol use: Yes    Alcohol/week: 1.0 standard drink of alcohol    Types: 1 Glasses of wine  per week    Comment: social   Drug use: No     Allergies   Atorvastatin, Beclomethasone, Cefaclor, Pioglitazone, Pravastatin , Sulfa antibiotics, Cephalexin, Lipitor [atorvastatin calcium ], and Statins   Review of Systems Review of Systems  Musculoskeletal:        Left upper arm/elbow pain [place] to fall at home this morning at 10 AM     Physical Exam Triage Vital Signs ED Triage Vitals  Encounter Vitals Group     BP      Girls Systolic BP Percentile      Girls Diastolic BP Percentile      Boys Systolic BP Percentile      Boys Diastolic BP Percentile      Pulse      Resp      Temp      Temp src      SpO2      Weight      Height      Head Circumference      Peak Flow      Pain Score      Pain Loc      Pain Education      Exclude from Growth Chart    No data found.  Updated Vital Signs BP (!) 126/57   Pulse 71   Temp 98.2 F (36.8 C)   Resp 19   SpO2 98%    Physical Exam Vitals and nursing note reviewed.  Constitutional:      Appearance: Normal appearance. She is obese.  HENT:     Head: Normocephalic and atraumatic.     Mouth/Throat:     Mouth: Mucous membranes are moist.     Pharynx: Oropharynx is clear.  Eyes:     Extraocular  Movements: Extraocular movements intact.     Conjunctiva/sclera: Conjunctivae normal.     Pupils: Pupils are equal, round, and reactive to light.  Cardiovascular:     Pulses: Normal pulses.     Heart sounds: Normal heart sounds.  Pulmonary:     Effort: Pulmonary effort is normal.     Breath sounds: Normal breath sounds. No wheezing, rhonchi or rales.  Musculoskeletal:        General: Normal range of motion.     Comments: Left elbow: TTP over lateral olecranon exam limited due to pain today  Skin:    General: Skin is warm and dry.  Neurological:     General: No focal deficit present.     Mental Status: She is alert and oriented to person, place, and time. Mental status is at baseline.  Psychiatric:        Mood and  Affect: Mood normal.        Behavior: Behavior normal.      UC Treatments / Results  Labs (all labs ordered are listed, but only abnormal results are displayed) Labs Reviewed - No data to display  EKG   Radiology DG Elbow Complete Left Result Date: 02/13/2024 CLINICAL DATA:  LEFT arm pain after fall. EXAM: LEFT ELBOW - COMPLETE 3+ VIEW COMPARISON:  None Available. FINDINGS: Subtle nondisplaced fracture of the ulna olecranon. Fracture enters the articular surface of the semi lunar notch. No effusion evident. No dislocation IMPRESSION: Subtle nondisplaced intra-articular fracture of the ulna olecranon Electronically Signed   By: Jackquline Boxer M.D.   On: 02/13/2024 14:05   DG Humerus Left Result Date: 02/13/2024 CLINICAL DATA:  Left elbow pain after fall. EXAM: LEFT HUMERUS - 2+ VIEW COMPARISON:  None Available. FINDINGS: There is no evidence of fracture or other focal bone lesions. Soft tissues are unremarkable. IMPRESSION: Negative. Electronically Signed   By: Lynwood Landy Raddle M.D.   On: 02/13/2024 14:05    Procedures Procedures (including critical care time)  Medications Ordered in UC Medications - No data to display  Initial Impression / Assessment and Plan / UC Course  I have reviewed the triage vital signs and the nursing notes.  Pertinent labs & imaging results that were available during my care of the patient were reviewed by me and considered in my medical decision making (see chart for details).     MDM: 1.  Closed fracture of olecranon process of left ulna, initial encounter-Ace wrap and left shoulder sling placed on patient prior to discharge, Rx'd oxycodone  5/325 mg tablet: Take 1 tablet every 8 hours for left elbow pain. 2.  Injury of left upper arm, initial encounter-patient reports fall at her home at 10 AM this morning getting entangled with her dog's leash. Advised patient of left humerus and left elbow x-ray results today with hardcopy and image provided.   Advised patient to maintain left elbow position in sling 24/7 until being evaluated by [facility] orthopedics.  Advised patient may RICE affected area of left elbow for 30 minutes 3 times daily for the next 3 days.  Advised may take Percocet daily or as needed for left elbow pain.  Patient advised of sedative effects of this medication.  Encouraged increase daily water intake to 64 ounces per day while taking this medication.  Advised if symptoms worsen and/or unresolved please follow-up with [facility] orthopedics, PCP, or here for further evaluation.  Patient discharged home, hemodynamically stable. Final Clinical Impressions(s) / UC Diagnoses   Final diagnoses:  Closed fracture of olecranon process of left ulna, initial encounter  Injury of left upper arm, initial encounter     Discharge Instructions      Advised patient of left humerus and left elbow x-ray results today with hardcopy and image provided.  Advised patient to maintain left elbow position in sling 24/7 until being evaluated by [facility] orthopedics.  Advised patient may RICE affected area of left elbow for 30 minutes 3 times daily for the next 3 days.  Advised may take Percocet daily or as needed for left elbow pain.  Patient advised of sedative effects of this medication.  Encouraged increase daily water intake to 64 ounces per day while taking this medication.  Advised if symptoms worsen and/or unresolved please follow-up with [facility] orthopedics, PCP, or here for further evaluation.     ED Prescriptions     Medication Sig Dispense Auth. Provider   oxyCODONE -acetaminophen  (PERCOCET/ROXICET) 5-325 MG tablet Take 1 tablet by mouth every 8 (eight) hours as needed for severe pain (pain score 7-10). 15 tablet Estephany Perot, FNP      I have reviewed the PDMP during this encounter.   Teddy Sharper, FNP 02/13/24 1435

## 2024-02-17 ENCOUNTER — Telehealth: Payer: Self-pay

## 2024-02-17 NOTE — Progress Notes (Signed)
   02/17/2024  Patient ID: Natalie Wright, female   DOB: [birth year], 73 y.o.   MRN: [id number]  Patient outreach to follow-up on access to [facility] G7 Receiver.  Patient states she recently refilled her Lantus  and received notification that the Dexcom Receiver is also now going through on insurance.  She endorses verifying with [facility] that these prescriptions are in process for her.  Patient will contact me if any further medication needs arise.  [name], PharmD, DPLA

## 2024-02-19 ENCOUNTER — Ambulatory Visit (INDEPENDENT_AMBULATORY_CARE_PROVIDER_SITE_OTHER): Admitting: Sports Medicine

## 2024-02-19 ENCOUNTER — Ambulatory Visit (INDEPENDENT_AMBULATORY_CARE_PROVIDER_SITE_OTHER)

## 2024-02-19 DIAGNOSIS — M778 Other enthesopathies, not elsewhere classified: Secondary | ICD-10-CM | POA: Diagnosis not present

## 2024-02-19 DIAGNOSIS — S42402D Unspecified fracture of lower end of left humerus, subsequent encounter for fracture with routine healing: Secondary | ICD-10-CM | POA: Diagnosis not present

## 2024-02-19 DIAGNOSIS — S52022A Displaced fracture of olecranon process without intraarticular extension of left ulna, initial encounter for closed fracture: Secondary | ICD-10-CM | POA: Diagnosis not present

## 2024-02-19 MED ORDER — HYDROCODONE-ACETAMINOPHEN 5-325 MG PO TABS: 1.0000 | ORAL_TABLET | Freq: Three times a day (TID) | ORAL | 0 refills | Status: DC | PRN

## 2024-02-19 NOTE — Assessment & Plan Note (Signed)
 1 week status post accidental fall with fracture through the olecranon, minimal tenderness. Today we placed her in a posterior slab splint, she will need a larger sling, hydrocodone  for pain. She will need to delay her lumbar epidural so we will control her back pain with hydrocodone  for now.

## 2024-02-19 NOTE — Discharge Instructions (Signed)
 Post Procedure Spinal Discharge Instruction Sheet  You may resume a regular diet and any medications that you routinely take (including pain medications) unless otherwise noted by MD.  No driving day of procedure.  Light activity throughout the rest of the day.  Do not do any strenuous work, exercise, bending or lifting.  The day following the procedure, you can resume normal physical activity but you should refrain from exercising or physical therapy for at least three days thereafter.  You may apply ice to the injection site, 20 minutes on, 20 minutes off, as needed. Do not apply ice directly to skin.    Common Side Effects:  Headaches- take your usual medications as directed by your physician.  Increase your fluid intake.  Caffeinated beverages may be helpful.  Lie flat in bed until your headache resolves.  Restlessness or inability to sleep- you may have trouble sleeping for the next few days.  Ask your referring physician if you need any medication for sleep.  Facial flushing or redness- should subside within a few days.  Increased pain- a temporary increase in pain a day or two following your procedure is not unusual.  Take your pain medication as prescribed by your referring physician.  Leg cramps  Please contact our office at [phone] for the following symptoms: Fever greater than 100 degrees. Headaches unresolved with medication after 2-3 days. Increased swelling, pain, or redness at injection site.   Thank you for visiting [facility] today.   YOU MAY RESUME YOUR ASPIRIN AND PLAVIX TODAY.

## 2024-02-19 NOTE — Progress Notes (Signed)
    Procedures performed today:    None.  Independent interpretation of notes and tests performed by another provider:   X-rays personally viewed, there is a nondisplaced fracture through the olecranon  Brief History, Exam, Impression, and Recommendations:    Fracture, olecranon, left, closed, initial encounter 1 week status post accidental fall with fracture through the olecranon, minimal tenderness. Today we placed her in a posterior slab splint, she will need a larger sling, hydrocodone  for pain. She will need to delay her lumbar epidural so we will control her back pain with hydrocodone  for now.    ____________________________________________ Debby PARAS. Curtis, M.D., ABFM., CAQSM., AME. Primary Care and Sports Medicine [facility]  Adjunct Professor of [facility] Medicine  [employer]  Restaurant manager, fast food

## 2024-02-21 ENCOUNTER — Inpatient Hospital Stay
Admission: RE | Admit: 2024-02-21 | Discharge: 2024-02-21 | Disposition: A | Discharge status: Home / Self Care | Source: Ambulatory Visit | Attending: Sports Medicine | Admitting: Sports Medicine

## 2024-02-25 ENCOUNTER — Ambulatory Visit: Admitting: Family Medicine

## 2024-02-25 ENCOUNTER — Encounter: Payer: Self-pay | Admitting: Family Medicine

## 2024-02-25 VITALS — BP 120/48 | HR 70 | Ht 61.0 in | Wt 264.0 lb

## 2024-02-25 DIAGNOSIS — S52022A Displaced fracture of olecranon process without intraarticular extension of left ulna, initial encounter for closed fracture: Secondary | ICD-10-CM

## 2024-02-25 DIAGNOSIS — M2550 Pain in unspecified joint: Secondary | ICD-10-CM

## 2024-02-25 DIAGNOSIS — M5416 Radiculopathy, lumbar region: Secondary | ICD-10-CM

## 2024-02-25 DIAGNOSIS — Z7984 Long term (current) use of oral hypoglycemic drugs: Secondary | ICD-10-CM | POA: Diagnosis not present

## 2024-02-25 DIAGNOSIS — E1149 Type 2 diabetes mellitus with other diabetic neurological complication: Secondary | ICD-10-CM

## 2024-02-25 LAB — POCT GLYCOSYLATED HEMOGLOBIN (HGB A1C): Hemoglobin A1C: 8.4 % — AB (ref 4.0–5.6)

## 2024-02-25 MED ORDER — DILTIAZEM HCL ER COATED BEADS 360 MG PO CP24: 360.0000 mg | ORAL_CAPSULE | Freq: Every day | ORAL | 1 refills | Status: AC

## 2024-02-25 MED ORDER — FREESTYLE LIBRE 3 PLUS SENSOR MISC: 1 refills | Status: AC

## 2024-02-25 MED ORDER — DULOXETINE HCL 30 MG PO CPEP
30.0000 mg | ORAL_CAPSULE | Freq: Every day | ORAL | 3 refills | Status: DC
Start: 2024-02-25 — End: 2024-04-02

## 2024-02-25 MED ORDER — TIRZEPATIDE 15 MG/0.5ML ~~LOC~~ SOAJ
15.0000 mg | SUBCUTANEOUS | 1 refills | Status: AC
Start: 2024-02-25 — End: ?

## 2024-02-25 MED ORDER — METFORMIN HCL 1000 MG PO TABS: 1000.0000 mg | ORAL_TABLET | Freq: Two times a day (BID) | ORAL | 1 refills | Status: DC

## 2024-02-25 NOTE — Progress Notes (Signed)
 Established Patient Office Visit  Subjective  Patient ID: Natalie Wright, female    DOB: 12-Aug-1950  Age: 73 y.o. MRN: [id number]  Chief Complaint  Patient presents with   Diabetes   Hypertension    HPI  Since she was last here she t to UC for closed fracture of the olecranon process of the left ulna.  She did follow-up with Dr. ONEIDA on August 20.  She was placed in a posterior slab splint.  Given hydrocodone  for pain.  She was supposed to get scheduled for a lumbar epidural but they had to delay that because of her fracture.  Here today for follow-up of diabetes.  Discussed the use of AI scribe software for clinical note transcription with the patient, who gave verbal consent to proceed.  History of Present Illness Natalie Wright is a 73 year old female with diabetes who presents with back pain and diabetes management.  Low back pain - Severe back pain, more intense than recent arm fracture pain - Pain impairs ability to sit, stand, and find comfortable positions when lying down - Completed physical therapy - Interested in starting chair yoga at the [facility] - Not ready for more intensive activities such as swimming at the [facility]  Glycemic control/DM - Blood glucose levels fluctuate, with some readings as high as 300 mg/dL - Last hemoglobin J8r was 8.4%, increased from 6.8% in April - Uses Lantus , adjusting dose between 25 to 35 units based on blood glucose readings - No blood glucose readings below 80 mg/dL - Difficulty maintaining a regular eating schedule, often eating only one meal per day due to appetite suppression from Mounjaro , down 16 lbs.   Continuous glucose monitoring and self-monitoring challenges - Issues with Dexcom device due to insurance denial for new receiver after recall on Dexcom seven - Using test strips from late husband - Has not opened the recalled Dexcom box  Gastrointestinal symptoms - Constipation managed by consuming milk to counteract  effects of pain medication and Mounjaro  - Pancreatic insufficiency contributing to gastrointestinal symptoms    There were no vitals filed for this visit.  ROS    Objective:     There were no vitals taken for this visit.   Physical Exam Vitals and nursing note reviewed.  Constitutional:      Appearance: Normal appearance.  HENT:     Head: Normocephalic and atraumatic.  Eyes:     Conjunctiva/sclera: Conjunctivae normal.  Cardiovascular:     Rate and Rhythm: Normal rate and regular rhythm.  Pulmonary:     Effort: Pulmonary effort is normal.     Breath sounds: Normal breath sounds.  Musculoskeletal:     Comments: She has gutter splint on left arm with ACE wrap.   Skin:    General: Skin is warm and dry.  Neurological:     Mental Status: She is alert.  Psychiatric:        Mood and Affect: Mood normal.      Results for orders placed or performed in visit on 02/25/24  POCT HgB A1C  Result Value Ref Range   Hemoglobin A1C 8.4 (A) 4.0 - 5.6 %   HbA1c POC (<> result, manual entry)     HbA1c, POC (prediabetic range)     HbA1c, POC (controlled diabetic range)        The 10-year ASCVD risk score (Arnett DK, et al., 2019) is: 22.9%    Assessment & Plan:   Problem List Items Addressed This Visit  Endocrine   Type 2 diabetes mellitus with neurological complications (HCC) - Primary   Relevant Medications   metFORMIN  (GLUCOPHAGE ) 1000 MG tablet   tirzepatide  (MOUNJARO ) 15 MG/0.5ML Pen   Continuous Glucose Sensor (FREESTYLE LIBRE 3 PLUS SENSOR) MISC   Other Relevant Orders   POCT HgB A1C (Completed)   Other Visit Diagnoses       Right lumbar radiculopathy       Relevant Medications   DULoxetine  (CYMBALTA ) 30 MG capsule     Arthralgia, unspecified joint       Relevant Medications   DULoxetine  (CYMBALTA ) 30 MG capsule     Closed fracture of olecranon process of left ulna, initial encounter          Assessment and Plan Assessment & Plan Pain due to  recent arm fracture/ left olecranon fracture.  Pain primarily in the back, more than the arm. Current provider unavailable, requiring new provider for follow-up care. - Schedule follow-up with Doctor Gottswall in two weeks for fracture care. - Ensure arm is elevated above heart level to reduce swelling. - Apply a new Ace Wrap for comfort.  Lumbar radiculopathy Significant back pain affecting mobility and comfort. Current provider unavailable for scheduled injection. - Schedule appointment with Doctor Reeta for back pain management. - Consider chair yoga and other activities at the [facility] after clearance.  Type 2 diabetes mellitus with poor glycemic control A1c increased from 6.8 to 8.4. Blood sugars fluctuate with some readings in the 300s. Current insulin  regimen is 25-35 units of Lantus  based on blood sugar levels. Issues with Dexcom receiver and insurance coverage. Discussed potential for [employer] as an alternative. Discussed the impact of fasting on blood sugar levels and the potential for liver glucose release causing spikes. - Attempt to obtain [facility] for continuous glucose monitoring. - Encourage small, frequent meals to stabilize blood sugar levels. - Monitor blood sugar levels closely and adjust insulin  as needed.  Constipation associated with opioid and Mounjaro  use Constipation managed by consuming milk to counteract effects of pain medication and Mounjaro . Discussed potential for Mounjaro  to slow colon transit time. - Continue using milk to manage constipation.   Return in about 3 months (around 05/27/2024) for Diabetes follow-up.    Natalie Byars, MD

## 2024-02-25 NOTE — Patient Instructions (Signed)
 Make sure eating small amounts of protein during the daytime.

## 2024-02-26 ENCOUNTER — Telehealth: Payer: Self-pay

## 2024-02-26 ENCOUNTER — Encounter: Payer: Self-pay | Admitting: Family Medicine

## 2024-02-26 ENCOUNTER — Other Ambulatory Visit: Payer: Self-pay

## 2024-02-26 DIAGNOSIS — S52022A Displaced fracture of olecranon process without intraarticular extension of left ulna, initial encounter for closed fracture: Secondary | ICD-10-CM

## 2024-02-26 NOTE — Telephone Encounter (Signed)
 LM for pt to return call. Also sent a MyChart message. Annabella Rigg, CMA

## 2024-02-27 MED ORDER — HYDROCODONE-ACETAMINOPHEN 5-325 MG PO TABS: 1.0000 | ORAL_TABLET | Freq: Three times a day (TID) | ORAL | 0 refills | Status: DC | PRN

## 2024-02-27 NOTE — Telephone Encounter (Signed)
 Patient requesting rx rf of Hydrocodone  acet 5-325mg  until patient can be seen at new orthopaedic provider in [facility] on 03/19/2024  Last written 08/20/225 Last OV 02/25/2024 Upcoming appt = none

## 2024-02-27 NOTE — Telephone Encounter (Signed)
 Sent earlier today

## 2024-02-27 NOTE — Addendum Note (Signed)
 Addended by: Ramisa Duman P on: 02/27/2024 08:28 AM   Modules accepted: Orders

## 2024-02-28 NOTE — Telephone Encounter (Signed)
 Will have to call [facility] and cancel the 15 mg it sounds like she wants to stay on the 12.5 I do not know if they still have any refills left over on that dose or if we will need to send a new prescription.  Okay to send in 90-day on the 12.5 mg if they need an updated prescription.

## 2024-03-03 ENCOUNTER — Encounter: Payer: Self-pay | Admitting: Sports Medicine

## 2024-03-06 ENCOUNTER — Encounter: Payer: Self-pay | Admitting: Family Medicine

## 2024-03-06 ENCOUNTER — Ambulatory Visit: Admitting: Podiatry

## 2024-03-06 DIAGNOSIS — S52022A Displaced fracture of olecranon process without intraarticular extension of left ulna, initial encounter for closed fracture: Secondary | ICD-10-CM

## 2024-03-06 MED ORDER — HYDROCODONE-ACETAMINOPHEN 5-325 MG PO TABS: 1.0000 | ORAL_TABLET | Freq: Three times a day (TID) | ORAL | 0 refills | Status: DC | PRN

## 2024-03-19 ENCOUNTER — Other Ambulatory Visit: Payer: Self-pay

## 2024-03-19 ENCOUNTER — Encounter: Payer: Self-pay | Admitting: Family Medicine

## 2024-03-19 ENCOUNTER — Ambulatory Visit (INDEPENDENT_AMBULATORY_CARE_PROVIDER_SITE_OTHER)

## 2024-03-19 ENCOUNTER — Ambulatory Visit: Admitting: Sports Medicine

## 2024-03-19 VITALS — BP 118/39 | Ht 60.0 in | Wt 260.0 lb

## 2024-03-19 DIAGNOSIS — M47816 Spondylosis without myelopathy or radiculopathy, lumbar region: Secondary | ICD-10-CM | POA: Diagnosis not present

## 2024-03-19 DIAGNOSIS — M1711 Unilateral primary osteoarthritis, right knee: Secondary | ICD-10-CM | POA: Diagnosis not present

## 2024-03-19 DIAGNOSIS — S52022A Displaced fracture of olecranon process without intraarticular extension of left ulna, initial encounter for closed fracture: Secondary | ICD-10-CM

## 2024-03-19 DIAGNOSIS — M48061 Spinal stenosis, lumbar region without neurogenic claudication: Secondary | ICD-10-CM | POA: Diagnosis not present

## 2024-03-19 DIAGNOSIS — S52022D Displaced fracture of olecranon process without intraarticular extension of left ulna, subsequent encounter for closed fracture with routine healing: Secondary | ICD-10-CM

## 2024-03-19 MED ORDER — HYDROCODONE-ACETAMINOPHEN 5-325 MG PO TABS: 1.0000 | ORAL_TABLET | Freq: Three times a day (TID) | ORAL | 0 refills | Status: DC | PRN

## 2024-03-19 NOTE — Telephone Encounter (Signed)
 Meds ordered this encounter  Medications   HYDROcodone -acetaminophen  (NORCO/VICODIN) 5-325 MG tablet    Sig: Take 1 tablet by mouth every 8 (eight) hours as needed for moderate pain (pain score 4-6).    Dispense:  15 tablet    Refill:  0

## 2024-03-19 NOTE — Telephone Encounter (Signed)
 I saw Dr Lynwood in [place].{[facility] canceled my appt and referred me to [facility]).. My ongoing services will be in [facility] from here on out. I was told to contact Dr Alvan for a refill of hydrocodone ...my fracture is doing well . The splint was removed. My spine shot is scheduled for next Friday Then I should be able to go back to tramadol  for pain. I Cannot get the OTHO VISC SHOT in my knee until November.   Thank You. Hopefully this is the last refill I need

## 2024-03-20 NOTE — Progress Notes (Addendum)
 PCP: Alvan Dorothyann BIRCH, MD  Subjective:   HPI: Patient is a 73 y.o. female here for follow-up regarding left olecranon process fracture.  Patient had been following with Dr. ONEIDA at [facility] and initially experienced a fall in early August with trauma over her left elbow.  First round of x-rays taken on 02/13/2024 showing concern for subtle nondisplaced intra-articular fracture of the ulnar olecranon.  Patient was placed in an long-arm ulnar gutter splint and immobilized at the elbow.  Repeat x-rays on 03/02/2024 showed no evidence of fracture.  Patient is continue to be immobilized in posterior splint up until her follow-up with us  this morning.  She initially had mild pain at the olecranon process but said it has been fairly mild over the last couple of weeks.  Still has some tenderness with resting elbow on hard surfaces even though splint is in place.  She has yet to take her arm out of her long-arm splint and do any range of motion exercises.  Her primary concern today is low back pain with radiculopathy which she has a epidural steroid injection planned for 1 week from today.  The procedure had previously been delayed due to the risk of giving cortisone while having an acute fracture.  Her left elbow is not significantly bothering her it is her low back pain and her right knee pain that she is more concerned about.  Past Medical History:  Diagnosis Date   Allergy    to cats and dogs   Anemia    Arthritis    Cataract    Diabetes mellitus    GERD (gastroesophageal reflux disease)    Glaucoma    Hiatal hernia    and delayed gastric emptying/ sees [facility] for recurrent diarrhea   History of cardiovascular disorder 12/10/2008   Qualifier: Diagnosis of  By: Alvan MD, Catherine     Hypertension    OSA (obstructive sleep apnea)    CPAP   Pedal edema    Sleep apnea    Spastic bladder    urology in [place]   TIA (transient ischemic attack)    hx of- sees Dr  Robin (Neurology)    Current Outpatient Medications on File Prior to Visit  Medication Sig Dispense Refill   AMBULATORY NON FORMULARY MEDICATION Medication Name: custom fit knee high compression stocking with 15-20 mmHg pressure. Open toe preferred. 1 vial 0   aspirin 325 MG tablet Take 325 mg by mouth daily.     AZELAIC ACID EX Apply topically.     cetirizine  (ZYRTEC ) 10 MG tablet Take 10 mg by mouth daily.     chlorthalidone  (HYGROTON ) 25 MG tablet Take 1 tablet (25 mg total) by mouth daily. 90 tablet 3   clopidogrel  (PLAVIX ) 75 MG tablet TAKE 1 TABLET DAILY 90 tablet 3   Continuous Glucose Sensor (FREESTYLE LIBRE 3 PLUS SENSOR) MISC Change sensor every 15 days. 3 each 1   diltiazem  (CARDIZEM  CD) 360 MG 24 hr capsule Take 1 capsule (360 mg total) by mouth daily. 90 capsule 1   DULoxetine  (CYMBALTA ) 30 MG capsule Take 1 capsule (30 mg total) by mouth daily. 90 capsule 3   Ferrous Sulfate Dried 200 (65 Fe) MG TABS Take 1 tablet by mouth daily.     finasteride (PROPECIA) 1 MG tablet Take 1 mg by mouth daily.     insulin  glargine (LANTUS ) 100 UNIT/ML injection INJECT 35 UNITS UNDER THE SKIN DAILY 30 mL 3   Insulin  Pen  Needle (NOVOFINE) 30G X 8 MM MISC For use each time injection for insulin  100 each 4   JARDIANCE  25 MG TABS tablet TAKE 1 TABLET DAILY 90 tablet 3   latanoprost (XALATAN) 0.005 % ophthalmic solution 1 drop.     lisinopril  (ZESTRIL ) 20 MG tablet TAKE 1 TABLET DAILY 90 tablet 3   meloxicam  (MOBIC ) 15 MG tablet TAKE 1 TABLET DAILY AS NEEDED 90 tablet 3   metFORMIN  (GLUCOPHAGE ) 1000 MG tablet Take 1 tablet (1,000 mg total) by mouth 2 (two) times daily with a meal. 180 tablet 1   Misc. Devices MISC Transfer of care to [facility].  Please provide service to her current CPAP machine for OSA at 14 cm. water pressure.  Send directly to [facility] at [facility].     NIACINAMIDE EX Apply topically.     nystatin cream (MYCOSTATIN) Apply 1 Application topically 2 (two) times daily. (Patient taking  differently: Apply 1 Application topically as needed.)     nystatin powder Apply 1 Application topically 3 (three) times daily. (Patient taking differently: Apply 1 Application topically as needed.)     Omega-3 Fatty Acids (FISH OIL) 1200 MG CAPS Take 1 capsule by mouth daily.     pantoprazole  (PROTONIX ) 40 MG tablet TAKE 1 TABLET TWICE A DAY 180 tablet 3   rosuvastatin  (CRESTOR ) 10 MG tablet TAKE 1 TABLET ONCE A WEEK AT BEDTIME 12 tablet 3   tirzepatide  (MOUNJARO ) 15 MG/0.5ML Pen Inject 15 mg into the skin once a week. 6 mL 1   traMADol  (ULTRAM ) 50 MG tablet Take 1 tablet (50 mg total) by mouth daily as needed for moderate pain (pain score 4-6). 90 tablet 1   Tranexamic Acid POWD by Does not apply route.     triamcinolone  cream (KENALOG ) 0.5 % Apply 1 Application topically 3 (three) times daily.     No current facility-administered medications on file prior to visit.    Past Surgical History:  Procedure Laterality Date   CATARACT EXTRACTION  08/2021   IR ANGIOGRAM SELECTIVE EACH ADDITIONAL VESSEL  04/19/2023   IR ANGIOGRAM SELECTIVE EACH ADDITIONAL VESSEL  04/19/2023   IR ANGIOGRAM SELECTIVE EACH ADDITIONAL VESSEL  04/19/2023   IR EMBO ARTERIAL NOT HEMORR HEMANG INC GUIDE ROADMAPPING  04/19/2023   IR EMBO ARTERIAL NOT HEMORR HEMANG INC GUIDE ROADMAPPING  06/07/2023   IR RADIOLOGIST EVAL & MGMT  03/08/2023   IR RADIOLOGIST EVAL & MGMT  05/20/2023   IR RADIOLOGIST EVAL & MGMT  07/19/2023   IR US  GUIDE VASC ACCESS RIGHT  04/19/2023   TUBAL LIGATION     vaginal skin tag removal      Allergies  Allergen Reactions   Atorvastatin Other (See Comments)    Patient stated muscle weakness Patient stated muscle weakness   Beclomethasone Other (See Comments)    unknown unknown   Cefaclor Hives   Pioglitazone Other (See Comments)    Mouth sores Mouth sores Mouth sores unknown REACTION: ulcers in the mouth   Pravastatin  Other (See Comments)    Chest pain.  Chest pain.    Sulfa  Antibiotics Hives    Other reaction(s): Agitation   Cephalexin Other (See Comments)    unknown   Lipitor [Atorvastatin Calcium ] Other (See Comments)    myalgias   Statins Other (See Comments)    Muscle pain Other reaction(s): Other (See Comments) Muscle pain    BP (!) 118/39   Ht 5' (1.524 m)   Wt 260 lb (117.9 kg)   BMI 50.78  kg/m       No data to display              No data to display              Objective:  Physical Exam:  Gen: NAD, comfortable in exam room  Orthopedic examination of left elbow reveals mild skin irritation without purulence or erythema.  No edema or ecchymoses present.  Patient has no tenderness to palpation over bony landmarks of left elbow including at olecranon process.  Patient lacks about 5 degrees extension but has full flexion.  Upper extremity strength within normal limits bilaterally.  Neurovascularly intact distally.   Assessment & Plan:   #Concern for left ulna olecranon fracture #Left elbow pain after fall  -Initial x-rays taken on 02/13/2024 showed concern for subtle nondisplaced intra-articular fracture of the olecranon ulna.  On independent review of films I am more inclined to believe that initial linear lucency represented a suspiciously located nutrient foramen, especially given that no fracture was seen on repeat x-rays 2 weeks later.  On evaluation today patient does not have any tenderness to palpation over olecranon process where fracture was initially suspected and she has full pain-free range of motion.  -Regardless of initial pathology patient has healed well after traumatic fall and there is no indication for additional casting, splinting, or sling at this time. -Discussed with patient whether she prefers referral for physical therapy for range of motion exercises however she states she would like to start with home exercise program and only follow-up with physical therapy if unable to make sufficient progress  independently  #Acute on chronic low back pain  - Patient's elbow has healed sufficiently where it should no longer be an issue for patient to receive ESI. - She has ESI scheduled for next Friday. - Cleared from our end to get procedure to address acute low back pain   #Chronic right knee pain from end-stage osteoarthritis - Patient has been losing weight and attempt to meet BMI criteria in order to get TKA - Has lost 120 pounds so far - Interested in referral to orthopedic surgeon for consultation for total knee replacement - Will send patient to Dr. Vernetta for evaluation.  Informed her she still may need to lose additional weight prior to qualifying for knee replacement; and that will be the discretion of the operating surgeon

## 2024-03-23 ENCOUNTER — Telehealth: Payer: Self-pay | Admitting: *Deleted

## 2024-03-23 NOTE — Telephone Encounter (Signed)
 [employer] OOP due at time of service: $260 if doing bilateral inj. Coinsurance: 20% approx: $130 Deductible: $257 ($257 met) Prior Auth: not required  [employer] for Life: states OON and benefits are undisclosed.

## 2024-03-23 NOTE — Telephone Encounter (Signed)
Verified [employer] benefits via [employer].

## 2024-03-25 NOTE — Discharge Instructions (Addendum)
 Post Procedure Spinal Discharge Instruction Sheet  You may resume a regular diet and any medications that you routinely take (including pain medications) unless otherwise noted by MD.  No driving day of procedure.  Light activity throughout the rest of the day.  Do not do any strenuous work, exercise, bending or lifting.  The day following the procedure, you can resume normal physical activity but you should refrain from exercising or physical therapy for at least three days thereafter.  You may apply ice to the injection site, 20 minutes on, 20 minutes off, as needed. Do not apply ice directly to skin.    Common Side Effects:  Headaches- take your usual medications as directed by your physician.  Increase your fluid intake.  Caffeinated beverages may be helpful.  Lie flat in bed until your headache resolves.  Restlessness or inability to sleep- you may have trouble sleeping for the next few days.  Ask your referring physician if you need any medication for sleep.  Facial flushing or redness- should subside within a few days.  Increased pain- a temporary increase in pain a day or two following your procedure is not unusual.  Take your pain medication as prescribed by your referring physician.  Leg cramps  Please contact our office at [phone] for the following symptoms: Fever greater than 100 degrees. Headaches unresolved with medication after 2-3 days. Increased swelling, pain, or redness at injection site.   Thank you for visiting [facility] today.   **YOU MAY RESUME YOUR PLAVIX  AND ASPIRIN  TODAY**

## 2024-03-26 ENCOUNTER — Ambulatory Visit
Admission: RE | Admit: 2024-03-26 | Discharge: 2024-03-26 | Disposition: A | Discharge status: Home / Self Care | Source: Ambulatory Visit | Attending: Sports Medicine | Admitting: Sports Medicine

## 2024-03-26 DIAGNOSIS — Z0189 Encounter for other specified special examinations: Secondary | ICD-10-CM | POA: Diagnosis not present

## 2024-03-26 DIAGNOSIS — M48061 Spinal stenosis, lumbar region without neurogenic claudication: Secondary | ICD-10-CM

## 2024-03-26 MED ORDER — IOPAMIDOL (ISOVUE-M 200) INJECTION 41%
1.0000 mL | Freq: Once | INTRAMUSCULAR | Status: AC
  Administered 2024-03-26: 1 mL via EPIDURAL

## 2024-03-26 MED ORDER — METHYLPREDNISOLONE ACETATE 40 MG/ML INJ SUSP (RADIOLOG
80.0000 mg | Freq: Once | INTRAMUSCULAR | Status: AC
  Administered 2024-03-26: 80 mg via EPIDURAL

## 2024-04-01 ENCOUNTER — Other Ambulatory Visit: Payer: Self-pay | Admitting: Interventional Radiology

## 2024-04-01 DIAGNOSIS — M25561 Pain in right knee: Secondary | ICD-10-CM

## 2024-04-02 ENCOUNTER — Ambulatory Visit (INDEPENDENT_AMBULATORY_CARE_PROVIDER_SITE_OTHER)

## 2024-04-02 VITALS — Ht 60.0 in | Wt 260.0 lb

## 2024-04-02 DIAGNOSIS — M2550 Pain in unspecified joint: Secondary | ICD-10-CM | POA: Diagnosis not present

## 2024-04-02 DIAGNOSIS — M47816 Spondylosis without myelopathy or radiculopathy, lumbar region: Secondary | ICD-10-CM | POA: Diagnosis not present

## 2024-04-02 DIAGNOSIS — M1711 Unilateral primary osteoarthritis, right knee: Secondary | ICD-10-CM

## 2024-04-02 DIAGNOSIS — M5416 Radiculopathy, lumbar region: Secondary | ICD-10-CM

## 2024-04-02 MED ORDER — DULOXETINE HCL 60 MG PO CPEP
60.0000 mg | ORAL_CAPSULE | Freq: Every day | ORAL | 1 refills | Status: AC
Start: 2024-04-02 — End: ?

## 2024-04-02 NOTE — Progress Notes (Signed)
   Subjective:    Patient ID: Natalie Wright, female    DOB: 73 y.o., 30-May-1951   MRN: [id number]  HPI  Chief Complaint: Bilateral knee osteoarthritis  73 year old female past medical history significant for Raynaud's disease, sleep apnea, atrial septal aneurysm, type 2 diabetes presenting for follow-up on bilateral knee osteoarthritis.  She has previously received intra-articular hyaluronic acid injections in her bilateral knees with the last dose of which being on 10/31/2023 under the guidance of Dr. Curtis.  She reports that she has an appointment upcoming with a knee replacement surgeon Dr. Vernetta in 2 weeks.  States tramadol  50 mg does not provide her a lot of relief for her knees/hips Interested in increasing this if possible Admits to having a significant fall leading to a fracture and hospitalization within the past year Has received intra-articular corticosteroid injections in her knees with minimal benefit Previously had genicular nerve ablation with mild benefit at best     Objective:   Physical Exam There were no vitals filed for this visit.  Const: appears well, non-toxic, well groomed Psych: affect bright, interactive, smiling EENT: EOMI intact, conjunctiva appear normal Neck: no obvious masses, appears symmetric Resp: non-labored, appears symmetric Neuro: muscle bulk appears normal Skin: no obvious rashes noted     Assessment & Plan:   Natalie Wright is a 74 y.o. female presenting for discussion of management of her osteoarthritis in her knees. I discussed with the patient the nature of osteoarthritis their various options available for treating this including physical therapy/home exercises, topical anti-inflammatories, oral anti-inflammatories, intra-articular injections (corticosteroids, NSAIDs, hyaluronic acid, platelet rich plasma, prolotherapy), genicular nerve block/ablations, & ultimately joint replacement.  I informed her that I balk at the idea of  increasing her tramadol  given that it has not provided much relief to her and would increase her risk of falls.  I have gone ahead and increased her dose of Cymbalta  from 30 mg to 60 mg and he will remove this as I believe this is more likely to provide a better chronic pain treatment without the risk for sedation and falls.  I recommend that she call us  prior to November 1 which is when she will be due for her next hyaluronic acid injection so that we can schedule her and get the medicine to our clinic ahead of time.  Greater than 30 minutes of time was spent in face-to-face interaction with the patient discussing the nature of osteoarthritis, laying out the various treatment options, and working with her to decide on appropriate medical therapy, coinciding with appropriate injection therapies for her.

## 2024-04-09 ENCOUNTER — Ambulatory Visit: Admitting: Podiatry

## 2024-04-09 ENCOUNTER — Encounter: Payer: Self-pay | Admitting: Podiatry

## 2024-04-09 VITALS — Ht 60.0 in | Wt 260.0 lb

## 2024-04-09 DIAGNOSIS — Z7901 Long term (current) use of anticoagulants: Secondary | ICD-10-CM | POA: Diagnosis not present

## 2024-04-09 DIAGNOSIS — M79675 Pain in left toe(s): Secondary | ICD-10-CM | POA: Diagnosis not present

## 2024-04-09 DIAGNOSIS — M79674 Pain in right toe(s): Secondary | ICD-10-CM

## 2024-04-09 DIAGNOSIS — B351 Tinea unguium: Secondary | ICD-10-CM | POA: Diagnosis not present

## 2024-04-09 NOTE — Progress Notes (Signed)
 Subjective: Chief Complaint  Patient presents with   Nail Problem    73 y.o. returns the office today for painful, elongated, thickened toenails which they cannot trim herself.  She denies any ulcerations. No other concerns today.   She is on Plavix .  PCP: Alvan Dorothyann BIRCH, MD Last seen 02/25/2024 A1c: 8.4 on 02/25/2024  Objective: AAO 3, NAD DP/PT pulses palpable, CRT less than 3 seconds Protective sensation decreased with Simms Weinstein monofilament Nails hypertrophic, dystrophic, elongated, brittle, discolored 10. There is tenderness overlying the nails 1-5 bilaterally. There is no surrounding erythema or drainage along the nail sites. No other areas of discomfort. Decrease in arch upon weightbearing.  No pain with calf compression, swelling, warmth, erythema.  Assessment: Patient presents with symptomatic onychomycosis  Plan: -Treatment options including alternatives, risks, complications were discussed -Nails sharply debrided 10 without complication/bleeding. -Discussed daily foot inspection. If there are any changes, to call the office immediately.   Return in about 3 months (around 07/10/2024).  Donnice Fees, DPM

## 2024-04-10 NOTE — Telephone Encounter (Addendum)
 Pt informed of below.  She states she has never had to pay OOP for gel injections at Dr. Nathalie office. I informed her something is not correct with her [employer] benefits, but we are unable to obtain benefits from them at this time. Patient states she will come to her appt prepared to pay if necessary.   She states she eligible for her next round of HA gel injections in November 2025.

## 2024-04-13 ENCOUNTER — Ambulatory Visit (INDEPENDENT_AMBULATORY_CARE_PROVIDER_SITE_OTHER)

## 2024-04-13 ENCOUNTER — Other Ambulatory Visit: Payer: Self-pay

## 2024-04-13 VITALS — BP 104/60 | Ht 60.0 in | Wt 260.0 lb

## 2024-04-13 DIAGNOSIS — M1712 Unilateral primary osteoarthritis, left knee: Secondary | ICD-10-CM

## 2024-04-13 DIAGNOSIS — M1711 Unilateral primary osteoarthritis, right knee: Secondary | ICD-10-CM

## 2024-04-13 NOTE — Progress Notes (Unsigned)
   Subjective:    Patient ID: Natalie Wright, female    DOB: 73 y.o., 08-27-1950   MRN: [id number]  Chief Complaint: Bilateral knee osteoarthritis  Discussed the use of AI scribe software for clinical note transcription with the patient, who gave verbal consent to proceed.  History of Present Illness  Patient requesting bilateral dextrose prolotherapy injections today She is planning to receive hyaluronic acid in November.      Objective:   There were no vitals filed for this visit.  Intra-articular Knee Injection with Ultrasound Guidance Procedure Note Natalie Wright 1950-08-07 Indications: Pain Procedure Details Following the description of risks including infection, bleeding, damage to surrounding structures, patient provided written consent for left knee joint injection procedure. US  was used to identify the suprapatellar pouch. Patient was sterilely prepped in the usual fashion with alcohol.  Following topical anesthetization with ethyl chloride they were injected with a solution of 25% dextrose prolotherapy with steroid via the superolateral approach into the suprapatellar pouch. This was well visualized under ultrasound, please see associated photographic documentation. Patient tolerated well without complication.  Precautions provided. Cleaned and dressing applied.    Intra-articular Knee Injection with Ultrasound Guidance Procedure Note Natalie Wright 1950-08-06 Indications: Pain Procedure Details Following the description of risks including infection, bleeding, damage to surrounding structures, patient provided written consent for right knee joint injection procedure. US  was used to identify the suprapatellar pouch. Patient was sterilely prepped in the usual fashion with alcohol.  Following topical anesthetization with ethyl chloride they were injected with a solution of 25% dextrose prolotherapy with steroid via the superolateral approach into the suprapatellar pouch. This  was well visualized under ultrasound, please see associated photographic documentation. Patient tolerated well without complication.  Precautions provided. Cleaned and dressing applied.       Assessment & Plan:   Assessment & Plan  Natalie Wright is a delightful 73 year old female well-documented osteoarthritis of her bilateral knees.  She wishes to proceed with dextrose prolotherapy injections given her limited response to hyaluronic acid in the past.  I believe this is reasonable.  We will go ahead and proceed with this today and plan for follow-up as needed.  Consider genicular nerve block or Toradol  injection if response limited to prolotherapy.

## 2024-04-20 DIAGNOSIS — Z23 Encounter for immunization: Secondary | ICD-10-CM | POA: Diagnosis not present

## 2024-04-23 ENCOUNTER — Ambulatory Visit (INDEPENDENT_AMBULATORY_CARE_PROVIDER_SITE_OTHER)

## 2024-04-23 ENCOUNTER — Other Ambulatory Visit: Payer: Self-pay

## 2024-04-23 VITALS — BP 122/70 | Ht 60.0 in | Wt 260.0 lb

## 2024-04-23 DIAGNOSIS — M1711 Unilateral primary osteoarthritis, right knee: Secondary | ICD-10-CM

## 2024-04-23 DIAGNOSIS — M1712 Unilateral primary osteoarthritis, left knee: Secondary | ICD-10-CM

## 2024-04-23 NOTE — Progress Notes (Signed)
   Subjective:    Patient ID: Eron Goble, female    DOB: 73 y.o., [birth year]   MRN: [id number]  Chief Complaint: Prolotherapy injection #2 bilateral knees  Discussed the use of AI scribe software for clinical note transcription with the patient, who gave verbal consent to proceed.  History of Present Illness  Patient with bilateral knee arthritis electing to pursue bilateral dextrose prolotherapy injections.     Objective:   There were no vitals filed for this visit.  Bilateral knees: Tenderness to palpation along medial lateral joint lines. Visible effusion.  Left intra-articular Knee Injection with Ultrasound Guidance Procedure Note Marleena Shubert June 16, 1951 Indications: Pain Procedure Details Following the description of risks including infection, bleeding, damage to surrounding structures, patient provided written consent for Left knee joint injection procedure. US  was used to identify the suprapatellar pouch. Patient was sterilely prepped in the usual fashion with alcohol.  Following topical anesthetization with ethyl chloride a tract was numbed using a 25-gauge needle and a solution of 1% lidocaine  and 8.4% sodium bicarbonate along the tract to the suprapatellar pouch.  Following this, an 18-gauge needle was inserted and 18 cc of serous appearing fluid were aspirated then they were injected with a solution of 25% hypertonic dextrose in saline via the superolateral approach into the suprapatellar pouch. This was well visualized under ultrasound, please see associated photographic documentation. Patient tolerated well without complication.  Precautions provided. Cleaned and dressing applied.  Right intra-articular Knee Injection with Ultrasound Guidance Procedure Note Dazia Lippold June 21, 1951 Indications: Pain Procedure Details Following the description of risks including infection, bleeding, damage to surrounding structures, patient provided written consent for right knee  joint injection procedure. US  was used to identify the suprapatellar pouch. Patient was sterilely prepped in the usual fashion with alcohol.  Following topical anesthetization with ethyl chloride a tract was numbed using a 25-gauge needle and a solution of 1% lidocaine  and 8.4% sodium bicarbonate along the tract to the suprapatellar pouch.  Following this, an 18-gauge needle was inserted and 10 cc of serous appearing fluid were aspirated then they were injected with a solution of 25% hypertonic dextrose in saline via the superolateral approach into the suprapatellar pouch. This was well visualized under ultrasound, please see associated photographic documentation. Patient tolerated well without complication.  Precautions provided. Cleaned and dressing applied.      Assessment & Plan:   Assessment & Plan  Dextrose prolotherapy injection #2 in bilateral knees for osteoarthritis.  Follow-up in 4 weeks for injection #3.

## 2024-04-27 ENCOUNTER — Ambulatory Visit: Admitting: Orthopaedic Surgery

## 2024-04-27 VITALS — Ht 60.0 in | Wt 234.0 lb

## 2024-04-27 DIAGNOSIS — H401130 Primary open-angle glaucoma, bilateral, stage unspecified: Secondary | ICD-10-CM | POA: Diagnosis not present

## 2024-04-27 DIAGNOSIS — H2512 Age-related nuclear cataract, left eye: Secondary | ICD-10-CM | POA: Diagnosis not present

## 2024-04-27 DIAGNOSIS — H2011 Chronic iridocyclitis, right eye: Secondary | ICD-10-CM | POA: Diagnosis not present

## 2024-04-27 DIAGNOSIS — M1711 Unilateral primary osteoarthritis, right knee: Secondary | ICD-10-CM | POA: Insufficient documentation

## 2024-04-27 DIAGNOSIS — E113213 Type 2 diabetes mellitus with mild nonproliferative diabetic retinopathy with macular edema, bilateral: Secondary | ICD-10-CM | POA: Diagnosis not present

## 2024-04-27 DIAGNOSIS — M1712 Unilateral primary osteoarthritis, left knee: Secondary | ICD-10-CM | POA: Diagnosis not present

## 2024-04-27 DIAGNOSIS — H43811 Vitreous degeneration, right eye: Secondary | ICD-10-CM | POA: Diagnosis not present

## 2024-04-27 NOTE — Progress Notes (Signed)
 The patient is a 73 year old female that I am seeing for the first time as a relates to known and well-documented severe end-stage arthritis of both her knees.  She is sent to me from Dr. Prentice Agent.  She has had multiple injections in her knees from hyaluronic acid standpoint and is actually going through a series of injections now.  She also has been on a weight loss journey.  Her last BMI before today recorded in our chart was 50.  Today is down to 45.7.  She is also diabetic.  2 months ago her hemoglobin A1c was 8.4.  She mainly lives alone and has been becoming increasingly a fall risk.  Her knee pain is daily and it is detrimentally affecting her mobility, her quality of life and her actives daily living.  Examination of both knees today she has varus malalignment.  There is significant medial joint line tenderness.  She does have a lot of redundant soft tissue around her knees but not at the knee joint itself but certainly in the thighs.  Previous x-rays of both of her knees from February 2024 show end-stage bone-on-bone arthritis with varus malalignment.  The arthritis involving all 3 compartments and severe.  We talked in length in detail about continued weight loss but also better diabetic control.  We would not be able to schedule her surgery due to her BMI was 39.99 (below 40) and an A1c of below 7.7.  She understands that is from my standpoint of decreasing her infection risk.  Will see her back in 2 months and at that visit we will have a repeat weight and BMI and can even check her A1c in the office then if at her November appointment with primary care it is not below 7.7 in terms of her A1c.

## 2024-05-01 ENCOUNTER — Ambulatory Visit

## 2024-05-04 ENCOUNTER — Encounter: Payer: Self-pay | Admitting: Emergency Medicine

## 2024-05-04 ENCOUNTER — Ambulatory Visit
Admission: EM | Admit: 2024-05-04 | Discharge: 2024-05-04 | Disposition: A | Discharge status: Home / Self Care | Attending: Family Medicine | Admitting: Family Medicine

## 2024-05-04 ENCOUNTER — Encounter: Payer: Self-pay | Admitting: Radiology

## 2024-05-04 DIAGNOSIS — M62838 Other muscle spasm: Secondary | ICD-10-CM | POA: Diagnosis not present

## 2024-05-04 HISTORY — DX: Unspecified iridocyclitis: H20.9

## 2024-05-04 MED ORDER — METHOCARBAMOL 500 MG PO TABS: 500.0000 mg | ORAL_TABLET | Freq: Two times a day (BID) | ORAL | 0 refills | Status: DC

## 2024-05-04 NOTE — Discharge Instructions (Addendum)
 Continue meloxicam  once a day this is an anti-inflammatory arthritis pain medicine May take tramadol  as needed for pain.  Caution dizziness and drowsiness.  May take 1 or 2 pills 3 times a day.  Continue to use your walker or cane for stability Take methocarbamol at bedtime.  This is a mild muscle relaxer.  You may take twice a day if needed May use ice or heat to area See Dr. Sharlene in follow-up

## 2024-05-04 NOTE — ED Triage Notes (Signed)
 Pt presents with c/o a pain that extends from the lower part of the back of her head into the top of her back on the right side x 1 week. States moving makes the pain worse. Has tried ice, heat, Tylenol  and Tramadol  with no relief. Reports limited ROM, turning head to the right is painful

## 2024-05-04 NOTE — ED Provider Notes (Signed)
 [facility]    CSN: [id number] Arrival date & time: 05/04/24  1335      History   Chief Complaint Chief Complaint  Patient presents with   Neck Pain    HPI Natalie Wright is a 73 y.o. female.   HPI patient states she woke up several days ago with pain in her neck.  Its on the right side of her neck.  It goes from her occiput down into her neck and she points to her upper body of trapezius.  It hurts with certain with usual over-the-counter medications.  She states she did not have problems with her neck Patient has hypertension hyperlipidemia diabetes.  Known lumbar disc disease.  Arthritis.  Allergies.  Past Medical History:  Diagnosis Date   Allergy    to cats and dogs   Anemia    Arthritis    Cataract    Diabetes mellitus    GERD (gastroesophageal reflux disease)    Glaucoma    Hiatal hernia    and delayed gastric emptying/ sees [facility] for recurrent diarrhea   History of cardiovascular disorder 12/10/2008   Qualifier: Diagnosis of  By: Alvan MD, Catherine     Hypertension    OSA (obstructive sleep apnea)    CPAP   Pedal edema    Sleep apnea    Spastic bladder    urology in [place]   TIA (transient ischemic attack)    hx of- sees Dr Robin (Neurology)   Uveitis     Patient Active Problem List   Diagnosis Date Noted   Unilateral primary osteoarthritis, left knee 04/27/2024   Unilateral primary osteoarthritis, right knee 04/27/2024   Fracture, olecranon, left, closed, initial encounter 02/19/2024   BMI 50.0-59.9, adult (HCC) 06/04/2023   Diabetic retinopathy (HCC) 02/26/2023   Lumbar spondylosis 07/03/2022   Lumbar spinal stenosis 02/26/2022   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/2022   Bilateral hip pain 01/12/2022   Hearing loss due to cerumen impaction, right 01/12/2022   Hammertoe, bilateral 08/23/2021   History of foot ulcer 08/23/2021   Primary osteoarthritis of both knees 12/09/2020   History of burning pain in leg 08/29/2020    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   Tremor 05/05/2020   Pre-ulcerative corn or callous 01/29/2020   History of transient ischemic attack (TIA) 05/15/2018   Family history of ovarian cancer 04/02/2017   Aortic atherosclerosis 08/31/2016   Venous stasis 08/10/2016   Palpitations 07/16/2016   Atrial dilatation, left 07/16/2016   Atrial septal aneurysm 07/16/2016   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   Cataract 04/18/2012   Hyperlipidemia 02/13/2011   Pulmonary nodules 09/22/2010   LEG PAIN 09/12/2009   Sleep apnea 04/13/2009   GERD 03/14/2009   Morbid obesity (HCC) 12/10/2008   Essential hypertension, benign 12/10/2008   RAYNAUD'S DISEASE 12/10/2008   Osteoarthritis 12/10/2008   URINARY INCONTINENCE 12/10/2008    Past Surgical History:  Procedure Laterality Date   CATARACT EXTRACTION  08/2021   IR ANGIOGRAM SELECTIVE EACH ADDITIONAL VESSEL  04/19/2023   IR ANGIOGRAM SELECTIVE EACH ADDITIONAL VESSEL  04/19/2023   IR ANGIOGRAM SELECTIVE EACH ADDITIONAL VESSEL  04/19/2023   IR EMBO ARTERIAL NOT HEMORR HEMANG INC GUIDE ROADMAPPING  04/19/2023   IR EMBO ARTERIAL NOT HEMORR HEMANG INC GUIDE ROADMAPPING  06/07/2023   IR RADIOLOGIST EVAL & MGMT  03/08/2023   IR RADIOLOGIST EVAL & MGMT  05/20/2023   IR RADIOLOGIST EVAL & MGMT  07/19/2023   IR US  GUIDE  VASC ACCESS RIGHT  04/19/2023   TUBAL LIGATION     vaginal skin tag removal      OB History     Gravida  4   Para  2   Term  2   Preterm      AB  1   Living         SAB  1   IAB      Ectopic      Multiple      Live Births               Home Medications    Prior to Admission medications   Medication Sig Start Date End Date Taking? Authorizing Provider  meloxicam  (MOBIC ) 15 MG tablet TAKE 1 TABLET DAILY AS NEEDED 11/04/23  Yes Curtis Debby PARAS, MD  methocarbamol (ROBAXIN) 500 MG tablet Take 1 tablet (500 mg total) by mouth 2 (two) times daily. 05/04/24  Yes Maranda Jamee Jacob, MD  AMBULATORY NON FORMULARY MEDICATION Medication Name: custom fit knee high compression stocking with 15-20 mmHg pressure. Open toe preferred. 08/10/16   Alvan Dorothyann BIRCH, MD  aspirin 325 MG tablet Take 325 mg by mouth daily.    [provider]  AZELAIC ACID EX Apply topically.    [provider]  cetirizine  (ZYRTEC ) 10 MG tablet Take 10 mg by mouth daily.    [provider]  chlorthalidone  (HYGROTON ) 25 MG tablet Take 1 tablet (25 mg total) by mouth daily. 07/19/23   Alvan Dorothyann BIRCH, MD  clopidogrel  (PLAVIX ) 75 MG tablet TAKE 1 TABLET DAILY 10/25/23   Alvan Dorothyann BIRCH, MD  Continuous Glucose Sensor (FREESTYLE LIBRE 3 PLUS SENSOR) MISC Change sensor every 15 days. 02/25/24   Alvan Dorothyann BIRCH, MD  diltiazem  (CARDIZEM  CD) 360 MG 24 hr capsule Take 1 capsule (360 mg total) by mouth daily. 02/25/24   Alvan Dorothyann BIRCH, MD  DULoxetine  (CYMBALTA ) 60 MG capsule Take 1 capsule (60 mg total) by mouth daily. 04/02/24   Gottwalt, Redell A, DO  Ferrous Sulfate Dried 200 (65 Fe) MG TABS Take 1 tablet by mouth daily.    [provider]  finasteride (PROPECIA) 1 MG tablet Take 1 mg by mouth daily.    [provider]  HYDROcodone -acetaminophen  (NORCO/VICODIN) 5-325 MG tablet Take 1 tablet by mouth every 8 (eight) hours as needed for moderate pain (pain score 4-6). 03/19/24   Alvan Dorothyann BIRCH, MD  insulin  glargine (LANTUS ) 100 UNIT/ML injection INJECT 35 UNITS UNDER THE SKIN DAILY 02/10/24   Alvan Dorothyann BIRCH, MD  Insulin  Pen Needle (NOVOFINE) 30G X 8 MM MISC For use each time injection for insulin  02/10/16   Alvan Dorothyann BIRCH, MD  JARDIANCE  25 MG TABS tablet TAKE 1 TABLET DAILY 04/08/23   Alvan Dorothyann BIRCH, MD  latanoprost (XALATAN) 0.005 % ophthalmic solution 1 drop.    [provider]  lisinopril  (ZESTRIL ) 20 MG tablet TAKE 1 TABLET DAILY 07/05/23   Alvan Dorothyann BIRCH, MD  metFORMIN  (GLUCOPHAGE ) 1000 MG tablet Take  1 tablet (1,000 mg total) by mouth 2 (two) times daily with a meal. 02/25/24   Alvan Dorothyann BIRCH, MD  Misc. Devices MISC Transfer of care to [facility].  Please provide service to her current CPAP machine for OSA at 14 cm. water pressure.  Send directly to [facility] at [facility]. 05/15/19   [provider]  NIACINAMIDE EX Apply topically.    [provider]  nystatin cream (MYCOSTATIN) Apply 1 Application topically 2 (two)  times daily. Patient taking differently: Apply 1 Application topically as needed.    [provider]  nystatin powder Apply 1 Application topically 3 (three) times daily. Patient taking differently: Apply 1 Application topically as needed.    [provider]  Omega-3 Fatty Acids (FISH OIL) 1200 MG CAPS Take 1 capsule by mouth daily.    [provider]  pantoprazole  (PROTONIX ) 40 MG tablet TAKE 1 TABLET TWICE A DAY 10/25/23   Alvan Dorothyann BIRCH, MD  rosuvastatin  (CRESTOR ) 10 MG tablet TAKE 1 TABLET ONCE A WEEK AT BEDTIME 07/05/23   Alvan Dorothyann BIRCH, MD  tirzepatide  (MOUNJARO ) 15 MG/0.5ML Pen Inject 15 mg into the skin once a week. 02/25/24   Alvan Dorothyann BIRCH, MD  Tranexamic Acid POWD by Does not apply route.    [provider]  triamcinolone  cream (KENALOG ) 0.5 % Apply 1 Application topically 3 (three) times daily.    [provider]    Family History Family History  Problem Relation Age of Onset   Diabetes Mother    Hypertension Mother    Glaucoma Mother    Cancer Mother        bone cancer   Alcohol abuse Father    Emphysema Father    Other Father        CHF   Ovarian cancer Sister    Endometrial cancer Sister        Endometrial stromal sarcoma    Social History Social History   Tobacco Use   Smoking status: Former    Types: Cigarettes   Smokeless tobacco: Never  Vaping Use   Vaping status: Never Used  Substance Use Topics   Alcohol use: Yes    Alcohol/week: 1.0 standard drink of alcohol     Types: 1 Glasses of wine per week    Comment: social   Drug use: No     Allergies   Atorvastatin, Beclomethasone, Cefaclor, Pioglitazone, Pravastatin , Sulfa antibiotics, Cephalexin, Lipitor [atorvastatin calcium ], and Statins   Review of Systems Review of Systems See HPI  Physical Exam Triage Vital Signs ED Triage Vitals  Encounter Vitals Group     BP 05/04/24 1353 134/80     Girls Systolic BP Percentile --      Girls Diastolic BP Percentile --      Boys Systolic BP Percentile --      Boys Diastolic BP Percentile --      Pulse Rate 05/04/24 1353 78     Resp 05/04/24 1353 18     Temp 05/04/24 1353 98.2 F (36.8 C)     Temp Source 05/04/24 1353 Oral     SpO2 05/04/24 1353 95 %     Weight --      Height --      Head Circumference --      Peak Flow --      Pain Score 05/04/24 1352 8     Pain Loc --      Pain Education --      Exclude from Growth Chart --    No data found.  Updated Vital Signs BP 134/80 (BP Location: Right Arm)   Pulse 78   Temp 98.2 F (36.8 C) (Oral)   Resp 18   SpO2 95%       Physical Exam Constitutional:      General: She is not in acute distress.    Appearance: She is well-developed.     Comments: Guarded neck movements  HENT:     Head:  Normocephalic and atraumatic.  Eyes:     Conjunctiva/sclera: Conjunctivae normal.     Pupils: Pupils are equal, round, and reactive to light.  Neck:     Comments: Tenderness to palpation in the right paraspinous cervical muscles and right upper body of the trapezius.  No focal neurosigns Cardiovascular:     Rate and Rhythm: Normal rate.  Pulmonary:     Effort: Pulmonary effort is normal. No respiratory distress.  Abdominal:     General: There is no distension.     Palpations: Abdomen is soft.  Musculoskeletal:        General: Normal range of motion.     Cervical back: Normal range of motion. Tenderness present.  Skin:    General: Skin is warm and dry.  Neurological:     General: No focal  deficit present.     Mental Status: She is alert.      UC Treatments / Results  Labs (all labs ordered are listed, but only abnormal results are displayed) Labs Reviewed - No data to display  EKG   Radiology No results found.  Procedures Procedures (including critical care time)  Medications Ordered in UC Medications - No data to display  Initial Impression / Assessment and Plan / UC Course  I have reviewed the triage vital signs and the nursing notes.  Pertinent labs & imaging results that were available during my care of the patient were reviewed by me and considered in my medical decision making (see chart for details).    Discussed treatment of musculoskeletal neck pain Final Clinical Impressions(s) / UC Diagnoses   Final diagnoses:  Trapezius muscle spasm     Discharge Instructions      Continue meloxicam  once a day this is an anti-inflammatory arthritis pain medicine May take tramadol  as needed for pain.  Caution dizziness and drowsiness.  May take 1 or 2 pills 3 times a day.  Continue to use your walker or cane for stability Take methocarbamol at bedtime.  This is a mild muscle relaxer.  You may take twice a day if needed May use ice or heat to area See Dr. Sharlene in follow-up   ED Prescriptions     Medication Sig Dispense Auth. Provider   methocarbamol (ROBAXIN) 500 MG tablet Take 1 tablet (500 mg total) by mouth 2 (two) times daily. 20 tablet Maranda Jamee Jacob, MD      PDMP not reviewed this encounter.   Maranda Jamee Jacob, MD 05/04/24 1946

## 2024-05-05 ENCOUNTER — Telehealth: Payer: Self-pay | Admitting: Emergency Medicine

## 2024-05-05 MED ORDER — TRAMADOL HCL 50 MG PO TABS: 50.0000 mg | ORAL_TABLET | Freq: Three times a day (TID) | ORAL | 0 refills | Status: DC | PRN

## 2024-05-05 NOTE — Telephone Encounter (Signed)
 Patient left a vm stating she checked the amount of Tramadol  she had at home and it's not enough.  Patient requesting a prescription for Tramadol  to be sent to pharmacy on file.  Please advise.

## 2024-05-05 NOTE — Telephone Encounter (Signed)
 Patient informed.

## 2024-05-05 NOTE — Telephone Encounter (Signed)
 Patient was seen yesterday and advised that she could take tramadol  as needed for her neck pain.  She thought she had enough tramadol  at home but called today to states that she was running low.  I agreed to send additional medication if needed.

## 2024-05-18 DIAGNOSIS — M1712 Unilateral primary osteoarthritis, left knee: Secondary | ICD-10-CM

## 2024-05-18 DIAGNOSIS — M48061 Spinal stenosis, lumbar region without neurogenic claudication: Secondary | ICD-10-CM

## 2024-05-18 DIAGNOSIS — Z23 Encounter for immunization: Secondary | ICD-10-CM | POA: Diagnosis not present

## 2024-05-19 ENCOUNTER — Telehealth: Payer: Self-pay | Admitting: Family Medicine

## 2024-05-19 NOTE — Telephone Encounter (Signed)
 Patient was identified as falling into the [place] Measure - Diabetes.   Patient was: Appointment already scheduled for:  06/16/24. Updated appt. Notes to add A1c Gap

## 2024-05-23 ENCOUNTER — Other Ambulatory Visit: Payer: Self-pay | Admitting: Family Medicine

## 2024-05-25 ENCOUNTER — Ambulatory Visit (INDEPENDENT_AMBULATORY_CARE_PROVIDER_SITE_OTHER)

## 2024-05-25 ENCOUNTER — Other Ambulatory Visit: Payer: Self-pay

## 2024-05-25 VITALS — BP 119/75 | Ht 60.0 in | Wt 234.0 lb

## 2024-05-25 DIAGNOSIS — M1712 Unilateral primary osteoarthritis, left knee: Secondary | ICD-10-CM

## 2024-05-25 DIAGNOSIS — M170 Bilateral primary osteoarthritis of knee: Secondary | ICD-10-CM

## 2024-05-25 DIAGNOSIS — M1711 Unilateral primary osteoarthritis, right knee: Secondary | ICD-10-CM | POA: Diagnosis not present

## 2024-05-25 MED ORDER — METHYLPREDNISOLONE ACETATE 40 MG/ML IJ SUSP
40.0000 mg | Freq: Once | INTRAMUSCULAR | Status: AC
  Administered 2024-05-25: 10 mg via INTRA_ARTICULAR

## 2024-05-25 NOTE — Progress Notes (Signed)
   Subjective:    Patient ID: Natalie Wright, female    DOB: 73 y.o., 08-28-1950   MRN: [id number]  Chief Complaint: Prolotherapy injection #3 bilateral knees  Discussed the use of AI scribe software for clinical note transcription with the patient, who gave verbal consent to proceed.  History of Present Illness  Patient with bilateral knee arthritis electing to pursue bilateral dextrose prolotherapy injections.     Objective:   Vitals:   05/25/24 1308  BP: 119/75    Bilateral knees: Minimal tenderness to palpation along medial lateral joint lines. Visible effusion.  Left intra-articular Knee Injection with Ultrasound Guidance Procedure Note [name] 1950/12/13 Indications: Pain Procedure Details Following the description of risks including infection, bleeding, damage to surrounding structures, patient provided written consent for Left knee joint injection procedure. US  was used to identify the suprapatellar pouch. Patient was sterilely prepped in the usual fashion with alcohol.  Following topical anesthetization with ethyl chloride they were injected with a solution of 25% hypertonic dextrose in saline via the superolateral approach into the suprapatellar pouch. This was well visualized under ultrasound, please see associated photographic documentation. Patient tolerated well without complication.  Precautions provided. Cleaned and dressing applied.  Right intra-articular Knee Injection with Ultrasound Guidance Procedure Note [name] Sep 17, 1950 Indications: Pain Procedure Details Following the description of risks including infection, bleeding, damage to surrounding structures, patient provided written consent for right knee joint injection procedure. US  was used to identify the suprapatellar pouch. Patient was sterilely prepped in the usual fashion with alcohol.  Following topical anesthetization with ethyl chloride they were injected with a solution of 25%  hypertonic dextrose in saline via the superolateral approach into the suprapatellar pouch. This was well visualized under ultrasound, please see associated photographic documentation. Patient tolerated well without complication.  Precautions provided. Cleaned and dressing applied.      Assessment & Plan:   Assessment & Plan  Dextrose prolotherapy injection #3 in bilateral knees for osteoarthritis.  Follow-up in January to re-assess after her appointment with Dr. Vernetta.

## 2024-05-26 ENCOUNTER — Ambulatory Visit: Admitting: Family Medicine

## 2024-06-01 MED ORDER — MELOXICAM 7.5 MG PO TABS: 7.5000 mg | ORAL_TABLET | Freq: Every day | ORAL | 1 refills | Status: DC

## 2024-06-01 NOTE — Addendum Note (Signed)
 Addended by: CHARLES ROGUE A on: 06/01/2024 08:35 AM   Modules accepted: Orders

## 2024-06-04 ENCOUNTER — Encounter: Payer: Self-pay | Admitting: Family Medicine

## 2024-06-04 ENCOUNTER — Ambulatory Visit: Admitting: Family Medicine

## 2024-06-04 VITALS — BP 118/50 | HR 63 | Ht 60.0 in | Wt 234.0 lb

## 2024-06-04 DIAGNOSIS — I10 Essential (primary) hypertension: Secondary | ICD-10-CM | POA: Diagnosis not present

## 2024-06-04 DIAGNOSIS — R42 Dizziness and giddiness: Secondary | ICD-10-CM

## 2024-06-04 DIAGNOSIS — Z7984 Long term (current) use of oral hypoglycemic drugs: Secondary | ICD-10-CM

## 2024-06-04 DIAGNOSIS — E1149 Type 2 diabetes mellitus with other diabetic neurological complication: Secondary | ICD-10-CM

## 2024-06-04 DIAGNOSIS — Z6841 Body Mass Index (BMI) 40.0 and over, adult: Secondary | ICD-10-CM

## 2024-06-04 LAB — POCT GLYCOSYLATED HEMOGLOBIN (HGB A1C): Hemoglobin A1C: 7.4 % — AB (ref 4.0–5.6)

## 2024-06-04 MED ORDER — MECLIZINE HCL 25 MG PO TABS: 25.0000 mg | ORAL_TABLET | Freq: Three times a day (TID) | ORAL | 2 refills | Status: AC | PRN

## 2024-06-04 MED ORDER — LISINOPRIL 20 MG PO TABS: 20.0000 mg | ORAL_TABLET | Freq: Every day | ORAL | 3 refills | Status: AC

## 2024-06-04 MED ORDER — METFORMIN HCL 1000 MG PO TABS: 1000.0000 mg | ORAL_TABLET | Freq: Two times a day (BID) | ORAL | 1 refills | Status: AC

## 2024-06-04 MED ORDER — CHLORTHALIDONE 25 MG PO TABS: 25.0000 mg | ORAL_TABLET | Freq: Every day | ORAL | 3 refills | Status: AC

## 2024-06-04 NOTE — Progress Notes (Signed)
 Established Patient Office Visit  Patient ID: Natalie Wright, female    DOB: 12/24/1950  Age: 73 y.o. MRN: [id number] PCP: Alvan Dorothyann BIRCH, MD  Chief Complaint  Patient presents with   Medical Management of Chronic Issues    Subjective:     HPI  Discussed the use of AI scribe software for clinical note transcription with the patient, who gave verbal consent to proceed.  History of Present Illness Natalie Wright is a 73 year old female who presents for follow-up on her knee pain and uveitis.  Knee pain and lower extremity symptoms - Chronic knee pain with ongoing issues, currently under evaluation by specialists - History of falls resulting in a fractured elbow in August or September - Receiving dextrose injections for knee pain, with the most recent injection last week - Improvement in symptoms since injections, including reduced ankle swelling and decreased nocturnal urination - Participates in strengthening exercises twice weekly  Weight management and glycemic control - Managing weight with Trulicity  and Mojano - Pattern of weight loss followed by plateaus, attributed to physiological adjustment - A1c was 6.8 in April, increased, and is now 7.4 - Adheres to a high protein, low-carbohydrate diet to support muscle mass and weight management  Ocular symptoms and uveitis - History of uveitis following cataract surgery in the right eye - Symptoms include eye pain, photophobia, redness, and a mesh-Wright pattern in vision - Received ocular injections and steroid eye drops for management - Most recent flare-up occurred last Saturday - Currently using steroid eye drops twice daily (morning and night)  Preventive care and constitutional symptoms - Received influenza vaccination three weeks ago - No chest pain or shortness of breath     ROS    Objective:     BP (!) 106/39   Pulse 63   Ht 5' (1.524 m)   SpO2 99%   BMI 45.70 kg/m    Physical Exam Vitals  and nursing note reviewed.  Constitutional:      Appearance: Normal appearance.  HENT:     Head: Normocephalic and atraumatic.  Eyes:     Conjunctiva/sclera: Conjunctivae normal.  Cardiovascular:     Rate and Rhythm: Normal rate and regular rhythm.  Pulmonary:     Effort: Pulmonary effort is normal.     Breath sounds: Normal breath sounds.  Skin:    General: Skin is warm and dry.  Neurological:     Mental Status: She is alert.  Psychiatric:        Mood and Affect: Mood normal.      No results found for any visits on 06/04/24.    The 10-year ASCVD risk score (Arnett DK, et al., 2019) is: 17.6%    Assessment & Plan:   Problem List Items Addressed This Visit       Cardiovascular and Mediastinum   Essential hypertension, benign   Relevant Medications   lisinopril  (ZESTRIL ) 20 MG tablet   chlorthalidone  (HYGROTON ) 25 MG tablet   Other Relevant Orders   CMP14+EGFR   B12   CBC     Endocrine   Type 2 diabetes mellitus with neurological complications (HCC) - Primary   Relevant Medications   metFORMIN  (GLUCOPHAGE ) 1000 MG tablet   lisinopril  (ZESTRIL ) 20 MG tablet   Other Relevant Orders   POCT glycosylated hemoglobin (Hb A1C)   CMP14+EGFR   B12   CBC   Other Visit Diagnoses       Vertigo       Relevant Medications  meclizine  (ANTIVERT ) 25 MG tablet   Other Relevant Orders   CBC       Assessment and Plan Assessment & Plan Type 2 diabetes mellitus with neurological complications A1c improved to 7.4. Weight loss plateau noted with Mounjaro  use. - Continue Mounjaro  15 MG/0.5ML Pen. - Encouraged increased protein intake to maintain muscle mass. - Advised low carbohydrate diet, prioritizing protein and vegetables. - Scheduled follow-up in three months.  Obesity Weight loss plateau noted with Mounjaro  use. Engaging in strengthening exercises. - Continue strengthening exercises twice a week. - Encouraged dietary modifications with increased protein and  low carbohydrate intake.  Essential hypertension  Right lumbar radiculopathy Improvement with dextrose injections. Considering PRP therapy if surgery is not pursued. - Continue current treatment regimen. - Will consider PRP therapy if surgery is not pursued.  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 Recent flare-up after steroid eye drops. - Continue steroid eye drops as prescribed. - Follow up with ophthalmologist Dr. Stewart for further management.    Return in about 3 months (around 09/02/2024) for Diabetes follow-up.    [name], MD [facility] at [facility]

## 2024-06-04 NOTE — Patient Instructions (Signed)
 A1C is 7.4 today!! Much better  Keep working at incorporating protein in your diet.

## 2024-06-04 NOTE — Assessment & Plan Note (Signed)
 She is doing well. Down to BMI 45.

## 2024-06-04 NOTE — Assessment & Plan Note (Signed)
 Looks awesome

## 2024-06-05 ENCOUNTER — Ambulatory Visit: Payer: Self-pay | Admitting: Physician Assistant

## 2024-06-05 LAB — CMP14+EGFR
ALT: 11 IU/L (ref 0–32)
AST: 18 IU/L (ref 0–40)
Albumin: 4.3 g/dL (ref 3.8–4.8)
Alkaline Phosphatase: 87 IU/L (ref 49–135)
BUN/Creatinine Ratio: 23 (ref 12–28)
BUN: 15 mg/dL (ref 8–27)
Bilirubin Total: 0.4 mg/dL (ref 0.0–1.2)
CO2: 27 mmol/L (ref 20–29)
Calcium: 9.5 mg/dL (ref 8.7–10.3)
Chloride: 100 mmol/L (ref 96–106)
Creatinine, Ser: 0.66 mg/dL (ref 0.57–1.00)
Globulin, Total: 2.1 g/dL (ref 1.5–4.5)
Glucose: 91 mg/dL (ref 70–99)
Potassium: 3.6 mmol/L (ref 3.5–5.2)
Sodium: 143 mmol/L (ref 134–144)
Total Protein: 6.4 g/dL (ref 6.0–8.5)
eGFR: 93 mL/min/1.73 (ref >59)

## 2024-06-05 LAB — CBC
Hematocrit: 44.6 % (ref 34.0–46.6)
Hemoglobin: 14.4 g/dL (ref 11.1–15.9)
MCH: 29.4 pg (ref 26.6–33.0)
MCHC: 32.3 g/dL (ref 31.5–35.7)
MCV: 91 fL (ref 79–97)
Platelets: 348 x10E3/uL (ref 150–450)
RBC: 4.89 x10E6/uL (ref 3.77–5.28)
RDW: 13 % (ref 11.7–15.4)
WBC: 12 x10E3/uL — ABNORMAL HIGH (ref 3.4–10.8)

## 2024-06-05 LAB — VITAMIN B12: Vitamin B-12: 566 pg/mL (ref 232–1245)

## 2024-06-05 NOTE — Progress Notes (Signed)
 Your lab work is within acceptable range and there are no concerning findings.   ?

## 2024-06-05 NOTE — Progress Notes (Signed)
 Route to PCP

## 2024-06-08 DIAGNOSIS — E113213 Type 2 diabetes mellitus with mild nonproliferative diabetic retinopathy with macular edema, bilateral: Secondary | ICD-10-CM | POA: Diagnosis not present

## 2024-06-08 DIAGNOSIS — H43811 Vitreous degeneration, right eye: Secondary | ICD-10-CM | POA: Diagnosis not present

## 2024-06-08 DIAGNOSIS — H2512 Age-related nuclear cataract, left eye: Secondary | ICD-10-CM | POA: Diagnosis not present

## 2024-06-08 DIAGNOSIS — H2011 Chronic iridocyclitis, right eye: Secondary | ICD-10-CM | POA: Diagnosis not present

## 2024-06-08 DIAGNOSIS — H401130 Primary open-angle glaucoma, bilateral, stage unspecified: Secondary | ICD-10-CM | POA: Diagnosis not present

## 2024-06-09 DIAGNOSIS — R7989 Other specified abnormal findings of blood chemistry: Secondary | ICD-10-CM | POA: Diagnosis not present

## 2024-06-09 DIAGNOSIS — R0902 Hypoxemia: Secondary | ICD-10-CM | POA: Diagnosis not present

## 2024-06-09 DIAGNOSIS — R2990 Unspecified symptoms and signs involving the nervous system: Secondary | ICD-10-CM | POA: Diagnosis not present

## 2024-06-09 DIAGNOSIS — I639 Cerebral infarction, unspecified: Secondary | ICD-10-CM | POA: Diagnosis not present

## 2024-06-09 DIAGNOSIS — R000 Tachycardia, unspecified: Secondary | ICD-10-CM | POA: Diagnosis not present

## 2024-06-09 DIAGNOSIS — E1165 Type 2 diabetes mellitus with hyperglycemia: Secondary | ICD-10-CM | POA: Diagnosis not present

## 2024-06-09 DIAGNOSIS — I10 Essential (primary) hypertension: Secondary | ICD-10-CM | POA: Diagnosis not present

## 2024-06-09 DIAGNOSIS — R42 Dizziness and giddiness: Secondary | ICD-10-CM | POA: Diagnosis not present

## 2024-06-09 DIAGNOSIS — J302 Other seasonal allergic rhinitis: Secondary | ICD-10-CM | POA: Diagnosis not present

## 2024-06-10 DIAGNOSIS — I358 Other nonrheumatic aortic valve disorders: Secondary | ICD-10-CM | POA: Diagnosis not present

## 2024-06-10 DIAGNOSIS — Z6841 Body Mass Index (BMI) 40.0 and over, adult: Secondary | ICD-10-CM | POA: Diagnosis not present

## 2024-06-10 DIAGNOSIS — Z7409 Other reduced mobility: Secondary | ICD-10-CM | POA: Diagnosis not present

## 2024-06-10 DIAGNOSIS — K30 Functional dyspepsia: Secondary | ICD-10-CM | POA: Diagnosis not present

## 2024-06-10 DIAGNOSIS — G4733 Obstructive sleep apnea (adult) (pediatric): Secondary | ICD-10-CM | POA: Diagnosis not present

## 2024-06-10 DIAGNOSIS — E785 Hyperlipidemia, unspecified: Secondary | ICD-10-CM | POA: Diagnosis not present

## 2024-06-10 DIAGNOSIS — I471 Supraventricular tachycardia, unspecified: Secondary | ICD-10-CM | POA: Diagnosis not present

## 2024-06-10 DIAGNOSIS — I639 Cerebral infarction, unspecified: Secondary | ICD-10-CM | POA: Diagnosis not present

## 2024-06-10 DIAGNOSIS — E876 Hypokalemia: Secondary | ICD-10-CM | POA: Diagnosis not present

## 2024-06-10 DIAGNOSIS — E66813 Obesity, class 3: Secondary | ICD-10-CM | POA: Diagnosis not present

## 2024-06-10 DIAGNOSIS — I10 Essential (primary) hypertension: Secondary | ICD-10-CM | POA: Diagnosis not present

## 2024-06-10 DIAGNOSIS — E1165 Type 2 diabetes mellitus with hyperglycemia: Secondary | ICD-10-CM | POA: Diagnosis not present

## 2024-06-10 NOTE — Telephone Encounter (Signed)
 Please call and see how she is doing.  Haver her check BP at home. I want to make sure not going too low.

## 2024-06-10 NOTE — Telephone Encounter (Signed)
-----   Message from Nurse Suzen DEL sent at 06/10/2024  3:15 PM EST ----- Noticed that this patient was admitted yesterday to hospital for CVA  CBC is showing slightly elevated WBC - I just wanted to be sure that you were aware of this before I attempt to contact the patient.  ----- Message ----- From: Alvan Dorothyann BIRCH, MD Sent: 06/05/2024  12:31 PM EST To: [facility]  Your lab work is within acceptable range and there are no concerning findings.    ----- Message ----- From: Rebecka Memos Lab Results In Sent: 06/05/2024   7:38 AM EST To: Dorothyann BIRCH Alvan, MD

## 2024-06-11 DIAGNOSIS — E1165 Type 2 diabetes mellitus with hyperglycemia: Secondary | ICD-10-CM | POA: Diagnosis not present

## 2024-06-11 DIAGNOSIS — I639 Cerebral infarction, unspecified: Secondary | ICD-10-CM | POA: Diagnosis not present

## 2024-06-11 DIAGNOSIS — K30 Functional dyspepsia: Secondary | ICD-10-CM | POA: Diagnosis not present

## 2024-06-11 DIAGNOSIS — E876 Hypokalemia: Secondary | ICD-10-CM | POA: Diagnosis not present

## 2024-06-11 DIAGNOSIS — I10 Essential (primary) hypertension: Secondary | ICD-10-CM | POA: Diagnosis not present

## 2024-06-11 DIAGNOSIS — G4733 Obstructive sleep apnea (adult) (pediatric): Secondary | ICD-10-CM | POA: Diagnosis not present

## 2024-06-11 DIAGNOSIS — Z6841 Body Mass Index (BMI) 40.0 and over, adult: Secondary | ICD-10-CM | POA: Diagnosis not present

## 2024-06-11 DIAGNOSIS — E785 Hyperlipidemia, unspecified: Secondary | ICD-10-CM | POA: Diagnosis not present

## 2024-06-11 DIAGNOSIS — I471 Supraventricular tachycardia, unspecified: Secondary | ICD-10-CM | POA: Diagnosis not present

## 2024-06-11 DIAGNOSIS — Z7409 Other reduced mobility: Secondary | ICD-10-CM | POA: Diagnosis not present

## 2024-06-11 DIAGNOSIS — E66813 Obesity, class 3: Secondary | ICD-10-CM | POA: Diagnosis not present

## 2024-06-11 NOTE — Discharge Summary (Signed)
 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     Discharge Medications     Current Discharge Medication List     START taking these medications      Details  apixaban 5 mg tablet Commonly known as: ELIQUIS  Take one tablet (5 mg dose) by mouth 2 (two) times daily. Indication: Atrial Fibrillation Quantity: 60 tablet   aspirin 81 mg chewable tablet Start taking on: June 12, 2024 Replaces: aspirin 325 mg tablet  Chew one tablet (81 mg dose) by mouth daily. Quantity: 30 tablet       CONTINUE these medications which have CHANGED      Details  diltiazem  360 MG 24 hr capsule Commonly known as: CARDIZEM  CD What changed: when to take this  Take one capsule (360 mg dose) by mouth daily. Quantity: 90 capsule   pantoprazole  sodium 40 mg tablet Commonly known as: PROTONIX  What changed: when to take this  Take one tablet (40 mg dose) by mouth 30 (thirty) minutes before breakfast and at bedtime. Quantity: 180 tablet       CONTINUE these medications which have NOT CHANGED      Details  acetaminophen  650 MG CR  tablet Commonly known as: TYLENOL  ARTHRITIS,MAPAP  Take one tablet (650 mg dose) by mouth every 8 (eight) hours as needed for Pain.   b complex vitamins capsule  Take one capsule by mouth daily.   cetirizine  10 mg tablet Commonly known as: ZYRTEC   Take one tablet (10 mg dose) by mouth at bedtime.   chlorthalidone  25 mg tablet  Take one tablet (25 mg dose) by mouth every morning.   clobetasol  propionate 0.05% cream Commonly known as: TEMOVATE   Apply one Application topically daily as needed. Under breast with groin   difluprednate 0.05% Emul Commonly known as: DUREZOL  Place one drop into the right eye 4 (four) times daily.   DULoxetine  HCl 30 mg capsule Commonly known as: CYMBALTA   Take one capsule (30 mg dose) by mouth every morning.   empagliflozin  25 mg Tabs tablet Commonly known as: JARDIANCE   Take one tablet (25 mg dose) by mouth every morning.   Ferrous Sulfate Dried 200 (65 Fe) MG Tabs  Take 1 tablet by mouth daily.   finasteride 1 mg tablet Commonly known as: PROPECIA  Take one tablet (1 mg dose) by mouth daily.   fish oil-omega-3 fatty acids 1000 MG capsule  Take two capsules (2 g dose) by mouth daily.   fluconazole  150 mg tablet Commonly known as: DIFLUCAN   Take one tablet (150  mg dose) by mouth as needed.   insulin  glargine 100 Unit/mL injection Commonly known as: LANTUS   Inject twenty five Units to thirty Units into the skin daily.   Insulin  Pen Needle 29G X Misc Commonly known as: PENFINE,EASY TOUCH  50 Units by Does not apply route 2 (two) times daily. Quantity: 200 each   latanoprost 0.005% ophthalmic solution Commonly known as: XALATAN  Place one drop into both eyes at bedtime.   lisinopril  20 mg tablet Commonly known as: PRINIVIL ,ZESTRIL   Take one tablet (20 mg dose) by mouth daily.   MEDI-MECLIZINE  25 mg tablet Generic drug: meclizine  HCl  Take one tablet (25 mg dose) by mouth 3 (three) times a day as needed for Nausea or  Dizziness.   metformin  1000 MG tablet Commonly known as: GLUCOPHAGE   Take one tablet (1,000 mg dose) by mouth 2 (two) times daily with meals.   minoxidil 2.5 mg tablet Commonly known as: LONITEN  Take one tablet (2.5 mg dose) by mouth daily.   * Misc. Devices Misc  Reduce the cpap pressure to 14cm water.  Please fax order to [facility] and call to ensure the pressure will be reduced (this did not happen in Nov. 2018, although it looks like the order was faxed to [facility]). Quantity: 1 each   * Misc. Devices Misc  Transfer of care to [facility].  Please provide service to her current CPAP machine for OSA at 14 cm. water pressure.  Send directly to [place] at [facility]. Quantity: 1 each   * Misc. Devices Misc  New Resmed AirSense 11 CPAP machine with mask and supplies per patient preference for OSA.  CPAP therapy at 14 cm. water pressure.  Send to the current DME. Quantity: 1 each   PAPAYA ENZYME PO  Take 1 tablet by mouth daily.   rosuvastatin  calcium  10 mg tablet Commonly known as: CRESTOR   Take one tablet (10 mg dose) by mouth once a week. Qhs on wednesday   tirzepatide  15 mg/0.5 mL Soaj injection Commonly known as: MOUNJARO   Inject 0.5 mLs (15 mg dose) into the skin once a week. Mondays   triamcinolone  acetonide 0.1% cream Commonly known as: KENalog   Apply topically 2 (two) times daily.      * * This list has 3 medication(s) that are the same as other medications prescribed for you. Read the directions carefully, and ask your doctor or other care provider to review them with you.         * You might also be taking other medications not listed above. If you have questions about any of your other medications, talk to the person who prescribed them or your Primary Care Provider.          STOP taking these medications    aspirin 325 mg tablet Replaced by: aspirin 81 mg chewable tablet   clopidogrel  bisulfate 75 mg tablet Commonly known as: PLAVIX    ketoROLAC   tromethamine  0.5% ophthalmic solution Commonly known as: ACULAR         Physical Exam  BP (!) 125/58 (BP Location: Right Upper Arm, Patient Position: Lying)   Pulse 58   Temp 98.1 F (36.7 C) (Oral)   Resp 20   Ht 1.524 m (5')   Wt 105.1 kg (231 lb 11.3 oz)   SpO2 94%   BMI 45.25 kg/m   General: No acute distress elderly female lying in hospital bed  Head, Eyes, Ears, Nose, Throat: Normocephalic, atraumatic, Conjunctiva normal, Oropharynx moist and clear Neck:  Supple  with normal range of motion, No lymphadenopathy  Cardiovascular:  Regular, rate, and rhythm Lungs:  Clear to auscultation bilaterally Abdomen:  Bowel sounds Present, Soft, and Non-tender Skin:  No Focal Rashes  Extremities:  No clubbing, cyanosis, or edema.    Neurologic: Mental Status: Awake, Alert, and Follows commands Speech: Normal Orientation to: Person, Place, and Time CN II-XII: normal Visual: Full to finger counting Pupils: Equal, Right Pupil 3 mm, Left Pupil 3 mm, and Reactive EOM: Intact Corneal Reflex: Not tested in this awake patient Face: Tongue midline, Palate elevates symmetrically, V1-V3 intact Cough/gag Reflex: Not tested in this awake patient Motor: 5/5 x 4 ext Sensation: Left normal and Right normal Reflexes: Not Tested Toes: Not tested Coordination: No Ataxia Present Gait: Unable to assess  Labs   Recent Results (from the past 24 hours)  Echocardiogram Complete W Enhancing Agent   Collection Time: 06/10/24  4:27 PM  Result Value Ref Range   Patient Height 60    Patient Weight 3,724.89    Blood Pressure Monitoring 152/67    Pulse 56    Resp Care Set Rate Venous 15    Calculated BMI 45.5    BSA 2.11 m2   Pulse Ox 91    LA M-L Dimension (A4C) 5.570 cm   LA S-I Dimension (A2C) 6.010 cm   LA Area Sys (A2C) 18.400 cm2   LA Area Sys (A4C) 24.200 cm2   LA ESV (A2C) 49.200 mL   LA ESV (A4C) 76.900 mL   LA A-P Dimension 4.700 cm   Left Atrium Dimension 2D 4.700 cm   LA ESV  Index (A4C) 38.600 ml/m2   LA Volume Index (BP) 32.0 mL/m2   LA/Ao Ratio 1.620 no units   LV Length Dias (A4C) 8.350 cm   LV Area Dias (A4C) 36.000 cm2   LVEDV 126.0 ml   LV Length Sys (A4C) 6.720 cm   LV Systolic Volume (BP) 50.200 mL   LVES V 54.4 ml   IVSd 0.7 0.7 - 1.3 cm   IVS 0.734 cm   LVIDd 5.140 4.90 - 6.80 cm   LVIDD 5.14 cm   LVIDs 3.600 2.88 - 4.36 cm   LVOT Mean Gradient 1.0 mmHg   LVOT Peak VTI 18.300 cm   LVOT Mean Vel 48.900 cm/s   LVOT Peak Velocity 0.687 m/s   LVOT Peak Gradient 2.000 mmHg   LVPWd 0.733 0.64 - 1.18 cm   LVPWD 0.733 cm   LV Peak Diastolic Tissue Velocity During (A Sys Lat) 12.100 cm/s   MV E' Lateral Tissue Vel 12.100 cm/s   LV Peak Diastolic Tissue Velocity During (A Sys Med) 8.050 cm/s   MV E' Tissue Velocity Lateral 8.050 cm/s   A4C EF 61 %   Left Ventricular EF by 2-D Biplane by Method of Disks 65.700 %   Left Ventricular EF by Teichholz Method 56.800 %   E/E' Lateral Ratio 5.700 no units   E/E' Ratio 8.60 no units   LVFS 30.000 %   Interventricular Septum/Left Ventricular PWD by 2D 1.000 no units   LV Stroke Volume 76.80 mL   Aortic valve mean velocity 1.070 m/s   AoV Mean Gradient 5.000 mmHg   Aortic valve velocity time integral 0.385 m   Aortic valve mean velocity 1.070 m/s   AV VMAX 1.500 m/s   AoV Peak Velocity 1.500 m/s   AoV Peak Gradient 9.000 mmHg   AV index (prosthetic) 0.460 no units   Aortic root 2.900 cm  Aortic annulus 2.900 cm   MV DT 239.000 ms   E wave deceleration time 239.000 msec   MV A Velocity 54.800 cm/s   MV Peak A Vel 0.548 m/s   MV E Velocity 69.500 cm/s   MV Peak E Vel 69.500 cm/s   MV E/A 1.300 no units   TAPSE 2.330 cm   LV Mass 128.000 g   ZLVPWD -0.86    ZLVIDS 0.12    ZLVIDD -1.16    ZIVSD -1.25    LV Diastolic Volume (BP) 127.0 mL  POCT Glucose 30  minutes before meals and at bedtime   Collection Time: 06/10/24  4:42 PM  Result Value Ref Range   Glucose, POC 91 70 - 99 mg/dL   OPERATOR  ID [id number]    INSTRUMENT ID [id number]   POCT Glucose 30  minutes before meals and at bedtime   Collection Time: 06/10/24  9:25 PM  Result Value Ref Range   Glucose, POC 154 (H) 70 - 99 mg/dL   OPERATOR ID [id number]    INSTRUMENT ID [id number]   CBC   Collection Time: 06/11/24 12:34 AM  Result Value Ref Range   WBC 8.8 4.0 - 10.5 thou/mcL   RBC 4.80 3.93 - 5.22 million/mcL   HGB 13.5 11.2 - 15.7 gm/dL   HCT 57.7 65.8 - 55.0 %   MCV 87.9 79.4 - 94.8 fL   MCH 28.1 25.6 - 32.2 pg   MCHC 32.0 (L) 32.2 - 35.5 gm/dL   Plt Ct 688 849 - 599 thou/mcL   RDW SD 43.5 35.1 - 46.3 fL   MPV 10.1 9.4 - 12.4 fL   NRBC% 0.0 0.0 - 0.2 /100WBC   Absolute NRBC Count 0.00 0.00 - 0.01 thou/mcL  Basic Metabolic Panel   Collection Time: 06/11/24 12:34 AM  Result Value Ref Range   Na 143 136 - 146 mmol/L   Potassium 3.5 (L) 3.7 - 5.4 mmol/L   Cl 106 97 - 108 mmol/L   CO2 25 20 - 32 mmol/L   AGAP 12 7 - 16 mmol/L   Glucose 127 (H) 65 - 99 mg/dL   BUN 11 8 - 27 mg/dL   Creatinine 9.43 (L) 9.42 - 1.00 mg/dL   Ca 9.0 8.6 - 89.7 mg/dL   BUN/CREAT RATIO 80.3 11.0 - 26.0   eGFR 97 >=60 mL/min/1.76m2  Magnesium   Collection Time: 06/11/24 12:34 AM  Result Value Ref Range   Mg 1.6 1.6 - 2.6 mg/dL  Phosphorus   Collection Time: 06/11/24 12:34 AM  Result Value Ref Range   Phos 2.9 2.5 - 4.5 mg/dL  If new NPO order, complete POCT Glucose every 4 hours   Collection Time: 06/11/24  8:41 AM  Result Value Ref Range   Glucose, POC 149 (H) 70 - 99 mg/dL   OPERATOR ID [id number]    INSTRUMENT ID [id number]   If new NPO order, complete POCT Glucose every 4 hours   Collection Time: 06/11/24 12:18 PM  Result Value Ref Range   Glucose, POC 148 (H) 70 - 99 mg/dL   OPERATOR ID [id number]    INSTRUMENT ID [id number]     Last Resulted Components     Date/Time Component Value Flag Units Reference Range Lab Status   06/10/24 0022 CHOL 164 -- mg/dL 899 - 800 mg/dL Final result   87/89/74 0022 HDL 54 -- mg/dL  >=60 mg/dL Final result   87/89/74 0022 LDL 94 -- mg/dL 0 - 99 mg/dL Final  result   06/10/24 0022 TRIG 78 -- mg/dL 0 - 850 mg/dL Final result   87/89/74 0022 HGBA1C 7.2* High % 4.8 - 5.6 % Final result   06/11/24 1218 GLUCOSE 148* High mg/dL 70 - 99 mg/dL Final result       Images  CT Head WO Contrast Result Date: 06/11/2024 INDICATION:    Other Stroke, status post thrombolytic, COMPARISON:   MRI brain June 10, 2024 and head CT June 09, 2024 TECHNIQUE:    Multiple axial images obtained from the skull base to the vertex without IV contrast  were obtained on  06/10/2024 7:43 PM FINDINGS: The sulci, cisterns and ventricles are normal. There is no extra-axial fluid collection or intracranial hemorrhage. No mass, mass effect or midline shift. Preservation gray-white junction without sign of acute ischemic change. The cerebellopontine angles are normal. The attenuation brain parenchyma is unremarkable. Calvarium intact. Scattered calcified plaque carotid siphons and vertebrobasilar system. Stable exam.   IMPRESSION: Stable exam. No acute intracranial disease. No acute ischemic change Electronically Signed by: Ozell Mervyn Raddle. on 06/11/2024 7:59 AM  Echocardiogram Complete W Enhancing Agent Result Date: 06/10/2024   Left Ventricle: Systolic function is normal. EF: 55-60%.   Left Atrium: Injection of agitated saline documents no interatrial shunt.   MRI Head WO Contrast Result Date: 06/10/2024 INDICATION: Stroke. TECHNIQUE: Multiplanar, multisequence MR imaging of the brain without IV contrast. COMPARISON: Head CT without contrast and CTA head and neck 06/09/2024. FINDINGS: No significant interval change. Predominantly empty sella turcica, nonspecific in this age range. Minimal leukoaraiosis. No significant brain parenchymal signal abnormality. Diffusion imaging shows no hyperacute, acute, or early subacute infarction. No intracranial mass. No hydrocephalus. The marrow signal pattern is  within normal limits.   IMPRESSION: No acute intracranial abnormality. Electronically Signed by: Chad Holder, MD on 06/10/2024 7:53 AM  XR Chest Ap Portable Result Date: 06/09/2024 INDICATION: Weakness TECHNIQUE: XR CHEST AP PORTABLE. Comparison 05/15/2010. FINDINGS: Lungs are clear. The cardiac and mediastinal contours are normal.   IMPRESSION: No radiographic evidence of acute cardiopulmonary disease. Electronically Signed by: Glendia Guillaume, MD on 06/09/2024 9:46 PM  CT Code Stroke Head Neck Angio Result Date: 06/09/2024 COMPARISON: 06/09/2024 INDICATION: Fatigue; Code Stroke 1 hr pta called daughter saying she felt faint. With EMS daughter said she has been significantly slow to respond and lethargic. Not been sick recently. Only saying 1 word at a time. MD quick looking at 1845   TECHNIQUE:  CT CODE STROKE HEAD NECK ANGIO   75 mL  IOPAMIDOL  76 % IV SOLN Radiation dose reduction was utilized (automated exposure control, mA or kV adjustment based on patient size, or iterative image reconstruction). Image post processing was then performed creating multi planar 2D and angiographic 3D MIP, shaded surface rendering or 3D volume rendering images for review and comparison. FINDINGS: Degree of stenosis is determined using NASCET measurement technique: Severe: 70-99% Moderate: 50-69%. Mild: Less than 50%. CTA NECK #  Aortic arch and brachiocephalic vessels: No significant abnormality. #  Right vertebral artery: Normal #  Left vertebral artery: Normal #  Right carotid artery: Normal. #  Left carotid artery: Normal. CTA HEAD #  Anterior cerebral arteries: Normal. #  Distal internal carotid arteries:  Normal #  Right middle cerebral arteries: Normal. #  Left middle cerebral arteries: Normal. #  Right posterior cerebral arteries: Normal. #  Left posterior cerebral arteries: Normal. #  Basilar artery: Normal #  Right distal vertebral artery: Normal. #  Left distal vertebral artery: Normal. #  Dural sinuses: Patent #   Additional findings: No intracranial aneurysm or dissection identified.  Nonspecific small bilateral 5 mm upper lobe pulmonary nodules.   IMPRESSION: CTA HEAD NECK 1.  No large vessel occlusion identified. 2.  No intracranial aneurysm or dissection identified. 3.  Nonspecific small bilateral 5 mm upper lobe pulmonary nodules.  Follow-up per Fleischner guidelines. Electronically Signed by: Charlie Patch, MD on 06/09/2024 9:25 PM  CT Code Stroke Head WO Contrast Result Date: 06/09/2024 COMPARISON: 02/17/2017 INDICATION:Stroke/TIA TECHNIQUE:  CT CODE STROKE HEAD WO IV CONTRAST FINDINGS: CT HEAD Motion artifact. Negative for intracranial hemorrhage. No midline shift or acute mass effect. Ventricles, cisterns, and sulci are unremarkable.  Negative for hydrocephalus.  Mild periventricular and deep white matter hypodensities, statistically sequela of old small vessel ischemic disease. Visualized orbits are unremarkable. Visualized paranasal sinuses are clear. Mastoid air cells are clear. No displaced or depressed calvarial fracture identified.   IMPRESSION: CT HEAD No acute intracranial abnormality identified. Electronically Signed by: Charlie Patch, MD on 06/09/2024 7:06 PM   Discharge Instructions  Discharge Teaching Handouts: Stroke  Activity after leaving the hospital: Progressive activity as tolerated  Diet: Diabetic  Call EMS for any signs or symptoms of a stroke  Follow-up appointments: Follow-up with Natalie JONETTA Byars, MD on 06/18/2024 at 3:45pm. Follow up with [facility] on 06/15/2024 at 2:30pm.    Hospital Course  Natalie Wright is a 73 year old female with a PMH of HTN, TIA, vertigo, Type II DM, delayed gastric emptying, OSA on CPAP, and lactose intolerance who presented to [facility] ED on 12/9 with a cc of acute onset speech difficulty and dizziness/lightheadedness. LKW 1700. Patient was transported to the ED where a Code Stroke was initiated. CTH was negative for acute  abnormality. She was evaluated by teleneurology who noted an NIHSS of 3. She was a candidate for TNK, and TNK was administered at 1947. CTA H/N noted no LVO. She was then transferred to [facility] NSICU for further monitoring and stroke workup.  At [facility], Ms. Jaroszewski's neurological symptoms resolved and she returned back to her neurologic baseline by the morning. She was also noted to be in atrial fibrillation, which was noted on telemetry, and has since converted back to sinus rhythm. MRI brain was without acute abnormality and 24hr CTH was without hemorrhage or evidence of ischemia. Echocardiogram was completed which noted an EF of 55-60% and did not note interatrial shunt. Laboratory workup noted HgbA1c of 7.2% and LDL 94. She was administered sliding scale insulin  for glucose control while inpatient and continued on her home lipid lowering therapy (hx of statin intolerance, takes Crestor  once weekly). She was started on Eliquis for newly diagnosed afib to reduce risk of future stroke (CHADS-VASc Score of 6, 9.7% annual stroke risk). She was evaluated by physical therapy and occupational therapy who did notice some orthostatic hypotension, which the patient has been educated on. They recommended ongoing PT for gait training. After discussion with the patient, she feels confident in returning home with the current plan of care. She will be discharged home this afternoon with appropriate follow up and referrals as noted above.   PHQ9: 0 Initial NIHSS 3 (per teleneurology) Discharge NIHSS 0 Modified Rankin Scale 0 (No symptoms at all)  Time spent in discharge process: 42 minutes  Dr. Paullette has seen and examined the patient and formulated the plan personally.  The note was written for Dr. Paullette by Mace Crate, NP on 06/11/2024 3:13 PM  Electronically signed:  Mace LOISE Crate, DNP, FNP 06/11/2024 / 3:13 PM  *Some images could not be shown.

## 2024-06-11 NOTE — Telephone Encounter (Signed)
-----   Message from [name], MD sent at 06/10/2024  9:05 PM EST -----   ----- Message ----- From: Alpheus Suzen SQUIBB, LPN Sent: 87/89/7974   3:15 PM EST To: [name], MD  Noticed that this patient was admitted yesterday to hospital for CVA  CBC is showing slightly elevated WBC - I just wanted to be sure that you were aware of this before I attempt to contact the patient.  ----- Message ----- From: Byars Dorothyann JONETTA, MD Sent: 06/05/2024  12:31 PM EST To: [facility]  Your lab work is within acceptable range and there are no concerning findings.    ----- Message ----- From: Rebecka Memos Lab Results In Sent: 06/05/2024   7:38 AM EST To: [name], MD

## 2024-06-11 NOTE — Telephone Encounter (Signed)
 Spoke with the patient who is aware of results. States she is hopefully getting discharged this afternoon. Will also keep a log of BP readings. Recommended she check at least twice daily.

## 2024-06-12 ENCOUNTER — Telehealth: Payer: Self-pay

## 2024-06-12 NOTE — Transitions of Care (Post Inpatient/ED Visit) (Signed)
° °  06/12/2024  Name: Natalie Wright MRN: [id number] DOB: 03/24/1951  Today's TOC FU Call Status: Today's TOC FU Call Status:: Unsuccessful Call (1st Attempt) Unsuccessful Call (1st Attempt) Date: 06/12/24  Attempted to reach the patient regarding the most recent Inpatient/ED visit.  Follow Up Plan: Additional outreach attempts will be made to reach the patient to complete the Transitions of Care (Post Inpatient/ED visit) call.   Shona Prow RN, CCM [place]  VBCI-Population Health RN Care Manager [phone]

## 2024-06-12 NOTE — Telephone Encounter (Signed)
 I Can sign the orders.

## 2024-06-12 NOTE — Transitions of Care (Post Inpatient/ED Visit) (Signed)
° °  06/12/2024  Name: Natalie Wright MRN: [id number] DOB: 09-15-1950  Today's TOC FU Call Status: Today's TOC FU Call Status:: Unsuccessful Call (2nd Attempt) Unsuccessful Call (2nd Attempt) Date: 06/12/24  Attempted to reach the patient regarding the most recent Inpatient/ED visit.  Follow Up Plan: Additional outreach attempts will be made to reach the patient to complete the Transitions of Care (Post Inpatient/ED visit) call.   Shona Prow RN, CCM [place]  VBCI-Population Health RN Care Manager [phone]

## 2024-06-12 NOTE — Telephone Encounter (Signed)
 Copied from CRM #[id number]. Topic: General - Other >> Jun 11, 2024  3:21 PM Jasmin G wrote: Reason for CRM: Ms. Rocky from [facility] requested a call back at [phone] to discuss PT.

## 2024-06-12 NOTE — Telephone Encounter (Signed)
 Jake was recently in the hospital due to a stroke. The hospital ordered PT for patient. [facility]/Erin wants to know if Dr Alvan will sign the orders.

## 2024-06-12 NOTE — Telephone Encounter (Signed)
 This task has been completed as requested. Please review other TE for additional information.   Left a vm msg for Sam at [facility] [phone]) regarding the verbal orders for physical therapy and OT requests.  Direct call back information provided. No further action needed.

## 2024-06-12 NOTE — Telephone Encounter (Signed)
 Home health advised.  ?

## 2024-06-12 NOTE — Telephone Encounter (Signed)
 Copied from CRM #[id number]. Topic: Clinical - Home Health Verbal Orders >> Jun 12, 2024  4:38 PM Delon DASEN wrote: Caller/Agency: Sam with [facility] Callback Number: [phone] secure vm Service Requested: Physical Therapy Frequency: 1x wk  1wk,  2x wk for 1 wk, 1x wk for 7 wks-   OT eval and treat Any new concerns about the patient? No- was hospitalized for stroke

## 2024-06-15 ENCOUNTER — Telehealth: Payer: Self-pay

## 2024-06-15 NOTE — Progress Notes (Signed)
 Dear Natalie JONETTA Byars, MD,   Thank you for the opportunity to see Natalie Wright in neurological consultation.  Below, you will find my History and Physical report.  Please do not hesitate to contact my office if you have any questions or concerns.  Thank you for allowing me to participate in her care with you.    Sincerely, Warren Flicker, FNP  Subjective   Patient ID:  Natalie Wright is a 73 y.o. (DOB Mar 10, 1951) female   Date of Admission-Discharge:  December 9-11, 2025 Presenting symptoms:  Per hospital notes:  acute onset speech difficulty and dizziness/lightheadedness. LKW 1700. Patient was transported to the ED where a Code Stroke was initiated. CTH was negative for acute abnormality. She was evaluated by teleneurology who noted an NIHSS of 3.   Acute Stroke Treatment Provided: Received IV thrombolytic therapy   MRI Head WO Contrast IMPRESSION: No acute intracranial abnormality. Electronically Signed by: Chad Holder, MD on 06/10/2024 7:53 AM   CT angiography:  06/09/24 1.  No large vessel occlusion identified. 2.  No intracranial aneurysm or dissection identified.   The most recent echocardiogram results, from Hospital Encounter on 06/09/24   Echocardiogram Complete W Enhancing Agent  Impression Left Ventricle: Systolic function is normal. EF: 55-60%.   Left Atrium: Injection of agitated saline documents no interatrial shunt.   EKG: atrial fibrillation   Serum Risk Factor Labs: Lab Results  Component Value Date   LDL 94 06/10/2024   CHOLESTEROL TOTAL 164 06/10/2024   Trig 78 06/10/2024   Hemoglobin A1c 7.2 (H) 06/10/2024   Hypercoagulable Panel:   not applicable  Antiplatelet/Anticoagulation medications prior to event:  Aspirin 325 mg daily , Plavix  75mg  daily - since TIA in 2006ish (?)  Tobacco Use: Medium Risk (06/15/2024)   Patient History    Smoking Tobacco Use: Former    Smokeless Tobacco Use: Never    Passive Exposure: Never    The  patient's discharge summary, testing and medications were reviewed and reconciled.   Impression   1. Stroke aborted by administration of thrombolytic agent (*)   2. Hyperlipidemia LDL goal <55   3. Primary hypertension   4. OSA on CPAP   5. Type 2 diabetes mellitus with hyperglycemia, with long-term current use of insulin  (*)   6. Paroxysmal A-fib (*)   7. Class 3 severe obesity due to excess calories without serious comorbidity with body mass index (BMI) of 45.0 to 49.9 in adult (*)      Etiology of Stroke:  likely Cardio embolic- new onset Atrial fibrillation not anticoagulated.  Residual symptoms: she reports word finding difficulty, word pronunciation trouble. Not seen during exam.  Stroke risk factors: diabetes mellitus, hyperlipidemia, hypertension, obesity, Obstructive Sleep Apnea (on CPAP, stopped it this year but plans on restarting it) , and history of TIA (2006ish)   Plan   No orders of the defined types were placed in this encounter.    -  Antiplatelet therapy: Aspirin 81 mg daily  -  Anticoagulation therapy: Eliquis 5 mg twice daily - No bleeding concerns  -  Lipid-lowering agents:  Rosuvastatin  10 mg at bedtime - only taking weekly, this has been a historic medication. LDL goal < 55. Could consider addition of Zetia  10mg  daily vs PCSK9 injection every 2 weeks such as Repatha vs Leqvio injections twice yearly - She was interested in Zetia  but would like to talk to PCP about this tomorrow.  -  Blood Pressure: slightly above goal in clinic, but Reports  home SBP 120-130s.   -  Rehab Needs Addressed: Established with home health- doing PT, speech felt like they were not needed. Follow up if symptoms worsen or fail to improve.  -PCP follow up: tomorrow   -  Encouraged patient to maintain a blood pressure log and to follow up with PCP for blood pressure management. -  Discussed stroke risk factor modification  Long-Term Secondary Stroke Prevention Goals &  Recommendations: -Goal blood pressure < 130/80 mmHg  - Goal LDL < 70: For people with diabetes and ASCVD, treatment with high-intensity statin therapy is recommended to target an LDL cholesterol reduction of >=50% from baseline and an LDL cholesterol goal of <55 mg/dL (<8.5 mmol/L). Addition of ezetimibe  or a PCSK9 inhibitor with proven benefit in this population is recommended if this goal is not achieved on maximum tolerated statin therapy. Standards of Care in Diabetes (2024) from American Diabetes Association (ADA) - Goal Hemoglobin A1c < 7%, and if a diagnosis of Diabetes, use of glucose lowering agents (GLP-1 receptor agonists, SGLT2 inhibitors, Thiazolidinedione) with proven cardiovascular benefit to reduce future risk of major adverse cardiovascular events (MACE) -Smoking Cessation (if applicable)  -Obstructive Sleep Apnea management (if applicable) -Low-Sodium Mediterranean diet  -Medication Compliance  -Regular Exercise (30 minutes per day, five days per week)  -Sleep health: 7-9 hours of sleep per night for adults 18-64, and 7-8 hours nightly for older adults   Risks, benefits, and alternatives of the medications and treatment plan prescribed today were discussed.  The patient expressed understanding of the instructions.  Past Medical History, Past Surgery History, Allergies, Social History, and Family History were reviewed and updated.    Medications Ordered Prior to Encounter[1]   Review of Systems is complete and negative except as noted.  Objective   BP 138/62 (BP Location: Right Upper Arm, Patient Position: Sitting)   Pulse 59   Wt 234 lb (106.1 kg)   BMI 45.70 kg/m   General:  Alert and oriented, no acute distress HENNT:  Neck is supple.  Eyes anicteric.  Moist oral mucosa. CV:  Regular rate and rhythm.  No obvious murmurs.  No carotid bruits. Chest:  Lungs are clear to auscultation. Extremities:  No peripheral edema Skin:  No rash  Neurologic: Mental Status:   Alert and oriented.  Language and speech appear grossly intact.  Memory, concentration and fund of knowledge are grossly intact.  Cranial nerve Exam:  Visual fields are full to confrontation. Pupils are equal, round and reactive.  Extraocular movements are intact without nystagmus.  Normal facial sensation.  No facial asymmetry.  Gross auditory is intact.  Tongue is midline and uvula and palate elevates symmetrically.  Motor Exam:  5/5 strength in the right upper and right lower extremities. 5/5 strength in the left upper and left lower extremities. Good muscle tone and bulk.  No pronator drift. Involuntary Movements: Absent    Coordination:  Intact to finger to nose. No dysmetria   Gait:  Deferred    Reflexes:  +1 in the upper and lower extremities.      Sensation:  Intact to light touch         *This note was dictated with voice recognition software. Inadvertently, similar sounding words can, sometimes, get transcribed incorrectly                  [1] Current Outpatient Medications on File Prior to Visit  Medication Sig Dispense Refill   acetaminophen  (TYLENOL  ARTHRITIS,MAPAP) 650 MG CR tablet Take one  tablet (650 mg dose) by mouth every 8 (eight) hours as needed for Pain.     apixaban (ELIQUIS) 5 mg tablet Take one tablet (5 mg dose) by mouth 2 (two) times daily. 60 tablet 0   aspirin 81 mg chewable tablet Chew one tablet (81 mg dose) by mouth daily. 30 tablet 0   b complex vitamins capsule Take one capsule by mouth daily.     cetirizine  (ZYRTEC ) 10 MG tablet Take one tablet (10 mg dose) by mouth at bedtime.     chlorthalidone  (HYGROTEN) 25 mg tablet Take one tablet (25 mg dose) by mouth every morning.     clobetasol  propionate (TEMOVATE ) 0.05% cream Apply one Application topically daily as needed. Under breast with groin     difluprednate (DUREZOL) 0.05% EMUL Place one drop into the right eye 4 (four) times daily.     diltiazem  (CARDIZEM  CD) 360 MG 24 hr capsule  Take one capsule (360 mg dose) by mouth daily. (Patient taking differently: Take one capsule (360 mg dose) by mouth every morning.) 90 capsule 3   DULoxetine  HCl (CYMBALTA ) 30 mg capsule Take one capsule (30 mg dose) by mouth every morning. (Patient taking differently: Take two capsules (60 mg dose) by mouth every morning.)     empagliflozin  (JARDIANCE ) 25 mg TABS tablet Take one tablet (25 mg dose) by mouth every morning.     Ferrous Sulfate Dried 200 (65 Fe) MG TABS Take 1 tablet by mouth daily.     finasteride (PROPECIA) 1 mg tablet Take one tablet (1 mg dose) by mouth daily.     fish oil-omega-3 fatty acids 1000 MG capsule Take two capsules (2 g dose) by mouth daily. (Patient taking differently: Take one capsule (1 g dose) by mouth daily.)     fluconazole  (DIFLUCAN ) 150 MG tablet Take one tablet (150 mg dose) by mouth as needed.     insulin  glargine (LANTUS ) 100 UNIT/ML injection Inject twenty five Units to thirty Units into the skin daily.     Insulin  Pen Needle 29G X MISC 50 Units by Does not apply route 2 (two) times daily. 200 each 2   latanoprost (XALATAN) 0.005% ophthalmic solution Place one drop into both eyes at bedtime.     lisinopril  (PRINIVIL ,ZESTRIL ) 20 mg tablet Take one tablet (20 mg dose) by mouth daily.     meclizine  HCl (MEDI-MECLIZINE ) 25 mg tablet Take one tablet (25 mg dose) by mouth 3 (three) times a day as needed for Nausea or Dizziness.     metformin  (GLUCOPHAGE ) 1000 MG tablet Take one tablet (1,000 mg dose) by mouth 2 (two) times daily with meals.     minoxidil (LONITEN) 2.5 mg tablet Take one tablet (2.5 mg dose) by mouth daily.     Misc. Devices MISC Reduce the cpap pressure to 14cm water.  Please fax order to [facility] and call to ensure the pressure will be reduced (this did not happen in Nov. 2018, although it looks like the order was faxed to [facility]). 1 each 0   Misc. Devices MISC Transfer of care to [facility].  Please provide service to her  current CPAP machine for OSA at 14 cm. water pressure.  Send directly to [facility] at [facility]. 1 each 0   Misc. Devices MISC New Resmed AirSense 11 CPAP machine with mask and supplies per patient preference for OSA.  CPAP therapy at 14 cm. water pressure.  Send to the current DME. 1 each 0   pantoprazole  sodium (PROTONIX ) 40  mg tablet Take one tablet (40 mg dose) by mouth 30 (thirty) minutes before breakfast and at bedtime. (Patient taking differently: Take one tablet (40 mg dose) by mouth 2 (two) times daily.) 180 tablet 3   PAPAYA ENZYME PO Take 1 tablet by mouth daily.     rosuvastatin  calcium  (CRESTOR ) 10 mg tablet Take one tablet (10 mg dose) by mouth once a week. Qhs on wednesday     tirzepatide  (MOUNJARO ) 15 mg/0.5 mL SOAJ injection Inject 0.5 mLs (15 mg dose) into the skin once a week. Mondays     triamcinolone  acetonide (KENALOG ) 0.1% cream Apply topically 2 (two) times daily. (Patient taking differently: Apply topically 2 (two) times a day as needed (Irritation).)     No current facility-administered medications on file prior to visit.

## 2024-06-15 NOTE — Patient Instructions (Signed)
 Visit Information  Thank you for taking time to visit with me today. Please don't hesitate to contact me if I can be of assistance to you before our next scheduled telephone appointment.  Our next appointment is by telephone on 06/23/24 in the morning, after 10am  Following is a copy of your care plan:   Goals Addressed             This Visit's Progress    [facility] Transitions of Care (TOC) Care Plan       Problems:  Recent Hospitalization for treatment of Stroke-like symptoms Risk for falls  Goal:  Over the next 30 days, the patient will not experience hospital readmission  Interventions:  Transitions of Care: Doctor Visits  - discussed the importance of doctor visits  Stroke: Reviewed Importance of taking all medications as prescribed Reviewed Importance of attending all scheduled provider appointments Advised patient to discuss Medications as noted in med rec  Assessed social determinant of health barriers Assessed for signs and symptoms of stroke Reviewed referrals to home health Assessed for management of bladder and/or bowel incontinence Assessed for cognitive impairment Assessed for fall status and safety in the home Educated on FAST signs for stroke - Be aware and make sure family is aware  Patient Self Care Activities:  Attend all scheduled provider appointments Call pharmacy for medication refills 3-7 days in advance of running out of medications Call provider office for new concerns or questions  Notify RN Care Manager of TOC call rescheduling needs Participate in Transition of Care Program/Attend TOC scheduled calls Take medications as prescribed   Discuss medications that we noted on med rec and patient's discharge summary with PCP at appt tomorrow 06/16/24 Look up information on medical alerts for safety Look up information on FDA website re: Freestyle Libre 3 sensor recall   Plan:  Telephone follow up appointment with care management team member scheduled  for:  06/23/24 in the morning  The patient has been provided with contact information for the care management team and has been advised to call with any health related questions or concerns.         Patient verbalizes understanding of instructions and care plan provided today and agrees to view in MyChart. Active MyChart status and patient understanding of how to access instructions and care plan via MyChart confirmed with patient.     Telephone follow up appointment with care management team member scheduled for: 06/23/24 The patient has been provided with contact information for the care management team and has been advised to call with any health related questions or concerns.   Please call the care guide team at [phone] if you need to cancel or reschedule your appointment.   Please call the Suicide and Crisis Lifeline: 988 call 1-800-273-TALK (toll free, 24 hour hotline) call 911 if you are experiencing a Mental Health or Behavioral Health Crisis or need someone to talk to.  Shona Prow RN, CCM [place]  [facility]BC[facility]-Population Health RN Care Manager [phone]

## 2024-06-15 NOTE — Transitions of Care (Post Inpatient/ED Visit) (Signed)
 06/15/2024  Name: Natalie Wright MRN: [id number] DOB: 06-08-1951  Today's TOC FU Call Status: Today's TOC FU Call Status:: Successful TOC FU Call Completed TOC FU Call Complete Date: 06/15/24  Patient's Name and Date of Birth confirmed. DOB, Name  Transition Care Management Follow-up Telephone Call How have you been since you were released from the hospital?: Better Any questions or concerns?: No  Items Reviewed: Did you receive and understand the discharge instructions provided?: Yes Medications obtained,verified, and reconciled?: Yes (Medications Reviewed) Any new allergies since your discharge?: No Dietary orders reviewed?: Yes Type of Diet Ordered:: Carb Modified Do you have support at home?: Yes People in Home [RPT]: alone Name of Support/Comfort Primary Source: Lives alone but sister in law is staying with her until she is cleared by MD - Has 2 adult daughter  Medications Reviewed Today: Medications Reviewed Today     Reviewed by Lauro Shona LABOR, RN (Registered Nurse) on 06/15/24 at 1209  Med List Status: <None>   Medication Order Taking? Sig Documenting Provider Last Dose Status Informant  acetaminophen  (TYLENOL ) 650 MG CR tablet [id number] Yes Take 650 mg by mouth every 8 (eight) hours as needed for pain. [provider]  Active   SONJIA JESSE SCHLOSSMAN MEDICATION [id number] Yes Medication Name: custom fit knee high compression stocking with 15-20 mmHg pressure. Open toe preferred. Alvan Dorothyann BIRCH, MD  Active   apixaban (ELIQUIS) 5 MG TABS tablet [id number] Yes Take 5 mg by mouth 2 (two) times daily. [provider]  Active   aspirin 325 MG tablet [id number]  Take 325 mg by mouth daily.  Patient not taking: Reported on 06/15/2024   [provider]  Consider Medication Status and Discontinue (Change in therapy)   aspirin 81 MG chewable tablet [id number] Yes Chew 81 mg by mouth daily. [provider]  Active   AZELAIC ACID EX  [id number] Yes Apply topically.  Patient taking differently: Apply topically. This is a compound medication with Niacinamide and Azelaic and tranexamic - not on 06/11/24 discharge - patient will discuss with PCP 06/16/24 hospital f/u   [provider]  Active   b complex vitamins capsule [id number] Yes Take 1 capsule by mouth daily. [provider]  Active   cetirizine  (ZYRTEC ) 10 MG tablet [id number] Yes Take 10 mg by mouth daily. [provider]  Active   chlorthalidone  (HYGROTON ) 25 MG tablet [id number] Yes Take 1 tablet (25 mg total) by mouth daily. Alvan Dorothyann BIRCH, MD  Active   clobetasol  cream (TEMOVATE ) 0.05 % [id number] Yes Apply 1 Application topically daily as needed (under breast). [provider]  Active   clopidogrel  (PLAVIX ) 75 MG tablet [id number]  TAKE 1 TABLET DAILY  Patient not taking: Reported on 06/15/2024   Alvan Dorothyann BIRCH, MD  Consider Medication Status and Discontinue (Discontinued by provider)   Continuous Glucose Sensor (FREESTYLE LIBRE 3 PLUS SENSOR) MISC [id number] Yes Change sensor every 15 days. Alvan Dorothyann BIRCH, MD  Active   Difluprednate (DUREZOL) 0.05 % EMUL [id number] Yes Apply 1 drop to eye in the morning, at noon, in the evening, and at bedtime.  Patient taking differently: Apply 1 drop to eye in the morning, at noon, in the evening, and at bedtime. Right eye   [provider]  Active   diltiazem  (CARDIZEM  CD) 360 MG 24 hr capsule [id number] Yes Take 1 capsule (360 mg total) by mouth daily. Alvan Dorothyann BIRCH, MD  Active   DULoxetine  (CYMBALTA ) 60 MG capsule [id number] Yes Take  1 capsule (60 mg total) by mouth daily.  Patient taking differently: Take 60 mg by mouth daily. Per 06/11/14 discharge from [facility]  - states 30mg  - patient states she takes 60mg  and will discuss with PCP 06/16/24 at hospital f/u   Gottwalt, Redell A, DO  Active   Ferrous Sulfate Dried 200 (65 Fe) MG TABS [id number] Yes Take 1 tablet by  mouth daily. [provider]  Active   finasteride (PROPECIA) 1 MG tablet [id number] Yes Take 1 mg by mouth daily. [provider]  Active   fluconazole  (DIFLUCAN ) 150 MG tablet [id number] Yes Take 150 mg by mouth daily as needed (yeast). [provider]  Active   insulin  glargine (LANTUS ) 100 UNIT/ML injection [id number] Yes INJECT 35 UNITS UNDER THE SKIN DAILY  Patient taking differently: INJECT 25 -30 UNITS UNDER THE SKIN DAILY   Alvan Dorothyann BIRCH, MD  Active   Insulin  Pen Needle (NOVOFINE) 30G X 8 MM MISC [id number] Yes For use each time injection for insulin  Alvan Dorothyann BIRCH, MD  Active   JARDIANCE  25 MG TABS tablet [id number] Yes TAKE 1 TABLET DAILY Metheney, Catherine D, MD  Active   latanoprost (XALATAN) 0.005 % ophthalmic solution [id number] Yes 1 drop. [provider]  Active            Med Note JEANNA, TONYA L   Tue Sep 08, 2019  1:27 PM)    lisinopril  (ZESTRIL ) 20 MG tablet [id number] Yes Take 1 tablet (20 mg total) by mouth daily. Alvan Dorothyann BIRCH, MD  Active   meclizine  (ANTIVERT ) 25 MG tablet [id number] Yes Take 1 tablet (25 mg total) by mouth 3 (three) times daily as needed for dizziness. Alvan Dorothyann BIRCH, MD  Active   metFORMIN  (GLUCOPHAGE ) 1000 MG tablet [id number] Yes Take 1 tablet (1,000 mg total) by mouth 2 (two) times daily with a meal. Alvan Dorothyann BIRCH, MD  Active   methocarbamol  (ROBAXIN ) 500 MG tablet [id number]  Take 1 tablet (500 mg total) by mouth 2 (two) times daily.  Patient not taking: Reported on 06/15/2024   Maranda Jamee Jacob, MD  Consider Medication Status and Discontinue (Completed Course)   minoxidil (LONITEN) 2.5 MG tablet [id number] Yes Take 2.5 mg by mouth daily. [provider]  Active   Misc. Devices MISC [id number]  Transfer of care to [facility].  Please provide service to her current CPAP machine for OSA at 14 cm. water pressure.  Send directly to [place] at [facility].  Patient not taking: Reported on  06/15/2024   [provider]  Active   NIACINAMIDE EX [id number] Yes Apply topically.  Patient taking differently: Apply topically. This is a compound medication with Niacinamide and Azelaic and tranexamic - not on 06/11/24 discharge - patient will discuss with PCP 06/16/24 hospital f/u   [provider]  Active   nystatin cream (MYCOSTATIN) [id number]  Apply 1 Application topically 2 (two) times daily.  Patient taking differently: Apply 1 Application topically as needed.   [provider]  Consider Medication Status and Discontinue (Patient Preference)   nystatin powder [id number] Yes Apply 1 Application topically 3 (three) times daily.  Patient taking differently: Apply 1 Application topically as needed. Not on 06/11/24 discharge list - patient will discuss with PCP at 06/16/24 office visit   [provider]  Active   Omega-3 Fatty Acids (FISH OIL) 1200 MG CAPS [id number] Yes Take 1 capsule by mouth daily.  Patient taking differently: Take 1 capsule by mouth daily. Patient reports taking  1 daily - hospital d/c from [facility] 06/11/24 states 2 capsules - patient will discuss with PCP at 06/16/24 hospital f/u   [provider]  Active   pantoprazole  (PROTONIX ) 40 MG tablet [id number] Yes TAKE 1 TABLET TWICE A DAY Alvan Dorothyann BIRCH, MD  Active   PAPAYA ENZYME PO [id number] Yes Take 1 tablet by mouth daily. [provider]  Active   rosuvastatin  (CRESTOR ) 10 MG tablet [id number] Yes TAKE 1 TABLET ONCE A WEEK AT BEDTIME Alvan Dorothyann BIRCH, MD  Active   tirzepatide  (MOUNJARO ) 15 MG/0.5ML Pen [id number] Yes Inject 15 mg into the skin once a week. Alvan Dorothyann BIRCH, MD  Active   Tranexamic Acid POWD [id number] Yes by Does not apply route.  Patient taking differently: by Does not apply route. This is a compound medication with Niacinamide and Azelaic and tranexamic - not on 06/11/24 discharge - patient will discuss with PCP 06/16/24 hospital f/u    [provider]  Active   triamcinolone  cream (KENALOG ) 0.5 % [id number] Yes Apply 1 Application topically 3 (three) times daily.  Patient taking differently: Apply 1 Application topically 2 (two) times daily.   [provider]  Active             Home Care and Equipment/Supplies: Were Home Health Services Ordered?: Yes Name of Home Health Agency:: [facility] Has Agency set up a time to come to your home?: Yes First Home Health Visit Date: 06/12/24 Any new equipment or medical supplies ordered?: No  Functional Questionnaire: Do you need assistance with bathing/showering or dressing?: No Do you need assistance with meal preparation?: No Do you need assistance with eating?: No Do you have difficulty maintaining continence: Yes (occasional with bowel) Do you need assistance with getting out of bed/getting out of a chair/moving?: No Do you have difficulty managing or taking your medications?: No  Follow up appointments reviewed: PCP Follow-up appointment confirmed?: Yes Date of PCP follow-up appointment?: 06/16/24 Follow-up Provider: PCP, [facility] Follow-up appointment confirmed?: NA Do you need transportation to your follow-up appointment?: No Do you understand care options if your condition(s) worsen?: Yes-patient verbalized understanding  SDOH Interventions Today    Flowsheet Row Most Recent Value  SDOH Interventions   Food Insecurity Interventions Intervention Not Indicated  Housing Interventions Intervention Not Indicated  Transportation Interventions Intervention Not Indicated  Utilities Interventions Intervention Not Indicated    Goals Addressed             This Visit's Progress    VBCI Transitions of Care (TOC) Care Plan       Problems:  Recent Hospitalization for treatment of Stroke-like symptoms Risk for falls  Goal:  Over the next 30 days, the patient will not experience hospital readmission  Interventions:   Transitions of Care: Doctor Visits  - discussed the importance of doctor visits  Stroke: Reviewed Importance of taking all medications as prescribed Reviewed Importance of attending all scheduled provider appointments Advised patient to discuss Medications as noted in med rec  Assessed social determinant of health barriers Assessed for signs and symptoms of stroke Reviewed referrals to home health Assessed for management of bladder and/or bowel incontinence Assessed for cognitive impairment Assessed for fall status and safety in the home Educated on FAST signs for stroke - Be aware and make sure family is aware  Patient Self Care Activities:  Attend all scheduled provider appointments Call pharmacy for medication refills 3-7 days in advance of running out of medications Call provider office  for new concerns or questions  Notify RN Care Manager of TOC call rescheduling needs Participate in Transition of Care Program/Attend TOC scheduled calls Take medications as prescribed   Discuss medications that we noted on med rec and patient's discharge summary with PCP at appt tomorrow 06/16/24 Look up information on medical alerts for safety Look up information on FDA website re: Freestyle Libre 3 sensor recall   Plan:  Telephone follow up appointment with care management team member scheduled for:  06/23/24 in the morning  The patient has been provided with contact information for the care management team and has been advised to call with any health related questions or concerns.         Shona Prow RN, CCM   VBCI-Population Health RN Care Manager [phone]

## 2024-06-15 NOTE — Transitions of Care (Post Inpatient/ED Visit) (Deleted)
 06/15/2024  Name: Natalie Wright MRN: [id number] DOB: [birth year]  Today's TOC FU Call Status: Today's TOC FU Call Status:: Successful TOC FU Call Completed TOC FU Call Complete Date: 06/15/24  Patient's Name and Date of Birth confirmed. DOB, Name  Transition Care Management Follow-up Telephone Call How have you been since you were released from the hospital?: Better Any questions or concerns?: No  Items Reviewed: Did you receive and understand the discharge instructions provided?: Yes Medications obtained,verified, and reconciled?: Yes (Medications Reviewed) Any new allergies since your discharge?: No Dietary orders reviewed?: Yes Type of Diet Ordered:: Carb Modified Do you have support at home?: Yes People in Home [RPT]: alone Name of Support/Comfort Primary Source: Lives alone but sister in law is staying with her until she is cleared by MD - Has 2 adult daughter  Medications Reviewed Today: Medications Reviewed Today     Reviewed by Lauro Shona LABOR, RN (Registered Nurse) on 06/15/24 at 1203  Med List Status: <None>   Medication Order Taking? Sig Documenting Provider Last Dose Status Informant  acetaminophen  (TYLENOL ) 650 MG CR tablet [id number] Yes Take 650 mg by mouth every 8 (eight) hours as needed for pain. [provider]  Active   SONJIA JESSE SCHLOSSMAN MEDICATION [id number] Yes Medication Name: custom fit knee high compression stocking with 15-20 mmHg pressure. Open toe preferred. Alvan Dorothyann BIRCH, MD  Active   apixaban (ELIQUIS) 5 MG TABS tablet [id number] Yes Take 5 mg by mouth 2 (two) times daily. [provider]  Active   aspirin 325 MG tablet [id number]  Take 325 mg by mouth daily.  Patient not taking: Reported on 06/15/2024   [provider]  Consider Medication Status and Discontinue (Change in therapy)   aspirin 81 MG chewable tablet [id number] Yes Chew 81 mg by mouth daily. [provider]  Active   AZELAIC ACID EX  [id number] Yes Apply topically.  Patient taking differently: Apply topically. This is a compound medication with Niacinamide and Azelaic and tranexamic - not on 06/14/24 discharge - patient will discuss with PCP 06/16/24 hospital f/u   [provider]  Active   b complex vitamins capsule [id number] Yes Take 1 capsule by mouth daily. [provider]  Active   cetirizine  (ZYRTEC ) 10 MG tablet [id number] Yes Take 10 mg by mouth daily. [provider]  Active   chlorthalidone  (HYGROTON ) 25 MG tablet [id number] Yes Take 1 tablet (25 mg total) by mouth daily. Alvan Dorothyann BIRCH, MD  Active   clobetasol  cream (TEMOVATE ) 0.05 % [id number] Yes Apply 1 Application topically daily as needed (under breast). [provider]  Active   clopidogrel  (PLAVIX ) 75 MG tablet [id number]  TAKE 1 TABLET DAILY  Patient not taking: Reported on 06/15/2024   Alvan Dorothyann BIRCH, MD  Consider Medication Status and Discontinue (Discontinued by provider)   Continuous Glucose Sensor (FREESTYLE LIBRE 3 PLUS SENSOR) MISC [id number] Yes Change sensor every 15 days. Alvan Dorothyann BIRCH, MD  Active   Difluprednate (DUREZOL) 0.05 % EMUL [id number] Yes Apply 1 drop to eye in the morning, at noon, in the evening, and at bedtime.  Patient taking differently: Apply 1 drop to eye in the morning, at noon, in the evening, and at bedtime. Right eye   [provider]  Active   diltiazem  (CARDIZEM  CD) 360 MG 24 hr capsule [id number] Yes Take 1 capsule (360 mg total) by mouth daily. Alvan Dorothyann BIRCH, MD  Active   DULoxetine  (CYMBALTA ) 60 MG capsule [id number] Yes Take  1 capsule (60 mg total) by mouth daily.  Patient taking differently: Take 60 mg by mouth daily. Per 06/11/14 discharge from [facility]  - states 30mg  - patient states she takes 60mg  and will discuss with PCP 06/16/24 at hospital f/u   Gottwalt, Redell A, DO  Active   Ferrous Sulfate Dried 200 (65 Fe) MG TABS [id number] Yes Take 1 tablet by  mouth daily. [provider]  Active   finasteride (PROPECIA) 1 MG tablet [id number] Yes Take 1 mg by mouth daily. [provider]  Active   fluconazole  (DIFLUCAN ) 150 MG tablet [id number] Yes Take 150 mg by mouth daily as needed (yeast). [provider]  Active   insulin  glargine (LANTUS ) 100 UNIT/ML injection [id number] Yes INJECT 35 UNITS UNDER THE SKIN DAILY  Patient taking differently: INJECT 25 -30 UNITS UNDER THE SKIN DAILY   Alvan Dorothyann BIRCH, MD  Active   Insulin  Pen Needle (NOVOFINE) 30G X 8 MM MISC [id number] Yes For use each time injection for insulin  Alvan Dorothyann BIRCH, MD  Active   JARDIANCE  25 MG TABS tablet [id number] Yes TAKE 1 TABLET DAILY Metheney, Catherine D, MD  Active   latanoprost (XALATAN) 0.005 % ophthalmic solution [id number] Yes 1 drop. [provider]  Active            Med Note JEANNA, BASCOM CROME   Tue Sep 08, 2019  1:27 PM)    lisinopril  (ZESTRIL ) 20 MG tablet [id number] Yes Take 1 tablet (20 mg total) by mouth daily. Alvan Dorothyann BIRCH, MD  Active   meclizine  (ANTIVERT ) 25 MG tablet [id number] Yes Take 1 tablet (25 mg total) by mouth 3 (three) times daily as needed for dizziness. Alvan Dorothyann BIRCH, MD  Active   metFORMIN  (GLUCOPHAGE ) 1000 MG tablet [id number] Yes Take 1 tablet (1,000 mg total) by mouth 2 (two) times daily with a meal. Alvan Dorothyann BIRCH, MD  Active   methocarbamol  (ROBAXIN ) 500 MG tablet [id number]  Take 1 tablet (500 mg total) by mouth 2 (two) times daily.  Patient not taking: Reported on 06/15/2024   Maranda Jamee Jacob, MD  Consider Medication Status and Discontinue (Completed Course)   minoxidil (LONITEN) 2.5 MG tablet [id number] Yes Take 2.5 mg by mouth daily. [provider]  Active   Misc. Devices MISC [id number]  Transfer of care to [facility].  Please provide service to her current CPAP machine for OSA at 14 cm. water pressure.  Send directly to [place] at [facility].  Patient not taking: Reported on  06/15/2024   [provider]  Active   NIACINAMIDE EX [id number] Yes Apply topically.  Patient taking differently: Apply topically. This is a compound medication with Niacinamide and Azelaic and tranexamic - not on 06/14/24 discharge - patient will discuss with PCP 06/16/24 hospital f/u   [provider]  Active   nystatin cream (MYCOSTATIN) [id number]  Apply 1 Application topically 2 (two) times daily.  Patient taking differently: Apply 1 Application topically as needed.   [provider]  Consider Medication Status and Discontinue (Patient Preference)   nystatin powder [id number] Yes Apply 1 Application topically 3 (three) times daily.  Patient taking differently: Apply 1 Application topically as needed. Not on 06/14/24 discharge list - patient will discuss with PCP at 06/16/24 office visit   [provider]  Active   Omega-3 Fatty Acids (FISH OIL) 1200 MG CAPS [id number] Yes Take 1 capsule by mouth daily.  Patient taking differently: Take 1 capsule by mouth daily. Patient reports taking  1 daily - hospital d/c from [facility] 06/14/24 states 2 capsules - patient will discuss with PCP at 06/16/24 hospital f/u   [provider]  Active   pantoprazole  (PROTONIX ) 40 MG tablet [id number] Yes TAKE 1 TABLET TWICE A DAY Alvan Dorothyann BIRCH, MD  Active   PAPAYA ENZYME PO [id number] Yes Take 1 tablet by mouth daily. [provider]  Active   rosuvastatin  (CRESTOR ) 10 MG tablet [id number] Yes TAKE 1 TABLET ONCE A WEEK AT BEDTIME Alvan Dorothyann BIRCH, MD  Active   tirzepatide  (MOUNJARO ) 15 MG/0.5ML Pen [id number] Yes Inject 15 mg into the skin once a week. Alvan Dorothyann BIRCH, MD  Active   Tranexamic Acid POWD [id number] Yes by Does not apply route.  Patient taking differently: by Does not apply route. This is a compound medication with Niacinamide and Azelaic and tranexamic - not on 06/14/24 discharge - patient will discuss with PCP 06/16/24 hospital f/u    [provider]  Active   triamcinolone  cream (KENALOG ) 0.5 % [id number] Yes Apply 1 Application topically 3 (three) times daily.  Patient taking differently: Apply 1 Application topically 2 (two) times daily.   [provider]  Active             Home Care and Equipment/Supplies: Were Home Health Services Ordered?: Yes Name of Home Health Agency:: [facility] Has Agency set up a time to come to your home?: Yes First Home Health Visit Date: 06/12/24 Any new equipment or medical supplies ordered?: No  Functional Questionnaire: Do you need assistance with bathing/showering or dressing?: No Do you need assistance with meal preparation?: No Do you need assistance with eating?: No Do you have difficulty maintaining continence: Yes (occasional with bowel) Do you need assistance with getting out of bed/getting out of a chair/moving?: No Do you have difficulty managing or taking your medications?: No  Follow up appointments reviewed: PCP Follow-up appointment confirmed?: Yes Date of PCP follow-up appointment?: 06/16/24 Follow-up Provider: PCP, [facility] Follow-up appointment confirmed?: NA Do you need transportation to your follow-up appointment?: No Do you understand care options if your condition(s) worsen?: Yes-patient verbalized understanding  SDOH Interventions Today    Flowsheet Row Most Recent Value  SDOH Interventions   Food Insecurity Interventions Intervention Not Indicated  Housing Interventions Intervention Not Indicated  Transportation Interventions Intervention Not Indicated  Utilities Interventions Intervention Not Indicated    Goals Addressed             This Visit's Progress    VBCI Transitions of Care (TOC) Care Plan       Problems:  Recent Hospitalization for treatment of Stroke-like symptoms Risk for falls  Goal:  Over the next 30 days, the patient will not experience hospital readmission  Interventions:   Transitions of Care: Doctor Visits  - discussed the importance of doctor visits  Stroke: Reviewed Importance of taking all medications as prescribed Reviewed Importance of attending all scheduled provider appointments Advised patient to discuss Medications as noted in med rec  Assessed social determinant of health barriers Assessed for signs and symptoms of stroke Reviewed referrals to home health Assessed for management of bladder and/or bowel incontinence Assessed for cognitive impairment Assessed for fall status and safety in the home Educated on FAST signs for stroke - Be aware and make sure family is aware  Patient Self Care Activities:  Attend all scheduled provider appointments Call pharmacy for medication refills 3-7 days in advance of running out of medications Call provider office  for new concerns or questions  Notify RN Care Manager of TOC call rescheduling needs Participate in Transition of Care Program/Attend TOC scheduled calls Take medications as prescribed   Discuss medications that we noted on med rec and patient's discharge summary with PCP at appt tomorrow 06/16/24 Look up information on medical alerts for safety Look up information on FDA website re: Freestyle Libre 3 sensor recall   Plan:  Telephone follow up appointment with care management team member scheduled for:  06/23/24 in the morning  The patient has been provided with contact information for the care management team and has been advised to call with any health related questions or concerns.         Shona Prow RN, CCM [place]  VBCI-Population Health RN Care Manager [phone]

## 2024-06-16 ENCOUNTER — Ambulatory Visit: Admitting: Family Medicine

## 2024-06-16 ENCOUNTER — Encounter: Payer: Self-pay | Admitting: Family Medicine

## 2024-06-16 VITALS — BP 113/38 | HR 57 | Ht 60.0 in | Wt 234.0 lb

## 2024-06-16 DIAGNOSIS — M48061 Spinal stenosis, lumbar region without neurogenic claudication: Secondary | ICD-10-CM

## 2024-06-16 DIAGNOSIS — Z7984 Long term (current) use of oral hypoglycemic drugs: Secondary | ICD-10-CM | POA: Diagnosis not present

## 2024-06-16 DIAGNOSIS — I10 Essential (primary) hypertension: Secondary | ICD-10-CM

## 2024-06-16 DIAGNOSIS — E785 Hyperlipidemia, unspecified: Secondary | ICD-10-CM | POA: Diagnosis not present

## 2024-06-16 DIAGNOSIS — Z794 Long term (current) use of insulin: Secondary | ICD-10-CM | POA: Diagnosis not present

## 2024-06-16 DIAGNOSIS — E119 Type 2 diabetes mellitus without complications: Secondary | ICD-10-CM | POA: Diagnosis not present

## 2024-06-16 MED ORDER — EZETIMIBE 10 MG PO TABS: 10.0000 mg | ORAL_TABLET | Freq: Every day | ORAL | 3 refills | Status: AC

## 2024-06-16 MED ORDER — LIDOCAINE 5 % EX PTCH: 1.0000 | MEDICATED_PATCH | CUTANEOUS | 6 refills | Status: AC

## 2024-06-16 NOTE — Assessment & Plan Note (Signed)
 DBP still low. Ok to cut the losartan in half and take half a tab daily.

## 2024-06-16 NOTE — Progress Notes (Signed)
 Established Patient Office Visit  Patient ID: Natalie Wright, female    DOB: [birth year]  Age: 73 y.o. MRN: [id number] PCP: Alvan Dorothyann BIRCH, MD  No chief complaint on file.   Subjective:     HPI  Discussed the use of AI scribe software for clinical note transcription with the patient, who gave verbal consent to proceed.  History of Present Illness Natalie Wright is a 73 year old female with a history of stroke who presents with difficulties in verbalization and orthostatic hypotension.  Speech impairment post-stroke - Difficulties with verbalization, including word-finding and pronunciation, began after a recent stroke. - Initial stroke symptoms included a strange feeling and generalized weakness while cooking, without side-specific weakness. - No focal neurological deficits reported. - Currently not participating in speech therapy; advised to wait and monitor for improvement. - Undergoing home physical therapy. - Currently taking Eliquis and baby aspirin.  Orthostatic hypotension and dehydration - History of orthostatic hypotension, currently monitored with a new blood pressure monitor. - Blood pressure readings have been stable. - Significant thirst and dehydration present. - Currently taking lisinopril  20 mg, recently increased from a lower dose. - Also taking chlorthalidone , a diuretic, which may contribute to dehydration.  Chronic back and hip pain - Pain radiates from left hip across the back to right hip. - Pain worsens as the effect of spinal injections diminishes. - Lidocaine  5% patches provided in hospital were more effective for pain relief than tramadol . - Seeking prescription for lidocaine  patches for ongoing pain management.  Diabetes mellitus - Hemoglobin A1c improved to 7.2, indicating better glycemic control.     ROS    Objective:     BP (!) 113/38   Pulse (!) 57   Ht 5' (1.524 m)   Wt 234 lb (106.1 kg)   SpO2 100%   BMI 45.70 kg/m     Physical Exam   No results found for any visits on 06/16/24.    The ASCVD Risk score (Arnett DK, et al., 2019) failed to calculate for the following reasons:   Risk score cannot be calculated because patient has a medical history suggesting prior/existing ASCVD   * - Cholesterol units were assumed    Assessment & Plan:   Problem List Items Addressed This Visit       Cardiovascular and Mediastinum   Essential hypertension, benign   DBP still low. Ok to cut the losartan in half and take half a tab daily.        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 No weakness. On Eliquis and aspirin. Speech therapy deferred pending physical therapy. Zetia  recommended for cholesterol management. - Continue Eliquis and aspirin. - Monitor speech improvement; consider speech therapy if no improvement after physical therapy. - Started Zetia  for cholesterol management.  Hypertension with orthostatic hypotension Hypertension with orthostatic hypotension. Blood pressure generally well-controlled. Lisinopril  dose reduction considered. Hydration emphasized. - Reduced lisinopril  dose from 20 mg to 10 mg daily. - Monitor blood pressure and symptoms; adjust medication as needed. - Ensure adequate hydration.  Type 2 diabetes mellitus Recent improvement in glycemic control. A1c reduced to 7.2. - Continue current diabetes management plan.  Hyperlipidemia  Management adjusted post-stroke. Statins not tolerated; Zetia  recommended to achieve LDL target of less than 70. - Started Zetia  for hyperlipidemia management.  Lumbar radiculopathy with chronic low back pain Chronic low back pain with  radiculopathy. Previous tramadol  use discontinued. Lidocaine  patches effective. - Prescribed lidocaine  5% patches for pain management. - Sent prescription to pharmacy.    Return in about 3 months (around 09/14/2024) for Diabetes follow-up.    [name], MD [facility] at [facility]

## 2024-06-18 ENCOUNTER — Inpatient Hospital Stay: Admitting: Family Medicine

## 2024-06-23 ENCOUNTER — Telehealth: Payer: Self-pay

## 2024-06-23 NOTE — Patient Instructions (Signed)
 Visit Information  Thank you for taking time to visit with me today. Please don't hesitate to contact me if I can be of assistance to you before our next scheduled telephone appointment.  Our next appointment is by telephone on 06/30/24 in the afternoon  Following is a copy of your care plan:   Goals Addressed             This Visit's Progress    VBCI Transitions of Care (TOC) Care Plan       Problems:  Recent Hospitalization for treatment of Stroke-like symptoms - Stroke aborted by administration of thrombolytic agent  Risk for falls  Goal:  Over the next 30 days, the patient will not experience hospital readmission  Interventions:  Transitions of Care: Doctor Visits  - discussed the importance of doctor visits Update 06/23/24: patient confirmed seeing FNP at stroke center 06/15/24 with no changes and saw PCP 06/16/24 as planned where [facility] RN noted BP 113/38 and her Lisinopril  was cut in half- patient was also started new on Ezetimibe  10mg  qd  - Today patient reports she was having lightheadedness, sleepiness and felt it was related to the Eliquis - patient reports lowest BP  since her 06/16/24 PCP appt 104/68  &  highest 139/65  Freestyle sugar 105; patient voiced concern about Eliquis causing back pain - and states diagnostic imagining needs note from PCP about holding Eliquis before they will administer injection - pain now 6-7/10 - was getting every 2 months and it's been 3 months - message sent via secure chat to PCP who was with a patient - advised that patient will send a MyChart message re: back pain and BP to obtain parameters - PCP responded saying that sounds great.  patient reports no unexplained weight loss but is on Mounjaro  for weight loss - current weight 234lbs Ht 5' goal 200lbs for bliateral knee replacement   Stroke: Reviewed Importance of taking all medications as prescribed Reviewed Importance of attending all scheduled provider appointments Advised patient to  discuss Medications as noted in med rec  Assessed social determinant of health barriers Assessed for signs and symptoms of stroke Reviewed referrals to home health Assessed for management of bladder and/or bowel incontinence Assessed for cognitive impairment Assessed for fall status and safety in the home Educated on FAST signs for stroke - Be aware and make sure family is aware  Patient Self Care Activities:  Attend all scheduled provider appointments Call pharmacy for medication refills 3-7 days in advance of running out of medications Call provider office for new concerns or questions  Notify RN Care Manager of TOC call rescheduling needs Participate in Transition of Care Program/Attend TOC scheduled calls Take medications as prescribed   Discuss medications that we noted on med rec and patient's discharge summary with PCP at appt tomorrow 06/16/24 Look up information on medical alerts for safety Look up information on FDA website re: Freestyle Libre 3 sensor recall   Plan:  Telephone follow up appointment with care management team member scheduled for:  06/30/24 in the afternoon  The patient has been provided with contact information for the care management team and has been advised to call with any health related questions or concerns.         Patient verbalizes understanding of instructions and care plan provided today and agrees to view in MyChart. Active MyChart status and patient understanding of how to access instructions and care plan via MyChart confirmed with patient.     Telephone  follow up appointment with care management team member scheduled for: 06/30/24 The patient has been provided with contact information for the care management team and has been advised to call with any health related questions or concerns.   Please call the care guide team at [phone] if you need to cancel or reschedule your appointment.   Please call the Suicide and Crisis Lifeline:  988 call 1-800-273-TALK (toll free, 24 hour hotline) call 911 if you are experiencing a Mental Health or Behavioral Health Crisis or need someone to talk to.  Shona Prow RN, CCM [place]  VBCI-Population Health RN Care Manager [phone]

## 2024-06-23 NOTE — Transitions of Care (Post Inpatient/ED Visit) (Signed)
 " Transition of Care week 2  Visit Note  06/23/2024  Name: Natalie Wright MRN: [id number]          DOB: 08-Apr-1951  Situation: Patient enrolled in [facility] 30-day program. Visit completed with patient by telephone.   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   Initial Transition Care Management Follow-up Telephone Call Discharge Date and Diagnosis: 06/11/24, Stroke-like symptoms   Past Medical History:  Diagnosis Date   Allergy    to cats and dogs   Anemia    Arthritis    Cataract    Diabetes mellitus    GERD (gastroesophageal reflux disease)    Glaucoma    Hiatal hernia    and delayed gastric emptying/ sees [facility] for recurrent diarrhea   History of cardiovascular disorder 12/10/2008   Qualifier: Diagnosis of  By: Alvan MD, Catherine     Hypertension    OSA (obstructive sleep apnea)    CPAP   Pedal edema    Sleep apnea    Spastic bladder    urology in [place]   TIA (transient ischemic attack)    hx of- sees Dr Robin (Neurology)   Uveitis     Assessment: Patient Reported Symptoms: Cognitive Cognitive Status: No symptoms reported, Normal speech and language skills, Alert and oriented to person, place, and time      Neurological      HEENT        Cardiovascular      Respiratory      Endocrine Endocrine Symptoms Reported: No symptoms reported Is patient diabetic?: Yes Is patient checking blood sugars at home?: Yes List most recent blood sugar readings, include date and time of day: Patient reports sugar 105 and states she'd forgotten to look at info on Freestyle but she will Endocrine Self-Management Outcome: 3 (uncertain)  Gastrointestinal Gastrointestinal Symptoms Reported: No symptoms reported      Genitourinary Genitourinary Symptoms Reported: No symptoms reported    Integumentary Integumentary Symptoms Reported: No symptoms  reported    Musculoskeletal Musculoskelatal Symptoms Reviewed: Other Other Musculoskeletal Symptoms: Patient trying to lose weight for knee replacement needs to be at 200lbs Musculoskeletal Self-Management Outcome: 3 (uncertain)      Psychosocial           There were no vitals filed for this visit. Pain Scale: 0-10 Pain Score: 6  Pain Type: Chronic pain Pain Location: Back Pain Orientation: Left, Right, Lower (patient states pain radiates down the legs) Pain Descriptors / Indicators: Sore Pain Onset: On-going Pain Intervention(s): Other (Comment) (patient reports she has been getting injections in bed for 3 years every 2 months and has not had injection for 3 months)  Medications Reviewed Today     Reviewed by Lauro Shona LABOR, RN (Registered Nurse) on 06/23/24 at 1137  Med List Status: <None>   Medication Order Taking? Sig Documenting Provider Last Dose Status Informant  acetaminophen  (TYLENOL ) 650 MG CR tablet [id number] Yes Take 650 mg by mouth every 8 (eight) hours as needed for pain. [provider]  Active   SONJIA JESSE SCHLOSSMAN MEDICATION [id number] Yes Medication Name: custom fit knee high compression stocking with 15-20 mmHg pressure. Open toe preferred. Alvan Dorothyann BIRCH, MD  Active   apixaban (ELIQUIS) 5 MG TABS tablet [id number] Yes Take 5 mg by mouth 2 (two) times daily. [provider]  Active   aspirin 81 MG chewable tablet [id number] Yes Chew  81 mg by mouth daily. [provider]  Active   AZELAIC ACID EX [id number] Yes Apply topically.  Patient taking differently: Apply topically. This is a compound medication with Niacinamide and Azelaic and tranexamic - not on 06/11/24 discharge - patient will discuss with PCP 06/16/24 hospital f/u   [provider]  Active   b complex vitamins capsule [id number] Yes Take 1 capsule by mouth daily. [provider]  Active   cetirizine  (ZYRTEC ) 10 MG tablet [id number] Yes Take 10 mg by  mouth daily. [provider]  Active   chlorthalidone  (HYGROTON ) 25 MG tablet [id number] Yes Take 1 tablet (25 mg total) by mouth daily. Alvan Dorothyann BIRCH, MD  Active   clobetasol  cream (TEMOVATE ) 0.05 % [id number] Yes Apply 1 Application topically daily as needed (under breast). [provider]  Active   Continuous Glucose Sensor (FREESTYLE LIBRE 3 PLUS SENSOR) MISC [id number] Yes Change sensor every 15 days. Alvan Dorothyann BIRCH, MD  Active   Difluprednate (DUREZOL) 0.05 % EMUL [id number] Yes Apply 1 drop to eye in the morning, at noon, in the evening, and at bedtime.  Patient taking differently: Apply 1 drop to eye in the morning, at noon, in the evening, and at bedtime. Right eye   [provider]  Active   diltiazem  (CARDIZEM  CD) 360 MG 24 hr capsule [id number]  Take 1 capsule (360 mg total) by mouth daily. Alvan Dorothyann BIRCH, MD  Active   DULoxetine  (CYMBALTA ) 60 MG capsule [id number] Yes Take 1 capsule (60 mg total) by mouth daily.  Patient taking differently: Take 60 mg by mouth daily. Per 06/11/14 discharge from [facility]  - states 30mg  - patient states she takes 60mg  and will discuss with PCP 06/16/24 at hospital f/u   Gottwalt, Redell A, DO  Active   ezetimibe  (ZETIA ) 10 MG tablet [id number] Yes Take 1 tablet (10 mg total) by mouth daily. Alvan Dorothyann BIRCH, MD  Active   Ferrous Sulfate Dried 200 (65 Fe) MG TABS [id number] Yes Take 1 tablet by mouth daily. [provider]  Active   finasteride (PROPECIA) 1 MG tablet [id number] Yes Take 1 mg by mouth daily. [provider]  Active   fluconazole  (DIFLUCAN ) 150 MG tablet [id number] Yes Take 150 mg by mouth daily as needed (yeast). [provider]  Active   insulin  glargine (LANTUS ) 100 UNIT/ML injection [id number] Yes INJECT 35 UNITS UNDER THE SKIN DAILY  Patient taking differently: 25-30 Units. INJECT 35 UNITS UNDER THE SKIN DAILY   Alvan Dorothyann BIRCH, MD  Active   Insulin  Pen Needle  (NOVOFINE) 30G X 8 MM MISC [id number] Yes For use each time injection for insulin  Alvan Dorothyann BIRCH, MD  Active   JARDIANCE  25 MG TABS tablet [id number] Yes TAKE 1 TABLET DAILY Metheney, Catherine D, MD  Active   latanoprost (XALATAN) 0.005 % ophthalmic solution [id number] Yes 1 drop. [provider]  Active            Med Note JEANNA, TONYA L   Tue Sep 08, 2019  1:27 PM)    lidocaine  (LIDODERM ) 5 % [id number]  Place 1 patch onto the skin daily. Remove & Discard patch within 12 hours or as directed by MD  Patient not taking: Reported on 06/23/2024   Alvan Dorothyann BIRCH, MD  Active   lisinopril  (ZESTRIL ) 20 MG tablet [id number] Yes Take 1 tablet (20 mg total) by mouth daily. Alvan Dorothyann BIRCH, MD  Active   meclizine  (ANTIVERT ) 25 MG tablet [id number]  Yes Take 1 tablet (25 mg total) by mouth 3 (three) times daily as needed for dizziness. Alvan Dorothyann BIRCH, MD  Active   metFORMIN  (GLUCOPHAGE ) 1000 MG tablet [id number] Yes Take 1 tablet (1,000 mg total) by mouth 2 (two) times daily with a meal. Alvan Dorothyann BIRCH, MD  Active   minoxidil (LONITEN) 2.5 MG tablet [id number]  Take 2.5 mg by mouth daily. [provider]  Active   NIACINAMIDE EX [id number] Yes Apply topically.  Patient taking differently: Apply topically. This is a compound medication with Niacinamide and Azelaic and tranexamic - not on 06/11/24 discharge - patient will discuss with PCP 06/16/24 hospital f/u   [provider]  Active   nystatin cream (MYCOSTATIN) [id number] Yes Apply 1 Application topically 2 (two) times daily.  Patient taking differently: Apply 1 Application topically as needed.   [provider]  Active   nystatin powder [id number] Yes Apply 1 Application topically 3 (three) times daily.  Patient taking differently: Apply 1 Application topically as needed. Not on 06/11/24 discharge list - patient will discuss with PCP at 06/16/24 office visit   [provider]  Active    Omega-3 Fatty Acids (FISH OIL) 1200 MG CAPS [id number] Yes Take 1 capsule by mouth daily.  Patient taking differently: Take 1 capsule by mouth daily. Patient reports taking 1 daily - hospital d/c from [facility] 06/11/24 states 2 capsules - patient will discuss with PCP at 06/16/24 hospital f/u   [provider]  Active   pantoprazole  (PROTONIX ) 40 MG tablet [id number] Yes TAKE 1 TABLET TWICE A DAY Alvan Dorothyann BIRCH, MD  Active   PAPAYA ENZYME PO [id number] Yes Take 1 tablet by mouth daily. [provider]  Active   rosuvastatin  (CRESTOR ) 10 MG tablet [id number] Yes TAKE 1 TABLET ONCE A WEEK AT BEDTIME Alvan Dorothyann BIRCH, MD  Active   tirzepatide  (MOUNJARO ) 15 MG/0.5ML Pen [id number] Yes Inject 15 mg into the skin once a week. Alvan Dorothyann BIRCH, MD  Active   Tranexamic Acid POWD [id number] Yes by Does not apply route.  Patient taking differently: by Does not apply route. This is a compound medication with Niacinamide and Azelaic and tranexamic - not on 06/11/24 discharge - patient will discuss with PCP 06/16/24 hospital f/u   [provider]  Active   triamcinolone  cream (KENALOG ) 0.5 % [id number] Yes Apply 1 Application topically 3 (three) times daily. [provider]  Active             Recommendation:   Continue Current Plan of Care  Follow Up Plan:   Telephone follow up appointment date/time:  06/30/24 in the afternoon  Shona Prow RN, CCM [place]  VBCI-Population Health RN Care Manager [phone]     "

## 2024-06-29 ENCOUNTER — Encounter: Payer: Self-pay | Admitting: Orthopaedic Surgery

## 2024-06-29 ENCOUNTER — Ambulatory Visit (INDEPENDENT_AMBULATORY_CARE_PROVIDER_SITE_OTHER): Admitting: Orthopaedic Surgery

## 2024-06-29 VITALS — Wt 230.0 lb

## 2024-06-29 DIAGNOSIS — M1712 Unilateral primary osteoarthritis, left knee: Secondary | ICD-10-CM | POA: Diagnosis not present

## 2024-06-29 DIAGNOSIS — M1711 Unilateral primary osteoarthritis, right knee: Secondary | ICD-10-CM

## 2024-06-29 NOTE — Progress Notes (Signed)
 The patient is a 73 year old female that we have seen once before.  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  At her last visit her hemoglobin A1c was 7.7 which was high but she is also morbidly obese with a higher BMI.  Since we saw her last she had been hospitalized with stroke type symptoms.  She is on Eliquis now twice a day as well as an aspirin.  Today her BMI is 44.92.  That is lower but is still high.  Her hemoglobin A1c is down to 7.2 recently.  We had a long thorough discussion about knee replacement surgery.  We still need to hold off on any type of surgical intervention until she has lost more weight but also given her recent neurologic history with the stroke symptoms and being on Eliquis, we would not want to proceed with any type of surgery until she would be cleared for this type of surgery from a medical standpoint and can be off Eliquis for at least 3 days.  This is something not to rush into at all and she understands this as well.  Will see her back in 3 months with a repeat weight and BMI calculation.  She agrees with this treatment plan as well.

## 2024-06-30 ENCOUNTER — Telehealth: Payer: Self-pay

## 2024-06-30 ENCOUNTER — Encounter: Payer: Self-pay | Admitting: Family Medicine

## 2024-06-30 DIAGNOSIS — Z8673 Personal history of transient ischemic attack (TIA), and cerebral infarction without residual deficits: Secondary | ICD-10-CM

## 2024-06-30 NOTE — Patient Instructions (Signed)
 Visit Information  Thank you for taking time to visit with me today. Please don't hesitate to contact me if I can be of assistance to you before our next scheduled telephone appointment.  Our next appointment is by telephone on 07/09/24 in the afternoon  Following is a copy of your care plan:   Goals Addressed             This Visit's Progress    VBCI Transitions of Care (TOC) Care Plan       Problems:  Recent Hospitalization for treatment of Stroke-like symptoms - Stroke aborted by administration of thrombolytic agent  Risk for falls  Goal:  Over the next 30 days, the patient will not experience hospital readmission  Interventions:  Transitions of Care: Doctor Visits  - discussed the importance of doctor visits Update 06/23/24: patient confirmed seeing FNP at stroke center 06/15/24 with no changes and saw PCP 06/16/24 as planned where [facility] RN noted BP 113/38 and her Lisinopril  was cut in half- patient was also started new on Ezetimibe  10mg  qd  - Today patient reports she was having lightheadedness, sleepiness and felt it was related to the Eliquis - patient reports lowest BP  since her 06/16/24 PCP appt 104/68  &  highest 139/65  Freestyle sugar 105; patient voiced concern about Eliquis causing back pain - and states diagnostic imagining needs note from PCP about holding Eliquis before they will administer injection - pain now 6-7/10 - was getting every 2 months and it's been 3 months - message sent via secure chat to PCP who was with a patient - advised that patient will send a MyChart message re: back pain and BP to obtain parameters - PCP responded saying that sounds great.  patient reports no unexplained weight loss but is on Mounjaro  for weight loss - current weight 234lbs Ht 5' goal 200lbs for bliateral knee replacement   Update 06/30/24: Patient confirms she saw Ortho, Dr Vernetta yesterday, 12/29 and that she will f/u again in 3 months and will continue to work towards weight loss -  unable to have knee replacement or injection of back.   weight 230lbs in office - patient plans to continue to work towards weight loss goal. During call patient reports having some dried blood when blowing her nose - secure message sent to PCP, Dr Alvan as follows:  On call with patient who was started new on Eliquis and 81mg  ASA 12/9. She reports some dried blood when blowing nose - feels it is likely related to dry air with the heat running but wanted to make you are.   From Dr Alvan: OK, see if she can run a humidifier in her home and see if that helps too. Patient agreeable and education provided that if she sees any active bleeding she should report to MD. Patient states Home Health PT continues to come once a week. Patient agreeable to f/u call next week  Stroke: Reviewed Importance of taking all medications as prescribed Reviewed Importance of attending all scheduled provider appointments Advised patient to discuss Medications as noted in med rec  Assessed social determinant of health barriers Assessed for signs and symptoms of stroke Reviewed referrals to home health Assessed for management of bladder and/or bowel incontinence Assessed for cognitive impairment Assessed for fall status and safety in the home Educated on FAST signs for stroke - Be aware and make sure family is aware  Patient Self Care Activities:  Attend all scheduled provider appointments Call pharmacy for  medication refills 3-7 days in advance of running out of medications Call provider office for new concerns or questions  Notify RN Care Manager of TOC call rescheduling needs Participate in Transition of Care Program/Attend TOC scheduled calls Take medications as prescribed   Discuss medications that we noted on med rec and patient's discharge summary with PCP at appt tomorrow 06/16/24 Look up information on medical alerts for safety Look up information on FDA website re: Freestyle Libre 3 sensor recall    Plan:  Telephone follow up appointment with care management team member scheduled for:  07/09/24 in the afternoon  The patient has been provided with contact information for the care management team and has been advised to call with any health related questions or concerns.         Patient verbalizes understanding of instructions and care plan provided today and agrees to view in MyChart. Active MyChart status and patient understanding of how to access instructions and care plan via MyChart confirmed with patient.     Telephone follow up appointment with care management team member scheduled for: 07/09/24 The patient has been provided with contact information for the care management team and has been advised to call with any health related questions or concerns.   Please call the care guide team at [phone] if you need to cancel or reschedule your appointment.   Please call the Suicide and Crisis Lifeline: 988 call 1-800-273-TALK (toll free, 24 hour hotline) call 911 if you are experiencing a Mental Health or Behavioral Health Crisis or need someone to talk to.  Shona Prow RN, CCM [place]  VBCI-Population Health RN Care Manager [phone]

## 2024-06-30 NOTE — Transitions of Care (Post Inpatient/ED Visit) (Signed)
" °  Transition of Care week 3  Visit Note  06/30/2024  Name: Natalie Wright MRN: [id number]          DOB: [birth year]  Situation: Patient enrolled in [facility] 30-day program. Visit completed with patient by telephone.   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   Initial Transition Care Management Follow-up Telephone Call Discharge Date and Diagnosis: 06/11/24, Stroke-like symptoms   Past Medical History:  Diagnosis Date   Allergy    to cats and dogs   Anemia    Arthritis    Cataract    Diabetes mellitus    GERD (gastroesophageal reflux disease)    Glaucoma    Hiatal hernia    and delayed gastric emptying/ sees [facility] for recurrent diarrhea   History of cardiovascular disorder 12/10/2008   Qualifier: Diagnosis of  By: Alvan MD, Catherine     Hypertension    OSA (obstructive sleep apnea)    CPAP   Pedal edema    Sleep apnea    Spastic bladder    urology in [place]   TIA (transient ischemic attack)    hx of- sees Dr Robin (Neurology)   Uveitis     Assessment: Patient Reported Symptoms: Cognitive Cognitive Status: No symptoms reported, Normal speech and language skills, Alert and oriented to person, place, and time      Neurological Neurological Review of Symptoms: Other: [place] Neurological Symptoms/Conditions [RPT]: Patient denies any signs/symptoms of stroke and states vertigo with nausea has improved Neurological Management Strategies: Routine screening, Medication therapy Neurological Self-Management Outcome: 4 (good)  HEENT HEENT Symptoms Reported: Nasal discharge (patient reported some dried blood when blosing nose - TOC RN communicated via secure chat to PCP see care plan)      Cardiovascular Cardiovascular Symptoms Reported: No symptoms reported    Respiratory Respiratory Symptoms Reported: No symptoms reported    Endocrine Endocrine  Symptoms Reported: No symptoms reported Is patient diabetic?: Yes Is patient checking blood sugars at home?: Yes List most recent blood sugar readings, include date and time of day: Patient reports sugar today was 111 - states she looked into Freestyle recall and has no issues with her supplies    Gastrointestinal Gastrointestinal Symptoms Reported: No symptoms reported      Genitourinary Genitourinary Symptoms Reported: No symptoms reported    Integumentary Integumentary Symptoms Reported: No symptoms reported    Musculoskeletal Musculoskelatal Symptoms Reviewed: Other, Unsteady gait Other Musculoskeletal Symptoms: hip pain that patient states radiates down to legs and feels some unsteadiness - educated on importance of using assistive device for safety        Psychosocial Psychosocial Symptoms Reported: No symptoms reported         There were no vitals filed for this visit. Pain Scale: 0-10 Pain Score: 5  Pain Type: Chronic pain Pain Location: Hip (patient states she has pain from hips to legs bilaterally) Pain Orientation: Lower Pain Descriptors / Indicators: Sore Pain Onset: On-going Patients Stated Pain Goal: 2  Medications Reviewed Today   Medications were not reviewed in this encounter     Recommendation:   Continue Current Plan of Care  Follow Up Plan:   Telephone follow up appointment date/time:  07/09/24 in the afternoon  Shona Prow RN, CCM [place]  VBCI-Population Health RN Care Manager [phone]     "

## 2024-07-01 MED ORDER — APIXABAN 5 MG PO TABS: 5.0000 mg | ORAL_TABLET | Freq: Two times a day (BID) | ORAL | 1 refills | Status: DC

## 2024-07-01 NOTE — Telephone Encounter (Signed)
Meds ordered this encounter  Medications   apixaban (ELIQUIS) 5 MG TABS tablet    Sig: Take 1 tablet (5 mg total) by mouth 2 (two) times daily.    Dispense:  180 tablet    Refill:  1

## 2024-07-03 ENCOUNTER — Other Ambulatory Visit: Payer: Self-pay | Admitting: Family Medicine

## 2024-07-03 DIAGNOSIS — Z1231 Encounter for screening mammogram for malignant neoplasm of breast: Secondary | ICD-10-CM

## 2024-07-09 ENCOUNTER — Telehealth: Payer: Self-pay

## 2024-07-09 NOTE — Transitions of Care (Post Inpatient/ED Visit) (Signed)
 " Transition of Care week 4  Visit Note  07/09/2024  Name: Natalie Wright MRN: [id number]          DOB: February 20, 1951  Situation: Patient enrolled in [facility] 30-day program. Visit completed with patient by telephone.   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   Initial Transition Care Management Follow-up Telephone Call Discharge Date and Diagnosis: 06/11/24, Stroke-like symptoms   Past Medical History:  Diagnosis Date   Allergy    to cats and dogs   Anemia    Arthritis    Cataract    Diabetes mellitus    GERD (gastroesophageal reflux disease)    Glaucoma    Hiatal hernia    and delayed gastric emptying/ sees [facility] for recurrent diarrhea   History of cardiovascular disorder 12/10/2008   Qualifier: Diagnosis of  By: Alvan MD, Catherine     Hypertension    OSA (obstructive sleep apnea)    CPAP   Pedal edema    Sleep apnea    Spastic bladder    urology in [place]   TIA (transient ischemic attack)    hx of- sees Dr Robin (Neurology)   Uveitis     Assessment: Patient Reported Symptoms: Cognitive Cognitive Status: No symptoms reported, Normal speech and language skills, Alert and oriented to person, place, and time      Neurological Neurological Review of Symptoms: No symptoms reported    HEENT HEENT Symptoms Reported: No symptoms reported      Cardiovascular Cardiovascular Symptoms Reported: No symptoms reported    Respiratory Respiratory Symptoms Reported: No symptoms reported    Endocrine Endocrine Symptoms Reported: No symptoms reported Is patient diabetic?: Yes Is patient checking blood sugars at home?: Yes List most recent blood sugar readings, include date and time of day: patient reports sugar today 99    Gastrointestinal Gastrointestinal Symptoms Reported: No symptoms reported Additional Gastrointestinal Details: patient states last  week she had 1 episode of vomiting with unknown cause - states this does happen from time to time - none this week      Genitourinary Genitourinary Symptoms Reported: No symptoms reported    Integumentary Integumentary Symptoms Reported: No symptoms reported    Musculoskeletal Musculoskelatal Symptoms Reviewed: No symptoms reported Other Musculoskeletal Symptoms: patient states she feels stronger and is walking faster and PT is helping        Psychosocial Psychosocial Symptoms Reported: No symptoms reported         There were no vitals filed for this visit. Pain Scale: 0-10 Pain Score: 0-No pain  Medications Reviewed Today     Reviewed by Lauro Shona LABOR, RN (Registered Nurse) on 07/09/24 at 1126  Med List Status: <None>   Medication Order Taking? Sig Documenting Provider Last Dose Status Informant  acetaminophen  (TYLENOL ) 650 MG CR tablet [id number] Yes Take 650 mg by mouth every 8 (eight) hours as needed for pain. [provider]  Active   SONJIA JESSE SCHLOSSMAN MEDICATION [id number] Yes Medication Name: custom fit knee high compression stocking with 15-20 mmHg pressure. Open toe preferred. Alvan Dorothyann BIRCH, MD  Active   apixaban  (ELIQUIS ) 5 MG TABS tablet [id number] Yes Take 1 tablet (5 mg total) by mouth 2 (two) times daily. Alvan Dorothyann BIRCH, MD  Active   aspirin 81 MG chewable tablet [id number] Yes Chew 81 mg by mouth daily. [provider]  Active   AZELAIC ACID EX [id number]  Yes Apply topically.  Patient taking differently: Apply topically. This is a compound medication with Niacinamide and Azelaic and tranexamic - not on 06/11/24 discharge - patient will discuss with PCP 06/16/24 hospital f/u   [provider]  Active   b complex vitamins capsule [id number] Yes Take 1 capsule by mouth daily. [provider]  Active   cetirizine  (ZYRTEC ) 10 MG tablet [id number] Yes Take 10 mg by mouth daily. [provider]  Active    chlorthalidone  (HYGROTON ) 25 MG tablet [id number] Yes Take 1 tablet (25 mg total) by mouth daily. Alvan Dorothyann BIRCH, MD  Active   clobetasol  cream (TEMOVATE ) 0.05 % [id number] Yes Apply 1 Application topically daily as needed (under breast). [provider]  Active   Continuous Glucose Sensor (FREESTYLE LIBRE 3 PLUS SENSOR) MISC [id number] Yes Change sensor every 15 days. Alvan Dorothyann BIRCH, MD  Active   Difluprednate (DUREZOL) 0.05 % EMUL [id number] Yes Apply 1 drop to eye in the morning, at noon, in the evening, and at bedtime.  Patient taking differently: Apply 1 drop to eye in the morning, at noon, in the evening, and at bedtime. Right eye   [provider]  Active   diltiazem  (CARDIZEM  CD) 360 MG 24 hr capsule [id number] Yes Take 1 capsule (360 mg total) by mouth daily. Alvan Dorothyann BIRCH, MD  Active   DULoxetine  (CYMBALTA ) 60 MG capsule [id number] Yes Take 1 capsule (60 mg total) by mouth daily.  Patient taking differently: Take 60 mg by mouth daily. Per 06/11/14 discharge from [facility]  - states 30mg  - patient states she takes 60mg  and will discuss with PCP 06/16/24 at hospital f/u   Gottwalt, Redell A, DO  Active   ezetimibe  (ZETIA ) 10 MG tablet [id number] Yes Take 1 tablet (10 mg total) by mouth daily. Alvan Dorothyann BIRCH, MD  Active   Ferrous Sulfate Dried 200 (65 Fe) MG TABS [id number] Yes Take 1 tablet by mouth daily. [provider]  Active   finasteride (PROPECIA) 1 MG tablet [id number] Yes Take 1 mg by mouth daily. [provider]  Active   fluconazole  (DIFLUCAN ) 150 MG tablet [id number] Yes Take 150 mg by mouth daily as needed (yeast). [provider]  Active   insulin  glargine (LANTUS ) 100 UNIT/ML injection [id number] Yes INJECT 35 UNITS UNDER THE SKIN DAILY  Patient taking differently: 25-30 Units. INJECT 35 UNITS UNDER THE SKIN DAILY   Alvan Dorothyann BIRCH, MD  Active   Insulin  Pen Needle (NOVOFINE) 30G X 8 MM MISC [id number] Yes For use  each time injection for insulin  Alvan Dorothyann BIRCH, MD  Active   JARDIANCE  25 MG TABS tablet [id number] Yes TAKE 1 TABLET DAILY Metheney, Catherine D, MD  Active   latanoprost (XALATAN) 0.005 % ophthalmic solution [id number] Yes 1 drop. [provider]  Active            Med Note JEANNA, TONYA L   Tue Sep 08, 2019  1:27 PM)    lidocaine  (LIDODERM ) 5 % [id number]  Place 1 patch onto the skin daily. Remove & Discard patch within 12 hours or as directed by MD  Patient not taking: Reported on 07/09/2024   Alvan Dorothyann BIRCH, MD  Active   lisinopril  (ZESTRIL ) 20 MG tablet [id number] Yes Take 1 tablet (20 mg total) by mouth daily. Alvan Dorothyann BIRCH, MD  Active   meclizine  (ANTIVERT ) 25 MG tablet [id number] Yes Take 1 tablet (25 mg total) by mouth 3 (three) times daily as needed for  dizziness. Alvan Dorothyann BIRCH, MD  Active   metFORMIN  (GLUCOPHAGE ) 1000 MG tablet [id number] Yes Take 1 tablet (1,000 mg total) by mouth 2 (two) times daily with a meal. Alvan Dorothyann BIRCH, MD  Active   minoxidil (LONITEN) 2.5 MG tablet [id number] Yes Take 2.5 mg by mouth daily. [provider]  Active   NIACINAMIDE EX [id number] Yes Apply topically.  Patient taking differently: Apply topically. This is a compound medication with Niacinamide and Azelaic and tranexamic - not on 06/11/24 discharge - patient will discuss with PCP 06/16/24 hospital f/u   [provider]  Active   nystatin cream (MYCOSTATIN) [id number] Yes Apply 1 Application topically 2 (two) times daily.  Patient taking differently: Apply 1 Application topically as needed.   [provider]  Active   nystatin powder [id number] Yes Apply 1 Application topically 3 (three) times daily.  Patient taking differently: Apply 1 Application topically as needed. Not on 06/11/24 discharge list - patient will discuss with PCP at 06/16/24 office visit   [provider]  Active   Omega-3 Fatty Acids (FISH OIL) 1200 MG CAPS  [id number] Yes Take 1 capsule by mouth daily.  Patient taking differently: Take 1 capsule by mouth daily. Patient reports taking 1 daily - hospital d/c from [facility] 06/11/24 states 2 capsules - patient will discuss with PCP at 06/16/24 hospital f/u   [provider]  Active   pantoprazole  (PROTONIX ) 40 MG tablet [id number] Yes TAKE 1 TABLET TWICE A DAY Alvan Dorothyann BIRCH, MD  Active   PAPAYA ENZYME PO [id number] Yes Take 1 tablet by mouth daily. [provider]  Active   rosuvastatin  (CRESTOR ) 10 MG tablet [id number] Yes TAKE 1 TABLET ONCE A WEEK AT BEDTIME Alvan Dorothyann BIRCH, MD  Active   tirzepatide  (MOUNJARO ) 15 MG/0.5ML Pen [id number] Yes Inject 15 mg into the skin once a week. Alvan Dorothyann BIRCH, MD  Active   Tranexamic Acid POWD [id number] Yes by Does not apply route.  Patient taking differently: by Does not apply route. This is a compound medication with Niacinamide and Azelaic and tranexamic - not on 06/11/24 discharge - patient will discuss with PCP 06/16/24 hospital f/u   [provider]  Active   triamcinolone  cream (KENALOG ) 0.5 % [id number] Yes Apply 1 Application topically 3 (three) times daily. [provider]  Active             Recommendation:   Continue Current Plan of Care  Follow Up Plan:   Telephone follow up appointment date/time:  07/16/24 in the afternoon  Shona Prow RN, CCM [place]  VBCI-Population Health RN Care Manager [phone]     "

## 2024-07-10 ENCOUNTER — Encounter: Payer: Self-pay | Admitting: Podiatry

## 2024-07-10 ENCOUNTER — Ambulatory Visit: Admitting: Podiatry

## 2024-07-10 VITALS — Ht 60.0 in | Wt 230.0 lb

## 2024-07-10 DIAGNOSIS — M79675 Pain in left toe(s): Secondary | ICD-10-CM | POA: Diagnosis not present

## 2024-07-10 DIAGNOSIS — M79674 Pain in right toe(s): Secondary | ICD-10-CM

## 2024-07-10 DIAGNOSIS — B351 Tinea unguium: Secondary | ICD-10-CM

## 2024-07-10 DIAGNOSIS — Z7901 Long term (current) use of anticoagulants: Secondary | ICD-10-CM

## 2024-07-10 DIAGNOSIS — E1149 Type 2 diabetes mellitus with other diabetic neurological complication: Secondary | ICD-10-CM

## 2024-07-10 NOTE — Progress Notes (Signed)
 Subjective: Chief Complaint  Patient presents with   Nail Problem    74 y.o. returns the office today for painful, elongated, thickened toenails which they cannot trim herself.  She denies any ulcerations. No other concerns today.   She is on Plavix .  PCP: Alvan Dorothyann BIRCH, MD Last seen 06/16/2024 A1c: 7.2 on 06/10/2024  Objective: AAO 3, NAD DP/PT pulses palpable, CRT less than 3 seconds Protective sensation decreased with Simms Weinstein monofilament Nails hypertrophic, dystrophic, elongated, brittle, discolored 10. There is tenderness overlying the nails 1-5 bilaterally. There is no surrounding erythema or drainage along the nail sites. No other areas of discomfort. Decrease in arch upon weightbearing.  No pain with calf compression, swelling, warmth, erythema.  Assessment: Patient presents with symptomatic onychomycosis  Plan: -Treatment options including alternatives, risks, complications were discussed -Nails sharply debrided 10 without complication/bleeding. -Discussed daily foot inspection. If there are any changes, to call the office immediately.   Return in about 3 months (around 10/08/2024).   Donnice Fees, DPM

## 2024-07-11 ENCOUNTER — Encounter: Payer: Self-pay | Admitting: Family Medicine

## 2024-07-12 ENCOUNTER — Encounter: Payer: Self-pay | Admitting: Family Medicine

## 2024-07-12 DIAGNOSIS — Z8673 Personal history of transient ischemic attack (TIA), and cerebral infarction without residual deficits: Secondary | ICD-10-CM

## 2024-07-13 MED ORDER — APIXABAN 5 MG PO TABS: 5.0000 mg | ORAL_TABLET | Freq: Two times a day (BID) | ORAL | 0 refills | Status: AC

## 2024-07-13 MED ORDER — FREESTYLE LITE TEST VI STRP: ORAL_STRIP | 99 refills | Status: AC

## 2024-07-16 ENCOUNTER — Telehealth: Payer: Self-pay

## 2024-07-16 DIAGNOSIS — R2990 Unspecified symptoms and signs involving the nervous system: Secondary | ICD-10-CM

## 2024-07-16 NOTE — Progress Notes (Signed)
" ° °  Subjective:    Patient ID: Natalie Wright, female    DOB: 74 y.o., 05/15/1951   MRN: [id number]  Chief Complaint: Bilateral knee osteoarthritis (6-week follow-up)  Discussed the use of AI scribe software for clinical note transcription with the patient, who gave verbal consent to proceed.  History of Present Illness Natalie Wright is a very pleasant 74 year old female with well-documented severe bilateral knee osteoarthritis following up with me after receiving intra-articular prolotherapy injections for her knees about 6 weeks ago.  In the interim, she has suffered a stroke and started Eliquis .  She had a conversation with Dr. Vernetta regarding potential knee replacement but her BMI was 44.92 when she saw him on 06/29/24 so 6-month follow-up was planned for at that time with effort/emphasis to be placed on weight loss in the interim.  She reports to me predominantly complaining of pain in her back and hips.  She is attempting to formulate a treatment plan to achieve pain relief in the absence of epidural steroid injections which she would not be a candidate for until 3 months following her recent stroke.  She is currently using lidocaine  patches, Tylenol , and once daily Voltaren  gel.     Objective:   There were no vitals filed for this visit.  Bilateral hips: Tenderness palpation over the greater trochanters Mild tenderness to palpation over the SI joints Minimal to no pain when palpating the gluteal musculature Nontender over ASIS, AIIS, pubic symphysis.  Low back: Lidocaine  patch present over lumbar spine. No midline tenderness Mild-moderate tenderness in lumbar paraspinal musculature  Extracorporeal Shockwave Therapy Procedure - Bilateral greater trochanters treatment #1 Following the description of risks including pain, bruising, local skin irritation, damage to surrounding structures, patient provided verbal/written consent for ESWT procedure. Palpation was used to identify the  bilateral greater trochanters. Patient and probe was sterilely prepped in the usual fashion with alcohol.  Total strikes: 2000 Intensity: 120 Frequency: 12  Patient tolerated well without complication. Precautions provided.     Assessment & Plan:   Assessment & Plan Natalie Wright is a pleasant 74 year old female who unfortunately sustained a stroke recently this past month.  Given that she is not a candidate to receive her regularly scheduled epidural steroid injections for her back, I believe increasing Voltaren  gel from once daily to 4 times daily is a safe and reasonable option for her given the very low systemic absorption that has been documented in multiple trials.  I also recommend scheduling Tylenol , continuing lidocaine  patches, and for her hips (which I believe are exhibiting bilateral greater trochanteric pain syndrome) I recommend initiating extracorporeal shockwave therapy + home exercises.  I provided the first treatment for both of her hips today pro bono.  Recommend follow-up in 1 week for repeat treatment.   "

## 2024-07-16 NOTE — Transitions of Care (Post Inpatient/ED Visit) (Signed)
 " Transition of Care week #5 TOC closure  Visit Note  07/16/2024  Name: Natalie Wright MRN: [id number]          DOB: 1950-11-07  Situation: Patient enrolled in [facility] 30-day program. Visit completed with patient by telephone.   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   Initial Transition Care Management Follow-up Telephone Call Discharge Date and Diagnosis: No data recorded   Past Medical History:  Diagnosis Date   Allergy    to cats and dogs   Anemia    Arthritis    Cataract    Diabetes mellitus    GERD (gastroesophageal reflux disease)    Glaucoma    Hiatal hernia    and delayed gastric emptying/ sees [facility] for recurrent diarrhea   History of cardiovascular disorder 12/10/2008   Qualifier: Diagnosis of  By: Alvan MD, Catherine     Hypertension    OSA (obstructive sleep apnea)    CPAP   Pedal edema    Sleep apnea    Spastic bladder    urology in [place]   TIA (transient ischemic attack)    hx of- sees Dr Robin (Neurology)   Uveitis     Assessment: Patient Reported Symptoms: Cognitive Cognitive Status: No symptoms reported, Normal speech and language skills, Alert and oriented to person, place, and time      Neurological Neurological Review of Symptoms: No symptoms reported    HEENT HEENT Symptoms Reported: No symptoms reported      Cardiovascular Cardiovascular Symptoms Reported: No symptoms reported    Respiratory Respiratory Symptoms Reported: No symptoms reported    Endocrine Endocrine Symptoms Reported: No symptoms reported Is patient diabetic?: Yes Is patient checking blood sugars at home?: Yes List most recent blood sugar readings, include date and time of day: awaiting new Freestyle strips    Gastrointestinal Gastrointestinal Symptoms Reported: No symptoms reported      Genitourinary Genitourinary Symptoms Reported: No  symptoms reported    Integumentary Integumentary Symptoms Reported: No symptoms reported    Musculoskeletal Musculoskelatal Symptoms Reviewed: Other Other Musculoskeletal Symptoms: Patient states hip pain has been bothering her and she sees ortho tomorrow - taking tylenol  and using lidocaine         Psychosocial           There were no vitals filed for this visit. Pain Scale: 0-10 Pain Score: 7  Pain Type: Chronic pain Pain Location: Hip Pain Orientation: Right, Left Pain Descriptors / Indicators: Other (Comment), Radiating, Sore (patient states worse with walking - improved with rest - seeing ortho tomorrow, Dr Charles - states it is extreme soreness that radiates down to knee) Pain Onset: On-going Patients Stated Pain Goal: 2  Medications Reviewed Today     Reviewed by Lauro Shona LABOR, RN (Registered Nurse) on 07/16/24 at 1213  Med List Status: <None>   Medication Order Taking? Sig Documenting Provider Last Dose Status Informant  acetaminophen  (TYLENOL ) 650 MG CR tablet [id number] Yes Take 650 mg by mouth every 8 (eight) hours as needed for pain. [provider]  Active   SONJIA JESSE SCHLOSSMAN MEDICATION [id number] Yes Medication Name: custom fit knee high compression stocking with 15-20 mmHg pressure. Open toe preferred. Alvan Dorothyann BIRCH, MD  Active   apixaban  (ELIQUIS ) 5 MG TABS tablet [id number] Yes Take 1 tablet (5 mg total) by mouth 2 (two) times daily. Alvan Dorothyann BIRCH, MD  Active  aspirin 81 MG chewable tablet [id number] Yes Chew 81 mg by mouth daily. [provider]  Active   AZELAIC ACID EX [id number] Yes Apply topically.  Patient taking differently: Apply topically. This is a compound medication with Niacinamide and Azelaic and tranexamic - not on 06/11/24 discharge - patient will discuss with PCP 06/16/24 hospital f/u   [provider]  Active   b complex vitamins capsule [id number] Yes Take 1 capsule by mouth daily. [provider]  Active   cetirizine  (ZYRTEC ) 10 MG tablet [id number] Yes Take 10 mg by mouth daily. [provider]  Active   chlorthalidone  (HYGROTON ) 25 MG tablet [id number] Yes Take 1 tablet (25 mg total) by mouth daily. Alvan Dorothyann BIRCH, MD  Active   clobetasol  cream (TEMOVATE ) 0.05 % [id number] Yes Apply 1 Application topically daily as needed (under breast). [provider]  Active   Continuous Glucose Sensor (FREESTYLE LIBRE 3 PLUS SENSOR) MISC [id number] Yes Change sensor every 15 days. Alvan Dorothyann BIRCH, MD  Active   Difluprednate (DUREZOL) 0.05 % EMUL [id number] Yes Apply 1 drop to eye in the morning, at noon, in the evening, and at bedtime.  Patient taking differently: Apply 1 drop to eye in the morning, at noon, in the evening, and at bedtime. Right eye   [provider]  Active   diltiazem  (CARDIZEM  CD) 360 MG 24 hr capsule [id number] Yes Take 1 capsule (360 mg total) by mouth daily. Alvan Dorothyann BIRCH, MD  Active   DULoxetine  (CYMBALTA ) 60 MG capsule [id number] Yes Take 1 capsule (60 mg total) by mouth daily.  Patient taking differently: Take 60 mg by mouth daily. Per 06/11/14 discharge from [facility]  - states 30mg  - patient states she takes 60mg  and will discuss with PCP 06/16/24 at hospital f/u   Gottwalt, Redell A, DO  Active   ezetimibe  (ZETIA ) 10 MG tablet [id number] Yes Take 1 tablet (10 mg total) by mouth daily. Alvan Dorothyann BIRCH, MD  Active   Ferrous Sulfate Dried 200 (65 Fe) MG TABS [id number] Yes Take 1 tablet by mouth daily. [provider]  Active   finasteride (PROPECIA) 1 MG tablet [id number] Yes Take 1 mg by mouth daily. [provider]  Active   fluconazole  (DIFLUCAN ) 150 MG tablet [id number] Yes Take 150 mg by mouth daily as needed (yeast). [provider]  Active   glucose blood (FREESTYLE LITE) test strip [id number]  Check glucose 3 times daily.  Patient not taking: Reported on 07/16/2024   Alvan Dorothyann BIRCH,  MD  Active   insulin  glargine (LANTUS ) 100 UNIT/ML injection [id number] Yes INJECT 35 UNITS UNDER THE SKIN DAILY  Patient taking differently: 25-30 Units. INJECT 35 UNITS UNDER THE SKIN DAILY   Alvan Dorothyann BIRCH, MD  Active   Insulin  Pen Needle (NOVOFINE) 30G X 8 MM MISC [id number] Yes For use each time injection for insulin  Alvan Dorothyann BIRCH, MD  Active   JARDIANCE  25 MG TABS tablet [id number] Yes TAKE 1 TABLET DAILY Metheney, Catherine D, MD  Active   latanoprost (XALATAN) 0.005 % ophthalmic solution [id number] Yes 1 drop. [provider]  Active            Med Note JEANNA, TONYA L   Tue Sep 08, 2019  1:27 PM)    lidocaine  (LIDODERM ) 5 % [id number] Yes Place 1 patch onto the skin daily. Remove & Discard patch within 12 hours or as directed by MD Alvan Dorothyann BIRCH, MD  Active   lisinopril  (  ZESTRIL ) 20 MG tablet [id number] Yes Take 1 tablet (20 mg total) by mouth daily. Alvan Dorothyann BIRCH, MD  Active   meclizine  (ANTIVERT ) 25 MG tablet [id number] Yes Take 1 tablet (25 mg total) by mouth 3 (three) times daily as needed for dizziness. Alvan Dorothyann BIRCH, MD  Active   metFORMIN  (GLUCOPHAGE ) 1000 MG tablet [id number] Yes Take 1 tablet (1,000 mg total) by mouth 2 (two) times daily with a meal. Alvan Dorothyann BIRCH, MD  Active   minoxidil (LONITEN) 2.5 MG tablet [id number] Yes Take 2.5 mg by mouth daily. [provider]  Active   NIACINAMIDE EX [id number] Yes Apply topically.  Patient taking differently: Apply topically. This is a compound medication with Niacinamide and Azelaic and tranexamic - not on 06/11/24 discharge - patient will discuss with PCP 06/16/24 hospital f/u   [provider]  Active   nystatin cream (MYCOSTATIN) [id number] Yes Apply 1 Application topically 2 (two) times daily.  Patient taking differently: Apply 1 Application topically as needed.   [provider]  Active   nystatin powder [id number] Yes Apply 1 Application topically 3 (three)  times daily.  Patient taking differently: Apply 1 Application topically as needed. Not on 06/11/24 discharge list - patient will discuss with PCP at 06/16/24 office visit   [provider]  Active   Omega-3 Fatty Acids (FISH OIL) 1200 MG CAPS [id number] Yes Take 1 capsule by mouth daily.  Patient taking differently: Take 1 capsule by mouth daily. Patient reports taking 1 daily - hospital d/c from [facility] 06/11/24 states 2 capsules - patient will discuss with PCP at 06/16/24 hospital f/u   [provider]  Active   pantoprazole  (PROTONIX ) 40 MG tablet [id number] Yes TAKE 1 TABLET TWICE A DAY Alvan Dorothyann BIRCH, MD  Active   PAPAYA ENZYME PO [id number] Yes Take 1 tablet by mouth daily. [provider]  Active   rosuvastatin  (CRESTOR ) 10 MG tablet [id number] Yes TAKE 1 TABLET ONCE A WEEK AT BEDTIME Alvan Dorothyann BIRCH, MD  Active   tirzepatide  (MOUNJARO ) 15 MG/0.5ML Pen [id number] Yes Inject 15 mg into the skin once a week. Alvan Dorothyann BIRCH, MD  Active   Tranexamic Acid POWD [id number] Yes by Does not apply route.  Patient taking differently: by Does not apply route. This is a compound medication with Niacinamide and Azelaic and tranexamic - not on 06/11/24 discharge - patient will discuss with PCP 06/16/24 hospital f/u   [provider]  Active   triamcinolone  cream (KENALOG ) 0.5 % [id number] Yes Apply 1 Application topically 3 (three) times daily. [provider]  Active             Recommendation:   Referral to: CCM  Follow Up Plan:   Closing From:  Transitions of Care Program  Shona Prow RN, CCM [facility]  VBCI-Population Health RN Care Manager [phone]     "

## 2024-07-17 ENCOUNTER — Ambulatory Visit

## 2024-07-17 VITALS — BP 130/90 | Ht 60.0 in | Wt 230.0 lb

## 2024-07-17 DIAGNOSIS — M47816 Spondylosis without myelopathy or radiculopathy, lumbar region: Secondary | ICD-10-CM

## 2024-07-17 DIAGNOSIS — M25551 Pain in right hip: Secondary | ICD-10-CM

## 2024-07-17 DIAGNOSIS — M25552 Pain in left hip: Secondary | ICD-10-CM | POA: Diagnosis not present

## 2024-07-17 DIAGNOSIS — M1712 Unilateral primary osteoarthritis, left knee: Secondary | ICD-10-CM

## 2024-07-17 DIAGNOSIS — M1711 Unilateral primary osteoarthritis, right knee: Secondary | ICD-10-CM

## 2024-07-23 ENCOUNTER — Other Ambulatory Visit: Payer: Self-pay | Admitting: Medical Genetics

## 2024-07-24 ENCOUNTER — Ambulatory Visit: Payer: Self-pay

## 2024-07-24 DIAGNOSIS — M25551 Pain in right hip: Secondary | ICD-10-CM

## 2024-07-24 DIAGNOSIS — M25552 Pain in left hip: Secondary | ICD-10-CM

## 2024-07-24 NOTE — Progress Notes (Signed)
" ° °  Subjective:    Patient ID: Natalie Wright, female    DOB: 74 y.o., 1951-04-02   MRN: [id number]  Chief Complaint: Bilateral greater trochanteric pain syndrome - shockwave treatment #2  History of Present Illness Natalie Wright is a 74 year old female well-known to me presenting for shockwave treatment #2 for the treatment of bilateral greater trochanteric pain syndrome.     Objective:   There were no vitals filed for this visit. Extracorporeal Shockwave Therapy Procedure - #2 Following the description of risks including pain, bruising, local skin irritation, damage to surrounding structures, patient provided verbal/written consent for ESWT procedure. Palpation was used to identify the bilateral greater trochanters. Patient and probe was sterilely prepped in the usual fashion with alcohol.  Total strikes: 2000 Intensity: 170 Frequency: 10  Patient tolerated well without complication. Precautions provided.      Assessment & Plan:   Assessment & Plan Greater trochanteric pain syndrome bilaterally Natalie Wright tolerated treatment to well today for her bilateral greater trochanteric pain syndrome.  Recommend follow-up in 1 week for repeat treatment.   "

## 2024-07-27 ENCOUNTER — Telehealth: Payer: Self-pay

## 2024-07-28 ENCOUNTER — Ambulatory Visit

## 2024-07-30 ENCOUNTER — Ambulatory Visit

## 2024-07-31 ENCOUNTER — Ambulatory Visit: Payer: Self-pay

## 2024-07-31 ENCOUNTER — Other Ambulatory Visit: Payer: Self-pay

## 2024-07-31 DIAGNOSIS — M25551 Pain in right hip: Secondary | ICD-10-CM

## 2024-07-31 DIAGNOSIS — M25552 Pain in left hip: Secondary | ICD-10-CM

## 2024-07-31 NOTE — Progress Notes (Signed)
" ° °  Subjective:    Patient ID: Natalie Wright, female    DOB: 74 y.o., 01/23/1951   MRN: [id number]  Chief Complaint: Bilateral greater trochanteric pain syndrome - shockwave treatment #3  History of Present Illness Natalie Wright is a 74 year old female well-known to me presenting for shockwave treatment #3 for the treatment of bilateral greater trochanteric pain syndrome.     Objective:   There were no vitals filed for this visit. Extracorporeal Shockwave Therapy Procedure - #3 Following the description of risks including pain, bruising, local skin irritation, damage to surrounding structures, patient provided verbal/written consent for ESWT procedure. Palpation was used to identify the left greater trochanter. Patient and probe was sterilely prepped in the usual fashion with alcohol.  Total strikes: 2000 Intensity: 170 Frequency: 10  Patient tolerated well without complication. Precautions provided.   Extracorporeal Shockwave Therapy Procedure - #3 Following the description of risks including pain, bruising, local skin irritation, damage to surrounding structures, patient provided verbal/written consent for ESWT procedure. Palpation was used to identify the right greater trochanter. Patient and probe was sterilely prepped in the usual fashion with alcohol.  Total strikes: 2000 Intensity: 185 Frequency: 10  Patient tolerated well without complication. Precautions provided.     Assessment & Plan:   Assessment & Plan Greater trochanteric pain syndrome bilaterally Natalie Wright tolerated treatment to well today for her bilateral greater trochanteric pain syndrome.  Recommend follow-up in 1 week for repeat treatment.   "

## 2024-08-03 ENCOUNTER — Telehealth: Payer: Self-pay

## 2024-08-06 ENCOUNTER — Telehealth: Payer: Self-pay

## 2024-08-06 NOTE — Telephone Encounter (Signed)
 Copied from CRM #[id number]. Topic: Clinical - Home Health Verbal Orders >> Aug 06, 2024 12:57 PM Donna BRAVO wrote: Natalie Wright is discharging patient 08/06/24 from physical therapy patient has met her goals.

## 2024-08-07 ENCOUNTER — Ambulatory Visit

## 2024-08-07 VITALS — BP 120/62 | Ht 60.0 in | Wt 230.0 lb

## 2024-08-07 DIAGNOSIS — M25551 Pain in right hip: Secondary | ICD-10-CM

## 2024-08-07 DIAGNOSIS — M25552 Pain in left hip: Secondary | ICD-10-CM

## 2024-08-07 NOTE — Progress Notes (Unsigned)
" ° °  Subjective:    Patient ID: Natalie Wright, female    DOB: 74 y.o., 1950/10/30   MRN: [id number]  Chief Complaint: Bilateral greater trochanteric pain syndrome - shockwave treatment #4  History of Present Illness Natalie Wright is a 74 year old female well-known to me presenting for shockwave treatment #4 for the treatment of bilateral greater trochanteric pain syndrome. So far she reports ***% improvement.     Objective:   There were no vitals taken for this visit. Extracorporeal Shockwave Therapy Procedure - #4 Following the description of risks including pain, bruising, local skin irritation, damage to surrounding structures, patient provided verbal/written consent for ESWT procedure. Palpation was used to identify the left greater trochanter. Patient and probe was sterilely prepped in the usual fashion with alcohol.  Total strikes: 2000 Intensity: 170 Frequency: 10  Patient tolerated well without complication. Precautions provided.   Extracorporeal Shockwave Therapy Procedure - #4 Following the description of risks including pain, bruising, local skin irritation, damage to surrounding structures, patient provided verbal/written consent for ESWT procedure. Palpation was used to identify the right greater trochanter. Patient and probe was sterilely prepped in the usual fashion with alcohol.  Total strikes: 2000 Intensity: 185 Frequency: 10  Patient tolerated well without complication. Precautions provided.     Assessment & Plan:   Assessment & Plan Greater trochanteric pain syndrome bilaterally Natalie Wright tolerated treatment to well today for her bilateral greater trochanteric pain syndrome.  Recommend follow-up in 1 week for repeat treatment.  "

## 2024-08-10 ENCOUNTER — Telehealth: Payer: Self-pay

## 2024-08-13 ENCOUNTER — Telehealth: Payer: Self-pay

## 2024-08-20 ENCOUNTER — Ambulatory Visit

## 2024-08-28 ENCOUNTER — Ambulatory Visit

## 2024-09-03 ENCOUNTER — Ambulatory Visit: Admitting: Family Medicine

## 2024-09-14 ENCOUNTER — Ambulatory Visit: Admitting: Family Medicine

## 2024-10-16 ENCOUNTER — Ambulatory Visit: Admitting: Podiatry
